# Patient Record
Sex: Female | Born: 1950 | Race: Black or African American | Hispanic: No | Marital: Married | Smoking: Former smoker
Health system: Southern US, Community
[De-identification: ages and names within clinical notes are randomized; demographics above are authoritative.]

## PROBLEM LIST (undated history)

## (undated) DIAGNOSIS — G4730 Sleep apnea, unspecified: Secondary | ICD-10-CM

## (undated) DIAGNOSIS — J449 Chronic obstructive pulmonary disease, unspecified: Secondary | ICD-10-CM

## (undated) DIAGNOSIS — K449 Diaphragmatic hernia without obstruction or gangrene: Secondary | ICD-10-CM

## (undated) DIAGNOSIS — Z8719 Personal history of other diseases of the digestive system: Secondary | ICD-10-CM

## (undated) DIAGNOSIS — H539 Unspecified visual disturbance: Secondary | ICD-10-CM

## (undated) DIAGNOSIS — J309 Allergic rhinitis, unspecified: Secondary | ICD-10-CM

## (undated) DIAGNOSIS — F32A Depression, unspecified: Secondary | ICD-10-CM

## (undated) DIAGNOSIS — K5790 Diverticulosis of intestine, part unspecified, without perforation or abscess without bleeding: Secondary | ICD-10-CM

## (undated) DIAGNOSIS — D126 Benign neoplasm of colon, unspecified: Secondary | ICD-10-CM

## (undated) DIAGNOSIS — F419 Anxiety disorder, unspecified: Secondary | ICD-10-CM

## (undated) DIAGNOSIS — J330 Polyp of nasal cavity: Secondary | ICD-10-CM

## (undated) DIAGNOSIS — M1990 Unspecified osteoarthritis, unspecified site: Secondary | ICD-10-CM

## (undated) DIAGNOSIS — F329 Major depressive disorder, single episode, unspecified: Secondary | ICD-10-CM

## (undated) DIAGNOSIS — K219 Gastro-esophageal reflux disease without esophagitis: Secondary | ICD-10-CM

## (undated) DIAGNOSIS — Z9889 Other specified postprocedural states: Secondary | ICD-10-CM

## (undated) DIAGNOSIS — J45909 Unspecified asthma, uncomplicated: Secondary | ICD-10-CM

## (undated) DIAGNOSIS — J329 Chronic sinusitis, unspecified: Secondary | ICD-10-CM

## (undated) DIAGNOSIS — R112 Nausea with vomiting, unspecified: Secondary | ICD-10-CM

## (undated) DIAGNOSIS — T7840XA Allergy, unspecified, initial encounter: Secondary | ICD-10-CM

## (undated) HISTORY — PX: COLONOSCOPY: SHX174

## (undated) HISTORY — DX: Allergy, unspecified, initial encounter: T78.40XA

## (undated) HISTORY — DX: Depression, unspecified: F32.A

## (undated) HISTORY — PX: NASAL POLYP SURGERY: SHX186

## (undated) HISTORY — PX: POLYPECTOMY: SHX149

## (undated) HISTORY — DX: Diaphragmatic hernia without obstruction or gangrene: K44.9

## (undated) HISTORY — DX: Other specified postprocedural states: Z98.890

## (undated) HISTORY — DX: Major depressive disorder, single episode, unspecified: F32.9

## (undated) HISTORY — DX: Diverticulosis of intestine, part unspecified, without perforation or abscess without bleeding: K57.90

## (undated) HISTORY — DX: Unspecified asthma, uncomplicated: J45.909

## (undated) HISTORY — DX: Allergic rhinitis, unspecified: J30.9

## (undated) HISTORY — DX: Personal history of other diseases of the digestive system: Z87.19

## (undated) HISTORY — DX: Chronic obstructive pulmonary disease, unspecified: J44.9

## (undated) HISTORY — DX: Unspecified visual disturbance: H53.9

## (undated) HISTORY — DX: Benign neoplasm of colon, unspecified: D12.6

## (undated) HISTORY — PX: TONSILLECTOMY: SUR1361

## (undated) HISTORY — PX: BUNIONECTOMY: SHX129

## (undated) HISTORY — DX: Polyp of nasal cavity: J33.0

## (undated) HISTORY — DX: Chronic sinusitis, unspecified: J32.9

## (undated) HISTORY — PX: COLONOSCOPY W/ BIOPSIES AND POLYPECTOMY: SHX1376

## (undated) HISTORY — DX: Sleep apnea, unspecified: G47.30

## (undated) SURGERY — Surgical Case
Anesthesia: *Unknown

---

## 1996-06-18 ENCOUNTER — Encounter: Payer: Self-pay | Admitting: Gastroenterology

## 1996-08-07 ENCOUNTER — Encounter: Payer: Self-pay | Admitting: Gastroenterology

## 1997-12-15 ENCOUNTER — Emergency Department (HOSPITAL_COMMUNITY): Admission: EM | Admit: 1997-12-15 | Discharge: 1997-12-15 | Payer: Self-pay | Admitting: Emergency Medicine

## 1997-12-16 ENCOUNTER — Emergency Department (HOSPITAL_COMMUNITY): Admission: EM | Admit: 1997-12-16 | Discharge: 1997-12-16 | Payer: Self-pay | Admitting: Internal Medicine

## 1998-07-25 ENCOUNTER — Emergency Department (HOSPITAL_COMMUNITY): Admission: EM | Admit: 1998-07-25 | Discharge: 1998-07-25 | Payer: Self-pay | Admitting: Emergency Medicine

## 1998-07-25 ENCOUNTER — Encounter: Payer: Self-pay | Admitting: Emergency Medicine

## 1999-10-20 ENCOUNTER — Other Ambulatory Visit: Admission: RE | Admit: 1999-10-20 | Discharge: 1999-10-20 | Payer: Self-pay | Admitting: Obstetrics and Gynecology

## 2000-10-25 ENCOUNTER — Other Ambulatory Visit: Admission: RE | Admit: 2000-10-25 | Discharge: 2000-10-25 | Payer: Self-pay | Admitting: Obstetrics and Gynecology

## 2003-11-03 ENCOUNTER — Emergency Department (HOSPITAL_COMMUNITY): Admission: EM | Admit: 2003-11-03 | Discharge: 2003-11-03 | Payer: Self-pay | Admitting: Emergency Medicine

## 2004-08-03 ENCOUNTER — Ambulatory Visit: Payer: Self-pay | Admitting: Pulmonary Disease

## 2004-08-24 ENCOUNTER — Ambulatory Visit: Payer: Self-pay | Admitting: Pulmonary Disease

## 2004-09-24 ENCOUNTER — Ambulatory Visit: Payer: Self-pay | Admitting: Pulmonary Disease

## 2004-11-02 ENCOUNTER — Ambulatory Visit: Payer: Self-pay | Admitting: Pulmonary Disease

## 2005-01-06 ENCOUNTER — Ambulatory Visit: Payer: Self-pay | Admitting: Pulmonary Disease

## 2005-02-03 ENCOUNTER — Ambulatory Visit: Payer: Self-pay | Admitting: Internal Medicine

## 2005-03-05 ENCOUNTER — Ambulatory Visit: Payer: Self-pay | Admitting: Internal Medicine

## 2005-03-05 ENCOUNTER — Ambulatory Visit: Payer: Self-pay | Admitting: Pulmonary Disease

## 2005-04-02 ENCOUNTER — Ambulatory Visit: Payer: Self-pay | Admitting: Pulmonary Disease

## 2005-05-03 ENCOUNTER — Ambulatory Visit: Payer: Self-pay | Admitting: Pulmonary Disease

## 2005-06-03 ENCOUNTER — Ambulatory Visit: Payer: Self-pay | Admitting: Pulmonary Disease

## 2005-07-05 ENCOUNTER — Ambulatory Visit: Payer: Self-pay | Admitting: Emergency Medicine

## 2005-07-20 ENCOUNTER — Ambulatory Visit: Payer: Self-pay | Admitting: Pulmonary Disease

## 2005-08-11 ENCOUNTER — Ambulatory Visit: Payer: Self-pay | Admitting: Emergency Medicine

## 2005-09-08 ENCOUNTER — Ambulatory Visit: Payer: Self-pay | Admitting: Pulmonary Disease

## 2005-09-30 ENCOUNTER — Ambulatory Visit: Payer: Self-pay | Admitting: Pulmonary Disease

## 2005-10-08 ENCOUNTER — Ambulatory Visit: Payer: Self-pay | Admitting: Internal Medicine

## 2005-11-05 ENCOUNTER — Ambulatory Visit: Payer: Self-pay | Admitting: Critical Care Medicine

## 2005-12-06 ENCOUNTER — Ambulatory Visit: Payer: Self-pay | Admitting: Emergency Medicine

## 2006-01-05 ENCOUNTER — Ambulatory Visit: Payer: Self-pay | Admitting: Pulmonary Disease

## 2006-02-07 ENCOUNTER — Ambulatory Visit: Payer: Self-pay | Admitting: Internal Medicine

## 2006-03-09 ENCOUNTER — Ambulatory Visit: Payer: Self-pay | Admitting: Pulmonary Disease

## 2006-04-08 ENCOUNTER — Ambulatory Visit: Payer: Self-pay | Admitting: Pulmonary Disease

## 2006-05-11 ENCOUNTER — Ambulatory Visit: Payer: Self-pay | Admitting: Emergency Medicine

## 2006-06-13 ENCOUNTER — Ambulatory Visit: Payer: Self-pay | Admitting: Pulmonary Disease

## 2006-07-15 ENCOUNTER — Ambulatory Visit: Payer: Self-pay | Admitting: Pulmonary Disease

## 2006-08-08 ENCOUNTER — Ambulatory Visit: Payer: Self-pay | Admitting: Pulmonary Disease

## 2006-08-23 ENCOUNTER — Ambulatory Visit: Payer: Self-pay | Admitting: Pulmonary Disease

## 2006-09-07 ENCOUNTER — Ambulatory Visit: Payer: Self-pay | Admitting: Pulmonary Disease

## 2006-09-16 ENCOUNTER — Ambulatory Visit: Payer: Self-pay | Admitting: Pulmonary Disease

## 2006-10-10 ENCOUNTER — Ambulatory Visit: Payer: Self-pay | Admitting: Internal Medicine

## 2006-11-10 ENCOUNTER — Ambulatory Visit: Payer: Self-pay | Admitting: Internal Medicine

## 2007-01-09 ENCOUNTER — Ambulatory Visit: Payer: Self-pay | Admitting: Internal Medicine

## 2007-04-10 DIAGNOSIS — J301 Allergic rhinitis due to pollen: Secondary | ICD-10-CM | POA: Insufficient documentation

## 2007-04-10 DIAGNOSIS — J45909 Unspecified asthma, uncomplicated: Secondary | ICD-10-CM | POA: Insufficient documentation

## 2007-04-10 DIAGNOSIS — Z886 Allergy status to analgesic agent status: Secondary | ICD-10-CM

## 2007-04-10 DIAGNOSIS — J339 Nasal polyp, unspecified: Secondary | ICD-10-CM | POA: Insufficient documentation

## 2007-04-10 DIAGNOSIS — J329 Chronic sinusitis, unspecified: Secondary | ICD-10-CM | POA: Insufficient documentation

## 2007-04-19 ENCOUNTER — Ambulatory Visit: Payer: Self-pay | Admitting: Internal Medicine

## 2007-05-15 ENCOUNTER — Telehealth: Payer: Self-pay | Admitting: Internal Medicine

## 2007-05-15 ENCOUNTER — Ambulatory Visit: Payer: Self-pay | Admitting: Internal Medicine

## 2007-05-20 ENCOUNTER — Emergency Department (HOSPITAL_COMMUNITY): Admission: EM | Admit: 2007-05-20 | Discharge: 2007-05-21 | Payer: Self-pay | Admitting: Emergency Medicine

## 2007-06-13 ENCOUNTER — Ambulatory Visit: Payer: Self-pay | Admitting: Internal Medicine

## 2007-07-17 ENCOUNTER — Ambulatory Visit: Payer: Self-pay | Admitting: Internal Medicine

## 2007-08-17 ENCOUNTER — Ambulatory Visit: Payer: Self-pay | Admitting: Internal Medicine

## 2007-09-18 ENCOUNTER — Ambulatory Visit: Payer: Self-pay | Admitting: Critical Care Medicine

## 2007-09-19 DIAGNOSIS — J449 Chronic obstructive pulmonary disease, unspecified: Secondary | ICD-10-CM | POA: Insufficient documentation

## 2007-10-11 ENCOUNTER — Ambulatory Visit: Payer: Self-pay | Admitting: Internal Medicine

## 2007-11-02 ENCOUNTER — Telehealth: Payer: Self-pay | Admitting: Internal Medicine

## 2007-11-09 ENCOUNTER — Ambulatory Visit: Payer: Self-pay | Admitting: Internal Medicine

## 2007-12-07 ENCOUNTER — Ambulatory Visit: Payer: Self-pay | Admitting: Internal Medicine

## 2007-12-18 ENCOUNTER — Ambulatory Visit: Payer: Self-pay | Admitting: Internal Medicine

## 2007-12-28 ENCOUNTER — Telehealth (INDEPENDENT_AMBULATORY_CARE_PROVIDER_SITE_OTHER): Payer: Self-pay | Admitting: *Deleted

## 2007-12-29 ENCOUNTER — Ambulatory Visit: Payer: Self-pay | Admitting: Internal Medicine

## 2008-01-02 ENCOUNTER — Telehealth (INDEPENDENT_AMBULATORY_CARE_PROVIDER_SITE_OTHER): Payer: Self-pay | Admitting: *Deleted

## 2008-01-08 ENCOUNTER — Telehealth: Payer: Self-pay | Admitting: Internal Medicine

## 2008-02-01 ENCOUNTER — Ambulatory Visit: Payer: Self-pay | Admitting: Internal Medicine

## 2008-05-14 ENCOUNTER — Ambulatory Visit: Payer: Self-pay | Admitting: Internal Medicine

## 2008-05-14 DIAGNOSIS — K219 Gastro-esophageal reflux disease without esophagitis: Secondary | ICD-10-CM | POA: Insufficient documentation

## 2008-06-05 ENCOUNTER — Telehealth (INDEPENDENT_AMBULATORY_CARE_PROVIDER_SITE_OTHER): Payer: Self-pay | Admitting: *Deleted

## 2008-07-29 ENCOUNTER — Telehealth (INDEPENDENT_AMBULATORY_CARE_PROVIDER_SITE_OTHER): Payer: Self-pay | Admitting: *Deleted

## 2008-07-31 ENCOUNTER — Telehealth: Payer: Self-pay | Admitting: Gastroenterology

## 2008-07-31 ENCOUNTER — Ambulatory Visit: Payer: Self-pay | Admitting: Gastroenterology

## 2008-07-31 DIAGNOSIS — K5900 Constipation, unspecified: Secondary | ICD-10-CM | POA: Insufficient documentation

## 2008-08-02 ENCOUNTER — Ambulatory Visit: Payer: Self-pay | Admitting: Gastroenterology

## 2008-08-12 DIAGNOSIS — D126 Benign neoplasm of colon, unspecified: Secondary | ICD-10-CM

## 2008-08-12 DIAGNOSIS — Z9889 Other specified postprocedural states: Secondary | ICD-10-CM

## 2008-08-12 DIAGNOSIS — Z8719 Personal history of other diseases of the digestive system: Secondary | ICD-10-CM

## 2008-08-12 HISTORY — DX: Other specified postprocedural states: Z98.890

## 2008-08-12 HISTORY — DX: Benign neoplasm of colon, unspecified: D12.6

## 2008-08-12 HISTORY — DX: Personal history of other diseases of the digestive system: Z87.19

## 2008-08-14 ENCOUNTER — Encounter (INDEPENDENT_AMBULATORY_CARE_PROVIDER_SITE_OTHER): Payer: Self-pay | Admitting: *Deleted

## 2008-08-14 ENCOUNTER — Encounter: Payer: Self-pay | Admitting: Gastroenterology

## 2008-08-14 ENCOUNTER — Ambulatory Visit: Payer: Self-pay | Admitting: Gastroenterology

## 2008-08-18 ENCOUNTER — Encounter: Payer: Self-pay | Admitting: Gastroenterology

## 2008-09-11 ENCOUNTER — Ambulatory Visit: Payer: Self-pay | Admitting: Internal Medicine

## 2008-09-30 ENCOUNTER — Telehealth: Payer: Self-pay | Admitting: Gastroenterology

## 2008-10-21 ENCOUNTER — Ambulatory Visit: Payer: Self-pay | Admitting: Gastroenterology

## 2008-10-21 DIAGNOSIS — Z860100 Personal history of colon polyps, unspecified: Secondary | ICD-10-CM | POA: Insufficient documentation

## 2008-10-21 DIAGNOSIS — R1013 Epigastric pain: Secondary | ICD-10-CM | POA: Insufficient documentation

## 2008-11-12 ENCOUNTER — Telehealth: Payer: Self-pay | Admitting: Gastroenterology

## 2009-03-11 ENCOUNTER — Ambulatory Visit: Payer: Self-pay | Admitting: Internal Medicine

## 2009-03-26 LAB — HM DEXA SCAN: HM Dexa Scan: -2.3

## 2009-03-31 ENCOUNTER — Encounter: Admission: RE | Admit: 2009-03-31 | Discharge: 2009-03-31 | Payer: Self-pay | Admitting: Obstetrics & Gynecology

## 2009-04-30 ENCOUNTER — Ambulatory Visit: Payer: Self-pay | Admitting: Internal Medicine

## 2009-05-02 ENCOUNTER — Telehealth (INDEPENDENT_AMBULATORY_CARE_PROVIDER_SITE_OTHER): Payer: Self-pay | Admitting: *Deleted

## 2009-06-02 ENCOUNTER — Telehealth (INDEPENDENT_AMBULATORY_CARE_PROVIDER_SITE_OTHER): Payer: Self-pay | Admitting: *Deleted

## 2009-06-02 ENCOUNTER — Ambulatory Visit: Payer: Self-pay | Admitting: Internal Medicine

## 2009-09-17 ENCOUNTER — Ambulatory Visit: Payer: Self-pay | Admitting: Internal Medicine

## 2009-09-17 DIAGNOSIS — J209 Acute bronchitis, unspecified: Secondary | ICD-10-CM | POA: Insufficient documentation

## 2009-09-18 ENCOUNTER — Telehealth: Payer: Self-pay | Admitting: Internal Medicine

## 2009-09-19 ENCOUNTER — Telehealth (INDEPENDENT_AMBULATORY_CARE_PROVIDER_SITE_OTHER): Payer: Self-pay | Admitting: *Deleted

## 2009-09-19 ENCOUNTER — Ambulatory Visit: Payer: Self-pay | Admitting: Internal Medicine

## 2009-09-22 ENCOUNTER — Telehealth: Payer: Self-pay | Admitting: Internal Medicine

## 2009-10-23 ENCOUNTER — Telehealth: Payer: Self-pay | Admitting: Internal Medicine

## 2009-10-30 ENCOUNTER — Ambulatory Visit: Payer: Self-pay | Admitting: Internal Medicine

## 2009-11-17 ENCOUNTER — Telehealth (INDEPENDENT_AMBULATORY_CARE_PROVIDER_SITE_OTHER): Payer: Self-pay | Admitting: *Deleted

## 2010-03-02 ENCOUNTER — Telehealth: Payer: Self-pay | Admitting: Internal Medicine

## 2010-03-03 ENCOUNTER — Ambulatory Visit: Payer: Self-pay | Admitting: Internal Medicine

## 2010-03-13 ENCOUNTER — Telehealth: Payer: Self-pay | Admitting: Gastroenterology

## 2010-04-09 ENCOUNTER — Telehealth: Payer: Self-pay | Admitting: Gastroenterology

## 2010-04-13 ENCOUNTER — Telehealth: Payer: Self-pay | Admitting: Gastroenterology

## 2010-04-17 ENCOUNTER — Telehealth: Payer: Self-pay | Admitting: Gastroenterology

## 2010-04-17 ENCOUNTER — Ambulatory Visit: Payer: Self-pay | Admitting: Gastroenterology

## 2010-04-17 DIAGNOSIS — K222 Esophageal obstruction: Secondary | ICD-10-CM | POA: Insufficient documentation

## 2010-04-17 DIAGNOSIS — R079 Chest pain, unspecified: Secondary | ICD-10-CM | POA: Insufficient documentation

## 2010-04-17 DIAGNOSIS — J45909 Unspecified asthma, uncomplicated: Secondary | ICD-10-CM | POA: Insufficient documentation

## 2010-04-24 ENCOUNTER — Telehealth: Payer: Self-pay | Admitting: Physician Assistant

## 2010-05-04 ENCOUNTER — Telehealth: Payer: Self-pay | Admitting: Physician Assistant

## 2010-05-11 ENCOUNTER — Ambulatory Visit: Payer: Self-pay | Admitting: Gastroenterology

## 2010-05-25 ENCOUNTER — Telehealth (INDEPENDENT_AMBULATORY_CARE_PROVIDER_SITE_OTHER): Payer: Self-pay | Admitting: *Deleted

## 2010-05-25 ENCOUNTER — Telehealth: Payer: Self-pay | Admitting: Internal Medicine

## 2010-06-01 ENCOUNTER — Telehealth: Payer: Self-pay | Admitting: Gastroenterology

## 2010-06-03 ENCOUNTER — Telehealth: Payer: Self-pay | Admitting: Internal Medicine

## 2010-07-04 ENCOUNTER — Encounter: Payer: Self-pay | Admitting: Gastroenterology

## 2010-07-06 ENCOUNTER — Encounter: Payer: Self-pay | Admitting: Gastroenterology

## 2010-07-14 ENCOUNTER — Telehealth (INDEPENDENT_AMBULATORY_CARE_PROVIDER_SITE_OTHER): Payer: Self-pay | Admitting: *Deleted

## 2010-07-14 NOTE — Progress Notes (Signed)
Summary: EKG report?   Phone Note Call from Patient Call back at Work Phone [phone]   Caller: Patient Call For: Dr. Russella Dar Reason for Call: Talk to Nurse Summary of Call: pt has had an EKG and wants to know if Dr. Russella Dar would like a copy of the report Initial call taken by: Vallarie Mare,  April 13, 2010 10:21 AM  Follow-up for Phone Call        patient will brink the EKG report to the appointment with Dr Russella Dar on 05/11/10.  She did cancel her appointment that was scheduled for today.  She made some dietary changes and feels much better.   Follow-up by: Darcey Nora RN, CGRN,  April 13, 2010 10:39 AM

## 2010-07-14 NOTE — Progress Notes (Signed)
Summary: authorization  Phone Note Call from Patient Call back at [phone] ex [phone]   Caller: Patient Call For: Kol Consuegra Summary of Call: calling about authorization for prednisone Initial call taken by: Rickard Patience,  Oct 23, 2009 10:34 AM  Follow-up for Phone Call        Pt states she needs a refill on prednisone 5mg  tablets she is to be on this daily according to last OV note.Refill sent. pt ahs appt on 10-30-09. [facility] on [address]    Prescriptions: PREDNISONE 5 MG TABS (PREDNISONE) 1 tablet by mouth once daily  #100 Tablet x 1   Entered by:   [name] CMA   Authorized by:   Waymon Budge MD   Signed by:   [name] CMA on 10/23/2009   Method used:   [facility]  [address] [id number]       [address]       [place], [place]  [address]       Ph: [phone] or [phone]       Fax: [phone]   RxID:   [id number]

## 2010-07-14 NOTE — Progress Notes (Signed)
Summary: [place] appt.   Phone Note Call from Patient Call back at Work Phone [phone] Call back at [phone], [phone]   Caller: Patient Call For: Dr. Russella Dar Reason for Call: Talk to Nurse Details for Reason: Ultrasound of Gallbladder Summary of Call: Pt. said she had an ultrasound of her gallbladder scheduled for today but cannot find any paperwork about it.  Can you please call her and let her know all the particulars?  Thank you. Initial call taken by: Schuyler Amor,  May 04, 2010 9:46 AM  Follow-up for Phone Call        Had a conversation with the pt and she has had a good weekend without any pain.  She thinks she wants  to cancel the [place].  She has an appt with Dr. Russella Dar on 05-11-10 which she decided to keep.  Amy does want her to follow up with her primary GI MD.  Additional Follow-up for Phone Call Additional follow up Details #1::        OK, JUST BE SURE SHE FOLLOWS UP WITH DR. Russella Dar ON THE 28TH Additional Follow-up by: Sammuel Cooper PA-c,  May 05, 2010 10:48 AM

## 2010-07-14 NOTE — Progress Notes (Signed)
Summary: breathing treatment   Phone Note Call from Patient Call back at [phone] ex [phone]   Caller: Patient Call For: young Summary of Call: pt would like to come in today to get another breathing treatment Initial call taken by: Rickard Patience,  September 19, 2009 8:15 AM  Follow-up for Phone Call        called above number - [facility] Crystal Yetta Barre RN  September 19, 2009 8:50 AM  Pt was seen 2 days ago and given neb, depo, and tussionex.  Spoke with pt.  Pt states she is no better-still having chest congestion, prod cough with "deep green" mucus tightness in chest and "raw throat."  Pt denies SOB and wheezing.  States she took 1 dose of tussionex yesterday but states she did not take it again because she didn't see where it helped.  Still taking pred and Keflex and restarted Mucinex yesterday.  Advised pt that it can take a few days to see an improvement.  Pt requesting to come in today for another breathing treatment.  Will forward to CY for his recs - would you like pt to come in today for another breathing tx?   Follow-up by: Gweneth Dimitri RN,  September 19, 2009 9:00 AM  Additional Follow-up for Phone Call Additional follow up Details #1::        Please use 330 slot for pt to come back in.Reynaldo Minium CMA  September 19, 2009 9:14 AM     Additional Follow-up for Phone Call Additional follow up Details #2::    Pt aware to come in today at 330 to see CY - she verbalized understanding. Follow-up by: Gweneth Dimitri RN,  September 19, 2009 9:20 AM

## 2010-07-14 NOTE — Progress Notes (Signed)
Summary: Medication  Medications Added RANITIDINE HCL 300 MG TABS (RANITIDINE HCL) one tablet by mouth two times a day       Phone Note Call from Patient Call back at Home Phone [phone]   Caller: Patient Call For: Dr. Russella Dar Reason for Call: Refill Medication Summary of Call: Needs a refill on her Zantac.[facility] [place]  [phone] Initial call taken by: Karna Christmas,  March 13, 2010 8:32 AM  Follow-up for Phone Call        Left a message and told pt to schedule her yearly follow-up before any further refills can be called in to her pharmacy. Told her when she schedules her REV she can get one refill until her appt but she has not been seen since 10/2008. Follow-up by: Christie Nottingham CMA Duncan Dull),  March 13, 2010 8:42 AM  Additional Follow-up for Phone Call Additional follow up Details #1::        Told pt that we will send this prescription to her pharmacy but we will call her in a few weeks to schedule her for a appt b/c our schedule is full until the end of November. Additional Follow-up by: Christie Nottingham CMA Duncan Dull),  March 13, 2010 4:06 PM    New/Updated Medications: RANITIDINE HCL 300 MG TABS (RANITIDINE HCL) one tablet by mouth two times a day Prescriptions: RANITIDINE HCL 300 MG TABS (RANITIDINE HCL) one tablet by mouth two times a day  #60 x 1   Entered by:   Christie Nottingham CMA (AAMA)   Authorized by:   Meryl Dare MD [facility]   Signed by:   Christie Nottingham CMA (AAMA) on 03/13/2010   Method used:   [facility]        [facility]  [address] [id number]       [address]       [place], [place]  [address]       Ph: [phone] or [phone]       Fax: [phone]   RxID:   [id number]

## 2010-07-14 NOTE — Progress Notes (Signed)
Summary: Triage   Phone Note Call from Patient Call back at [phone] x [phone]   Caller: Patient Call For: Dr. Russella Dar Reason for Call: Talk to Nurse Summary of Call: Has appt. 05-11-10 having tight chest discomfort. "feels like I can't breath"  Initial call taken by: Karna Christmas,  April 09, 2010 9:02 AM  Follow-up for Phone Call        Patient c/o chest pain "tightness" that takes her breath.  She discribes the pain as a squeeze.  It is not relieved by anything but time.  She has been taking hyoscyamine and this alos does not relieve her pain.  She says when the pain starts it can last for hours.  She says it is worse when she lies down at night.  I have asked her to see her primary or Urgent care today.  She is given an appointment for Monday  04/13/10 2:30 with Dr Russella Dar .  Patient  states she had cardiac causes of her chest pain ruled out 2 years ago and this feeling is very simular to that time.  I have reviewed again with her an antireflux diet and urged her to get checked by her primary care or urgent care today.  The above is reviewed with Dr Russella Dar and he agrees. Follow-up by: Darcey Nora RN, CGRN,  April 09, 2010 9:57 AM

## 2010-07-14 NOTE — Progress Notes (Signed)
Summary: returned call-katie- pt called again-rx request  Phone Note Call from Patient   Caller: Patient Call For: young Summary of Call: pt returned call from [place]. call [phone] x [phone] Initial call taken by: Tivis Ringer, CNA,  September 22, 2009 3:58 PM  Follow-up for Phone Call        PT REQUESTS A RX FOR YEAST INFECTION (DUE TO THE ABX SHE HAS TAKEN. [facility] [address] PT # [id number] [phone] [id number]. Tivis Ringer, CNA  September 25, 2009 9:15 AM  Additional Follow-up for Phone Call Additional follow up Details #1::        Done Additional Follow-up by: Waymon Budge MD,  September 25, 2009 2:47 PM    New/Updated Medications: FLUCONAZOLE 150 MG TABS (FLUCONAZOLE) 1 daily Prescriptions: FLUCONAZOLE 150 MG TABS (FLUCONAZOLE) 1 daily  #7 x 0   Entered and Authorized by:   Waymon Budge MD   Signed by:   Waymon Budge MD on 09/25/2009   Method used:   [facility]        [facility]  [address] [id number]       [address]       [place], [place]  [address]       Ph: [phone] or [phone]       Fax: [phone]   RxID:   [id number]

## 2010-07-14 NOTE — Assessment & Plan Note (Signed)
Summary: CONGESTION/ MBW   Primary Provider/Referring Provider:  Billee Cashing, MD  CC:  Accute Visit-congestion in chest and scratchy throat on Sundy; started taking Mucinex D, Increased Prednisone, and and abx..  History of Present Illness: April 30, 2009-- c/o increased wheezing x 2 weeks.  Started as nasal congestion - saw ENT.  Was given cephalexin for ? sinusitis - completed 10 day coarse 11/14. Also increased prednisone to 20mg  dialy (from 5mg  daily maintenance). Was down to 10mg  daily but had increased wheezing and SOB as she tapered prednisone. Also c/o increased indegestion.    June 02, 2009- Rhinitis (Dr [place] Callas), rhinosinusitis, nasal polyps, asthma/ failed Xolair Had GOT without problems Dec 7 for bunion surgery. Did well after seeing NP here in November. She took up to 40 mg prednisone at that visit, and got down to 5 mg daily until 2 days ago, when she again had to burst ot 40 x 2 days and 30 mg today. Denies obvious infection- blames cold air. She took Xolair for 3 years with no benefit and no prednisone reduction. Dr Lazarus Salines has her using saline lavage daily and status of sinuses seems related to hefr asthma. Bone density said to be stable, so her Gyn took her off Prempro.  Inhaled cortisones never held her.  September 17, 2009-Rhinitis (Dr [place] Callas), rhinosinusitis, nasal polyps, asthma/ failed xolair 4 days acute illness began with scratchy throat. Now throat hurts to cough, rattling cough. denies fever, glands, dysphagia, head congestion or sneeze. No sick exposure. She increased prednisone from 5mg  daily maint to 30 mg,2days ago, 40 mg yesterday. Added Mucinex D and keflex/ day 3   Current Medications (verified): 1)  Ventolin Hfa 108 (90 Base) Mcg/act  Aers (Albuterol Sulfate) .... 2 Puffs Every 4hr As Needed Wheeizng 2)  Prednisone 5 Mg Tabs (Prednisone) .Marland Kitchen.. 1 Tablet By Mouth Once Daily 3)  Ranitidine Hcl 300 Mg Tabs (Ranitidine Hcl) .... One Tablet By Mouth Two  Times A Day 4)  Prednisone 10 Mg Tabs (Prednisone) .... 4 Tabs By Mouth Daily For 2 Days Then 3 Daily For 2 Days Then 2 Tabs Daily For 2 Days Then 1 Tab Daily For 2 Days Then 5mg  By Mouth Daily  Allergies (verified): 1)  Aspirin  Past History:  Past Medical History: Last updated: 10/21/2008 NASAL POLYP (ICD-471.0) EXTRINSIC ASTHMA, UNSPECIFIED (ICD-493.00) Stopped Xolair, steroid dependent ALLERGIC RHINITIS (ICD-477.9)  Failed allergy vaccine.  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 Pt is active Alcohol Use - no  Risk Factors: Exercise: no (07/31/2008)  Risk Factors: Smoking Status: quit (07/31/2008)  Review of Systems      See HPI       The patient complains of prolonged cough.  The patient denies anorexia, fever, weight loss, weight gain, vision loss, decreased hearing, hoarseness, chest pain, syncope, dyspnea on exertion, peripheral edema, hemoptysis, abdominal pain, and severe indigestion/heartburn.    Vital Signs:  Patient profile:   60 year old female Height:      69 inches Weight:      185.50 pounds BMI:     27.49 O2 Sat:      98 % on  Room air Pulse rate:   83 / minute BP sitting:   138 / 94  (left arm) Cuff size:   regular  Vitals Entered By: Reynaldo Minium CMA (September 17, 2009 9:03 AM)  O2 Flow:  Room air  Physical Exam  Additional Exam:  General: A/Ox3; pleasant and cooperative, NAD, SKIN: no rash, lesions HEENT: [place]/AT, EOM- WNL, Conjuctivae- clear, PERRLA, TM-WNL,, Mallampati II,   minor redness NECK: Supple w/ fair ROM, JVD- none, normal carotid impulses w/o bruits  CHEST:diminished bases, deep rattling cough upper chest. ABDOMEN: soft, nt, nd , +bs ZOX:WRUE, nl pulses, no edema  NEURO: Grossly intact to observation      Impression & Recommendations:  Problem # 1:  TRACHEOBRONCHITIS, ACUTE (ICD-466.0)  Acute tracheobronchitis, probably viral, in this prednisone dependent woman. So far wheezing is not significant. She is on reasonable meds. Cough hurts. She says neb treatment last time gave her a panic attack, but she likes depo. The following medications were removed from the medication list:    Symbicort 160-4.5 Mcg/act Aero (Budesonide-formoterol fumarate) .Marland Kitchen... 2 puffs and rinse, twice daily Her updated medication list for this problem includes:    Ventolin Hfa 108 (90 Base) Mcg/act Aers (Albuterol sulfate) .Marland Kitchen... 2 puffs every 4hr as needed wheeizng    Mucinex D 60-600 Mg Xr12h-tab (Pseudoephedrine-guaifenesin) .Marland Kitchen... 1 by mouth two times a day    Clarithromycin 500 Mg Tabs (Clarithromycin) .Marland Kitchen... 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 1 teaspoon two times a day as needed cough  Other Orders: Est. Patient Level III (45409) Admin of Therapeutic Inj  intramuscular or subcutaneous (81191) Nebulizer Tx (47829)  Patient Instructions: 1)  Keep appointmnet For late April 2)  neb xop 0.63 3)  depo 80 4)  script for tussionex printed 5)  Finish a week of Keflex. 6)  Taper prednisone back down as you are able til return to 5 mg daily Prescriptions: TUSSIONEX PENNKINETIC ER 8-10 MG/5ML LQCR (CHLORPHENIRAMINE-HYDROCODONE) 1 teaspoon two times a day as needed cough  #200 ml x 0   Entered and Authorized by:   Waymon Budge MD   Signed by:   Waymon Budge MD on  09/17/2009   Method used:   Print then Give to Patient   RxID:   [id number]      Medication Administration  Injection # 1:    Medication: Depo- Medrol 80mg     Diagnosis: TRACHEOBRONCHITIS, ACUTE (ICD-466.0)    Route: SQ    Site: RUOQ gluteus    Exp Date: 04/2012    Lot #: [id number]    Mfr: Pharmacia    Patient tolerated injection without complications    Given by: Reynaldo Minium CMA (September 17, 2009 9:35 AM)  Medication # 1:    Medication: Xopenex 0.63mg     Diagnosis: TRACHEOBRONCHITIS, ACUTE (ICD-466.0)    Dose: 1 vial    Route: inhaled    Exp Date: 09/2009    Lot #: [id number]    Mfr: Sepracor    Patient tolerated medication without complications    Given by: Reynaldo Minium CMA (September 17, 2009 9:36 AM)  Orders Added: 1)  Est. Patient Level III [24401] 2)  Admin of Therapeutic Inj  intramuscular or subcutaneous [96372] 3)  Nebulizer Tx [02725]

## 2010-07-14 NOTE — Progress Notes (Signed)
Summary: chest tightness   Phone Note Call from Patient Call back at Work Phone [phone]   Caller: Patient Call For: Dr. Russella Dar Reason for Call: Talk to Nurse Summary of Call: after taking Gas-X last night, pt experienced tightness in chest and still this morning Initial call taken by: Vallarie Mare,  April 17, 2010 8:24 AM  Follow-up for Phone Call        Patient  c/o chest pain.  See previous phone notes.  Patient  will come in this am and see Mike Gip PA at 10:30 Follow-up by: Darcey Nora RN, CGRN,  April 17, 2010 8:59 AM

## 2010-07-14 NOTE — Assessment & Plan Note (Signed)
Summary: rov/jd   Primary Provider/Referring Provider:  Billee Cashing, MD  CC:  4 mth rov - c/o sinus drainage and ha's for 4 days - received a "shot" on 02-26-10 for ha's at urgent care - started taking Cephalexin 500mg  qid on 02-26-10.  History of Present Illness:  September 17, 2009-Rhinitis (Dr [place] Callas), rhinosinusitis, nasal polyps, asthma/ failed xolair 4 days acute illness began with scratchy throat. Now throat hurts to cough, rattling cough. denies fever, glands, dysphagia, head congestion or sneeze. No sick exposure. She increased prednisone from 5mg  daily maint to 30 mg,2days ago, 40 mg yesterday. Added Mucinex D and keflex/ day 3  September 19, 2009 - Rhinitis (Dr [place] Callas), rhinosinusitis, nasal polyps, asthma/ failed xolair She returns not feeling better after treatment last visit. Still wheeze and morning chest congestion. Cough yellow green, wheeze. No fever or blood, chest pain, palpitation or ankle edema. She has extended her course of Keflex.Still on prednisone 40 mg daily.  Oct 30, 2009-  Rhinitis(Dr [place] Callas), rhinosinusitis, nasal polyps, asthma/ failed Xolair Asks what to do for occasional scratchy throat with nothing else.- Suggested conservative care. Still taking prednisone 5 mg daily maintenance. We talked again today about steroid side effects and goals.. Denies cough, wheeze or sneeze, chest pain or palpitation, bloody or purulent discharge, nodes, fever or swelling feet.  March 03, 2010- Rhiniits (Dr  Callas), rhinosinusitis, nasal polyps, asthma. failed Xolair 4 days of increased rhinorhea, yellow, bitemporal headache. Denies wheeze, cough, fever,  sore throat or itching. Continues daily prednisone, 1/2 x 10 mg tab daily. This has definitely reduced exacerbations.  Asthma History    Initial Asthma Severity Rating:    Age range: 12+ years    Symptoms: 0-2 days/week    Nighttime Awakenings: 0-2/month    Interferes w/ normal activity: no limitations    SABA use (not for  EIB): 0-2 days/week    Asthma Severity Assessment: Intermittent   Preventive Screening-Counseling & Management  Alcohol-Tobacco     Smoking Status: quit > 6 months     Year Quit: 1990  Current Medications (verified): 1)  Ventolin Hfa 108 (90 Base) Mcg/act  Aers (Albuterol Sulfate) .... 2 Puffs Every 4hr As Needed Wheeizng 2)  Prednisone 5 Mg Tabs (Prednisone) .Marland Kitchen.. 1 Tablet By Mouth Once Daily 3)  Cephalexin 500 Mg Caps (Cephalexin) .... Take One By Mouth 4 Times A Day Until Gone  Allergies (verified): 1)  Aspirin  Comments:  Nurse/Medical Assistant: The patient's medications and allergies were reviewed with the patient and were updated in the Medication and Allergy Lists.  Past History:  Past Medical History: Last updated: 10/21/2008 NASAL POLYP (ICD-471.0) EXTRINSIC ASTHMA, UNSPECIFIED (ICD-493.00) Stopped Xolair, steroid dependent ALLERGIC RHINITIS (ICD-477.9)  Failed allergy vaccine.  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 Pt is active Alcohol Use - no  Risk Factors: Exercise: no (07/31/2008)  Risk Factors: Smoking Status: quit > 6 months (03/03/2010)  Social History: Smoking Status:  quit > 6 months  Review of Systems      See HPI       The patient complains of headaches, nasal congestion/difficulty breathing through nose, sneezing, and change in  color of mucus.  The patient denies shortness of breath with activity, shortness of breath at rest, productive cough, non-productive cough,  coughing up blood, chest pain, irregular heartbeats, acid heartburn, indigestion, loss of appetite, weight change, abdominal pain, difficulty swallowing, sore throat, tooth/dental problems, itching, ear ache, hand/feet swelling, joint stiffness or pain, and fever.    Vital Signs:  Patient profile:   60 year old female Height:      69 inches Weight:      174.13 pounds O2 Sat:      96 % on Room air Pulse rate:   99 / minute BP sitting:   120 / 84  (left arm) Cuff size:   regular  Vitals Entered By: Abigail Miyamoto RN (March 03, 2010 3:15 PM)  O2 Flow:  Room air  Physical Exam  Additional Exam:  General: A/Ox3; pleasant and cooperative, NAD, SKIN: no rash, lesions HEENT: [place]/AT, EOM- WNL, Conjuctivae- clear, PERRLA, TM-WNL, no JVD,. Mallampati  II, red at tonsillar crypts NECK: Supple w/ fair ROM, JVD- none, normal carotid impulses w/o bruits , no stridor CHEST:Clear, no cough or wheeze, cough. Not labored, no dullness.. 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 I favor this being an infection, though distinction from allergy is difficult. Suggesting nasal saline and she prefers to finish cephalexin, 500 mg four times a day begun 4 days ago. They also gave ? demerol shot for headache. We will give neb and depo. Continues daily sinus rinse with a water pic. Failed to relieve headache with ES Tylenol and Sudafed. .  Problem # 2:  ALLERGIC RHINITIS (ICD-477.9) Considerations as above. Orders: Est. Patient Level III (84696)  Problem # 3:  EXTRINSIC ASTHMA, UNSPECIFIED (ICD-493.00) She had failed Xolair. Small dose maintenance prednisone has significantly reduced exacerbations by her judgement. We have again reviewed steroid side effects and discussed avoidance of triggers. We will continue this strategy for  now.  Medications Added to Medication List This Visit: 1)  Prednisone 10 Mg Tabs (Prednisone) .... 1/2  tab daily 2)  Cephalexin 500 Mg Caps (Cephalexin) .... Take one by mouth 4 times a day until gone  Other Orders: Nebulizer Tx (29528) Depo- Medrol 80mg  (J1040) Admin of Therapeutic Inj  intramuscular or subcutaneous (41324)  Patient Instructions: 1)  Please schedule a follow-up appointment in 6 months. 2)  Neb neo nasal 3)  Depo 80 4)  finish Cephalexin, continue nasal rinsing 5)  Consider mucinex to thin secretions so they clear out of your sinuses easier. Prescriptions: PREDNISONE 10 MG TABS (PREDNISONE) 1/2  tab daily  #100 x 2   Entered and Authorized by:   Waymon Budge MD   Signed by:   Waymon Budge MD on 03/03/2010   Method used:   Print then Give to Patient   RxID:   [id number] PREDNISONE 10 MG TABS (PREDNISONE) 1/2  tab daily  #15 x prn   Entered and Authorized by:   Waymon Budge MD   Signed by:   Waymon Budge MD on 03/03/2010   Method used:   Print then Give to Patient   RxID:   [id number] PREDNISONE 5 MG TABS (PREDNISONE) 1 tablet by mouth once daily  #100 Tablet x 1   Entered and Authorized by:   Waymon Budge MD   Signed  by:   Waymon Budge MD on 03/03/2010   Method used:   Print then Give to Patient   RxID:   [id number]      Medication Administration  Injection # 1:    Medication: Depo- Medrol 80mg     Diagnosis: RHINOSINUSITIS, 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 Patient Level III [72536] 2)  Nebulizer Tx [64403] 3)  Depo- Medrol 80mg  [J1040] 4)  Admin of Therapeutic Inj  intramuscular or subcutaneous [47425]

## 2010-07-14 NOTE — Assessment & Plan Note (Signed)
Summary: GERD f/u, med refills/all   History of Present Illness Visit Type: Follow-up Visit Primary GI MD: Elie Goody MD [facility] Primary Provider: Billee Cashing, MD Chief Complaint: Pt feels like someone is sitting on her chest after eating and while laying down, pt denies hearburn History of Present Illness:   This is a return office visit for chest pain that she describes as a feeling that someone is sitting on her chest. The symptoms are occasionally related to certain foods, such as fried chicken. She notes an improvement in her symptoms since following antireflux measures and beginning Zegerid. She is not sure that Carafate has helped. She does note occasional relief with hyoscyamine.   GI Review of Systems      Denies abdominal pain, acid reflux, belching, bloating, chest pain, dysphagia with liquids, dysphagia with solids, heartburn, loss of appetite, nausea, vomiting, vomiting blood, weight loss, and  weight gain.        Denies anal fissure, black tarry stools, change in bowel habit, constipation, diarrhea, diverticulosis, fecal incontinence, heme positive stool, hemorrhoids, irritable bowel syndrome, jaundice, light color stool, liver problems, rectal bleeding, and  rectal pain.   Current Medications (verified): 1)  Ventolin Hfa 108 (90 Base) Mcg/act  Aers (Albuterol Sulfate) .... 2 Puffs Every 4hr As Needed Wheeizng 2)  Prednisone 10 Mg Tabs (Prednisone) .... 1/2  Tab Daily 3)  Zegerid Otc 20-1100 Mg Caps (Omeprazole-Sodium Bicarbonate) .... Take 1 Capsule By Mouth Once A Day 4)  Hyoscyamine Sulfate 0.125 Mg Subl (Hyoscyamine Sulfate) .Marland Kitchen.. 1 Sublingually Every 4 Hours As Needed Chest Pain 5)  Carafate 1 Gm Tabs (Sucralfate) .... Take 1 Tablet By Mouth Four Times A Day Before Meals and At Bedtime  Allergies (verified): 1)  Aspirin  Past History:  Past Medical History: Reviewed history from 04/17/2010 and no changes required. NASAL POLYP (ICD-471.0) EXTRINSIC ASTHMA,  UNSPECIFIED (ICD-493.00) Stopped Xolair, steroid dependent ALLERGIC RHINITIS (ICD-477.9)  Failed allergy vaccine.  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 Nasal polypectomy x 3 Tonsillectomy left foot bunion surgery  Family History: Family History of Colon Cancer:Maternal grandfather, unsure age of onset  Social History: Reviewed history from 10/21/2008 and no changes required. 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 Pt is active Alcohol Use - no  Review of Systems       The patient complains of shortness of breath.         The pertinent positives and negatives are noted as above and in the HPI. All other ROS were reviewed and were negative.   Vital Signs:  Patient profile:   60 year old female Height:      69 inches Weight:      165 pounds BMI:     24.45 Pulse rate:   72 / minute Pulse rhythm:   regular BP sitting:   120 / 72  (left arm) Cuff size:   regular  Vitals Entered By: Francee Piccolo CMA Duncan Dull) (May 11, 2010 11:18 AM)  Physical Exam  General:  Well developed, well nourished, no acute distress. Head:  Normocephalic and atraumatic. Eyes:  PERRLA, no icterus. Mouth:  No deformity or lesions, dentition normal. Lungs:  Clear throughout to auscultation. Heart:  Regular rate and rhythm; no murmurs, rubs,  or bruits. Abdomen:  Soft, nontender and nondistended. No  masses, hepatosplenomegaly or hernias noted. Normal bowel sounds. Psych:  Alert and cooperative. Normal mood and affect.  Impression & Recommendations:  Problem # 1:  CHEST PAIN UNSPECIFIED (ICD-786.50) Chest pain. Several features are consistent with reflux symptoms, however the symptoms of heaviness and  shortness of breath are likely related to asthma or another cause. One component of her chest symptoms has improved with treatment for reflux. Increase Zegerid to 20 mg before breakfast and before dinner. Intensify all standard antireflux measures. Discontinue Carafate since it has not been effective.  Problem # 2:  GERD (ICD-530.81) As in problem #1.  Patient Instructions: 1)  Pick up your prescription from your pharmacy.  2)  Please try to discontinue Carafate.  3)  Avoid foods high in acid content ( tomatoes, citrus juices, spicy foods) . Avoid eating within 3 to 4 hours of lying down or before exercising. Do not over eat; try smaller more frequent meals. Elevate head of bed four inches when sleeping.  4)  Copy sent to : Billee Cashing, MD 5)                          Fannie Knee, MD 6)  The medication list was reviewed and reconciled.  All changed / newly prescribed medications were explained.  A complete medication list was provided to the patient / caregiver.  Prescriptions: ZEGERID OTC 20-1100 MG CAPS (OMEPRAZOLE-SODIUM BICARBONATE) one capsule by mouth two times a day before meals  #60 x 11   Entered by:   Christie Nottingham CMA (AAMA)   Authorized by:   Meryl Dare MD [facility]   Signed by:   Christie Nottingham CMA (AAMA) on 05/11/2010   Method used:   [id number]       [address]       [place], [place]  [address]       Ph: [phone]       Fax: [phone]   RxID:   [id number]

## 2010-07-14 NOTE — Progress Notes (Signed)
Summary: still sick  Phone Note Call from Patient Call back at Work Phone [phone] Call back at [phone]   Caller: Patient Call For: Shanea Karney Reason for Call: Talk to Nurse Summary of Call: hoarse and really sore throat, lots of fluid she can't get up.  Got shot yesterday, didn't seem as strong. [facility] Initial call taken by: Eugene Gavia,  September 18, 2009 9:52 AM  Follow-up for Phone Call        PT woke up with bad sorethroat and tightness is throat.  Unable to cough up congestion in throat and chest.  Pt taking Tussionex, Pred 40mg  today and Keflex 4 tabs a day.  Pt stopped Mucinex when started Tussionex.  Pt wants something to help bring up congeston.  Please advise. Abigail Miyamoto RN  September 18, 2009 9:59 AM   Additional Follow-up for Phone Call Additional follow up Details #1::        Ok to restart Mucinex per CDY.Marland KitchenReynaldo Minium CMA  September 18, 2009 11:25 AM   pt advised. [name] CMA  September 18, 2009 11:32 AM

## 2010-07-14 NOTE — Assessment & Plan Note (Signed)
Summary: chest pain/sheri    History of Present Illness Visit Type: Follow-up Visit Primary GI MD: Elie Goody MD [facility] Primary Provider: Billee Cashing, MD Chief Complaint: chest pain non-cardiac History of Present Illness:   60 YO FEMALE KNOWN TO DR. Russella Dar. SHE HAS HX OF GERD,ESOPHAQGEAL STRICTURE, AND COLON POLYPS. SHE COMES IN TODAY WITH C/O CHEST PAINS WHICH  HAVE BEEN PRESENT OVER THE PAST MONTH . SHE HAD BEEN ON RANITIDINE two times a day LONG TERM AND WAS DOING WELL FOR MANY MONTHS SO SHE STOPPED THE RANITIDINE.  SHE STARTED HAVING RECURRENT CHEST PAIN AND WENT BACK ON MED ABOUT A MONTH AGO-THUS FAR WITHOUT ALOT OF IMPROVEMENT. SHE C/O PAIN MOST NOTICEABLE IN THE EARLY MORNING WHEN SHE FIRST GETS UP AND AT NIGHT AFTER LYING DOWN. SHE GETS A TIGHT FEELING LIKE SHE CAN'T GET A GOOD BREATH. THE PAIN WILL BE CONSTANT,BUT VARYING IN INTENSITY. PAIN IS NOT EXERTIONAL OR NECCESSARILY AFFECTED BY EATING. NO C/O DYSPHAGIA OR ODYNOPHAGIA. NO N/V. LEVSIN SL SEEMS TO HELP. SHWE DOES HAVE ASTHMA BUT SAYS THIS IS DIFFERENT., AND SIMILAR TO HER ESOPHAGEAL PROBLEMS PREVIOUSLY.   GI Review of Systems    Reports chest pain.      Denies abdominal pain, acid reflux, belching, bloating, dysphagia with liquids, dysphagia with solids, heartburn, loss of appetite, nausea, vomiting, vomiting blood, weight loss, and  weight gain.        Denies anal fissure, black tarry stools, change in bowel habit, constipation, diarrhea, diverticulosis, fecal incontinence, heme positive stool, hemorrhoids, irritable bowel syndrome, jaundice, light color stool, liver problems, rectal bleeding, and  rectal pain.    Current Medications (verified): 1)  Ventolin Hfa 108 (90 Base) Mcg/act  Aers (Albuterol Sulfate) .... 2 Puffs Every 4hr As Needed Wheeizng 2)  Prednisone 10 Mg Tabs (Prednisone) .... 1/2  Tab Daily 3)  Ranitidine Hcl 300 Mg Tabs (Ranitidine Hcl) .... One Tablet By Mouth Two Times A Day 4)  Hyoscyamine  Sulfate 0.125 Mg Subl (Hyoscyamine Sulfate) .Marland Kitchen.. 1 Sublingually Every 4 Hours As Needed Chest Pain  Allergies (verified): 1)  Aspirin  Past History:  Past Medical History: NASAL POLYP (ICD-471.0) EXTRINSIC ASTHMA, UNSPECIFIED (ICD-493.00) Stopped Xolair, steroid dependent ALLERGIC RHINITIS (ICD-477.9)  Failed allergy vaccine.  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 Nasal polypectomy x 3 Tonsillectomy left foot bunion surgery  Family History: Reviewed history from 07/31/2008 and no changes required. Family History of Colon Cancer:Maternal grandfather  Social History: Reviewed history from 10/21/2008 and no changes required. 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 Pt is active Alcohol Use - no  Review of Systems       The patient complains of shortness of breath.  The patient denies allergy/sinus, anemia, anxiety-new, arthritis/joint pain, back pain, blood in urine, breast changes/lumps, change in vision, confusion, cough, coughing up blood, depression-new, fainting, fatigue, fever, headaches-new, hearing problems, heart murmur, heart rhythm changes, itching, menstrual pain, muscle pains/cramps, night sweats, nosebleeds, pregnancy symptoms, skin rash, sleeping problems, sore throat, swelling of feet/legs, swollen lymph glands, thirst - excessive , urination - excessive , urination changes/pain, urine leakage, vision changes, and voice change.         SEE HPI  Vital Signs:  Patient profile:   60 year old female Height:      69 inches  Weight:      165.6 pounds BMI:     24.54 Pulse rate:   68 / minute Pulse rhythm:   regular BP sitting:   118 / 80  (left arm)  Vitals Entered By: Milford Cage  NCMA (April 17, 2010 10:43 AM)  Physical Exam  General:  Well developed, well nourished, no acute distress. Head:  Normocephalic and atraumatic. Eyes:  PERRLA, no icterus. Lungs:  Clear throughout to auscultation. Heart:  Regular rate and rhythm; no murmurs, rubs,  or bruits. Abdomen:  SOFT, NONTENDER, NO MASS OR HSM,BS+ Rectal:  NOT DONE Extremities:  No clubbing, cyanosis, edema or deformities noted. Neurologic:  Alert and  oriented x4;  grossly normal neurologically. Psych:  Alert and cooperative. Normal mood and affect.   Impression & Recommendations:  Problem # 1:  CHEST PAIN UNSPECIFIED (ICD-786.50) Assessment Deteriorated 59 YO FEMALE WITH ONE MONTH HX OF FAIRLY CONSTANT CHEST PAIN, NONEXERTIONAL . SUSPECT THIS IS SECONDARY TO POORLY CONTROLLED GERD,DISATL ESOPHAGITIS/  ? SPASM  CONTINUE RANITIDINE 300 MG TWICE DAILY-PT SAYS TRIALS OF  PPI'S HAVE BEEN INEFFECTIVE FOR HER IN THE PAST CONTINUE ?REFILL LEVSIN SL FOR as needed USE  ADD CARAFATE SLURRY I GM 4 TIMES DAILY ONE HOUR AFTER MEAKS AND AT BEDTIME X 2 WEEKS SHCEDULE UPPER ABDOMINAL US  TO R/O GB DISEASE. Orders: Ultrasound Abdomen (UAS)  Problem # 2:  ASTHMA UNSPECIFIED (ICD-493.90) Assessment: Comment Only  Problem # 3:  PERSONAL HX COLONIC POLYPS (ICD-V12.72) Assessment: Comment Only UP TO DATE- LAST COLON 3/10  Problem # 4:  ESOPHAGEAL STRICTURE (ICD-530.3) Assessment: Comment Only S/P DILATION 3/10-ASYMPTOMATIC  NOW  Patient Instructions: 1)  We [facility] the [facility] the [facility] to [facility] [place]. 2)  Also sent refill on the Levsin.  3)  We scheduled the Ultrasound at [facility] for Tues 04-21-10. 4)  Directions provided. 5)  GI Reflux brochure given.  6)  Anti Reflux measures information given. 7)  Continue the Ranitidine 300 Mg twice daily. 8)  Copy sent to : Billee Cashing, [place] 9)  The medication list was reviewed and reconciled.  All changed / newly  prescribed medications were explained.  A complete medication list was provided to the patient / caregiver. Prescriptions: CARAFATE 1 GM/10ML SUSP (SUCRALFATE) Carafate Slurry- Take 1 gram an hour after meals and at bedtime  #120 x 0   Entered by:   Lowry Ram NCMA   Authorized by:   Sammuel Cooper PA-c   Signed by:   Lowry Ram NCMA on 04/17/2010   Method used:   [facility] to        [facility] [phone] * (retail)       [address]       [place], [place]  [address]       Ph: [phone] or [phone]       Fax: [phone]   RxID:   [id number] HYOSCYAMINE SULFATE 0.125 MG SUBL (HYOSCYAMINE SULFATE) 1 sublingually every 4 hours as needed chest pain  #60 x 2   Entered by:   Lowry Ram NCMA   Authorized by:   Sammuel Cooper PA-c   Signed by:   Lowry Ram NCMA on 04/17/2010   Method used:   [facility] to        [facility]  [phone] * (retail)       [address]       [place], [place]  [address]  Ph: [phone] or [phone]       Fax: [phone]   RxID:   [id number] CARAFATE 1 GM/10ML SUSP (SUCRALFATE) Carafate Slurry- Take 1 gram an hour after meals and at bedtime  #120 grams x 0   Entered by:   Lowry Ram NCMA   Authorized by:   Sammuel Cooper PA-c   Signed by:   Lowry Ram NCMA on 04/17/2010   Method used:   [facility]        [facility]  [address] [id number]       [address]       [place], [place]  [address]       Ph: [phone] or [phone]       Fax: [phone]   RxID:   [id number]  Carafate prescription in twice. Patient changed her mind on where she wanted [place] to send the medication.

## 2010-07-14 NOTE — Assessment & Plan Note (Signed)
Summary: 4 months/apc   Primary Provider/Referring Provider:  Billee Cashing, MD  CC:  Follow up visit-no complaints; denies SOB and wheezing.Marland Kitchen  History of Present Illness:  September 17, 2009-Rhinitis (Dr [place] Callas), rhinosinusitis, nasal polyps, asthma/ failed xolair 4 days acute illness began with scratchy throat. Now throat hurts to cough, rattling cough. denies fever, glands, dysphagia, head congestion or sneeze. No sick exposure. She increased prednisone from 5mg  daily maint to 30 mg,2days ago, 40 mg yesterday. Added Mucinex D and keflex/ day 3  September 19, 2009 - Rhinitis (Dr [place] Callas), rhinosinusitis, nasal polyps, asthma/ failed xolair She returns not feeling better after treatment last visit. Still wheeze and morning chest congestion. Cough yellow green, wheeze. No fever or blood, chest pain, palpitation or ankle edema. She has extended her course of Keflex.Still on prednisone 40 mg daily.  Oct 30, 2009-  Rhinitis(Dr [place] Callas), rhinosinusitis, nasal polyps, asthma/ failed Xolair Asks what to do for occasional scratchy throat with nothing else.- Suggested conservative care. Still taking prednisone 5 mg daily maintenance. We talked again today about steroid side effects and goals.. Denies cough, wheeze or sneeze, chest pain or palpitation, bloody or purulent discharge, nodes, fever or swelling feet.   Current Medications (verified): 1)  Ventolin Hfa 108 (90 Base) Mcg/act  Aers (Albuterol Sulfate) .... 2 Puffs Every 4hr As Needed Wheeizng 2)  Prednisone 5 Mg Tabs (Prednisone) .Marland Kitchen.. 1 Tablet By Mouth Once Daily 3)  Ranitidine Hcl 300 Mg Tabs (Ranitidine Hcl) .... One Tablet By Mouth Two Times A Day 4)  Mucinex D 60-600 Mg Xr12h-Tab (Pseudoephedrine-Guaifenesin) .Marland Kitchen.. 1 By Mouth Two Times A Day  Allergies (verified): 1)  Aspirin  Past History:  Past Medical History: Last updated: 10/21/2008 NASAL POLYP (ICD-471.0) EXTRINSIC ASTHMA, UNSPECIFIED (ICD-493.00) Stopped Xolair, steroid  dependent ALLERGIC RHINITIS (ICD-477.9)  Failed allergy vaccine.  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 Pt is active Alcohol Use - no  Risk Factors: Exercise: no (07/31/2008)  Risk Factors: Smoking Status: quit (07/31/2008)  Review of Systems      See HPI  The patient denies shortness of breath with activity, shortness of breath at rest, productive cough, non-productive cough, coughing up blood, chest pain, irregular heartbeats, acid heartburn, indigestion, loss of appetite, weight change, abdominal pain, difficulty swallowing, sore throat, tooth/dental problems, headaches, nasal congestion/difficulty breathing through nose, and sneezing.    Vital Signs:  Patient profile:   60 year old female Height:      69 inches Weight:      187 pounds BMI:     27.71 O2 Sat:      98 % on Room air Pulse rate:   101 / minute BP sitting:   110 / 60  (left arm) Cuff size:   regular  Vitals Entered By: Reynaldo Minium CMA (Oct 30, 2009 3:12 PM)  O2 Flow:  Room air  Physical Exam  Additional Exam:  General: A/Ox3; pleasant and cooperative, NAD, SKIN: no rash, lesions HEENT: /AT, EOM- WNL, Conjuctivae- clear, PERRLA, TM-WNL, no JVD,. Mallampati  II, thrush/ coated tongue NECK: Supple w/ fair ROM, JVD- none, normal carotid impulses w/o bruits , no stridor CHEST:Rough,  wheezey cough. Not labored, no dullness.. ABDOMEN: soft, nt, nd , +bs ZOX:WRUE, nl pulses, no edema  NEURO: Grossly intact to  observation      Impression & Recommendations:  Problem # 1:  EXTRINSIC ASTHMA, UNSPECIFIED (ICD-493.00) Good control on maintnenance prednisone. Will try alternate day therapy. Thrush- mycelex troches  Problem # 2:  ALLERGIC RHINITIS (ICD-477.9)  Good control  Medications Added to Medication List This Visit: 1)  Clotrimazole 10 Mg Troc (Clotrimazole) .... Melt in mouth three times a day as needed thrush  Other Orders: Est. Patient Level II (45409) Prescription Created Electronically [phone])  Patient Instructions: 1)  Please schedule a follow-up appointment in 4 months. 2)  Script sent for mycelex troches for thrush 3)  Try skipping an occasional day to reduce your prednisone dose. Prescriptions: CLOTRIMAZOLE 10 MG TROC (CLOTRIMAZOLE) melt in mouth three times a day as needed thrush  #30 x prn   Entered and Authorized by:   Waymon Budge MD   Signed by:   Waymon Budge MD on 10/30/2009   Method used:   [facility]        [facility]  [address] [id number]       [address]       [place], [place]  [address]       Ph: [phone] or [phone]       Fax: [phone]   RxID:   [id number]

## 2010-07-14 NOTE — Progress Notes (Signed)
Summary: nos appt  Phone Note Call from Patient   Caller: [email or web address]  Call For: young Summary of Call: Rsc nos from 9/16 to 9/20 @ 2:45p. Initial call taken by: Darletta Moll,  March 02, 2010 11:09 AM

## 2010-07-14 NOTE — Miscellaneous (Signed)
Summary: Injection Record / [facility]    Injection Record / [facility]    Imported By: Lennie Odor 11/03/2009 15:59:26  _____________________________________________________________________  External Attachment:    Type:   Image     Comment:   External Document

## 2010-07-14 NOTE — Progress Notes (Signed)
Summary: rx req-LMTCB x 1  Phone Note Call from Patient   Caller: Patient Call For: young Summary of Call: pt requests to speak to nurse re: a med that another dr had prescribed for her previously. pt says that the rx ran out- the dr is no longer in practice. pt # [id number] x [phone]- [facility] [address]  Initial call taken by: Tivis Ringer, CNA,  November 17, 2009 1:00 PM  Follow-up for Phone Call        LMOVMTCB [name] [name]  November 17, 2009 2:25 PM  PT REACHED THE [facility] SHE WAS LOOKING FOR SO DOES NOT NEED [place] TO DO ANYTHING FURTHER  Follow-up by: Philipp Deputy CMA,  November 18, 2009 4:03 PM

## 2010-07-14 NOTE — Progress Notes (Signed)
Summary: Medication Refill   Phone Note Call from Patient Call back at Work Phone [phone]   Caller: Patient Call For: Dr. Russella Dar Reason for Call: Refill Medication Details for Reason: Medication Refill Summary of Call: Pt. is still having tightness in her chest. Was prescribed Carafate w/no refills when she came in last week. Can she get a refill, or is there something else she should do? Initial call taken by: Schuyler Amor,  April 24, 2010 9:14 AM  Follow-up for Phone Call        Called the pt on her work number. I had to leave a message for her that I will talk to [facility] PA about the Carafate and the tightness in her chest. Follow-up by: Joselyn Glassman,  April 27, 2010 9:05 AM    Prescriptions: CARAFATE 1 GM/10ML SUSP (SUCRALFATE) Carafate Slurry- Take 1 gram an hour after meals and at bedtime  #120 x 2   Entered by:   Lowry Ram NCMA   Authorized by:   Sammuel Cooper PA-c   Signed by:   Lowry Ram NCMA on 04/27/2010   Method used:   [facility]        [facility]  [address] [id number]       [address]       [place], [place]  [address]       Ph: [phone] or [phone]       Fax: [phone]   RxID:   [id number]   Appended Document: Medication Refill Called pt and did get to speak to her this time. I advised her that Amy did approve me to send in 2 refills on the Carafate slurry.  She said that she is also using the Hyoscyamine tablets.  She isn't seeing much impovement.  She talked to the MD on call  this weekend they called in some Hyoscyamine tablets. I did urger her to keep her appt with Dr. Russella Dar scheduled for 05-11-10 at 11:15 AM.  She asked what she should do if she has taken the Hyscyamine Carafate and the chest tightness comes back before it is time for the next dose?   Appended Document: Medication Refill Continue that regimen and keep follow up with dr. Russella Dar  Appended Document: Medication  Refill Spoke to pt and the oncall MD told her to take OTC Zegerid  Continue the Carafate and the Hyoscyamine.  Keep appt with Dr Russella Dar and call Elita Quick on Thurs 04-30-10.

## 2010-07-14 NOTE — Assessment & Plan Note (Signed)
Summary: chest congestion, cough no better / cj   Primary Provider/Referring Provider:  Billee Cashing, MD  CC:  chest congestion cough dry.  History of Present Illness: June 02, 2009- Rhinitis (Dr Spencer Callas), rhinosinusitis, nasal polyps, asthma/ failed Xolair Had GOT without problems Dec 7 for bunion surgery. Did well after seeing NP here in November. She took up to 40 mg prednisone at that visit, and got down to 5 mg daily until 2 days ago, when she again had to burst ot 40 x 2 days and 30 mg today. Denies obvious infection- blames cold air. She took Xolair for 3 years with no benefit and no prednisone reduction. Dr Lazarus Salines has her using saline lavage daily and status of sinuses seems related to hefr asthma. Bone density said to be stable, so her Gyn took her off Prempro.  Inhaled cortisones never held her.  September 17, 2009-Rhinitis (Dr Luverne Callas), rhinosinusitis, nasal polyps, asthma/ failed xolair 4 days acute illness began with scratchy throat. Now throat hurts to cough, rattling cough. denies fever, glands, dysphagia, head congestion or sneeze. No sick exposure. She increased prednisone from 5mg  daily maint to 30 mg,2days ago, 40 mg yesterday. Added Mucinex D and keflex/ day 3  September 19, 2009 - Rhinitis (Dr Altheimer Callas), rhinosinusitis, nasal polyps, asthma/ failed xolair She returns not feeling better after treatment last visit. Still wheeze and morning chest congestion. Cough yellow green, wheeze. No fever or blood, chest pain, palpitation or ankle edema. She has extended her course of Keflex.Still on prednisone 40 mg daily.   Allergies: 1)  Aspirin  Comments:  Nurse/Medical Assistant: The patient's medications and allergies were reviewed with the patient and were updated in the Medication and Allergy Lists.  Past History:  Past Medical History: Last updated: 10/21/2008 NASAL POLYP (ICD-471.0) EXTRINSIC ASTHMA, UNSPECIFIED (ICD-493.00) Stopped Xolair, steroid  dependent ALLERGIC RHINITIS (ICD-477.9)  Failed allergy vaccine.  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 Pt is active Alcohol Use - no  Risk Factors: Exercise: no (07/31/2008)  Risk Factors: Smoking Status: quit (07/31/2008)  Review of Systems      See HPI       The patient complains of dyspnea on exertion and prolonged cough.  The patient denies anorexia, fever, weight loss, weight gain, vision loss, decreased hearing, hoarseness, chest pain, syncope, peripheral edema, headaches, hemoptysis, abdominal pain, and severe indigestion/heartburn.    Vital Signs:  Patient profile:   60 year old female Height:      69 inches Weight:      190 pounds O2 Sat:      96 % on Room air Pulse rate:   91 / minute BP sitting:   130 / 80  (left arm) Cuff size:   regular  Vitals Entered By: Randell Loop CMA (September 19, 2009 3:46 PM)  O2 Sat at Rest %:  96 O2 Flow:  Room air CC: chest congestion cough dry Is Patient Diabetic? No Pain Assessment Patient in pain? no      Comments meds updated today   Physical Exam  Additional Exam:  General: A/Ox3; pleasant and cooperative, NAD, SKIN: no rash, lesions HEENT:  [place]/AT, EOM- WNL, Conjuctivae- clear, PERRLA, TM-WNL, no JVD,. Mallampati  II NECK: Supple w/ fair ROM, JVD- none, normal carotid impulses w/o bruits , no stridor CHEST:Rough, wheezey cough. Not labored, no dullness.. ABDOMEN: soft, nt, nd , +bs EAV:WUJW, nl pulses, no edema  NEURO: Grossly intact to  observation      Impression & Recommendations:  Problem # 1:  EXTRINSIC ASTHMA, UNSPECIFIED (ICD-493.00) Sustained exacerbation of asthmatic bronchitis. We will repeat neb and depo, change antibiotic to biaxin, get CXR, continue prednisone at 40 mg daily at least throught the weekend.  Medications Added to Medication List This Visit: 1)  Mucinex D 60-600 Mg Xr12h-tab (Pseudoephedrine-guaifenesin) .Marland Kitchen.. 1 by mouth two times a day 2)  Clarithromycin 500 Mg Tabs (Clarithromycin) .Marland Kitchen.. 1 two times a day after meals 3)  Prednisone 10 Mg Tabs (Prednisone) .... 4 daily x 3-4 days, then taper as a ble to 1/2 tab daily  Other Orders: Est. Patient Level II (11914) T-2 View CXR (71020TC) Nebulizer Tx (78295) Admin of Therapeutic Inj  intramuscular or subcutaneous (62130) Depo- Medrol 40mg  (J1030) Depo- Medrol 80mg  (J1040)  Patient Instructions: 1)  Keep scheduled appointment 2)  depo 120 mg 3)  neb xop 1.25/ ipratropium 0.5mg  4)  Scripts for prednisone 10 mg to taper from 40 mg daily as able, and for antibiotic sent to your drug store. Prescriptions: PREDNISONE 10 MG TABS (PREDNISONE) 4 daily x 3-4 days, then taper as a ble to 1/2 tab daily  #100 x 0   Entered and Authorized by:   Waymon Budge MD   Signed by:   Waymon Budge MD on 09/19/2009   Method used:   [facility] [id number]       [address]       [place], [place]  [address]       Ph: [phone] or [phone]       Fax: [phone]   RxID:   [id number] CLARITHROMYCIN 500 MG TABS (CLARITHROMYCIN) 1 two times a day after meals  #14 x 0   Entered and Authorized by:   Waymon Budge MD   Signed by:   Waymon Budge MD on 09/19/2009   Method used:   [facility] [id number]       [address]       [place], [place]  [address]       Ph: [phone] or [phone]       Fax: [phone]   RxID:    [id number]    Medication Administration  Injection # 1:    Medication: Depo- Medrol 80mg     Diagnosis: EXTRINSIC ASTHMA, UNSPECIFIED (ICD-493.00)    Route: SQ    Site: LUOQ gluteus    Exp Date: 04/2012    Lot #: [id number]    Mfr: Pharmacia    Patient tolerated injection without complications    Given by: Reynaldo Minium CMA (September 19, 2009 4:33 PM)  Injection # 2:    Medication: Depo- Medrol 40mg     Diagnosis: EXTRINSIC ASTHMA, UNSPECIFIED (ICD-493.00)    Route: SQ    Site: LUOQ gluteus    Exp Date: 04/2012    Lot #: [id number]    Mfr: Pharmacia    Patient tolerated injection without complications    Given by:  Reynaldo Minium CMA (September 19, 2009 4:33 PM)  Medication # 1:    Medication: Xopenex 1.25mg     Diagnosis: EXTRINSIC ASTHMA, UNSPECIFIED (ICD-493.00)    Dose: 1 vial    Route: inhaled    Exp Date: 02/2010    Lot #: [id number]    Mfr: Sepracor    Patient tolerated medication without complications    Given by: Reynaldo Minium CMA (September 19, 2009 4:31 PM)  Medication # 2:    Medication: Ipratropium inhalation sol. unit dose    Diagnosis: EXTRINSIC ASTHMA, UNSPECIFIED (ICD-493.00)    Dose: 1 vial    Route: inhaled    Exp Date: 09/2010    Lot #: [id number]    Mfr: Nephron  Orders Added: 1)  Est. Patient Level II [78469] 2)  T-2 View CXR [71020TC] 3)  Nebulizer Tx [94640] 4)  Admin of Therapeutic Inj  intramuscular or subcutaneous [96372] 5)  Depo- Medrol 40mg  [J1030] 6)  Depo- Medrol 80mg  [J1040]

## 2010-07-16 NOTE — Progress Notes (Signed)
Summary: predisone-  Phone Note Call from Patient Call back at Work Phone [phone] Call back at ext [phone]   Caller: Patient Call For: young Summary of Call: calling back on her predisone has issues with is Initial call taken by: Lacinda Axon,  May 25, 2010 3:07 PM  Follow-up for Phone Call        Pt states insurance will not fill rx sent in for Prednisone due to refill too soon. Pt states she needs a new sig and quantity # in order for insurance to cover same. (will cover for acute dose). Please advise. Thanks. Zackery Barefoot CMA  May 25, 2010 4:16 PM   Allergies: ASA  Additional Follow-up for Phone Call Additional follow up Details #1::        Per CDY okay to give patient Prednisone 10mg  #20 take 4 x 2 days, 3 x 2 days, 2 x 2 days, 1 x 2 days. no refills.Reynaldo Minium CMA  May 25, 2010 5:02 PM     Additional Follow-up for Phone Call Additional follow up Details #2::    LMOMTCB Vernie Murders  May 25, 2010 5:08 PM  Spoke tto pt and she is aware [facility] [place]. [name] CMA  May 26, 2010 10:19 AM   New/Updated Medications: PREDNISONE 10 MG TABS (PREDNISONE) take 4 x 2 days, 3 x 2 days, 2 x 2 days, 1 x 2 days. no refills Prescriptions: PREDNISONE 10 MG TABS (PREDNISONE) take 4 x 2 days, 3 x 2 days, 2 x 2 days, 1 x 2 days. no refills  #20 x 0   Entered by:   [name] CMA   Authorized by:   Waymon Budge MD   Signed by:   [name] CMA on 05/26/2010   Method used:   [phone]       [address]       [place], [place]  [address]       Ph: [phone]       Fax: [phone]   RxID:   [id number]

## 2010-07-16 NOTE — Progress Notes (Signed)
Summary: Irritated esophagus  Medications Added ZEGERID 40-1100 MG CAPS (OMEPRAZOLE-SODIUM BICARBONATE) 1 by mouth two times a day       Phone Note Call from Patient Call back at Work Phone [phone] Call back at [phone]   Call For: Dr Russella Dar Reason for Call: Talk to Nurse Summary of Call: Zegrid is not really working. Wants to change it. What is the treatment for an irritated esophagus? Initial call taken by: Leanor Kail [facility],  June 01, 2010 10:30 AM  Follow-up for Phone Call        Left message for patient to call back Darcey Nora RN, [facility]  June 01, 2010 3:01 PM  Patient  is still having chest pressure and occasional SOB.  She does not feel that this is related to asthma.  Her pulmonologist has increased her prednisone and is telling her her symptoms are not consistent with asthma.  Can we sample her different PPI's and see what helps? or Increase her zegerid to 40 two times a day.   Follow-up by: Darcey Nora RN, CGRN,  June 01, 2010 4:46 PM  Additional Follow-up for Phone Call Additional follow up Details #1::        Try Zegerid 40mg  by mouth two times a day, Nexium 40mg  by mouth two times a day or Aciphex 20mg  po bid. I do not think all her symptoms are GERD related but we should maximize reflux therapy. Additional Follow-up by: Meryl Dare MD FACG,  June 01, 2010 6:49 PM    Additional Follow-up for Phone Call Additional follow up Details #2::    Patient  aware of Dr Stark'srecommendations.  She wants to increase zegerid she will call back if this doesn't help and we will sample outAciphex.  She states she has tried nexium in the past with no results. Follow-up by: Darcey Nora RN, CGRN,  June 02, 2010 8:44 AM  New/Updated Medications: ZEGERID 40-1100 MG CAPS (OMEPRAZOLE-SODIUM BICARBONATE) 1 by mouth two times a day Prescriptions: ZEGERID 40-1100 MG CAPS (OMEPRAZOLE-SODIUM BICARBONATE) 1 by mouth two times a day  #60 x 6   Entered by:   Darcey Nora  RN, CGRN   Authorized by:   Meryl Dare MD [facility]   Signed by:   Darcey Nora RN, CGRN on 06/02/2010   Method used:   [phone]       [address]       [place], [place]  [address]       Ph: [phone]       Fax: [phone]   RxID:   [id number]

## 2010-07-16 NOTE — Progress Notes (Signed)
Summary: wheezing - LMTCB x2  Phone Note Call from Patient   Caller: Patient Call For: [name] [name] Summary of Call: pt states that she is wheezing again even though the prednisone dosage had been increased. pt # [id number] x [phone]. ([facility] [address] [facility] [place]) Initial call taken by: Tivis Ringer, CNA,  June 03, 2010 1:20 PM  Follow-up for Phone Call        called spoke with patient who was given a pred taper on 12.12.11 for wheezing.  she resumed her normal dosing of 5mg  today and c/o wheezing and tightness in chest onset today.  wondering if abx is necessary.  denies cough, SOB, sinus symptoms.  CDY not in office this afternoon.  will forward to doc of the day. Boone Master CNA/MA  June 03, 2010 3:42 PM   Additional Follow-up for Phone Call Additional follow up Details #1::        let pt know that if someone has persistent wheezing even after finishing a prednisone pulse, it usually indicates that it is not from asthma.  Reflux is a good possibility.  I am ok to increase prednisone to 10mg  a day, but needs ov if she is not improving.  please send a copy of this to dr. Maple Hudson as well for his review in am. Additional Follow-up by: Barbaraann Share MD,  June 03, 2010 5:17 PM    Additional Follow-up for Phone Call Additional follow up Details #2::    ATC pt at work number provided in [place].  was told office hours are 8a to 8pm, but told me to call back during regular business hours.  tried this twice.  called pt at home #, LM w/ FM TCB tomorrow morning when the office opens. Boone Master CNA/MA  June 03, 2010 5:25 PM   [facility] on both home and work number Vernie Murders  June 04, 2010 3:42 PM   Additional Follow-up for Phone Call Additional follow up Details #3:: Details for Additional Follow-up Action Taken: Spoke with pt and notified of the above recs per [facility].  She verbalized understanding and states will call for ov by next wk if not improving.  Will forward to CDY  so that he is aware. Additional Follow-up by: Vernie Murders,  June 04, 2010 3:51 PM

## 2010-07-16 NOTE — Progress Notes (Signed)
Summary: wheezing - LMCTB   Phone Note Call from Patient   Caller: Patient Call For: young Summary of Call: pt wheezing over the weekend. she has increased usage of her prednisone and used an inhaler this morning. pls advise[facility] [place] [id number]. pt # [id number] x [phone] Initial call taken by: Tivis Ringer, CNA,  May 25, 2010 8:38 AM  Follow-up for Phone Call        Called, spoke with pt.  States she started wheezing on Friday.  Increased pred to 20 mg on Friday and Saturday and 10mg  on "Sunday and today.  Still having wheezing this am -- had to use ventolin this am d/t wheezing.  Requesting CY's recs.    Allergies (verified): ASPIRIN [facility] [place]  Dr. Young, pls advise.  Thanks! Follow-up by: Crystal Jones RN,  May 25, 2010 9:51 AM  Additional Follow-up for Phone Call Additional follow up Details #1::        Per CDY-okay to tell  patient to take  Prednisone he currently has as follows(as a burst) take 4 x 2 days, 3 x 2 days, 2 x 2 days, then 1 daily.Katie Welchel CMA  May 25, 2010 10:49 AM   Called, spoke with pt.  She was informed of above per CY.  She verbalized understanding of instructions on how to take pred and is requesting rx.  States she has only 1 rx left and its only for 15 tablets and only has about 4 tablets now.    Additional Follow-up by: Crystal Jones RN,  May 25, 2010 11:03 AM    Additional Follow-up for Phone Call Additional follow up Details #2::    Ok to give patient refill as requested. Prednisone 10mg take 1/2 by mouth once daily #100 no refills.Katie Welchel CMA  May 25, 2010 12:00 PM   LMOMTCB to inform pt prednsione rx sent to [facility] [place]. Crystal Jones RN  May 25, 2010 12:11 PM  per chantel pt aware rx sent to pharmacy  Follow-up by: [name] Davis CMA,  May 25, 2010 12:41 PM  Prescriptions: PREDNISONE 10 MG TABS (PREDNISONE) 1/2  tab daily  #100 x 0   Entered by:   Crystal Jones RN  Authorized by:   Clinton D Young MD   Signed by:   Crystal Jones RN on 05/25/2010   Method used:   [id number]       [address]       [place], [place]  [address]       Ph: [phone]       Fax: [phone]   RxID:   [id number]

## 2010-07-22 NOTE — Progress Notes (Signed)
Summary: requesting rx  Phone Note Call from Patient Call back at Work Phone [phone] Call back at ext [phone]   Caller: Patient Call For: young Reason for Call: Talk to Nurse Summary of Call: Patient calling saying the last couple of nights she has had a stopped up nose.  Now is she starting to wheeze.  Requesting rx for this.  [facility] [place] Initial call taken by: Lehman Prom,  July 14, 2010 3:29 PM  Follow-up for Phone Call        Spoke with pt and she staets that x 2 weeks she has been having chest congestion, NO cough, nasal congestion, sneezing and wheezing as well. Pt states the nasal congestion is worse at night. Pt states last night she took 10mg  of predisone and she ahs been using a humidifier as well. She states the wheezing has gotten worse as the day has gone on today. Please advise.[name] CMA  July 14, 2010 3:57 PM allergies: asa   Additional Follow-up for Phone Call Additional follow up Details #1::        Per CDY-Afrin or Sudafed for nose and Prednisone 10mg  #20 take 4 x 2days, 3 x 2 days,2 x 2 days, 1 x 2 days no refills if pt needs.Reynaldo Minium CMA  July 14, 2010 4:39 PM     Additional Follow-up for Phone Call Additional follow up Details #2::    Spoke with pt and advised of the above recs per CDY.  Pt verbalized understanding and rx was sent to pharm. Follow-up by: Vernie Murders,  July 14, 2010 5:01 PM  New/Updated Medications: PREDNISONE 10 MG TABS (PREDNISONE) 4 x 2 days, 3 x 2 days, 2 x 2 days, 1 x 2 days, then stop Prescriptions: PREDNISONE 10 MG TABS (PREDNISONE) 4 x 2 days, 3 x 2 days, 2 x 2 days, 1 x 2 days, then stop  #20 x 0   Entered by:   Vernie Murders   Authorized by:   Waymon Budge MD   Signed by:   Vernie Murders on 07/14/2010   Method used:   [phone]       [address]       [place], [place]  [address]       Ph: [phone]       Fax: [phone]   RxID:    [id number]

## 2010-07-28 ENCOUNTER — Telehealth (INDEPENDENT_AMBULATORY_CARE_PROVIDER_SITE_OTHER): Payer: Self-pay | Admitting: *Deleted

## 2010-08-05 NOTE — Progress Notes (Signed)
Summary: sore throat > augmentin rx  Phone Note Call from Patient   Caller: Patient Call For: Young Summary of Call: patient phoned she woke up this morning about 2:30 with a sore throat that just wont go away. she stated that as she tapered off of the prednisone this started to happen. Patient can be reached [phone] ext [phone] Initial call taken by: Vedia Coffer,  July 28, 2010 8:40 AM  Follow-up for Phone Call        Pt c/o sorethroat on right side.  Sinus drainage (yellow).  Denies diff swallowing or FCS.  Pt is down to Pred 10mg  1/2 tab once daily .  Please advise.  Abigail Miyamoto RN  July 28, 2010 9:41 AM   Per CDY, augmentin 875mg  #14 1 two times a day no refills.   Follow-up by: Gweneth Dimitri RN,  July 28, 2010 11:18 AM  Additional Follow-up for Phone Call Additional follow up Details #1::        Called, spoke with pt.  She is aware of above recs per CDY.  Aware [facility].  Pt verbalized understanding.  Additional Follow-up by: Gweneth Dimitri RN,  July 28, 2010 11:21 AM    New/Updated Medications: AUGMENTIN 875-125 MG TABS (AMOXICILLIN-POT CLAVULANATE) Take 1 tablet by mouth two times a day Prescriptions: AUGMENTIN 875-125 MG TABS (AMOXICILLIN-POT CLAVULANATE) Take 1 tablet by mouth two times a day  #14 x 0   Entered by:   Gweneth Dimitri RN   Authorized by:   Waymon Budge MD   Signed by:   Gweneth Dimitri RN on 07/28/2010   Method used:   [phone]       [address]       [place], [place]  [address]       Ph: [phone]       Fax: [phone]   RxID:   [id number]

## 2010-08-12 ENCOUNTER — Other Ambulatory Visit: Payer: Self-pay | Admitting: Obstetrics and Gynecology

## 2010-08-13 ENCOUNTER — Telehealth (INDEPENDENT_AMBULATORY_CARE_PROVIDER_SITE_OTHER): Payer: Self-pay | Admitting: *Deleted

## 2010-08-20 NOTE — Progress Notes (Signed)
Summary: prednisone-tapering off  Phone Note Call from Patient   Caller: Patient Call For: young Summary of Call: pt is on prednisone and wants to get off it. [phone] Initial call taken by: Tivis Ringer, CNA,  August 13, 2010 9:46 AM  Follow-up for Phone Call        lmomtcb x 1. Zackery Barefoot CMA  August 13, 2010 10:44 AM    Patient calling back.  Return call at same number.  Lehman Prom  August 13, 2010 11:00 AM   Additional Follow-up for Phone Call Additional follow up Details #1::        Spoke with pt.  She states that prednisone is causing her to have acid reflux and recurring vaginal infections.  She states that her OBGYN inforrmed her that these symptoms could be coming from prednisone.  She would like to discuss coming off of med. Pls advise thanks Additional Follow-up by: Vernie Murders,  August 13, 2010 11:07 AM    Additional Follow-up for Phone Call Additional follow up Details #2::    Try going to 1/2 x 10 mg tab, every other day, for a week, then stop if stable.  Follow-up by: Waymon Budge MD,  August 13, 2010 1:24 PM  Additional Follow-up for Phone Call Additional follow up Details #3:: Details for Additional Follow-up Action Taken: Spoke with pt and offered the above recs.  She verbalized understanding, but asks, "is there any other taper program".  She states has been taking prednisone for 30 yrs and was thinking that she would need to taper off a little bit slower than the above.  Pls advise thanks Vernie Murders  August 13, 2010 1:43 PM  Per CDY need for info regarding how she has been taking med.  I called and spoke with pt.  She states that she has been taking 10 mg tablets, 1/2 daily.  Will forward to CDY for further recs. Pls advise thanks Vernie Murders  August 14, 2010 9:39 AM  Try changing to 1/2 tab, = 5 mg, every other day for 2 weeks, then try stopping.   Spoke with pt and notified of the above recs per CDY. Pt verbalized understanding.    Additional Follow-up by: Vernie Murders,  August 14, 2010 1:36 PM

## 2010-08-31 ENCOUNTER — Encounter: Payer: Self-pay | Admitting: Internal Medicine

## 2010-08-31 ENCOUNTER — Ambulatory Visit (INDEPENDENT_AMBULATORY_CARE_PROVIDER_SITE_OTHER): Payer: Managed Care, Other (non HMO) | Admitting: Internal Medicine

## 2010-08-31 DIAGNOSIS — J45909 Unspecified asthma, uncomplicated: Secondary | ICD-10-CM

## 2010-08-31 DIAGNOSIS — K219 Gastro-esophageal reflux disease without esophagitis: Secondary | ICD-10-CM

## 2010-08-31 DIAGNOSIS — J309 Allergic rhinitis, unspecified: Secondary | ICD-10-CM

## 2010-09-02 ENCOUNTER — Telehealth: Payer: Self-pay | Admitting: Internal Medicine

## 2010-09-02 NOTE — Telephone Encounter (Signed)
I would be surprised if this small change makes that much difference. I would like her to stay with this dose as directed for now and see if the problem eases.  She can treat cramps for a day or so with heating pad, tylenol, etc. If she is still having trouble Friday, please let us know.

## 2010-09-02 NOTE — Telephone Encounter (Signed)
Called pt's work # - [employer]

## 2010-09-02 NOTE — Telephone Encounter (Signed)
Called and spoke with pt and she states she came in on Monday and was started on a prednisone taper 1 mg and she just started it yesterday. Pt states last night when she went to bed she was having bad feet cramps and would go up into her leg. Pt states this happened 4 times last night. Pt also states she never had to use her albuterol inhaler and last night she had to use it for the 1st time and this morning as well. Pt would like Dr. Roxy Cedar recommendations on what to do. Please advise Thanks! Allergies  Allergen Reactions  . Aspirin    [place], [place]

## 2010-09-02 NOTE — Telephone Encounter (Signed)
ATC pt at the number provided and msg states to call back during regular business hours.  WCB tomorrow.

## 2010-09-03 ENCOUNTER — Telehealth: Payer: Self-pay | Admitting: Internal Medicine

## 2010-09-03 NOTE — Telephone Encounter (Signed)
Called and informed pt of cdy recs from previous phone note taken on 09/02/10. Pt states she is now having some wheezing and wants to change her pred dose packaging. Dr. Maple Hudson had an opening at 2:00 on 3/23 and pt is scheduled to come in and see him and discuss this with him. [place], [place]

## 2010-09-03 NOTE — Telephone Encounter (Signed)
Pt called back and adivised her of cdy recs. Pt states she wants to changer her pred dose taper. Pt is scheduled to come in and see Dr. Maple Hudson tomorrow at 2:00.  [place], [place]

## 2010-09-04 ENCOUNTER — Encounter: Payer: Self-pay | Admitting: Internal Medicine

## 2010-09-04 ENCOUNTER — Ambulatory Visit (INDEPENDENT_AMBULATORY_CARE_PROVIDER_SITE_OTHER): Payer: Managed Care, Other (non HMO) | Admitting: Internal Medicine

## 2010-09-04 VITALS — BP 106/60 | HR 84 | Ht 70.0 in | Wt 158.4 lb

## 2010-09-04 DIAGNOSIS — J309 Allergic rhinitis, unspecified: Secondary | ICD-10-CM

## 2010-09-04 DIAGNOSIS — J330 Polyp of nasal cavity: Secondary | ICD-10-CM

## 2010-09-04 DIAGNOSIS — J45909 Unspecified asthma, uncomplicated: Secondary | ICD-10-CM

## 2010-09-04 NOTE — Patient Instructions (Signed)
Stay at 5 mg prednisone daily till you feel stable. The very slowly try alternating every other day with 5 mg and 4 mg to see if that will hold you without too much muscle weakness and cramp.   Sample Patanase nasal antihistamine spray   2 sprays each nostril once daily as needed.

## 2010-09-04 NOTE — Progress Notes (Signed)
  Subjective:    Patient ID: [name] [name], female    DOB: 1950-10-16, 60 y.o.   MRN: [id number]  HPI 60 yo former smoker followed here for Allergic rhinitis, asthma, recurrent rhinosinusitis w/ hx GERD and nasal polyps. Failed Xolair. . DR [place] Callas IS HER ALLERGIST. Recently seen for acute rhinitis exacerbation on 08/31/10. Has been on low dose prednisone since the early 1970's for asthma originally treated by Dr Corinda Gubler. At last visit we hoped to let her taper down or off the prednisone. 1 week ago, she tried changing prednisone to 5 mg every other day. Then with some wheeze and myalgias, we changed to 1 mg tabs and had her try 4 mg daily. She took 20 mg 2 days ago after waking tight. Continues at 20 mg daily. Today breathing is ok/ baseline. Original concern pushing desire to lower prednisone now was that prednisone may increase her GERD.    Review of Systems Constitutional:   No weight loss, night sweats,  Fevers, chills, fatigue, lassitude. HEENT:   No headaches,  Difficulty swallowing,  Tooth/dental problems,  Sore throat,                No sneezing, itching, ear ache, nasal congestion, post nasal drip,   CV:  No chest pain,  Orthopnea, PND, swelling in lower extremities, anasarca, dizziness, palpitations  GI  No heartburn, indigestion, abdominal pain, nausea, vomiting, diarrhea, change in bowel habits, loss of appetite  Resp: No shortness of breath with exertion or at rest.  No excess mucus, no productive cough,  No non-productive cough,  No coughing up of blood.  No change in color of mucus.  No wheezing. ......................today is much better than 2 days ago.  Skin: no rash or lesions.  GU: no dysuria, change in color of urine, no urgency or frequency.  No flank pain.  MS:  No joint pain or swelling.  No decreased range of motion.  No back pain.  Psych:  No change in mood or affect. No depression or anxiety.  No memory loss.     Objective:   Physical Exam General- Alert,  Oriented, Affect-appropriate, Distress- none acute  Skin- rash-none, lesions- none, excoriation- none  Lymphadenopathy- none  Head- atraumatic  Eyes- Gross vision intact, PERRLA, conjunctivae clear,   Ears- clear, hearing normal  Nose- Clear, No- Septal dev, mucus, polyps, erosion, perforation   Throat- Mallampati II , mucosa clear , drainage- none, tonsils- atrophic  Neck- flexible , trachea midline, no stridor , thyroid nl, carotid no bruit  Chest - symmetrical excursion , unlabored     Heart/CV- RRR , no murmur , no gallop  , no rub, nl s1 s2                     - JVD- none , edema- none, stasis changes- none, varices- none     Lung- clear to P&A, wheeze- none, cough- none , dullness-none, rub- none. She is unlabored now.      Chest wall- atraumatic, no scar  Abd- tender-no, distended-no, bowel sounds-present, HSM- no  Br/ Gen/ Rectal- Not done, not indicated  Extrem- cyanosis- none, clubbing, none, atrophy- none, strength- nl  Neuro- grossly intact to observation        Assessment & Plan:

## 2010-09-04 NOTE — Assessment & Plan Note (Signed)
Rhintis should respond to the prednisone. We will watch that and add where needed. Conselling about meds. Done.

## 2010-09-04 NOTE — Assessment & Plan Note (Signed)
We reviewed issues of adrenal insufficiency vs control of underlying airway disease. We we let her find her way between 5 mg and 4 mg every other day for awhile.

## 2010-09-05 ENCOUNTER — Encounter: Payer: Self-pay | Admitting: Internal Medicine

## 2010-09-05 NOTE — Assessment & Plan Note (Signed)
No visible polyps on recent exams, We will continue observing for recurrence.

## 2010-09-10 NOTE — Assessment & Plan Note (Signed)
Summary: rov 6 months/kp   Primary Provider/Referring Provider:  Billee Cashing, MD  CC:  6 month follow up visit-asthma and allergies; wanting to come off of Prednisone but wtih hayfever time-concerns. .  History of Present Illness: Oct 30, 2009-  Rhinitis(Dr [place] Callas), rhinosinusitis, nasal polyps, asthma/ failed Xolair Asks what to do for occasional scratchy throat with nothing else.- Suggested conservative care. Still taking prednisone 5 mg daily maintenance. We talked again today about steroid side effects and goals.. Denies cough, wheeze or sneeze, chest pain or palpitation, bloody or purulent discharge, nodes, fever or swelling feet.  March 03, 2010- Rhiniits (Dr [place] Callas), rhinosinusitis, nasal polyps, asthma. failed Xolair 4 days of increased rhinorhea, yellow, bitemporal headache. Denies wheeze, cough, fever,  sore throat or itching. Continues daily prednisone, 1/2 x 10 mg tab daily. This has definitely reduced exacerbations.  August 31, 2010-  Rhiniits (Dr [place] Callas), rhinosinusitis, nasal polyps, asthma. failed Xolair Nurse-CC: 6 month follow up visit-asthma and allergies; wanting to come off of Prednisone but wtih hayfever time-concerns.  Wakes with nasal congestion. Says was told not to take decongestants because they overdry sinuses, but was unclear about AH vs decongestant. She has been on prednisone for a long time- originally for asthma. we have tried to stop at least twice before, but she would come back complianig of chest discomfort.  Gyn pointed out that prednisone may aggravate her GERD and vaginal yeast.  Chest still gets tight but not wheeze. She thinks it is related to heartburn. Zegerid trial hasn't helped. Not using her rescue inhaler much.      Asthma History    Asthma Control Assessment:    Age range: 12+ years    Symptoms: 0-2 days/week    Nighttime Awakenings: 0-2/month    Interferes w/ normal activity: no limitations    SABA use (not for EIB): 0-2  days/week    Asthma Control Assessment: Well Controlled   Preventive Screening-Counseling & Management  Alcohol-Tobacco     Smoking Status: quit > 6 months     Year Quit: 1990  Current Medications (verified): 1)  Ventolin Hfa 108 (90 Base) Mcg/act  Aers (Albuterol Sulfate) .... 2 Puffs Every 4hr As Needed Wheeizng 2)  Prednisone 10 Mg Tabs (Prednisone) .... 1/2  Tab Daily 3)  Zegerid 40-1100 Mg Caps (Omeprazole-Sodium Bicarbonate) .Marland Kitchen.. 1 By Mouth Two Times A Day 4)  Hyoscyamine Sulfate 0.125 Mg Subl (Hyoscyamine Sulfate) .Marland Kitchen.. 1 Sublingually Every 4 Hours As Needed Chest Pain 5)  Calcium 500 Mg Tabs (Calcium) .... Take 1000mg  Daily  Allergies (verified): 1)  Aspirin  Past History:  Past Medical History: Last updated: 04/17/2010 NASAL POLYP (ICD-471.0) EXTRINSIC ASTHMA, UNSPECIFIED (ICD-493.00) Stopped Xolair, steroid dependent ALLERGIC RHINITIS (ICD-477.9)  Failed allergy vaccine.  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 Pt is active Alcohol Use - no  Risk Factors: Exercise: no (07/31/2008)  Risk Factors: Smoking Status: quit > 6 months (08/31/2010)  Review of Systems      See HPI       The patient complains of acid heartburn, nasal congestion/difficulty breathing through nose, and sneezing.  The patient denies  shortness of breath with activity, shortness of breath at rest, productive cough, non-productive cough, coughing up blood, chest pain, irregular  heartbeats, indigestion, loss of appetite, weight change, abdominal pain, difficulty swallowing, sore throat, headaches, rash, change in color of mucus, and fever.    Vital Signs:  Patient profile:   60 year old female Height:      69 inches Weight:      162 pounds BMI:     24.01 O2 Sat:      97 % on Room air Pulse rate:   76 / minute BP sitting:   106 / 58  (left arm) Cuff size:   regular  Vitals Entered By: Reynaldo Minium CMA (August 31, 2010 11:11 AM)  O2 Flow:  Room air CC: 6 month follow up visit-asthma and allergies; wanting to come off of Prednisone but wtih hayfever time-concerns.    Physical Exam  Additional Exam:  General: A/Ox3; pleasant and cooperative, NAD, SKIN: no rash, lesions HEENT: [place]/AT, EOM- WNL, Conjuctivae- clear, PERRLA, TM-WNL, NOSE- clear, with no visible polyps.. Mallampati  II, red at tonsillar crypts NECK: Supple w/ fair ROM, JVD- none, normal carotid impulses w/o bruits , no stridor CHEST:Clear, no cough or wheeze, cough. Not labored, no dullness.. ABDOMEN: soft, nt, nd , +bs ZOX:WRUE, nl pulses, no edema  NEURO: Grossly intact to observation      Impression & Recommendations:  Problem # 1:  ASTHMA UNSPECIFIED (ICD-493.90) Mild intermittent asthma. I will try again to get her off the prednisone. We discussed stereoids, adtrenal insufficiency and side effects. We will put her on a very slow taper with 1 mg tabs.   Problem # 2:  ALLERGIC RHINITIS (ICD-477.9)  We will let her try phenylephrine after discussing side effects and comparing antihistamines with decongestants.   Medications Added to Medication List This Visit: 1)  Prednisone 1 Mg Tabs (Prednisone) .... 4 x 7days 3 x 7 days 2 x7 days 1 daily x 7 days , then stop. 2)  Calcium 500 Mg Tabs (Calcium) .... Take 1000mg  daily  Other Orders: Est. Patient Level III (45409)  Patient Instructions: 1)  Please schedule a follow-up appointment in 6 weeks. 2)  Try otc decongestant phenylephrine (  Sudafed PE and others) 3)   decongestants are for stuffiness 4)   antihistamines are for drip, sneeze, itch 5)  script for 1 mg prednisone tabs.  6)   4 daily x 7 days 7)   3 daily x 7 days 8)   2 daily x 7 days 9)   1 daily x 7 days- then stop. Prescriptions: PREDNISONE 1 MG TABS (PREDNISONE) 4 x 7days 3 x 7 days 2 x7 days 1 daily x 7 days , then stop.  #70 x 0   Entered and Authorized by:   Waymon Budge MD   Signed by:   Waymon Budge MD on 08/31/2010   Method used:   Print then Give to Patient   RxID:   [id number]

## 2010-10-12 ENCOUNTER — Encounter: Payer: Self-pay | Admitting: Internal Medicine

## 2010-10-14 ENCOUNTER — Ambulatory Visit (INDEPENDENT_AMBULATORY_CARE_PROVIDER_SITE_OTHER): Payer: Managed Care, Other (non HMO) | Admitting: Internal Medicine

## 2010-10-14 ENCOUNTER — Encounter: Payer: Self-pay | Admitting: Internal Medicine

## 2010-10-14 VITALS — BP 112/66 | HR 79 | Ht 69.0 in | Wt 161.6 lb

## 2010-10-14 DIAGNOSIS — J45909 Unspecified asthma, uncomplicated: Secondary | ICD-10-CM

## 2010-10-14 DIAGNOSIS — E2740 Unspecified adrenocortical insufficiency: Secondary | ICD-10-CM

## 2010-10-14 DIAGNOSIS — E2749 Other adrenocortical insufficiency: Secondary | ICD-10-CM

## 2010-10-14 NOTE — Patient Instructions (Signed)
Try alternating prednisone 5 mg with 4 mg every other day for the next month. Then 4 mg daily x 1 month.  Order- Referral to Dr Everardo All for endocrinology evaluation of adrenal insufficiency suspected from your long history of prednisone use.

## 2010-10-14 NOTE — Assessment & Plan Note (Signed)
Because her breathing changes from day to day, it is harder to know what is related to changing steroid doses. We discussed and will refer her to Dr Everardo All for endocrinology assessment of her adrenal function as part of our effort to get off systemic steroids.

## 2010-10-14 NOTE — Progress Notes (Signed)
  Subjective:    Patient ID: [name] [name], female    DOB: 1951-02-06, 60 y.o.   MRN: [id number]  HPI 60 yoF with asthma, allergic rhinitis.  Last here August 31, 2010- Because of problems with GERD and yeast we were hoping to reduce prednisone which she has been on for much of the time since the 1970's. She has maintained mostly on 5 mg daily. We gave 1 mg tabs, but she didn't feel that 4 mg held her.  Heart burn is better and congestion a bit worse. Pollen season usually gets her some congested. She had been on Xolair x 3 years, then wheezed worse and it was stopped.  Today she feels ok, but changes day-to-day. Main trigger for her she thinks is temperature change/ weather. [place] trip - wheezed in [employer].  Review of Systems     Objective:   Physical Exam General- Alert, Oriented, Affect-appropriate, Distress- none acute   Very clear and normal on exam today  Skin- rash-none, lesions- none, excoriation- none  Lymphadenopathy- none  Head- atraumatic  Eyes- Gross vision intact, PERRLA, conjunctivae clear, secretions  Ears- Normal-Hearing, canals, Tm   Nose- Clear, No-Septal dev, mucus, polyps, erosion, perforation   Throat- Mallampati II , mucosa clear , drainage- none, tonsils- atrophic  Neck- flexible , trachea midline, no stridor , thyroid nl, carotid no bruit  Chest - symmetrical excursion , unlabored     Heart/CV- RRR , no murmur , no gallop  , no rub, nl s1 s2                     - JVD- none , edema- none, stasis changes- none, varices- none     Lung- clear to P&A, wheeze- none, cough- none , dullness-none, rub- none     Chest wall-  Abd- tender-no, distended-no, bowel sounds-present, HSM- no  Br/ Gen/ Rectal- Not done, not indicated  Extrem- cyanosis- none, clubbing, none, atrophy- none, strength- nl  Neuro- grossly intact to observation         Assessment & Plan:

## 2010-10-18 ENCOUNTER — Encounter: Payer: Self-pay | Admitting: Internal Medicine

## 2010-10-23 ENCOUNTER — Ambulatory Visit: Payer: Managed Care, Other (non HMO) | Admitting: Endocrinology

## 2010-10-27 NOTE — Assessment & Plan Note (Signed)
[name]                             PULMONARY OFFICE NOTE   NAME:[name] [name] [name]                       MRN:          [id number]  DATE:01/09/2007                            DOB:          April 05, 1951    PROBLEM:  1. Asthma/aspirin/nasal polyps triad.  2. Rhinosinusitis.   HISTORY:  Xolair seems to help her chest, but is not helping nasal  congestion with anosmia.  She has a history of multiple surgeries with  Dr. Zola Button T. Wolicki.  She had failed vaccine with Dr. Percival Spanish.  Doing daily sinus lavage, including a little baby shampoo and lavage  mix, and says it is always productive.  She saw no benefit from Astelin,  steroids and Nasalcrom.  She continues prednisone 5 mg daily.  The 5 mg  every other day was not enough and she began to rapidly worsen on that  before going back up on her dose.   MEDICATIONS:  1. Prednisone 5 mg daily.  2. Prempro.  3. Xolair injection 150 mg every four weeks.  4. Boniva 150 mg per month.  5. Albuterol rescue inhaler rarely used.  6. She does have a Twinjet injector for epinephrine.   ALLERGIES:  She is ASPIRIN intolerant as noted.   OBJECTIVE:  VITAL SIGNS:  Weight 175 pounds, blood pressure 112/60,  pulse 92, room air saturation 96%.  HEENT:  Her nose does not seem remarkable on examination today.  There  is no postnasal drainage.  No periorbital edema.  CHEST:  Clear.  LYMPHS:  I find no adenopathy.  HEART:  Sounds are normal.   IMPRESSION:  Rhinitis and probable recurrent rhinosinusitis with a  history of aspirin/polyps/asthma triad, as outlined.   PLAN:  1. Sample of Veramyst for trial, simply because she had not tried that      brand.  We had a frank talk about options.  2. She will continue Xolair, since that has helped her at least      modestly.  3. Scheduled to return in four months, or earlier p.r.n.     Clinton D. Maple Hudson, MD, Tonny Bollman, FACP  Electronically Signed    CDY/MedQ  DD:  01/09/2007  DT: 01/10/2007  Job #: [id number]   cc:   Lorelle Formosa, M.D.

## 2010-10-30 NOTE — Assessment & Plan Note (Signed)
 [facility]                             PULMONARY OFFICE NOTE   NAME:[name] [name] [name]                       MRN:          [id number]  DATE:10/10/2006                            DOB:          [birth year]    PROBLEMS:  1. Asthma/aspirin/nasal polyps triad.  2. Rhinosinusitis.   HISTORY:  This is a 60 year old former smoker previously followed here  by [name] [name] who will be leaving. She is being established with me.  She is no Xolair injections 150 mg per month and feels it helps. She  says that she has never accepted flu vaccine. She had previously failed  several trials of allergy vaccine, although from that we know that she  has been skin test positive. Previously she just had allergic rhinitis  symptoms, but she began to develop wheezing and was diagnosed with  asthma while on vaccine some years ago. She does admit she was not  prednisone dependent while on allergy vaccine, but she is now. There is  no history of pneumonia or heart disease. She has had multiple episodes  of sinusitis and has had nasal polypectomy. She now feels well except  for some mucinous nasal discharge. She quit Advair. Nasal steroid did  not help. 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 She follows with [name] [name] for allergy  management.   MEDICATIONS:  1. Zoloft 50 mg.  2. Prempro.  3. Xolair 150 mg per month.  4. Rescue albuterol inhaler used very occasionally.  5. She has a Twinjet auto injector.  Drug intolerant of ASPIRIN.   OBJECTIVE:  Weight 175 pounds, blood pressure 106/70, pulse 80, room air  saturation 97%. Conjunctivae are clear. Lung fields are clear. I can see  nasal polyps superiorly bilaterally. Heart sounds are normal.   IMPRESSION:  1. Steroid dependent rhinitis and asthma.  2. History of triad, allergic rhinitis, nasal polyps, and asthma with      aspirin sensitivity.   PLAN:  1. We are going to  restart Advair at 100/50 with discussion done.  2. Continue Xolair.  3. We reviewed her medications, environmental precautions, and      expectations of Xolair      therapy.  4. Schedule return 3 months, earlier p.r.n.     [name] D. Maple Hudson, MD, [name] [name], FACP  Electronically Signed    CDY/MedQ  DD: 10/13/2006  DT: 10/14/2006  Job #: [id number]   cc:   [name] [name], M.D. [facility]  [name] [name], M.D.  [name] [name], M.D.

## 2010-10-30 NOTE — Assessment & Plan Note (Signed)
[facility]                             PULMONARY OFFICE NOTE   NAME:[name] [name] LOOMAN                       MRN:          [id number]  DATE:08/23/2006                            DOB:          1950/10/25    This is a 60 year old African-American female whom I actually have not  seen since April 2007.  She does have issues with erratic followup.  The  patient has a history of mild persistent asthma.  The majority of her  symptoms, however, are related to nasal polyposis and chronic sinusitis  for which she is under the care of [name] [name].  The patient is on  chronic steroid therapy because of her sinus issues and not because of  her asthma.  She has, however, gotten her refills of prednisone in this  office for many years.  She had been placed on Xolair therapy which  started in August 2006.  The patient since then has noticed she has been  able to maintain on 5 mg of prednisone daily.  We do need to try to get  her off this medication if at all possible.  The patient appears to have  lost a significant amount of weight since her last visit in April 2007.  At that time she was weighing 181 pounds; currently she is weighing 170  pounds.  I did ask her if she has been seeing her primary care  physician.  When asked who her primary care physician is she states that  it is [name] [name]; however, she has not seen him in well over a  year.  The patient has not had recent thorough physical examination.   Current medications are as noted on the intake sheet.  These have been  reviewed and are accurate.   PHYSICAL EXAMINATION:  VITAL SIGNS:  Are as noted.  Oxygen saturation is  95% on room air.  GENERAL:  She is a somewhat pale-appearing African-American female.  She  appears chronically ill.  She is in no acute distress.  HEENT:  Remarkable for nasal turbinate edema.  NECK:  Supple.  No adenopathy noted, no JVD.  LUNGS:  Clear to auscultation  bilaterally.  CARDIAC:  Regular rate and rhythm.  No rubs, murmurs or gallops heard.  EXTREMITIES:  No cyanosis, clubbing or edema noted.   IMPRESSION:  1. Mild to moderate persistent asthma, well controlled on Xolair.  The      patient is also maintained on albuterol p.r.n.  She has      discontinued the use of Advair.  2. 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  3. Apparent weight loss.   PLAN:  1. I have obtained [place] health panel to include comprehensive      metabolic panel, CBC and TSH to evaluate the patient's weight loss.      I will forward this to her primary care physician.  I have      encouraged her to see her primary care physician for a full  evaluation.  2. She is to continue Xolair for now.  3. The patient will continue on prednisone 5 mg currently as it is.      However, I have instructed her that my hope is to get her      completely off this medication.  Clearly, she is not using this for      her asthma but for her nasal symptoms and I would request that if      this is the case she needs to get this medication refilled from Dr.      Lazarus [name].  4. I refill the patient to [name] [name] to evaluate any potential      unresolved allergic component to her symptoms.  5. Followup will be in 4-6 weeks' time.  She is to contact us prior to      that time should any new problems arise.     [name] Shelter, MD  Electronically Signed    CLG/MedQ  DD: 08/31/2006  DT: 08/31/2006  Job #: [id number]

## 2010-11-02 ENCOUNTER — Telehealth: Payer: Self-pay | Admitting: Internal Medicine

## 2010-11-02 MED ORDER — ALBUTEROL SULFATE HFA 108 (90 BASE) MCG/ACT IN AERS: 2.0000 | INHALATION_SPRAY | Freq: Four times a day (QID) | RESPIRATORY_TRACT | Status: DC

## 2010-11-02 NOTE — Telephone Encounter (Signed)
Per CY-try Prednisone 10 mg daily x 7 days then reduce to 5 mg daily(Pt has prednisone at home already per CY).

## 2010-11-02 NOTE — Telephone Encounter (Signed)
Pt aware of recs. Jennifer Castillo, CMA  

## 2010-11-02 NOTE — Telephone Encounter (Signed)
[id number] 

## 2010-11-02 NOTE — Telephone Encounter (Signed)
Called and spoke with pt.  She states had asthma flare 2 days ago- "felt rattling in lung" and had chest tightness and need to clear throat a lot. She states that she increased her prednisone form 5 to 10 mg yesterday and this seemed to help some and she was able to produce thick, yellow sputum.  Wants to know if she she up the pred for a few more days or if needs abx. Pls advise thanks! Allergies  Allergen Reactions  . Aspirin

## 2010-11-02 NOTE — Telephone Encounter (Signed)
Pt returned call from triage. Call her back at same # as before. Tivis Ringer

## 2010-11-11 ENCOUNTER — Telehealth: Payer: Self-pay | Admitting: Internal Medicine

## 2010-11-11 NOTE — Telephone Encounter (Signed)
Scheduled pt to see dr young on Friday at 2pm   Philipp Deputy, LPN

## 2010-11-11 NOTE — Telephone Encounter (Signed)
[email or web address]

## 2010-11-13 ENCOUNTER — Ambulatory Visit: Payer: Managed Care, Other (non HMO) | Admitting: Internal Medicine

## 2010-11-13 ENCOUNTER — Ambulatory Visit (INDEPENDENT_AMBULATORY_CARE_PROVIDER_SITE_OTHER): Payer: Managed Care, Other (non HMO) | Admitting: Internal Medicine

## 2010-11-13 ENCOUNTER — Encounter: Payer: Self-pay | Admitting: Internal Medicine

## 2010-11-13 DIAGNOSIS — J45909 Unspecified asthma, uncomplicated: Secondary | ICD-10-CM

## 2010-11-13 DIAGNOSIS — J329 Chronic sinusitis, unspecified: Secondary | ICD-10-CM

## 2010-11-13 MED ORDER — CEFDINIR 300 MG PO CAPS: 300.0000 mg | ORAL_CAPSULE | Freq: Two times a day (BID) | ORAL | Status: DC

## 2010-11-13 NOTE — Assessment & Plan Note (Addendum)
Mild now, but she describes needing to use her rescue inhaler. She has been on prednisone a lot over the years. We have again discussed steroid side effects and symptoms of withdrawal and adrenal insufficiency.

## 2010-11-13 NOTE — Patient Instructions (Signed)
Script for cefdinir antibiotic sent to your drug store   Sample Qnasl nose spray-  2 puffs each nostril, once daily at bedtime.   Try to hold prednisone at 5 mg daily if possible.

## 2010-11-13 NOTE — Progress Notes (Signed)
Subjective:    Patient ID: [name] [name], female    DOB: 12/29/1950, 60 y.o.   MRN: [id number]  HPI    Review of Systems     Objective:   Physical Exam        Assessment & Plan:   Subjective:    Patient ID: [name] [name], female    DOB: 10-26-1950, 60 y.o.   MRN: [id number]  HPI 10/14/10- 41 yoF with asthma, allergic rhinitis.  Last here August 31, 2010- Because of problems with GERD and yeast we were hoping to reduce prednisone which she has been on for much of the time since the 1970's. She has maintained mostly on 5 mg daily. We gave 1 mg tabs, but she didn't feel that 4 mg held her.  Heart burn is better and congestion a bit worse. Pollen season usually gets her some congested. She had been on Xolair x 3 years, then wheezed worse and it was stopped.  Today she feels ok, but changes day-to-day. Main trigger for her she thinks is temperature change/ weather. [place] trip - wheezed in [place].   11/13/10- Asthma, allergic rhinitis/ recurrent sinusitis Called last week and we had her take prednisone 10 mg daily x 7 days then go back to 5 mg daily. She says 5 mg isn't enough. Post nasal drip is getting in her lungs and requiring use of rescue inhaler several times daily. Denies dyspnea or headache. Occasional throat clearing of yellow mucus. Little sustained wheeze, just feels tight.  Review of Systems Per HPI Constitutional:   No weight loss, night sweats,  Fevers, chills, fatigue, lassitude. HEENT:   No headaches,  Difficulty swallowing,  Tooth/dental problems,  Sore throat,                No sneezing, itching, ear ache,   CV:  No chest pain,  Orthopnea, PND, swelling in lower extremities, anasarca, dizziness, palpitations  GI  No heartburn, indigestion, abdominal pain, nausea, vomiting, diarrhea, change in bowel habits, loss of appetite  Resp: No shortness of breath with exertion or at rest.  No excess mucus, No coughing up of blood.  No change in color of mucus.   No wheezing.    Skin: no rash or lesions.  GU: no dysuria, change in color of urine, no urgency or frequency.  No flank pain.  MS:  No joint pain or swelling.  No decreased range of motion.  No back pain.  Psych:  No change in mood or affect. No depression or anxiety.  No memory loss.      Objective:   Physical Exam General- Alert, Oriented, Affect-appropriate, Distress- none acute   Very clear and normal on exam today  Skin- rash-none, lesions- none, excoriation- none  Lymphadenopathy- none  Head- atraumatic  Eyes- Gross vision intact, PERRLA, conjunctivae clear secretions  Ears- Normal-Hearing, canals, Tm   Nose- Clear, No-Septal dev, mucus, polyps, erosion, perforation   Throat- Mallampati II ,  , drainage- none, tonsils- atrophic   Tongue is lightly coated.   Neck- flexible , trachea midline, no stridor , thyroid nl, carotid no bruit  Chest - symmetrical excursion , unlabored     Heart/CV- RRR , no murmur , no gallop  , no rub, nl s1 s2                     - JVD- none , edema- none, stasis changes- none, varices- none     Lung- clear to  P&A, wheeze- none, cough- none , dullness-none, rub- none     Chest wall-  Abd- tender-no, distended-no, bowel sounds-present, HSM- no  Br/ Gen/ Rectal- Not done, not indicated  Extrem- cyanosis- none, clubbing, none, atrophy- none, strength- nl  Neuro- grossly intact to observation         Assessment & Plan:

## 2010-11-13 NOTE — Assessment & Plan Note (Signed)
Clarithromycin from her ENT didn't clear her drainage 3 weeks ago. She never found benefit from nasal steroids, but willing to try Qnasl We will try cefdinir

## 2010-11-23 ENCOUNTER — Telehealth: Payer: Self-pay | Admitting: Internal Medicine

## 2010-11-23 NOTE — Telephone Encounter (Signed)
Called pt at her work and that office is closed, called home number and LMTCB

## 2010-11-23 NOTE — Telephone Encounter (Signed)
Per CY-if she can/would like sputum culture and smear; also give Levaquin 500 mg #10 1 daily no refills.

## 2010-11-23 NOTE — Telephone Encounter (Signed)
Called and spoke with pt.  Pt saw CY on 6/1 and rx was sent to pharmacy for [facility] to take bid x 10 days.  Pt states she finished abx and has had no change in symptoms.  States she is still blowing out yellow nasal drainage. Also has wheezing, tightness in chest, and did have a cough on Sat and Sun with some chest congestion.  Denies f/c/s.  Pt is currently on 5mg   Of Prednisone.  Pt states she is also taking the Qnasl sample with no change in symptoms.  Requesting Cy's recs.  Please advise.  Thanks.   Allergies  Allergen Reactions  . Aspirin

## 2010-11-24 ENCOUNTER — Telehealth: Payer: Self-pay | Admitting: Internal Medicine

## 2010-11-24 MED ORDER — LEVOFLOXACIN 500 MG PO TABS: 500.0000 mg | ORAL_TABLET | Freq: Every day | ORAL | Status: AC

## 2010-11-24 NOTE — Telephone Encounter (Signed)
Called and spoke with pt. Pt states she is having increased shortness of breath and tightness in her chest.  Offered pt an appt for tomorrow.  Pt declined stating she can't wait until tomorrow. Therefore recommended pt go to her nearest urgent car or ER.  Pt verbalized understanding and stated she was going to go to the ER now.

## 2010-11-24 NOTE — Telephone Encounter (Signed)
Addended by: Christen Butter on: 11/24/2010 02:41 PM   Modules accepted: Orders

## 2010-11-24 NOTE — Telephone Encounter (Signed)
Patient phoned stated that she was returning a call to triage she can be reached at [phone] ext [phone].[place] Nickel

## 2010-11-24 NOTE — Telephone Encounter (Signed)
Per Corky Downs- this has been handled. All she needed was refill on allergy serum and this has been done.

## 2010-11-24 NOTE — Telephone Encounter (Signed)
Last documentation was done in error- Spoke with pt and notified of recs per CDY. She states that her cough is non prod at all so can not leave a sputum sample at this time. Rx was sent to pharmacy. I advised pt to call if not improving. Pt verbalized understanding.

## 2010-12-14 ENCOUNTER — Ambulatory Visit: Payer: Managed Care, Other (non HMO) | Admitting: Internal Medicine

## 2010-12-29 ENCOUNTER — Other Ambulatory Visit: Payer: Self-pay | Admitting: Gastroenterology

## 2010-12-29 MED ORDER — HYOSCYAMINE SULFATE 0.125 MG SL SUBL: 0.1250 mg | SUBLINGUAL_TABLET | SUBLINGUAL | Status: DC | PRN

## 2010-12-29 NOTE — Telephone Encounter (Signed)
Prescription refill sent to the pharmacy 

## 2011-01-04 ENCOUNTER — Telehealth: Payer: Self-pay | Admitting: Gastroenterology

## 2011-01-04 NOTE — Telephone Encounter (Signed)
Patient states that the pharmacy gave her the tablets and not the SL version of her medicine. She wants to know if she can take this tablet instead. I told her they should work the same as long as the medication bottle states 0.125mg . Pt states it does and will call the pharmacy with any other issues with this medication.

## 2011-01-07 ENCOUNTER — Other Ambulatory Visit: Payer: Self-pay | Admitting: Gastroenterology

## 2011-01-07 ENCOUNTER — Telehealth: Payer: Self-pay | Admitting: Internal Medicine

## 2011-01-07 NOTE — Telephone Encounter (Signed)
Ok to d/c Qnasl and to stay on prednisone 40 mg x 2 days, 30 mg daily, then see me.

## 2011-01-07 NOTE — Telephone Encounter (Signed)
Spoke with pt's spouse and notified of these recs per her request. He verbalized understanding and will relay msg to pt.

## 2011-01-07 NOTE — Telephone Encounter (Signed)
Called and spoke with pt.  Pt is scheduled to see Cy on Monday 7/30 but wanted to give Cy an update.  PT states yesterday she noticed " a tickle in throat" and having to clear throat often.  Also states she has thick yellow nasal drainage when doing sinus rinses, PND, non productive cough, wheezing, tightness in chest, and sob.  Therefore pt states she increased her prednisone yesterday to 40mg  and states she is feeling a little better.  Pt states she also took 2 Levsin tabs thinking that these symptoms were coming from her acid reflux.  Also, pt states she is now out of Qnasl samples but states she didn't see a difference in her symptoms while on the Qnasl.  Pt is requesting CY's recs.  Please advise. Thanks. Allergies  Allergen Reactions  . Aspirin

## 2011-01-08 ENCOUNTER — Other Ambulatory Visit: Payer: Self-pay | Admitting: Internal Medicine

## 2011-01-08 MED ORDER — BECLOMETHASONE DIPROPIONATE 80 MCG/ACT NA AERS: 1.0000 | INHALATION_SPRAY | Freq: Every day | NASAL | Status: DC

## 2011-01-08 NOTE — Telephone Encounter (Signed)
Per CY-okay to give Qnasal #1 1-2 sprays in each nostril once daily with prn refills.

## 2011-01-08 NOTE — Telephone Encounter (Signed)
Pt was given sample of Qnasl at 11/13/2010 OV and is now requesting rx be sent to her pharmacy. Pls advise if pt should continue on this medication and give directions for use. Allergies  Allergen Reactions  . Aspirin

## 2011-01-08 NOTE — Telephone Encounter (Signed)
RX sent to pharmancy - LMOMTCB to inform pt of this.

## 2011-01-11 ENCOUNTER — Ambulatory Visit (INDEPENDENT_AMBULATORY_CARE_PROVIDER_SITE_OTHER): Payer: Managed Care, Other (non HMO) | Admitting: Internal Medicine

## 2011-01-11 ENCOUNTER — Encounter: Payer: Self-pay | Admitting: Internal Medicine

## 2011-01-11 VITALS — BP 112/78 | HR 87 | Ht 69.0 in | Wt 161.2 lb

## 2011-01-11 DIAGNOSIS — K219 Gastro-esophageal reflux disease without esophagitis: Secondary | ICD-10-CM

## 2011-01-11 DIAGNOSIS — J45909 Unspecified asthma, uncomplicated: Secondary | ICD-10-CM

## 2011-01-11 DIAGNOSIS — J330 Polyp of nasal cavity: Secondary | ICD-10-CM

## 2011-01-11 MED ORDER — PREDNISONE 5 MG PO TABS: ORAL_TABLET | ORAL | Status: DC

## 2011-01-11 NOTE — Progress Notes (Signed)
Subjective:    Patient ID: [name] [name], female    DOB: [birth year], 60 y.o.   MRN: [id number]  HPI    Review of Systems     Objective:   Physical Exam        Assessment & Plan:   No problem-specific assessment & plan notes found for this encounter.  Subjective:    Patient ID: [name] [name], female    DOB: 1950/09/15, 60 y.o.   MRN: [id number]  HPI    Review of Systems     Objective:   Physical Exam        Assessment & Plan:   Subjective:    Patient ID: [name] [name], female    DOB: 1950/08/12, 60 y.o.   MRN: [id number]  HPI 10/14/10- 60 yoF with asthma, allergic rhinitis.  Last here August 31, 2010- Because of problems with GERD and yeast we were hoping to reduce prednisone which she has been on for much of the time since the 1970's. She has maintained mostly on 5 mg daily. We gave 1 mg tabs, but she didn't feel that 4 mg held her.  Heart burn is better and congestion a bit worse. Pollen season usually gets her some congested. She had been on Xolair x 3 years, then wheezed worse and it was stopped.  Today she feels ok, but changes day-to-day. Main trigger for her she thinks is temperature change/ weather. Beach trip - wheezed in [place].   11/13/10- Asthma, allergic rhinitis/ recurrent sinusitis Called last week and we had her take prednisone 10 mg daily x 7 days then go back to 5 mg daily. She says 5 mg isn't enough. Post nasal drip is getting in her lungs and requiring use of rescue inhaler several times daily. Denies dyspnea or headache. Occasional throat clearing of yellow mucus. Little sustained wheeze, just feels tight.  01/11/11- 60 yoF former smoker, followed for asthma, allergic rhinitis/ hx nasal polyps/ aspirin allergy, recurrent sinusitis complicated by GERD Maintenance prednisone was on 5 mg daily, then went up to 40 and now back to 20/day.  Feels fine now, but last week throat tickle progressed into wheeze and she called [place]. Moved  [place], [place] home, [place], basement, carpet. Says there was mold , but she can't smell so she can't tell. Carpet was replaced and visible leak fixed. Also aware of reflux- diet controlled and continues Zegerid.    Review of Systems Per HPI Constitutional:   No weight loss, night sweats,  Fevers, chills, fatigue, lassitude. HEENT:   No headaches,  Difficulty swallowing,  Tooth/dental problems,  Sore throat,                No sneezing, itching, ear ache,   CV:  No chest pain,  Orthopnea, PND, swelling in lower extremities, anasarca, dizziness, palpitations  GI  No-, abdominal pain, nausea, vomiting, diarrhea, change in bowel habits, loss of appetite  Resp: No shortness of breath with exertion or at rest.  No excess mucus, No coughing up of blood.  No change in color of mucus.  No wheezing.    Skin: no rash or lesions.  GU: no dysuria, change in color of urine, no urgency or frequency.  No flank pain.  MS:  No joint pain or swelling.  No decreased range of motion.  No back pain.  Psych:  No change in mood or affect. No depression or anxiety.  No memory loss.      Objective:  Physical Exam General- Alert, Oriented, Affect-appropriate, Distress- none acute     Skin- rash-none, lesions- none, excoriation- none  Pink cheeks Lymphadenopathy- none  Head- atraumatic  Eyes- Gross vision intact, PERRLA, conjunctivae clear secretions  Ears- Normal-Hearing, canals, Tm   Nose- Clear, No-Septal dev, mucus, polyps, erosion, perforation  Minimally nasal speech  Throat- Mallampati II ,  , drainage- none, tonsils- atrophic     Neck- flexible , trachea midline, no stridor , thyroid nl, carotid no bruit  Chest - symmetrical excursion , unlabored     Heart/CV- RRR , no murmur , no gallop  , no rub, nl s1 s2                     - JVD- none , edema- none, stasis changes- none, varices- none     Lung- clear to P&A, wheeze- none, cough- none , dullness-none, rub- none          no wheeze or cough  at all today     Chest wall-  Abd- tender-no, distended-no, bowel sounds-present, HSM- no  Br/ Gen/ Rectal- Not done, not indicated  Extrem- cyanosis- none, clubbing, none, atrophy- none, strength- nl  Neuro- grossly intact to observation         Assessment & Plan:

## 2011-01-11 NOTE — Assessment & Plan Note (Addendum)
Good control but on steroids. She will finsih taper back to 5 mg daily. Unfortunatly failed xolair, or thought she did.

## 2011-01-11 NOTE — Patient Instructions (Signed)
Finish tapering prednisone back down to 5 mg daily  Script prednisone   Look hard at the mold/ mildew/dust exposure in your home. Print information given.

## 2011-01-11 NOTE — Assessment & Plan Note (Signed)
No recurrence seen despite somewhat nasal quality of speech

## 2011-01-11 NOTE — Assessment & Plan Note (Signed)
Cautioned to continue reflux precautions,

## 2011-01-14 NOTE — Telephone Encounter (Signed)
Pt aware.

## 2011-01-29 ENCOUNTER — Other Ambulatory Visit: Payer: Self-pay | Admitting: Internal Medicine

## 2011-01-29 NOTE — Telephone Encounter (Signed)
Please advise if okay to refill abx requested.

## 2011-02-01 NOTE — Telephone Encounter (Signed)
Ok to refill Omnicef.

## 2011-03-15 ENCOUNTER — Telehealth: Payer: Self-pay | Admitting: Internal Medicine

## 2011-03-15 MED ORDER — PREDNISONE 10 MG PO TABS: 10.0000 mg | ORAL_TABLET | Freq: Every day | ORAL | Status: AC

## 2011-03-15 NOTE — Telephone Encounter (Signed)
LMOMTCB x 1 

## 2011-03-15 NOTE — Telephone Encounter (Signed)
ATC pt cell # and since she has magic jack and it states she can't receive my call. I called the house # listed and her husband states that was an incorrect #. The # is [phone]. I had to [facility] at cell #.

## 2011-03-15 NOTE — Telephone Encounter (Signed)
The pt c/o recurring thick yellow nasal drainage and wheezing and PND. She had recent abx course and Prednisone taper and her sinuses seemed to clear up for a very short time. However, since back down on 5 mg of Prednisone daily and off abx for a week or so her symptoms have returned. She denies any f/c/s, no dyspnea or headaches. Pls advise. Allergies  Allergen Reactions  . Aspirin

## 2011-03-15 NOTE — Telephone Encounter (Signed)
Pt returning call & can be reached at [phone].  [name] [name]

## 2011-03-15 NOTE — Telephone Encounter (Signed)
Pt is aware of Prednisone rx and had [place] call in the new rx to [facility] in [place].

## 2011-03-15 NOTE — Telephone Encounter (Signed)
Per CDY, lets try 10mg  prednisone daily. See what that does.  Does she have enough.  If she needs rx - prednisone 10mg  # 30 1 daily refill x 3

## 2011-03-15 NOTE — Telephone Encounter (Signed)
lmomtcb  

## 2011-03-15 NOTE — Telephone Encounter (Signed)
Pt returned call & can be reached at (cell) [phone].  Antionette Fairy

## 2011-03-15 NOTE — Telephone Encounter (Signed)
Pt returned call. Call [phone]. [name] [name]

## 2011-03-22 LAB — CBC
HCT: 36
Hemoglobin: 12
MCHC: 33.2
RDW: 12.9

## 2011-03-22 LAB — BASIC METABOLIC PANEL
CO2: 25
Glucose, Bld: 117 — ABNORMAL HIGH
Potassium: 3.6
Sodium: 144

## 2011-03-22 LAB — POCT CARDIAC MARKERS: Troponin i, poc: <0.05

## 2011-03-22 LAB — D-DIMER, QUANTITATIVE: D-Dimer, Quant: 1.47 — ABNORMAL HIGH

## 2011-03-22 LAB — DIFFERENTIAL
Basophils Absolute: 0.1
Basophils Relative: 1
Eosinophils Relative: 2
Lymphocytes Relative: 36
Monocytes Absolute: 0.5
Monocytes Relative: 7

## 2011-04-01 LAB — HM PAP SMEAR: HM Pap smear: NORMAL

## 2011-04-13 ENCOUNTER — Encounter: Payer: Self-pay | Admitting: Internal Medicine

## 2011-04-13 ENCOUNTER — Ambulatory Visit (INDEPENDENT_AMBULATORY_CARE_PROVIDER_SITE_OTHER): Payer: Managed Care, Other (non HMO) | Admitting: Internal Medicine

## 2011-04-13 VITALS — BP 108/72 | HR 87 | Ht 69.0 in | Wt 172.8 lb

## 2011-04-13 DIAGNOSIS — J301 Allergic rhinitis due to pollen: Secondary | ICD-10-CM

## 2011-04-13 DIAGNOSIS — J329 Chronic sinusitis, unspecified: Secondary | ICD-10-CM

## 2011-04-13 NOTE — Progress Notes (Signed)
Subjective:    Patient ID: [name] [name], female    DOB: 04/27/1951, 60 y.o.   MRN: [id number]  HPI    Review of Systems     Objective:   Physical Exam        Assessment & Plan:   No problem-specific assessment & plan notes found for this encounter.  Subjective:    Patient ID: [name] [name], female    DOB: [birth year], 60 y.o.   MRN: [id number]  HPI    Review of Systems     Objective:   Physical Exam        Assessment & Plan:   Subjective:    Patient ID: [name] [name], female    DOB: May 09, 1951, 60 y.o.   MRN: [id number]  HPI 10/14/10- 60 yoF with asthma, allergic rhinitis.  Last here August 31, 2010- Because of problems with GERD and yeast we were hoping to reduce prednisone which she has been on for much of the time since the 1970's. She has maintained mostly on 5 mg daily. We gave 1 mg tabs, but she didn't feel that 4 mg held her.  Heart burn is better and congestion a bit worse. Pollen season usually gets her some congested. She had been on Xolair x 3 years, then wheezed worse and it was stopped.  Today she feels ok, but changes day-to-day. Main trigger for her she thinks is temperature change/ weather. [place] trip - wheezed in [place].   11/13/10- Asthma, allergic rhinitis/ recurrent sinusitis Called last week and we had her take prednisone 10 mg daily x 7 days then go back to 5 mg daily. She says 5 mg isn't enough. Post nasal drip is getting in her lungs and requiring use of rescue inhaler several times daily. Denies dyspnea or headache. Occasional throat clearing of yellow mucus. Little sustained wheeze, just feels tight.  01/11/11- 60 yoF former smoker, followed for asthma, allergic rhinitis/ hx nasal polyps/ aspirin allergy, recurrent sinusitis complicated by GERD Maintenance prednisone was on 5 mg daily, then went up to 40 and now back to 20/day.  Feels fine now, but last week throat tickle progressed into wheeze and she called [place]. Moved  [place], [place] home, [place], basement, carpet. Says there was mold , but she can't smell so she can't tell. Carpet was replaced and visible leak fixed. Also aware of reflux- diet controlled and continues Zegerid.   04/13/11- 60 yoF former smoker, followed for asthma, allergic rhinitis/ hx nasal polyps/ aspirin allergy, recurrent sinusitis complicated by GERD And she declines flu vaccine. She tried Xolair injections for 3 years with no benefit. At last visit we tried tapering prednisone to maintenance 5 mg daily. She developed increasing nasal congestion and postnasal drainage so he went back up to 10 mg of prednisone daily. With that she feels better but she says that is not enough prednisone to restore her sense of smell. Using rescue inhaler about one time daily for tickle and chest tightness. Daily Water Pik for her nose returns some yellow material. She does not feel that she has a sinus infection. She has not had recent followup with [name] ENT and was to see him again as needed. He had told her her nasal problems were allergy  .+nasal congestion, post nasal drip,  CV:  No-   chest pain, orthopnea, PND, swelling in lower extremities, anasarca, dizziness, palpitations Resp: No-   shortness of breath with exertion or at rest.  No-   productive cough,  No non-productive cough,  No- coughing up of blood.              No-   change in color of mucus.  No- wheezing.   Skin: No-   rash or lesions. GI:  No-   heartburn, indigestion, abdominal pain, nausea, vomiting, diarrhea,                 change in bowel habits, loss of appetite GU: No-   dysuria, change in color of urine, no urgency or frequency.  No- flank pain. MS:  No-   joint pain or swelling.  No- decreased range of motion.  No- back pain. Neuro-     nothing unusual Psych:  No- change in mood or affect. No depression or anxiety.  No memory loss.      Objective:   Physical Exam General- Alert, Oriented, Affect-appropriate,  Distress- none acute Skin- rash-none, lesions- none, excoriation- none Lymphadenopathy- none Head- atraumatic            Eyes- Gross vision intact, PERRLA, conjunctivae clear secretions            Ears- Hearing, canals-normal            Nose- Clear, but question polyps far back in her nose, no-Septal dev, mucus,erosion, perforation             Throat- Mallampati II , mucosa clear , drainage- none, tonsils- atrophic Neck- flexible , trachea midline, no stridor , thyroid nl, carotid no bruit Chest - symmetrical excursion , unlabored           Heart/CV- RRR , no murmur , no gallop  , no rub, nl s1 s2                           - JVD- none , edema- none, stasis changes- none, varices- none           Lung- clear to P&A, wheeze- none, cough- none , dullness-none, rub- none           Chest wall-  Abd- tender-no, distended-no, bowel sounds-present, HSM- no Br/ Gen/ Rectal- Not done, not indicated Extrem- cyanosis- none, clubbing, none, atrophy- none, strength- nl Neuro- grossly intact to observation

## 2011-04-13 NOTE — Patient Instructions (Addendum)
Sample Zetonna nasal spray-  1 or 2 puffs each nostril once daily  If you don't get better, then suggest you see Dr Lazarus Salines again.   We could try again with allergy shots if needed

## 2011-04-14 NOTE — Assessment & Plan Note (Signed)
We're going to try a new nasal steroid but I have suggested if it is not helpful, but she go back to [name].

## 2011-04-14 NOTE — Assessment & Plan Note (Signed)
We discussed available management including nasal steroids, saline lavage, returned to allergy vaccine. If this were a significant IgE problem I would hope for more response from Xolair.

## 2011-05-17 ENCOUNTER — Ambulatory Visit: Payer: Managed Care, Other (non HMO) | Admitting: Internal Medicine

## 2011-07-28 ENCOUNTER — Other Ambulatory Visit: Payer: Self-pay | Admitting: Gastroenterology

## 2011-08-10 ENCOUNTER — Encounter: Payer: Self-pay | Admitting: Gastroenterology

## 2011-08-16 ENCOUNTER — Other Ambulatory Visit: Payer: Self-pay | Admitting: Gastroenterology

## 2011-08-16 ENCOUNTER — Other Ambulatory Visit: Payer: Self-pay | Admitting: Internal Medicine

## 2011-08-16 ENCOUNTER — Telehealth: Payer: Self-pay | Admitting: Gastroenterology

## 2011-08-16 ENCOUNTER — Telehealth: Payer: Self-pay | Admitting: Internal Medicine

## 2011-08-16 NOTE — Telephone Encounter (Signed)
Spoke with man of house-understands to let patient know that we have taken care of request.

## 2011-08-17 MED ORDER — OMEPRAZOLE-SODIUM BICARBONATE 40-1100 MG PO CAPS: ORAL_CAPSULE | ORAL | Status: DC

## 2011-08-17 NOTE — Telephone Encounter (Signed)
Sent one refill to patient's pharmacy until scheduled appt. 

## 2011-08-18 ENCOUNTER — Other Ambulatory Visit: Payer: Self-pay | Admitting: Gastroenterology

## 2011-08-19 ENCOUNTER — Other Ambulatory Visit: Payer: Self-pay | Admitting: Gastroenterology

## 2011-08-19 MED ORDER — HYOSCYAMINE SULFATE 0.125 MG SL SUBL: 0.1250 mg | SUBLINGUAL_TABLET | SUBLINGUAL | Status: DC | PRN

## 2011-08-19 NOTE — Telephone Encounter (Signed)
Rx sent 

## 2011-08-23 ENCOUNTER — Telehealth: Payer: Self-pay | Admitting: Internal Medicine

## 2011-08-23 MED ORDER — AZITHROMYCIN 250 MG PO TABS: ORAL_TABLET | ORAL | Status: AC

## 2011-08-23 MED ORDER — PREDNISONE 10 MG PO TABS: ORAL_TABLET | ORAL | Status: DC

## 2011-08-23 NOTE — Telephone Encounter (Signed)
Pt states she started feeling bad on Friday and using Mucinex-feels little better; cough-non productive, nasal drainage-yellow in color, slight chest tightness, sounds of wheezing while on phone with patient.    Allergies  Allergen Reactions  . Aspirin    Please advise.

## 2011-08-23 NOTE — Telephone Encounter (Signed)
Per CDY, send Zpak and Prednisone 10mg  #20, 4 x 2d, 3 x 2d, 2 x 2d, 1 x 2d then stop. LMOM TCB for pt so she may be notified. Prescriptions sent to the pharmacy, [facility] [place].

## 2011-08-23 NOTE — Telephone Encounter (Signed)
Pt notified of prescriptions and will call if she is not improving.

## 2011-09-01 ENCOUNTER — Ambulatory Visit: Payer: Managed Care, Other (non HMO) | Admitting: Gastroenterology

## 2011-09-07 ENCOUNTER — Ambulatory Visit (INDEPENDENT_AMBULATORY_CARE_PROVIDER_SITE_OTHER): Payer: Managed Care, Other (non HMO) | Admitting: Gastroenterology

## 2011-09-07 ENCOUNTER — Encounter: Payer: Self-pay | Admitting: Gastroenterology

## 2011-09-07 VITALS — BP 110/70 | HR 100 | Ht 70.0 in | Wt 178.4 lb

## 2011-09-07 DIAGNOSIS — Z860100 Personal history of colon polyps, unspecified: Secondary | ICD-10-CM

## 2011-09-07 DIAGNOSIS — K219 Gastro-esophageal reflux disease without esophagitis: Secondary | ICD-10-CM

## 2011-09-07 MED ORDER — MOVIPREP 100 G PO SOLR: 1.0000 | Freq: Once | ORAL | Status: DC

## 2011-09-07 MED ORDER — ESOMEPRAZOLE MAGNESIUM 40 MG PO CPDR: 40.0000 mg | DELAYED_RELEASE_CAPSULE | Freq: Two times a day (BID) | ORAL | Status: DC

## 2011-09-07 NOTE — Patient Instructions (Addendum)
You have been scheduled for a colonoscopy with propofol. Please follow written instructions given to you at your visit today.  Please pick up your prep kit at the pharmacy within the next 1-3 days. We have sent the following medications to your pharmacy for you to pick up at your convenience:Nexium one tablet by mouth twice daily.  cc: Billee Cashing, MD

## 2011-09-07 NOTE — Progress Notes (Signed)
History of Present Illness: This is a 61 year old female with chronic GERD and a history of a peptic stricture. She has frequent breakthrough reflux symptoms that are almost always related to dietary stressors. She feels that Zegerid is not as effective as she would like it to be. She has occasional episodes of crampy epigastric pain and cramping lower substernal pain that are promptly relieved with either milk or hyoscyamine. Denies weight loss, abdominal pain, constipation, diarrhea, change in stool caliber, melena, hematochezia, nausea, vomiting, dysphagia.  Current Medications, Allergies, Past Medical History, Past Surgical History, Family History and Social History were reviewed in [employer] record.  Physical Exam: General: Well developed , well nourished, no acute distress Head: Normocephalic and atraumatic Eyes:  sclerae anicteric, EOMI Ears: Normal auditory acuity Mouth: No deformity or lesions Lungs: Clear throughout to auscultation Heart: Regular rate and rhythm; no murmurs, rubs or bruits Abdomen: Soft, non tender and non distended. No masses, hepatosplenomegaly or hernias noted. Normal Bowel sounds Musculoskeletal: Symmetrical with no gross deformities  Pulses:  Normal pulses noted Extremities: No clubbing, cyanosis, edema or deformities noted Neurological: Alert oriented x 4, grossly nonfocal Psychological:  Alert and cooperative. Normal mood and affect  Assessment and Recommendations:  1. GERD with a history of a peptic stricture. Symptoms not adequately controlled. Change to Nexium 40 mg twice a day. Intensify all antireflux measures. She will need to avoid or at least minimize dietary stressors.  2. Personal history of  tubulovillous adenomatous colon polyps. She is due for her 3-year surveillance colonoscopy. Schedule colonoscopy with propofol. The risks, benefits, and alternatives to colonoscopy with possible biopsy and possible polypectomy were  discussed with the patient and they consent to proceed.

## 2011-09-08 ENCOUNTER — Encounter: Payer: Self-pay | Admitting: Gastroenterology

## 2011-09-20 ENCOUNTER — Telehealth: Payer: Self-pay | Admitting: Gastroenterology

## 2011-09-20 MED ORDER — OMEPRAZOLE 40 MG PO CPDR: 40.0000 mg | DELAYED_RELEASE_CAPSULE | Freq: Two times a day (BID) | ORAL | Status: DC

## 2011-09-20 NOTE — Telephone Encounter (Signed)
Informed patient that I sent a alternative medication to her pharmacy but if this is too expensive as well then call me back. Pt agreed and verbalized understanding.

## 2011-10-14 ENCOUNTER — Ambulatory Visit: Payer: Managed Care, Other (non HMO) | Admitting: Internal Medicine

## 2011-10-14 ENCOUNTER — Other Ambulatory Visit: Payer: Self-pay | Admitting: Obstetrics and Gynecology

## 2011-10-19 ENCOUNTER — Encounter: Payer: Self-pay | Admitting: Gastroenterology

## 2011-10-19 ENCOUNTER — Ambulatory Visit (AMBULATORY_SURGERY_CENTER): Payer: Managed Care, Other (non HMO) | Admitting: Gastroenterology

## 2011-10-19 VITALS — BP 132/90 | HR 82 | Temp 98.0°F | Resp 15 | Ht 70.0 in | Wt 178.0 lb

## 2011-10-19 DIAGNOSIS — Z1211 Encounter for screening for malignant neoplasm of colon: Secondary | ICD-10-CM

## 2011-10-19 DIAGNOSIS — Z860100 Personal history of colon polyps, unspecified: Secondary | ICD-10-CM

## 2011-10-19 DIAGNOSIS — D126 Benign neoplasm of colon, unspecified: Secondary | ICD-10-CM

## 2011-10-19 MED ORDER — SODIUM CHLORIDE 0.9 % IV SOLN: 500.0000 mL | INTRAVENOUS | Status: DC

## 2011-10-19 NOTE — Patient Instructions (Signed)
YOU HAD AN ENDOSCOPIC PROCEDURE TODAY AT THE [facility]: Refer to the procedure report that was given to you for any specific questions about what was found during the examination.  If the procedure report does not answer your questions, please call your gastroenterologist to clarify.  If you requested that your care partner not be given the details of your procedure findings, then the procedure report has been included in a sealed envelope for you to review at your convenience later.  YOU SHOULD EXPECT: Some feelings of bloating in the abdomen. Passage of more gas than usual.  Walking can help get rid of the air that was put into your GI tract during the procedure and reduce the bloating. If you had a lower endoscopy (such as a colonoscopy or flexible sigmoidoscopy) you may notice spotting of blood in your stool or on the toilet paper. If you underwent a bowel prep for your procedure, then you may not have a normal bowel movement for a few days.  DIET: Your first meal following the procedure should be a light meal and then it is ok to progress to your normal diet.  A half-sandwich or bowl of soup is an example of a good first meal.  Heavy or fried foods are harder to digest and may make you feel nauseous or bloated.  Likewise meals heavy in dairy and vegetables can cause extra gas to form and this can also increase the bloating.  Drink plenty of fluids but you should avoid alcoholic beverages for 24 hours.  ACTIVITY: Your care partner should take you home directly after the procedure.  You should plan to take it easy, moving slowly for the rest of the day.  You can resume normal activity the day after the procedure however you should NOT DRIVE or use heavy machinery for 24 hours (because of the sedation medicines used during the test).    SYMPTOMS TO REPORT IMMEDIATELY: A gastroenterologist can be reached at any hour.  During normal business hours, 8:30 AM to 5:00 PM Monday through Friday,  call [phone].  After hours and on weekends, please call the GI answering service at [phone] who will take a message and have the physician on call contact you.   Following lower endoscopy (colonoscopy or flexible sigmoidoscopy):  Excessive amounts of blood in the stool  Significant tenderness or worsening of abdominal pains  Swelling of the abdomen that is new, acute  Fever of 100F or higher  Following upper endoscopy (EGD)  Vomiting of blood or coffee ground material  New chest pain or pain under the shoulder blades  Painful or persistently difficult swallowing  New shortness of breath  Fever of 100F or higher  Black, tarry-looking stools  FOLLOW UP: If any biopsies were taken you will be contacted by phone or by letter within the next 1-3 weeks.  Call your gastroenterologist if you have not heard about the biopsies in 3 weeks.  Our staff will call the home number listed on your records the next business day following your procedure to check on you and address any questions or concerns that you may have at that time regarding the information given to you following your procedure. This is a courtesy call and so if there is no answer at the home number and we have not heard from you through the emergency physician on call, we will assume that you have returned to your regular daily activities without incident.  SIGNATURES/CONFIDENTIALITY: You and/or your care   partner have signed paperwork which will be entered into your electronic medical record.  These signatures attest to the fact that that the information above on your After Visit Summary has been reviewed and is understood.  Full responsibility of the confidentiality of this discharge information lies with you and/or your care-partner.  

## 2011-10-19 NOTE — Progress Notes (Signed)
Patient did not experience any of the following events: a burn prior to discharge; a fall within the facility; wrong site/side/patient/procedure/implant event; or a hospital transfer or hospital admission upon discharge from the facility. (G8907) Patient did not have preoperative order for IV antibiotic SSI prophylaxis. (G8918)  

## 2011-10-19 NOTE — Op Note (Signed)
[facility] [address] [place], [place]  [address]  COLONOSCOPY PROCEDURE REPORT  PATIENT:  Fatou, Dunnigan  MR#:  #[id number] BIRTHDATE:  18-Oct-1950, 60 yrs. old  GENDER:  female ENDOSCOPIST:  Judie Petit T. Russella Dar, MD, [facility]  PROCEDURE DATE:  10/19/2011 PROCEDURE:  Colonoscopy with biopsy and snare polypectomy ASA CLASS:  Class II INDICATIONS:  1) surveillance and high-risk screening  2) history of pre-cancerous (adenomatous) colon polyps: TVA 08/2008 MEDICATIONS:   MAC sedation, administered by CRNA, propofol (Diprivan) 350 mg IV DESCRIPTION OF PROCEDURE:   After the risks benefits and alternatives of the procedure were thoroughly explained, informed consent was obtained.  Digital rectal exam was performed and revealed no abnormalities.   The LB CF-[id number] endoscope was introduced through the anus and advanced to the cecum, which was identified by both the appendix and ileocecal valve, without limitations.  The quality of the prep was adequate, using MoviPrep.  The instrument was then slowly withdrawn as the colon was fully examined. <<PROCEDUREIMAGES>> FINDINGS:  A sessile polyp was found in the mid transverse colon. It was 4 mm in size. The polyp was removed using cold biopsy forceps.  A sessile polyp was found in the descending colon. It was 5 mm in size. Polyp was snared without cautery. Retrieval was successful. Mild diverticulosis was found in the sigmoid colon. Otherwise normal colonoscopy without other polyps, masses, vascular ectasias, or inflammatory changes. Retroflexed views in the rectum revealed no abnormalities.  The time to cecum =  4.33 minutes. The scope was then withdrawn (time =  9  min) from the patient and the procedure completed.  COMPLICATIONS:  None  ENDOSCOPIC IMPRESSION: 1) 4 mm sessile polyp in the mid transverse colon 2) 5 mm sessile polyp in the descending colon 3) Mild diverticulosis in the sigmoid colon  RECOMMENDATIONS: 1)  High fiber diet with liberal fluid intake. 2) Await pathology results 3) Repeat Colonoscopy in 3 years.  Venita Lick. Russella Dar, MD, [facility]  CC:  Billee Cashing MD  n. Rosalie DoctorVenita Lick. Matasha Smigelski at 10/19/2011 12:02 PM  Rennis Petty, #[phone]

## 2011-10-20 ENCOUNTER — Telehealth: Payer: Self-pay | Admitting: *Deleted

## 2011-10-20 NOTE — Telephone Encounter (Signed)
Left message

## 2011-10-25 ENCOUNTER — Encounter: Payer: Self-pay | Admitting: Gastroenterology

## 2011-11-04 ENCOUNTER — Other Ambulatory Visit: Payer: Self-pay | Admitting: Gastroenterology

## 2011-11-04 DIAGNOSIS — J45909 Unspecified asthma, uncomplicated: Secondary | ICD-10-CM | POA: Insufficient documentation

## 2011-11-04 DIAGNOSIS — J4540 Moderate persistent asthma, uncomplicated: Secondary | ICD-10-CM | POA: Insufficient documentation

## 2011-11-11 ENCOUNTER — Ambulatory Visit: Payer: Managed Care, Other (non HMO) | Admitting: Internal Medicine

## 2011-11-16 ENCOUNTER — Encounter: Payer: Self-pay | Admitting: Internal Medicine

## 2011-11-16 ENCOUNTER — Ambulatory Visit (INDEPENDENT_AMBULATORY_CARE_PROVIDER_SITE_OTHER): Payer: Managed Care, Other (non HMO) | Admitting: Internal Medicine

## 2011-11-16 VITALS — BP 116/70 | HR 97 | Ht 69.0 in | Wt 178.0 lb

## 2011-11-16 DIAGNOSIS — J45909 Unspecified asthma, uncomplicated: Secondary | ICD-10-CM

## 2011-11-16 DIAGNOSIS — J330 Polyp of nasal cavity: Secondary | ICD-10-CM

## 2011-11-16 DIAGNOSIS — J301 Allergic rhinitis due to pollen: Secondary | ICD-10-CM

## 2011-11-16 MED ORDER — PREDNISONE 10 MG PO TABS: 10.0000 mg | ORAL_TABLET | Freq: Every day | ORAL | Status: DC

## 2011-11-16 NOTE — Progress Notes (Signed)
Subjective:    Patient ID: [name] [name], female    DOB: [birth year], 61 y.o.   MRN: [id number]  HPI 10/14/10- 66 yoF with asthma, allergic rhinitis.  Last here August 31, 2010- Because of problems with GERD and yeast we were hoping to reduce prednisone which she has been on for much of the time since the 1970's. She has maintained mostly on 5 mg daily. We gave 1 mg tabs, but she didn't feel that 4 mg held her.  Heart burn is better and congestion a bit worse. Pollen season usually gets her some congested. She had been on Xolair x 3 years, then wheezed worse and it was stopped.  Today she feels ok, but changes day-to-day. Main trigger for her she thinks is temperature change/ weather. [place] trip - wheezed in [place] [employer].   11/13/10- Asthma, allergic rhinitis/ recurrent sinusitis Called last week and we had her take prednisone 10 mg daily x 7 days then go back to 5 mg daily. She says 5 mg isn't enough. Post nasal drip is getting in her lungs and requiring use of rescue inhaler several times daily. Denies dyspnea or headache. Occasional throat clearing of yellow mucus. Little sustained wheeze, just feels tight.  01/11/11- 59 yoF former smoker, followed for asthma, allergic rhinitis/ hx nasal polyps/ aspirin allergy, recurrent sinusitis complicated by GERD Maintenance prednisone was on 5 mg daily, then went up to 40 and now back to 20/day.  Feels fine now, but last week throat tickle progressed into wheeze and she called [place]. [place] home[place], basement, carpet. Says there was mold , but she can't smell so she can't tell. Carpet was replaced and visible leak fixed. Also aware of reflux- diet controlled and continues Zegerid.   04/13/11- 59 yoF former smoker, followed for asthma, allergic rhinitis/ hx nasal polyps/ aspirin allergy, recurrent sinusitis complicated by GERD And she declines flu vaccine. She tried Xolair injections for 3 years with no benefit. At last visit we tried tapering  prednisone to maintenance 5 mg daily. She developed increasing nasal congestion and postnasal drainage so she went back up to 10 mg of prednisone daily. With that she feels better but she says that is not enough prednisone to restore her sense of smell. Using rescue inhaler about one time daily for tickle and chest tightness. Daily Water Pik for her nose returns some yellow material. She does not feel that she has a sinus infection. She has not had recent followup with [name] ENT and was to see him again as needed. He had told her her nasal problems were allergy  .+nasal congestion, post nasal drip,  11/16/11-  60 yoF former smoker, followed for asthma, allergic rhinitis/ hx nasal polyps/ aspirin allergy, recurrent sinusitis complicated by GERD She is past the spring seasonal rhinitis time for her. Had a skin rash diagnosed as "allergic" at an urgent care they increase prednisone and gave cortisone cream but she is now back to her maintenance prednisone 10 mg daily. The rash is better and she feels fairly stable. Denies wheeze or cough. Still no sense of smell even on a prednisone 60 mg taper.  ROS-see HPI Constitutional:   No-   weight loss, night sweats, fevers, chills, fatigue, lassitude. HEENT:   No-  headaches, difficulty swallowing, tooth/dental problems, sore throat,       No-  sneezing, itching, ear ache, nasal congestion, post nasal drip, +anosmia CV:  No-   chest pain, orthopnea, PND, swelling in  lower extremities, anasarca, dizziness, palpitations Resp: No-   shortness of breath with exertion or at rest.              No-   productive cough,  No non-productive cough,  No- coughing up of blood.              No-   change in color of mucus.  No- wheezing.   Skin: No-   rash or lesions. GI:  No-   heartburn, indigestion, abdominal pain, nausea, vomiting,  GU: . MS:  No-   joint pain or swelling.. Neuro-     nothing unusual Psych:  No- change in mood or affect. No depression or anxiety.   No memory loss.  Objective:   Physical Exam General- Alert, Oriented, Affect-appropriate, Distress- none acute Skin- rash-none, lesions- none, excoriation- none Lymphadenopathy- none Head- atraumatic            Eyes- Gross vision intact, PERRLA, conjunctivae clear secretions            Ears- Hearing, canals-normal            Nose- Clear, but question polyps far back in her nose, no-Septal dev, mucus,erosion, perforation             Throat- Mallampati II , mucosa clear , drainage- none, tonsils- atrophic Neck- flexible , trachea midline, no stridor , thyroid nl, carotid no bruit Chest - symmetrical excursion , unlabored           Heart/CV- RRR , no murmur , no gallop  , no rub, nl s1 s2                           - JVD- none , edema- none, stasis changes- none, varices- none           Lung- clear to P&A, wheeze- none, cough- none , dullness-none, rub- none           Chest wall-  Abd-  Br/ Gen/ Rectal- Not done, not indicated Extrem- cyanosis- none, clubbing, none, atrophy- none, strength- nl Neuro- grossly intact to observation

## 2011-11-16 NOTE — Patient Instructions (Signed)
Script sent for prednisone 10 mg to continue one daily  Sample Dymista nasal spray   1-2 puffs into each nostril once daily at bedtime

## 2011-11-20 NOTE — Assessment & Plan Note (Signed)
Plan-discussed steroid side effects and adrenal insufficiency. Continue maintenance prednisone 10 mg daily.

## 2011-11-20 NOTE — Assessment & Plan Note (Signed)
Controlled. Plan-try sample Dymista

## 2011-11-20 NOTE — Assessment & Plan Note (Signed)
No visible recurrence at this time.

## 2011-11-23 ENCOUNTER — Other Ambulatory Visit: Payer: Self-pay | Admitting: Gastroenterology

## 2011-12-02 ENCOUNTER — Telehealth: Payer: Self-pay | Admitting: Internal Medicine

## 2011-12-02 NOTE — Telephone Encounter (Signed)
Per CY-have patient blow nose in saran wrap and submit for culture and smear; patient states she is out of town tomorrow and will come back in on Tuesday-will come by the office on Wednesday to leave sample with me.

## 2011-12-02 NOTE — Telephone Encounter (Signed)
I spoke with pt and she is wanting to know how long should it take for the dymista to help. She took this x 1 week then stopped and still could not tell a difference. Also when she sprayed 2 sprays in her nose it caused it to burn but one spray did not. She is still doing the saline rinse and has yellow mucus come out when she does. Please advise CDY thanks

## 2011-12-07 ENCOUNTER — Other Ambulatory Visit: Payer: Self-pay | Admitting: Internal Medicine

## 2011-12-07 ENCOUNTER — Other Ambulatory Visit: Payer: Managed Care, Other (non HMO)

## 2011-12-07 DIAGNOSIS — J329 Chronic sinusitis, unspecified: Secondary | ICD-10-CM

## 2011-12-09 ENCOUNTER — Telehealth: Payer: Self-pay | Admitting: Internal Medicine

## 2011-12-09 NOTE — Telephone Encounter (Signed)
Pt returned my call-wanted to know how long she would need to wait for culture results. I informed patient that it could take 4-6 weeks to get results and will call with results as soon as possible. Pt understands.

## 2011-12-09 NOTE — Telephone Encounter (Signed)
LMTCB

## 2011-12-10 ENCOUNTER — Other Ambulatory Visit: Payer: Self-pay | Admitting: Gastroenterology

## 2011-12-10 LAB — RESPIRATORY CULTURE OR RESPIRATORY AND SPUTUM CULTURE

## 2011-12-10 MED ORDER — OMEPRAZOLE 40 MG PO CPDR: 40.0000 mg | DELAYED_RELEASE_CAPSULE | Freq: Two times a day (BID) | ORAL | Status: DC

## 2011-12-10 NOTE — Telephone Encounter (Signed)
Prescription sent to patient's pharmacy.

## 2011-12-13 NOTE — Progress Notes (Signed)
Quick Note:  LMTCB ______ 

## 2011-12-14 ENCOUNTER — Telehealth: Payer: Self-pay | Admitting: Internal Medicine

## 2011-12-14 NOTE — Telephone Encounter (Signed)
Pt returned [place] call about culture results. Pt advised of results as follows: Notes Recorded by Waymon Budge, MD on 12/09/2011 at 5:23 PM Nasal mucus culture is just growing normal airway organisms, not infection. I don't recommend antibiotics for this. Would use saline rinse, like Neti pot, and antihistamine as needed to dry it up.      Pt states that she was told by ENT to not take antihistamine because it was making her nose worse. She states she will try salnie rinses but she also wants to try another nasal spray. She was given dymista at last OV but states this is not working for her and it also burns when she used it. Please advise. [name], CMA Allergies  Allergen Reactions  . Aspirin

## 2011-12-14 NOTE — Telephone Encounter (Signed)
Per CY-okay to let patient come by and get sample of Zetonna 1 puff each nostril daily; please keep [place] posted how she is doing with sample; if she would like RX we can send for her after sample is gone.   Sample with instructions left at front for pick up    LMTCB-need to review this message with patient.

## 2011-12-15 NOTE — Progress Notes (Signed)
Quick Note:  Pt is aware of results-please see phone note 12-14-11. ______

## 2011-12-15 NOTE — Telephone Encounter (Signed)
LMTCBx2. Jennifer Castillo, CMA  

## 2011-12-17 NOTE — Telephone Encounter (Signed)
Pt returned call.  Advised pt that sample of Zetona (1 puff each nostril daily) is waiting up front for her & to please call if pt would like an Rx.  Pt verbalized understanding & stated she will pick the sample up on 12/20/11.  Pt stated nothing further needed at this time.  Antionette Fairy

## 2012-01-14 ENCOUNTER — Telehealth: Payer: Self-pay | Admitting: Internal Medicine

## 2012-01-14 NOTE — Telephone Encounter (Signed)
LMTCBx1.Gurkaran Rahm, CMA  

## 2012-01-17 MED ORDER — AMOXICILLIN-POT CLAVULANATE 875-125 MG PO TABS: 1.0000 | ORAL_TABLET | Freq: Two times a day (BID) | ORAL | Status: AC

## 2012-01-17 NOTE — Telephone Encounter (Signed)
Pt aware rx sent. Pt advised no samples of dymista is available at this time, but I will place note in Katie"s box to remind her to gt a sample when we receive some. Pt aware we will call her once we get sample.[name], CMA

## 2012-01-17 NOTE — Telephone Encounter (Signed)
Spoke with the pt and she states she was given a sample of nasal spray at last OV and this was burning her nose, so she called back and asked for a different medication. She states the sample she was given at that time was the exact same medication that she was given at last OV. OV instruction states the pt was given dymista, but pt states she was given zetona at OV and then again when she called back. Pt is requesting a sample of a different nasal spray besides zetona. Please advise.   Also the pt states she feel slike she is getting a sinus infection. She is c/o sinus congestion, pressure and facial pain. Also c/o nasal congestion with thick yellow mucus. Pt is requesting an antibiotic. Please advise. [name], CMA Allergies  Allergen Reactions  . Aspirin

## 2012-01-17 NOTE — Telephone Encounter (Signed)
Per CY-okay to give sample of Dymista and Augmentin 875mg  #14 take 1 po bid no refills.

## 2012-01-18 ENCOUNTER — Other Ambulatory Visit: Payer: Self-pay | Admitting: Internal Medicine

## 2012-01-18 ENCOUNTER — Telehealth: Payer: Self-pay | Admitting: Internal Medicine

## 2012-01-18 MED ORDER — CEFDINIR 300 MG PO CAPS: 300.0000 mg | ORAL_CAPSULE | Freq: Two times a day (BID) | ORAL | Status: AC

## 2012-01-18 NOTE — Telephone Encounter (Signed)
Per CDY, okay to call in Cefdinir(Omnicef) 300mg  BID #14  x 0-RF sent to [facility]. Left detailed msg on pts machine [phone]) that rx was sent to pharm and to call us if there are any questions.

## 2012-01-18 NOTE — Telephone Encounter (Signed)
I spoke with pt and she states she does not want to take the Augmentin. She stated she has taking this before and thinks it gave her a yeast infection and stated "it caused something else to happen as well" but couldn't remember what. She is requesting cefdinir. Please advise Dr. Maple Hudson thanks  Allergies  Allergen Reactions  . Aspirin

## 2012-01-20 NOTE — Telephone Encounter (Signed)
1 sample of dymista left up front for pt.  Left detailed message on named voice mail informing pt of this.  Will sign off.

## 2012-02-07 ENCOUNTER — Telehealth: Payer: Self-pay | Admitting: Gastroenterology

## 2012-02-07 NOTE — Telephone Encounter (Signed)
Patient is having intermittent abdominal pain and wants to know what the cause could be.  She is advised that she will need an office visit for evaluation.  She is advised to continue levsin for pain until her office visit on 9/16.  She is offered an earlier appt, but not able to come due to work and being out of town.

## 2012-03-01 ENCOUNTER — Telehealth: Payer: Self-pay | Admitting: Critical Care Medicine

## 2012-03-01 ENCOUNTER — Ambulatory Visit: Payer: Managed Care, Other (non HMO) | Admitting: Gastroenterology

## 2012-03-01 ENCOUNTER — Telehealth: Payer: Self-pay | Admitting: Gastroenterology

## 2012-03-01 NOTE — Telephone Encounter (Signed)
Call from patient this AM stating that she woke up with wheezing.  Used her ventiolin inhalder times one with no relief.  Due to her concern related to panic issues when she gets into respiratory she contacted the on call MD.  She states she is unsure as to whether the wheezing is related to her pulmonary or her GI problems as wheezing has been associated with both.  She did take a 10 mg prednisone this AM.  The patient can talk in complete sentences.  She does not sound short of breath nor can I hear wheezing over the phone.  The patient states she has soft wheezes.  She denies F/C/N/V.  She does report some mild SOB and dry cough.  She has yellow nasal discharge.    Plan: I have assured the patient that she does not sound critically ill.  I have recommended she use her inhaler one more time.  If she continues to have symptoms she is to contact the pulmonary office this AM as soon as they open.

## 2012-03-01 NOTE — Telephone Encounter (Signed)
She needs an office evaluation by her PCP or pulmonologist She can see an extender in our office after PCP or pulmonologist appt since I am not in the office again until the week of 9/30. At my last office visit I recommended that she take a Nexium 40 mg po bid and she should do this or another PPI bid if Nexium is not covered.

## 2012-03-01 NOTE — Telephone Encounter (Signed)
Patient advised She will come see Mike Gip PA on 03/03/12 10:00

## 2012-03-01 NOTE — Telephone Encounter (Signed)
Patient called to report that she has "tightness in her chest".  She has a history of asthma and her inhalers are not helping her symptoms.  She reports that her tightness is coming from her indigestion.  She had an appt for this am that she cancelled, she wants to know what else she can do for her chest tightness.  She reports that she has tried hyoscyamine and it is also not helping.  I have advised her she needs to speak with her pulmonologist as Reflux does not make you SOB. Patient instructed to maintain an anti-reflux diet. Advised to avoid caffeine, mint, citrus foods/juices, tomatoes,  chocolate, NSAIDS/ASA products.  Instructed not to eat within 2 hours of exercise or bed, multiple small meals are better than 3 large meals.  Need to take PPI 30 minutes prior to 1st meal of the day.   Dr. Russella Dar, she is insistent that her symptoms are not from asthma but are from reflux.  Please advise

## 2012-03-01 NOTE — Telephone Encounter (Signed)
Katie: FYI

## 2012-03-03 ENCOUNTER — Encounter: Payer: Self-pay | Admitting: Physician Assistant

## 2012-03-03 ENCOUNTER — Ambulatory Visit (INDEPENDENT_AMBULATORY_CARE_PROVIDER_SITE_OTHER): Payer: Managed Care, Other (non HMO) | Admitting: Physician Assistant

## 2012-03-03 ENCOUNTER — Other Ambulatory Visit (INDEPENDENT_AMBULATORY_CARE_PROVIDER_SITE_OTHER): Payer: Managed Care, Other (non HMO)

## 2012-03-03 VITALS — BP 112/70 | HR 73 | Ht 67.0 in | Wt 176.0 lb

## 2012-03-03 DIAGNOSIS — K219 Gastro-esophageal reflux disease without esophagitis: Secondary | ICD-10-CM

## 2012-03-03 DIAGNOSIS — R1013 Epigastric pain: Secondary | ICD-10-CM

## 2012-03-03 LAB — CBC WITH DIFFERENTIAL/PLATELET
Basophils Absolute: 0 10*3/uL (ref 0.0–0.1)
HCT: 39.3 % (ref 36.0–46.0)
Lymphocytes Relative: 38.6 % (ref 12.0–46.0)
Lymphs Abs: 2.5 10*3/uL (ref 0.7–4.0)
Monocytes Relative: 6.3 % (ref 3.0–12.0)
Platelets: 276 10*3/uL (ref 150.0–400.0)
RDW: 14.2 % (ref 11.5–14.6)

## 2012-03-03 MED ORDER — SUCRALFATE 1 GM/10ML PO SUSP: ORAL | Status: DC

## 2012-03-03 NOTE — Patient Instructions (Addendum)
We scheduled the Ultrasound for Monday 9-23 at 8 Am.  Arrive at 7:45 AM.  Have nothing to eat or drink past midnight. Please go to the basement level to have your labs drawn.  We have given brochures on refulx.  Continue the Prilosec twice daily. We did a prescription for Carafate to [facility], [address]

## 2012-03-06 ENCOUNTER — Encounter: Payer: Self-pay | Admitting: Physician Assistant

## 2012-03-06 ENCOUNTER — Other Ambulatory Visit (HOSPITAL_COMMUNITY): Payer: Managed Care, Other (non HMO)

## 2012-03-06 NOTE — Progress Notes (Signed)
Subjective:    Patient ID: [name] [name], female    DOB: 06-18-1950, 61 y.o.   MRN: [id number]  HPI [name] [name] is a 61 year old female known to [name] [name] with history of colon polyps and GERD. She last had colonoscopy in May of 2013 and at that time had 2 small polyps removed, both were tubular adenomas and also noted to have mild diverticulosis. Last EGD was done in March of 2010 she had a distal esophageal stricture noted which was not dilated as she was asymptomatic and a 4 cm sliding hiatal hernia. She comes in today with complaints of chest tightness and is concerned this is coming from acid reflux. She is maintained on omeprazole 40 mg by mouth twice daily on a chronic basis. She also takes low-dose prednisone for asthma she says recently she has had some "attacks" of chest tightness and epigastric discomfort that have been more frequent and more intense than when she's never had before. She said primarily this is been over the past 3 weeks. She has been using the hyoscyamine but is not sure this is helping. She says a few weeks ago she woke up with chest tightness and discomfort and says that her symptoms lasted for 3-4 hours she did not have any associated nausea or vomiting. She then had another episode this past weekend and says that she was uncomfortable all day. Is not having any radiation of pain to her back and primarily feels the discomfort in her subxiphoid and epigastric area some radiation up into her chest. She has not had any associated nausea or vomiting and does not feel that she's having asthma symptoms with this. She says occasionally she gets a little bit of wheezing but again does not feel that her asthma is flaring. She says she generally feels worse after eating has not had any diarrhea. She's not been on any new medications or any aspirin or NSAIDs    Review of Systems  Constitutional: Negative.   HENT: Negative.   Eyes: Negative.   Respiratory: Positive for wheezing.     Cardiovascular: Positive for chest pain.  Gastrointestinal: Positive for abdominal pain.  Genitourinary: Negative.   Musculoskeletal: Negative.   Neurological: Negative.   Hematological: Negative.   Psychiatric/Behavioral: Negative.    Outpatient Encounter Prescriptions as of 03/03/2012  Medication Sig Dispense Refill  . hyoscyamine (LEVSIN SL) 0.125 MG SL tablet Place 0.125 mg under the tongue every 4 (four) hours as needed. Take 2 tablets under the tongue every four hours as needed      . omeprazole (PRILOSEC) 40 MG capsule Take 1 capsule (40 mg total) by mouth 2 (two) times daily.  60 capsule  11  . predniSONE (DELTASONE) 10 MG tablet Take 1 tablet (10 mg total) by mouth daily.  30 tablet  5  . PREMARIN vaginal cream Once weekly      . VENTOLIN HFA 108 (90 BASE) MCG/ACT inhaler INHALE 2 PUFFS EVERY 4 HOURS AS NEEDED FOR WHEEZING  1 Inhaler  5  . sucralfate (CARAFATE) 1 GM/10ML suspension Take 1 gram between meals and at bedtime.  420 mL  1  . DISCONTD: omeprazole-sodium bicarbonate (ZEGERID) 40-1100 MG per capsule TAKE ONE CAPSULE BY MOUTH TWICE A DAY  60 capsule  0       Allergies  Allergen Reactions  . Aspirin    Patient Active Problem List  Diagnosis  . TRACHEOBRONCHITIS, ACUTE  . NASAL POLYP  . RHINOSINUSITIS, RECURRENT  . Allergic rhinitis due to pollen  .  Extrinsic asthma, unspecified  . ESOPHAGEAL STRICTURE  . GERD  . CONSTIPATION  . CHEST PAIN UNSPECIFIED  . ABDOMINAL PAIN, EPIGASTRIC  . PERSONAL HX COLONIC POLYPS   History   Social History  . Marital Status: Married    Spouse Name: N/A    Number of Children: 2  . Years of Education: N/A   Occupational History  . Art gallery manager   .     Social History Main Topics  . Smoking status: Former Smoker    Quit date: 06/14/1988  . Smokeless tobacco: Never Used  . Alcohol Use: No  . Drug Use: No  . Sexually Active: Not on file   Other Topics Concern  . Not on file   Social History Narrative  . No  narrative on file    Objective:   Physical Exam well-developed female in no acute distress, pleasant blood pressure 112/70 pulse 73 height 5 foot 7 weight 176. HEENT; nontraumatic normocephalic EOMI PERRLA sclera anicteric, Supple no JVD, Cardiovascular; regular rate and rhythm with S1-S2 no murmur or gallop, Pulmonary; clear bilaterally either no rhonchi or wheezes audible, Abdomen ;soft she has mild subxiphoid tenderness no guarding no rebound no palpable mass or hepatosplenomegaly bowel sounds are active, Rectal ;exam not done, Extremities; no clubbing cyanosis or edema skin warm and dry, Psych; mood and affect normal and appropriate.        Assessment & Plan:  #47 60 year old female with known chronic GERD on 3 times a day PPI therapy with complaints of subxiphoid and chest discomfort and what episodic and not associated with nausea vomiting or shortness of breath. Is not clear whether her symptoms are coming from acid reflux or perhaps gallbladder disease.  Plan; continue Prilosec 40 mg by mouth twice daily Patient has Carafate at home and she will start this 1 g 4 times daily between meals and at bedtime Have reviewed antireflux regimen which she has not been following including elevation of the head of the bed and n.p.o. for at least 2 hours before bedtime Schedule for upper abdominal ultrasound CBC with differential and CMET  today Followup with [name] [name] in 2-3 weeks

## 2012-03-06 NOTE — Progress Notes (Signed)
Agree with Ms. [name] [name]'s assessment and plan. Carl E. Gessner, MD, FACG   

## 2012-03-07 ENCOUNTER — Telehealth: Payer: Self-pay | Admitting: Gastroenterology

## 2012-03-07 NOTE — Telephone Encounter (Signed)
Patient cancelled her appt for the abdominal US because she had an accident and fell on her hand and twisted her ankle.  She does not want to reschedule the [place] until she can drive.  She is advised that she will need to reschedule the [place] to complete her workup. She reports that her symptoms haven't improved.    She will call me back when she is ready to reschedule.

## 2012-03-09 ENCOUNTER — Telehealth: Payer: Self-pay | Admitting: Gastroenterology

## 2012-03-09 ENCOUNTER — Telehealth: Payer: Self-pay | Admitting: Internal Medicine

## 2012-03-09 NOTE — Telephone Encounter (Signed)
Patient spoke with Dr. Rhea Belton this am. He did not feel that her symptoms are GI related and needs to have her asthma eva She replayed to him that she has tightness in her chest and her inhalers are not helping any longer.  It was recommended to the patient that she keep the [place] ordered for tomorrow and she needs to be evaluated by her primary care or her pulmonologist today for continued SOB not responding to her inhalers.

## 2012-03-09 NOTE — Telephone Encounter (Signed)
Spoke with pt. She states that for the past several days has been wheezing and having increased SOB. I advised will send msg to [facility] but she she states that she is already going to [facility] with her spouse and is on her way there now. I advised to call us back if needed.

## 2012-03-10 ENCOUNTER — Other Ambulatory Visit: Payer: Self-pay

## 2012-03-10 ENCOUNTER — Telehealth: Payer: Self-pay | Admitting: Gastroenterology

## 2012-03-10 ENCOUNTER — Ambulatory Visit (HOSPITAL_COMMUNITY)
Admission: RE | Admit: 2012-03-10 | Discharge: 2012-03-10 | Disposition: A | Discharge status: Home / Self Care | Payer: Managed Care, Other (non HMO) | Source: Ambulatory Visit | Attending: Physician Assistant | Admitting: Physician Assistant

## 2012-03-10 DIAGNOSIS — K8020 Calculus of gallbladder without cholecystitis without obstruction: Secondary | ICD-10-CM | POA: Insufficient documentation

## 2012-03-10 DIAGNOSIS — K219 Gastro-esophageal reflux disease without esophagitis: Secondary | ICD-10-CM

## 2012-03-10 DIAGNOSIS — R1013 Epigastric pain: Secondary | ICD-10-CM

## 2012-03-10 DIAGNOSIS — Q619 Cystic kidney disease, unspecified: Secondary | ICD-10-CM | POA: Insufficient documentation

## 2012-03-13 NOTE — Telephone Encounter (Signed)
See results on [place] for additional notes

## 2012-03-14 ENCOUNTER — Telehealth: Payer: Self-pay | Admitting: Critical Care Medicine

## 2012-03-14 HISTORY — PX: THUMB FUSION: SUR636

## 2012-03-14 MED ORDER — PREDNISONE 10 MG PO TABS: ORAL_TABLET | ORAL | Status: DC

## 2012-03-14 NOTE — Telephone Encounter (Signed)
PT called with asthma flare. Now lives in [place]   I called in prednisone 10mg  Take 4 for two days three for two days two for two days one for two days  She needs OV this week.

## 2012-03-15 ENCOUNTER — Encounter (INDEPENDENT_AMBULATORY_CARE_PROVIDER_SITE_OTHER): Payer: Self-pay | Admitting: General Surgery

## 2012-03-15 ENCOUNTER — Ambulatory Visit (INDEPENDENT_AMBULATORY_CARE_PROVIDER_SITE_OTHER): Payer: Managed Care, Other (non HMO) | Admitting: General Surgery

## 2012-03-15 VITALS — BP 108/72 | HR 76 | Temp 97.0°F | Resp 16 | Ht 68.0 in | Wt 172.4 lb

## 2012-03-15 DIAGNOSIS — K8020 Calculus of gallbladder without cholecystitis without obstruction: Secondary | ICD-10-CM

## 2012-03-15 NOTE — Telephone Encounter (Signed)
Friday 03-17-12 at 11:15am with CY.

## 2012-03-15 NOTE — Telephone Encounter (Signed)
[email or web address] on pt's home #

## 2012-03-15 NOTE — Progress Notes (Signed)
Patient ID: [name], female   DOB: 1951-05-24, 61 y.o.   MRN: [id number]  Chief Complaint  Patient presents with  . Pre-op Exam    eval GB    HPI [name] [name] is a 61 y.o. female.   HPI 61 yo AAF referred by Dr Russella Dar for evaluation of abdominal pain. The patient has a long-standing history of gastroesophageal reflux disease. She was also found to have a small esophageal stricture on upper endoscopy in 2010. She is on twice a day PPI therapy. She complains of a change in her chest and abdominal symptoms. She states recently she started having crampy epigastric discomfort that would last for about 4-5 hours. It was not associated with nausea or vomiting. The first episode she had a few weeks ago did not radiate. She states that she had another episode last night and it radiated to her right side. She states that normally it is difficult to tell the difference between her asthma and her reflux symptoms. She states that she will generally have chest tightness and wheezing. When she has had a real bad heartburn in the past she has drank milk which has ameliorated the discomfort. However on the past 2 occasions when she has drank milk it did not get any relief. She also tried taking her Levsin tablet and did not get any relief. She denies any diarrhea, constipation, melena, hematochezia, acholic stools, or NSAID use. She denies any fever or chills. She denies any emesis. She denies any regurgitation. She was recently told to increase her prednisone a few days ago because of concerns of a asthma flare. She has not scheduled an appointment to see her pulmonologist yet. She had a recent fall and broke her right thumb and is scheduled to undergo thumb surgery tomorrow.  Past Medical History  Diagnosis Date  . Polyp of nasal cavity   . Extrinsic asthma, unspecified   . Allergic rhinitis, cause unspecified     failed allergy vaccine  . Unspecified sinusitis (chronic)   . 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 S/P dilatation of esophageal stricture 08-2008  . Hiatal hernia   . Diverticulosis     Past Surgical History  Procedure Date  . Nasal polyp surgery     x 3   . Tonsillectomy   . Bunionectomy     left foot  . Colonoscopy w/ biopsies and polypectomy     Family History  Problem Relation Age of Onset  . Colon cancer Maternal Grandfather     unsure age of onset   . Cancer Maternal Grandfather     colon  . Stroke Maternal Aunt     Social History History  Substance Use Topics  . Smoking status: Former Smoker    Quit date: 06/14/1988  . Smokeless tobacco: Never Used  . Alcohol Use: No    Allergies  Allergen Reactions  . Aspirin     Current Outpatient Prescriptions  Medication Sig Dispense Refill  . azithromycin (ZITHROMAX) 250 MG tablet       . hyoscyamine (LEVSIN SL) 0.125 MG SL tablet Place 0.125 mg under the tongue every 4 (four) hours as needed. Take 2 tablets under the tongue every four hours as needed      . omeprazole (PRILOSEC) 40 MG capsule Take 1 capsule (40 mg total) by mouth 2 (two) times daily.  60 capsule  11  . predniSONE (DELTASONE) 10 MG tablet Take 4 for two days three for two days  two for two days one for two days  20 tablet  0  . PREMARIN vaginal cream Once weekly      . VENTOLIN HFA 108 (90 BASE) MCG/ACT inhaler INHALE 2 PUFFS EVERY 4 HOURS AS NEEDED FOR WHEEZING  1 Inhaler  5    Review of Systems Review of Systems  Constitutional: Negative for fever, activity change, appetite change and unexpected weight change.  HENT: Negative for hearing loss, nosebleeds, congestion and neck pain.   Eyes: Negative for photophobia and visual disturbance.  Respiratory: Positive for chest tightness and wheezing. Negative for cough, choking, shortness of breath and stridor.        Has "chest tightness and wheezing"; takes prednisone and inhaler  Cardiovascular: Negative for chest pain, palpitations and leg swelling.       Denies SOB,  orthopnea, PND, DOE;   Gastrointestinal:       See hpi  Genitourinary: Negative for dysuria, hematuria and difficulty urinating.  Musculoskeletal: Negative for joint swelling.       Recent fall with fractured Rt thumb - having thumb surgery tomorrow  Neurological: Negative for tremors, seizures, syncope, facial asymmetry and headaches.       Denies TIA, amaurosis fugax  Hematological: Negative for adenopathy. Does not bruise/bleed easily.  Psychiatric/Behavioral: Negative for confusion, disturbed wake/sleep cycle and self-injury.    Blood pressure 108/72, pulse 76, temperature 97 F (36.1 C), temperature source Temporal, resp. rate 16, height 5\' 8"  (1.727 m), weight 172 lb 6.4 oz (78.2 kg).  Physical Exam Physical Exam  Vitals reviewed. Constitutional: She is oriented to person, place, and time. She appears well-developed and well-nourished.       overweight  HENT:  Head: Normocephalic and atraumatic.  Right Ear: External ear normal.  Left Ear: External ear normal.  Eyes: Conjunctivae normal are normal. No scleral icterus.  Neck: Neck supple. No tracheal deviation present. No thyromegaly present.  Cardiovascular: Normal rate, regular rhythm and normal heart sounds.   Pulmonary/Chest: Effort normal. No respiratory distress. She has wheezes (very mild b/l exp wheeze). She has no rales. She exhibits no tenderness.  Abdominal: Soft. Bowel sounds are normal. She exhibits no distension. There is no tenderness. There is no rebound and no guarding.    Musculoskeletal: Normal range of motion. She exhibits no edema and no tenderness.       RUE in sling and cast  Lymphadenopathy:    She has no cervical adenopathy.  Neurological: She is alert and oriented to person, place, and time. She exhibits normal muscle tone.  Skin: Skin is warm and dry. No rash noted. No erythema.  Psychiatric: She has a normal mood and affect. Her behavior is normal. Judgment and thought content normal.    Data  Reviewed Amy Esterwood's note 03/03/12 Telephone encounter notes from the past 2 weeks Dr Roxy Cedar note from 11/2011 EGD report from 2010  COMPLETE ABDOMINAL ULTRASOUND 02/2012 Comparison: None.  Findings:  Gallbladder: 9 mm stone lodged in the region of the gallbladder  neck. No wall thickening. Negative sonographic Murphy's.  Common bile duct: Normal caliber, 3 mm.  Liver: 7 mm cyst within the right lobe appears simple. No other  focal abnormality. No biliary ductal dilatation.  IVC: Appears normal.  Pancreas: No focal abnormality seen.  Spleen: Within normal limits in size and echotexture.  Right Kidney: Normal in size and parenchymal echogenicity. No  evidence of mass or hydronephrosis.  Left Kidney: 1.4 cm cyst in the mid pole. No hydronephrosis.  Normal echotexture.  Abdominal aorta: No aneurysm identified.   IMPRESSION:  Cholelithiasis. 9 mm stone appears lodged in the gallbladder neck.  No current sonographic changes of acute cholecystitis.   Assessment    Symptomatic cholelithiasis GERD Asthma    Plan    I believe some of the patient's symptoms are consistent with gallbladder disease.  We discussed gallbladder disease. The patient was given Agricultural engineer. We discussed non-operative and operative management. We discussed the signs & symptoms of acute cholecystitis  I discussed laparoscopic cholecystectomy with IOC in detail.  The patient was given educational material as well as diagrams detailing the procedure.  We discussed the risks and benefits of a laparoscopic cholecystectomy including, but not limited to bleeding, infection, injury to surrounding structures such as the intestine or liver, bile leak, retained gallstones, need to convert to an open procedure, prolonged diarrhea, blood clots such as  DVT, common bile duct injury, anesthesia risks, and possible need for additional procedures.  We discussed the typical post-operative recovery course. I explained  that the likelihood of improvement of their symptoms is fair- good.  She has elected to proceed with surgery; however, I recommended that she go thru with her thumb surgery first and recover for a few days before scheduling her gallbladder surgery. She will call the office next week to schedule surgery in the next month or so. In the interim, I highly encouraged the patient to make an appointment with her pulmonologist to follow-up on her asthma  Mary Sella. Andrey Campanile, MD, FACS General, Bariatric, & Minimally Invasive Surgery [facility], [place]        [facility] M 03/15/2012, 12:32 PM

## 2012-03-15 NOTE — Telephone Encounter (Signed)
Called, spoke with pt.  She cannot come in on Friday as she will be having hand surgery on this day. Ok per [place] for pt to see TP.  Pt scheduled to see TP tomorrow, OCt 3 at 11:45 am -- pt aware.

## 2012-03-15 NOTE — Telephone Encounter (Signed)
Dr. Maple Hudson pt -- last seen 11/16/11 by CDY.    Katie/Dr. Maple Hudson, pls advise if pt can be worked in this week or if ok for her to see another provider.  Thank you.

## 2012-03-15 NOTE — Patient Instructions (Signed)
Call our office to schedule surgery once you have determined your follow-up for your thumb. [phone] Call and make an appointment with your lung doctor

## 2012-03-15 NOTE — Telephone Encounter (Signed)
Returning call to the nurse please call [phone] ext [phone]

## 2012-03-16 ENCOUNTER — Ambulatory Visit (INDEPENDENT_AMBULATORY_CARE_PROVIDER_SITE_OTHER): Payer: Managed Care, Other (non HMO) | Admitting: Adult Health

## 2012-03-16 ENCOUNTER — Encounter: Payer: Self-pay | Admitting: Adult Health

## 2012-03-16 VITALS — BP 122/88 | HR 76 | Temp 96.9°F | Ht 69.0 in | Wt 172.8 lb

## 2012-03-16 DIAGNOSIS — J45909 Unspecified asthma, uncomplicated: Secondary | ICD-10-CM

## 2012-03-16 NOTE — Patient Instructions (Addendum)
Finish Prednisone taper.  Saline nasal rinses As needed    Zyrtec 10mg  At bedtime  X 5 days  Please contact office for sooner follow up if symptoms do not improve or worsen or seek emergency care  follow up Dr. Maple Hudson  As planned and As needed

## 2012-03-16 NOTE — Telephone Encounter (Signed)
Received error msg from [facility] stating Prednisone Taper did not go through.  I called [facility], spoke with Trevor Mace -- was advised Pred taper was received on 10/1 and filled by pt.  Nothing further needed.

## 2012-03-16 NOTE — Progress Notes (Signed)
Subjective:    Patient ID: [name] [name], female    DOB: 01/15/1951, 61 y.o.   MRN: [id number]  HPI 10/14/10- 12 yoF with asthma, allergic rhinitis.  Last here August 31, 2010- Because of problems with GERD and yeast we were hoping to reduce prednisone which she has been on for much of the time since the 1970's. She has maintained mostly on 5 mg daily. We gave 1 mg tabs, but she didn't feel that 4 mg held her.  Heart burn is better and congestion a bit worse. Pollen season usually gets her some congested. She had been on Xolair x 3 years, then wheezed worse and it was stopped.  Today she feels ok, but changes day-to-day. Main trigger for her she thinks is temperature change/ weather. [place] trip - wheezed in [place].   11/13/10- Asthma, allergic rhinitis/ recurrent sinusitis Called last week and we had her take prednisone 10 mg daily x 7 days then go back to 5 mg daily. She says 5 mg isn't enough. Post nasal drip is getting in her lungs and requiring use of rescue inhaler several times daily. Denies dyspnea or headache. Occasional throat clearing of yellow mucus. Little sustained wheeze, just feels tight.  01/11/11- 59 yoF former smoker, followed for asthma, allergic rhinitis/ hx nasal polyps/ aspirin allergy, recurrent sinusitis complicated by GERD Maintenance prednisone was on 5 mg daily, then went up to 40 and now back to 20/day.  Feels fine now, but last week throat tickle progressed into wheeze and she called [place]. [place] home, [place], basement, carpet. Says there was mold , but she can't smell so she can't tell. Carpet was replaced and visible leak fixed. Also aware of reflux- diet controlled and continues Zegerid.   04/13/11- 59 yoF former smoker, followed for asthma, allergic rhinitis/ hx nasal polyps/ aspirin allergy, recurrent sinusitis complicated by GERD And she declines flu vaccine. She tried Xolair injections for 3 years with no benefit. At last visit we tried tapering  prednisone to maintenance 5 mg daily. She developed increasing nasal congestion and postnasal drainage so she went back up to 10 mg of prednisone daily. With that she feels better but she says that is not enough prednisone to restore her sense of smell. Using rescue inhaler about one time daily for tickle and chest tightness. Daily Water Pik for her nose returns some yellow material. She does not feel that she has a sinus infection. She has not had recent followup with Dr. Erik Obey ENT and was to see him again as needed. He had told her her nasal problems were allergy  .+nasal congestion, post nasal drip,  11/16/11-  36 yoF former smoker, followed for asthma, allergic rhinitis/ hx nasal polyps/ aspirin allergy, recurrent sinusitis complicated by GERD She is past the spring seasonal rhinitis time for her. Had a skin rash diagnosed as "allergic" at an urgent care they increase prednisone and gave cortisone cream but she is now back to her maintenance prednisone 10 mg daily. The rash is better and she feels fairly stable. Denies wheeze or cough. Still no sense of smell even on a prednisone 60 mg taper.  03/16/2012 Acute OV  Presents for an acute office visit.  Called in 10/1 with 3 weeks of cough /wheezing, prednisone taper sent  She returns today feeling some better but still has some drainage w/ tickle in throat and throat clearing often.  Went to UC initially  w/ depo x2, pred pak and zpak.  Denies cough, SOB, tightness in chest. Or edema.  Currently healing from Rigth thumb fx in cast  Nasal sprays not helping.    ROS-see HPI Constitutional:   No  weight loss, night sweats,  Fevers, chills, fatigue, or  lassitude.  HEENT:   No headaches,  Difficulty swallowing,  Tooth/dental problems, or  Sore throat,                No sneezing, itching, ear ache,  +nasal congestion, post nasal drip,   CV:  No chest pain,  Orthopnea, PND, swelling in lower extremities, anasarca, dizziness, palpitations, syncope.     GI  No heartburn, indigestion, abdominal pain, nausea, vomiting, diarrhea, change in bowel habits, loss of appetite, bloody stools.   Resp:    No coughing up of blood.  No change in color of mucus.   No chest wall deformity  Skin: no rash or lesions.  GU: no dysuria, change in color of urine, no urgency or frequency.  No flank pain, no hematuria   MS:    No back pain.  Psych:  No change in mood or affect. No depression or anxiety.  No memory loss.       Objective:   Physical Exam GEN: A/Ox3; pleasant , NAD  HEENT:  [place]/AT,  EACs-clear, TMs-wnl, NOSE-clear drainage , THROAT-clear, no lesions, no postnasal drip or exudate noted.   NECK:  Supple w/ fair ROM; no JVD; normal carotid impulses w/o bruits; no thyromegaly or nodules palpated; no lymphadenopathy.  RESP  Coarse BS  w/o, wheezes/ rales/ or rhonchi.no accessory muscle use, no dullness to percussion  CARD:  RRR, no m/r/g  , no peripheral edema, pulses intact, no cyanosis or clubbing.  GI:   Soft & nt; nml bowel sounds; no organomegaly or masses detected.  Musco: Warm bil, no deformities or joint swelling noted.   Neuro: alert, no focal deficits noted.    Skin: Warm, no lesions or rashes

## 2012-03-17 ENCOUNTER — Ambulatory Visit (INDEPENDENT_AMBULATORY_CARE_PROVIDER_SITE_OTHER): Payer: Self-pay | Admitting: General Surgery

## 2012-03-17 NOTE — Assessment & Plan Note (Signed)
Mild flare with rhinitis   Plan finish Prednisone taper.  Saline nasal rinses As needed    Zyrtec 10mg  At bedtime  X 5 days  Please contact office for sooner follow up if symptoms do not improve or worsen or seek emergency care  follow up Dr. Maple Hudson  As planned and As needed

## 2012-03-27 ENCOUNTER — Ambulatory Visit: Payer: Managed Care, Other (non HMO) | Admitting: Gastroenterology

## 2012-04-11 ENCOUNTER — Telehealth (INDEPENDENT_AMBULATORY_CARE_PROVIDER_SITE_OTHER): Payer: Self-pay | Admitting: General Surgery

## 2012-04-11 NOTE — Telephone Encounter (Signed)
Message copied by Liliana Cline on Tue Apr 11, 2012 12:33 PM ------      Message from: Erin Sons      Created: Tue Apr 11, 2012 11:47 AM      Regarding: Dr Lanier Prude: [phone]       Ext [phone].Pt need to talk to you concerning surgery.

## 2012-04-11 NOTE — Telephone Encounter (Signed)
Left message on machine for patient to call back and ask for me. Also made her aware if this is about setting up surgery to ask for surgery scheduling when she calls back.

## 2012-04-12 NOTE — Telephone Encounter (Signed)
Patient wanted to know if okay to schedule once she is done with her thumb surgery even if she still has a cast. I told her that should be okay. Patient transferred to scheduling.

## 2012-04-13 ENCOUNTER — Other Ambulatory Visit (INDEPENDENT_AMBULATORY_CARE_PROVIDER_SITE_OTHER): Payer: Self-pay | Admitting: General Surgery

## 2012-04-13 ENCOUNTER — Telehealth (INDEPENDENT_AMBULATORY_CARE_PROVIDER_SITE_OTHER): Payer: Self-pay | Admitting: General Surgery

## 2012-04-13 NOTE — Telephone Encounter (Signed)
Message copied by Liliana Cline on Thu Apr 13, 2012 12:16 PM ------      Message from: Zacarias Pontes      Created: Thu Apr 13, 2012 11:54 AM       Pt has some questions for you ,can you please call her back after 1pm today at [phone] ext [phone]

## 2012-04-13 NOTE — Telephone Encounter (Signed)
Spoke with patient. Wanted to clarify out of work time. I advised 1-2 weeks depending on how she feels. She also wanted to know about staying in the hospital overnight. I made her aware as long as any pain or nausea is controlled by oral medications she should be able to go home the day of surgery. She will call with any other questions.

## 2012-04-25 ENCOUNTER — Encounter (HOSPITAL_COMMUNITY): Payer: Self-pay | Admitting: Pharmacy Technician

## 2012-04-27 ENCOUNTER — Other Ambulatory Visit (HOSPITAL_COMMUNITY): Payer: Managed Care, Other (non HMO)

## 2012-04-28 NOTE — Pre-Procedure Instructions (Addendum)
20 JEANET TOAL  04/28/2012   Your procedure is scheduled on:  Friday, November 22nd  Report to [facility] at 1030 AM.  Call this number if you have problems the morning of surgery: [phone]   Remember:   Do not eat food or drink:After Midnight.   Take these medicines the morning of surgery with A SIP OF WATER: albuterol if needed, prilosec,prednisone   Do not wear jewelry, make-up or nail polish.  Do not wear lotions, powders, or perfumes.   Do not shave 48 hours prior to surgery.   Do not bring valuables to the hospital.  Contacts, dentures or bridgework may not be worn into surgery.  Leave suitcase in the car. After surgery it may be brought to your room.  For patients admitted to the hospital, checkout time is 11:00 AM the day of discharge.              Spouse Chrissie Noa [phone]  Patients discharged the day of surgery will not be allowed to drive home.    Special Instructions: Shower using CHG 2 nights before surgery and the night before surgery.  If you shower the day of surgery use CHG.  Use special wash - you have one bottle of CHG for all showers.  You should use approximately 1/3 of the bottle for each shower.   Please read over the following fact sheets that you were given: Pain Booklet, Coughing and Deep Breathing, MRSA Information and Surgical Site Infection Prevention

## 2012-05-01 ENCOUNTER — Encounter (HOSPITAL_COMMUNITY): Payer: Self-pay

## 2012-05-01 ENCOUNTER — Encounter (HOSPITAL_COMMUNITY)
Admission: RE | Admit: 2012-05-01 | Discharge: 2012-05-01 | Disposition: A | Discharge status: Home / Self Care | Payer: Managed Care, Other (non HMO) | Source: Ambulatory Visit | Attending: General Surgery | Admitting: General Surgery

## 2012-05-01 HISTORY — DX: Gastro-esophageal reflux disease without esophagitis: K21.9

## 2012-05-01 HISTORY — DX: Anxiety disorder, unspecified: F41.9

## 2012-05-01 HISTORY — DX: Nausea with vomiting, unspecified: Z98.890

## 2012-05-01 HISTORY — DX: Nausea with vomiting, unspecified: R11.2

## 2012-05-01 HISTORY — DX: Unspecified osteoarthritis, unspecified site: M19.90

## 2012-05-01 LAB — COMPREHENSIVE METABOLIC PANEL
AST: 17 U/L (ref 0–37)
BUN: 16 mg/dL (ref 6–23)
CO2: 29 mEq/L (ref 19–32)
Calcium: 9.2 mg/dL (ref 8.4–10.5)
Chloride: 104 mEq/L (ref 96–112)
Creatinine, Ser: 0.92 mg/dL (ref 0.50–1.10)
GFR calc Af Amer: 76 mL/min — ABNORMAL LOW (ref >90)
GFR calc non Af Amer: 66 mL/min — ABNORMAL LOW (ref >90)
Total Bilirubin: 0.2 mg/dL — ABNORMAL LOW (ref 0.3–1.2)

## 2012-05-01 LAB — CBC WITH DIFFERENTIAL/PLATELET
Basophils Absolute: 0 10*3/uL (ref 0.0–0.1)
Basophils Relative: 1 % (ref 0–1)
Eosinophils Relative: 3 % (ref 0–5)
HCT: 36.5 % (ref 36.0–46.0)
Hemoglobin: 12.1 g/dL (ref 12.0–15.0)
Lymphocytes Relative: 29 % (ref 12–46)
MCHC: 33.2 g/dL (ref 30.0–36.0)
MCV: 83.5 fL (ref 78.0–100.0)
Monocytes Absolute: 0.4 10*3/uL (ref 0.1–1.0)
Monocytes Relative: 5 % (ref 3–12)
Neutro Abs: 5 10*3/uL (ref 1.7–7.7)
RDW: 13.9 % (ref 11.5–15.5)

## 2012-05-04 MED ORDER — DEXTROSE 5 % IV SOLN
2.0000 g | INTRAVENOUS | Status: AC
  Administered 2012-05-05: 2 g via INTRAVENOUS
  Filled 2012-05-04: qty 2

## 2012-05-05 ENCOUNTER — Ambulatory Visit (HOSPITAL_COMMUNITY): Payer: Managed Care, Other (non HMO)

## 2012-05-05 ENCOUNTER — Encounter (HOSPITAL_COMMUNITY): Payer: Self-pay | Admitting: Anesthesiology

## 2012-05-05 ENCOUNTER — Ambulatory Visit (HOSPITAL_COMMUNITY)
Admission: RE | Admit: 2012-05-05 | Discharge: 2012-05-05 | Disposition: A | Discharge status: Home / Self Care | Payer: Managed Care, Other (non HMO) | Source: Ambulatory Visit | Attending: General Surgery | Admitting: General Surgery

## 2012-05-05 ENCOUNTER — Ambulatory Visit (HOSPITAL_COMMUNITY): Payer: Managed Care, Other (non HMO) | Admitting: Anesthesiology

## 2012-05-05 ENCOUNTER — Encounter (HOSPITAL_COMMUNITY)
Admission: RE | Disposition: A | Discharge status: Home / Self Care | Payer: Self-pay | Source: Ambulatory Visit | Attending: General Surgery

## 2012-05-05 DIAGNOSIS — M129 Arthropathy, unspecified: Secondary | ICD-10-CM | POA: Insufficient documentation

## 2012-05-05 DIAGNOSIS — K219 Gastro-esophageal reflux disease without esophagitis: Secondary | ICD-10-CM | POA: Insufficient documentation

## 2012-05-05 DIAGNOSIS — Z79899 Other long term (current) drug therapy: Secondary | ICD-10-CM | POA: Insufficient documentation

## 2012-05-05 DIAGNOSIS — K8010 Calculus of gallbladder with chronic cholecystitis without obstruction: Secondary | ICD-10-CM

## 2012-05-05 DIAGNOSIS — Z01810 Encounter for preprocedural cardiovascular examination: Secondary | ICD-10-CM | POA: Insufficient documentation

## 2012-05-05 DIAGNOSIS — Z01818 Encounter for other preprocedural examination: Secondary | ICD-10-CM | POA: Insufficient documentation

## 2012-05-05 DIAGNOSIS — J45909 Unspecified asthma, uncomplicated: Secondary | ICD-10-CM | POA: Insufficient documentation

## 2012-05-05 DIAGNOSIS — Z01812 Encounter for preprocedural laboratory examination: Secondary | ICD-10-CM | POA: Insufficient documentation

## 2012-05-05 DIAGNOSIS — IMO0002 Reserved for concepts with insufficient information to code with codable children: Secondary | ICD-10-CM | POA: Insufficient documentation

## 2012-05-05 DIAGNOSIS — F410 Panic disorder [episodic paroxysmal anxiety] without agoraphobia: Secondary | ICD-10-CM | POA: Insufficient documentation

## 2012-05-05 DIAGNOSIS — K449 Diaphragmatic hernia without obstruction or gangrene: Secondary | ICD-10-CM | POA: Insufficient documentation

## 2012-05-05 DIAGNOSIS — K5730 Diverticulosis of large intestine without perforation or abscess without bleeding: Secondary | ICD-10-CM | POA: Insufficient documentation

## 2012-05-05 HISTORY — PX: CHOLECYSTECTOMY: SHX55

## 2012-05-05 SURGERY — LAPAROSCOPIC CHOLECYSTECTOMY WITH INTRAOPERATIVE CHOLANGIOGRAM
Anesthesia: General | Site: Abdomen | Wound class: Contaminated

## 2012-05-05 MED ORDER — LIDOCAINE HCL (CARDIAC) 20 MG/ML IV SOLN
INTRAVENOUS | Status: DC | PRN
  Administered 2012-05-05: 90 mg via INTRAVENOUS

## 2012-05-05 MED ORDER — MIDAZOLAM HCL 5 MG/5ML IJ SOLN
INTRAMUSCULAR | Status: DC | PRN
  Administered 2012-05-05 (×2): 1 mg via INTRAVENOUS

## 2012-05-05 MED ORDER — ONDANSETRON HCL 4 MG/2ML IJ SOLN
INTRAMUSCULAR | Status: AC
  Filled 2012-05-05: qty 2

## 2012-05-05 MED ORDER — HYDROMORPHONE HCL PF 1 MG/ML IJ SOLN
0.2500 mg | INTRAMUSCULAR | Status: DC | PRN
  Administered 2012-05-05 (×2): 0.5 mg via INTRAVENOUS

## 2012-05-05 MED ORDER — DEXAMETHASONE SODIUM PHOSPHATE 4 MG/ML IJ SOLN
INTRAMUSCULAR | Status: DC | PRN
  Administered 2012-05-05: 4 mg via INTRAVENOUS

## 2012-05-05 MED ORDER — FENTANYL CITRATE 0.05 MG/ML IJ SOLN
INTRAMUSCULAR | Status: DC | PRN
  Administered 2012-05-05 (×2): 50 ug via INTRAVENOUS
  Administered 2012-05-05: 100 ug via INTRAVENOUS
  Administered 2012-05-05: 50 ug via INTRAVENOUS

## 2012-05-05 MED ORDER — NEOSTIGMINE METHYLSULFATE 1 MG/ML IJ SOLN
INTRAMUSCULAR | Status: DC | PRN
  Administered 2012-05-05: 4 mg via INTRAVENOUS

## 2012-05-05 MED ORDER — ARTIFICIAL TEARS OP OINT
TOPICAL_OINTMENT | OPHTHALMIC | Status: DC | PRN
  Administered 2012-05-05: 1 via OPHTHALMIC

## 2012-05-05 MED ORDER — ROCURONIUM BROMIDE 100 MG/10ML IV SOLN
INTRAVENOUS | Status: DC | PRN
  Administered 2012-05-05: 50 mg via INTRAVENOUS

## 2012-05-05 MED ORDER — BUPIVACAINE-EPINEPHRINE PF 0.25-1:200000 % IJ SOLN
INTRAMUSCULAR | Status: AC
  Filled 2012-05-05: qty 30

## 2012-05-05 MED ORDER — ONDANSETRON HCL 4 MG/2ML IJ SOLN: 4.0000 mg | Freq: Once | INTRAMUSCULAR | Status: DC | PRN

## 2012-05-05 MED ORDER — ONDANSETRON HCL 4 MG/2ML IJ SOLN
INTRAMUSCULAR | Status: DC | PRN
  Administered 2012-05-05: 4 mg via INTRAVENOUS

## 2012-05-05 MED ORDER — BUPIVACAINE-EPINEPHRINE 0.25% -1:200000 IJ SOLN
INTRAMUSCULAR | Status: DC | PRN
  Administered 2012-05-05: 30 mL

## 2012-05-05 MED ORDER — PROPOFOL 10 MG/ML IV BOLUS
INTRAVENOUS | Status: DC | PRN
  Administered 2012-05-05: 170 mg via INTRAVENOUS

## 2012-05-05 MED ORDER — LACTATED RINGERS IV SOLN
INTRAVENOUS | Status: DC
  Administered 2012-05-05: 12:00:00 via INTRAVENOUS

## 2012-05-05 MED ORDER — OXYCODONE-ACETAMINOPHEN 5-325 MG PO TABS: 1.0000 | ORAL_TABLET | ORAL | Status: DC | PRN

## 2012-05-05 MED ORDER — HYDROMORPHONE HCL PF 1 MG/ML IJ SOLN
INTRAMUSCULAR | Status: AC
  Administered 2012-05-05: 0.5 mg via INTRAVENOUS
  Filled 2012-05-05: qty 1

## 2012-05-05 MED ORDER — HYDROMORPHONE HCL PF 1 MG/ML IJ SOLN
INTRAMUSCULAR | Status: AC
  Filled 2012-05-05: qty 1

## 2012-05-05 MED ORDER — EPHEDRINE SULFATE 50 MG/ML IJ SOLN
INTRAMUSCULAR | Status: DC | PRN
  Administered 2012-05-05: 10 mg via INTRAVENOUS

## 2012-05-05 MED ORDER — SODIUM CHLORIDE 0.9 % IV SOLN
INTRAVENOUS | Status: DC | PRN
  Administered 2012-05-05: 13:00:00

## 2012-05-05 MED ORDER — GLYCOPYRROLATE 0.2 MG/ML IJ SOLN
INTRAMUSCULAR | Status: DC | PRN
  Administered 2012-05-05: 0.6 mg via INTRAVENOUS

## 2012-05-05 MED ORDER — HEMOSTATIC AGENTS (NO CHARGE) OPTIME
TOPICAL | Status: DC | PRN
  Administered 2012-05-05: 1

## 2012-05-05 MED ORDER — PHENYLEPHRINE HCL 10 MG/ML IJ SOLN
INTRAMUSCULAR | Status: DC | PRN
  Administered 2012-05-05 (×2): 80 ug via INTRAVENOUS

## 2012-05-05 MED ORDER — SODIUM CHLORIDE 0.9 % IR SOLN
Status: DC | PRN
  Administered 2012-05-05: 1000 mL

## 2012-05-05 MED ORDER — LACTATED RINGERS IV SOLN
INTRAVENOUS | Status: DC | PRN
  Administered 2012-05-05: 12:00:00 via INTRAVENOUS

## 2012-05-05 SURGICAL SUPPLY — 45 items
APPLIER CLIP 5 13 M/L LIGAMAX5 (MISCELLANEOUS) ×2
BANDAGE ADHESIVE 1X3 (GAUZE/BANDAGES/DRESSINGS) ×6 IMPLANT
BENZOIN TINCTURE PRP APPL 2/3 (GAUZE/BANDAGES/DRESSINGS) ×2 IMPLANT
BLADE SURG ROTATE 9660 (MISCELLANEOUS) IMPLANT
CANISTER SUCTION 2500CC (MISCELLANEOUS) ×2 IMPLANT
CHLORAPREP W/TINT 26ML (MISCELLANEOUS) ×2 IMPLANT
CLIP APPLIE 5 13 M/L LIGAMAX5 (MISCELLANEOUS) ×1 IMPLANT
CLOTH BEACON ORANGE TIMEOUT ST (SAFETY) ×2 IMPLANT
COVER MAYO STAND STRL (DRAPES) ×2 IMPLANT
COVER SURGICAL LIGHT HANDLE (MISCELLANEOUS) ×2 IMPLANT
DECANTER SPIKE VIAL GLASS SM (MISCELLANEOUS) ×2 IMPLANT
DRAPE C-ARM 42X72 X-RAY (DRAPES) ×2 IMPLANT
DRAPE UTILITY 15X26 W/TAPE STR (DRAPE) ×4 IMPLANT
DRSG TEGADERM 4X4.75 (GAUZE/BANDAGES/DRESSINGS) ×2 IMPLANT
ELECT REM PT RETURN 9FT ADLT (ELECTROSURGICAL) ×2
ELECTRODE REM PT RTRN 9FT ADLT (ELECTROSURGICAL) ×1 IMPLANT
GAUZE SPONGE 2X2 8PLY STRL LF (GAUZE/BANDAGES/DRESSINGS) ×1 IMPLANT
GLOVE BIO SURGEON STRL SZ7 (GLOVE) ×2 IMPLANT
GLOVE BIOGEL M STRL SZ7.5 (GLOVE) ×2 IMPLANT
GLOVE BIOGEL PI IND STRL 7.0 (GLOVE) ×3 IMPLANT
GLOVE BIOGEL PI IND STRL 8 (GLOVE) ×1 IMPLANT
GLOVE BIOGEL PI INDICATOR 7.0 (GLOVE) ×3
GLOVE BIOGEL PI INDICATOR 8 (GLOVE) ×1
GLOVE SURG SS PI 6.5 STRL IVOR (GLOVE) ×2 IMPLANT
GOWN PREVENTION PLUS XXLARGE (GOWN DISPOSABLE) ×2 IMPLANT
GOWN STRL NON-REIN LRG LVL3 (GOWN DISPOSABLE) ×4 IMPLANT
HEMOSTAT SNOW SURGICEL 2X4 (HEMOSTASIS) ×2 IMPLANT
KIT BASIN OR (CUSTOM PROCEDURE TRAY) ×2 IMPLANT
KIT ROOM TURNOVER OR (KITS) ×2 IMPLANT
NS IRRIG 1000ML POUR BTL (IV SOLUTION) ×2 IMPLANT
PAD ARMBOARD 7.5X6 YLW CONV (MISCELLANEOUS) ×2 IMPLANT
POUCH SPECIMEN RETRIEVAL 10MM (ENDOMECHANICALS) ×2 IMPLANT
SCISSORS LAP 5X35 DISP (ENDOMECHANICALS) ×2 IMPLANT
SET CHOLANGIOGRAPH 5 50 .035 (SET/KITS/TRAYS/PACK) ×2 IMPLANT
SET IRRIG TUBING LAPAROSCOPIC (IRRIGATION / IRRIGATOR) ×2 IMPLANT
SLEEVE ENDOPATH XCEL 5M (ENDOMECHANICALS) ×4 IMPLANT
SPECIMEN JAR SMALL (MISCELLANEOUS) ×2 IMPLANT
SPONGE GAUZE 2X2 STER 10/PKG (GAUZE/BANDAGES/DRESSINGS) ×1
STRIP CLOSURE SKIN 1/2X4 (GAUZE/BANDAGES/DRESSINGS) ×2 IMPLANT
SUT MNCRL AB 4-0 PS2 18 (SUTURE) ×2 IMPLANT
TOWEL OR 17X24 6PK STRL BLUE (TOWEL DISPOSABLE) ×2 IMPLANT
TOWEL OR 17X26 10 PK STRL BLUE (TOWEL DISPOSABLE) ×2 IMPLANT
TRAY LAPAROSCOPIC (CUSTOM PROCEDURE TRAY) ×2 IMPLANT
TROCAR XCEL BLUNT TIP 100MML (ENDOMECHANICALS) ×2 IMPLANT
TROCAR XCEL NON-BLD 5MMX100MML (ENDOMECHANICALS) ×2 IMPLANT

## 2012-05-05 NOTE — Anesthesia Preprocedure Evaluation (Addendum)
Anesthesia Evaluation  Patient identified by MRN, date of birth, ID band Patient awake    History of Anesthesia Complications (+) PONV  Airway Mallampati: II  Neck ROM: Full    Dental  (+) Teeth Intact   Pulmonary asthma ,  breath sounds clear to auscultation        Cardiovascular negative cardio ROS  Rhythm:Regular Rate:Normal     Neuro/Psych    GI/Hepatic Neg liver ROS, hiatal hernia, GERD-  ,  Endo/Other  negative endocrine ROS  Renal/GU negative Renal ROS     Musculoskeletal negative musculoskeletal ROS (+)   Abdominal   Peds  Hematology negative hematology ROS (+)   Anesthesia Other Findings   Reproductive/Obstetrics                           Anesthesia Physical Anesthesia Plan  ASA: II  Anesthesia Plan: General   Post-op Pain Management:    Induction: Intravenous  Airway Management Planned: Oral ETT  Additional Equipment:   Intra-op Plan:   Post-operative Plan: Extubation in OR  Informed Consent:   Dental advisory given  Plan Discussed with: CRNA and Surgeon  Anesthesia Plan Comments:         Anesthesia Quick Evaluation

## 2012-05-05 NOTE — Op Note (Signed)
Laparoscopic Cholecystectomy with IOC Procedure Note  Indications: This patient presents with symptomatic gallbladder disease and will undergo laparoscopic cholecystectomy.  Pre-operative Diagnosis: Calculus of gallbladder with other cholecystitis, without mention of obstruction  Post-operative Diagnosis: Same  Surgeon: Atilano Ina   Assistants: none  Anesthesia: General endotracheal anesthesia  ASA Class: 2  Procedure Details  The patient was seen again in the Holding Room. The risks, benefits, complications, treatment options, and expected outcomes were discussed with the patient. The possibilities of reaction to medication, pulmonary aspiration, perforation of viscus, bleeding, recurrent infection, finding a normal gallbladder, the need for additional procedures, failure to diagnose a condition, the possible need to convert to an open procedure, and creating a complication requiring transfusion or operation were discussed with the patient. The likelihood of improving the patient's symptoms with return to their baseline status is good.  The patient and/or family concurred with the proposed plan, giving informed consent. The site of surgery properly noted. The patient was taken to Operating Room, identified as [name] [name] and the procedure verified as Laparoscopic Cholecystectomy with Intraoperative Cholangiogram. A Time Out was held and the above information confirmed.  Prior to the induction of general anesthesia, antibiotic prophylaxis was administered. General endotracheal anesthesia was then administered and tolerated well. After the induction, the abdomen was prepped with Chloraprep and draped in the sterile fashion. The patient was positioned in the supine position.  Local anesthetic agent was injected into the skin near the umbilicus and an incision made. We dissected down to the abdominal fascia with blunt dissection.  The fascia was incised vertically and we entered the  peritoneal cavity bluntly.  A pursestring suture of 0-Vicryl was placed around the fascial opening.  The patient's fascia was attenuated.  The Hasson cannula was inserted and secured with the stay suture.  Pneumoperitoneum was then created with CO2 and tolerated well without any adverse changes in the patient's vital signs. An 5-mm port was placed in the subxiphoid position.  Two 5-mm ports were placed in the right upper quadrant. All skin incisions were infiltrated with a local anesthetic agent before making the incision and placing the trocars.   We positioned the patient in reverse Trendelenburg, tilted slightly to the patient's left.  The gallbladder was identified, the fundus grasped and retracted cephalad. Adhesions were lysed bluntly and with the electrocautery where indicated, taking care not to injure any adjacent organs or viscus. The infundibulum was grasped and retracted laterally, exposing the peritoneum overlying the triangle of Calot. This was then divided and exposed in a blunt fashion. A critical view of the cystic duct and cystic artery was obtained.  The cystic duct was clearly identified and bluntly dissected circumferentially. The cystic duct was ligated with a clip distally.   An incision was made in the cystic duct and the [facility] cholangiogram catheter introduced. The catheter was secured using a clip. A cholangiogram was then obtained which showed good visualization of the distal and proximal biliary tree with no sign of filling defects or obstruction.  Contrast flowed easily into the duodenum. The catheter was then removed.   The cystic duct was then ligated with clips and divided. The cystic artery was identified, dissected free, ligated with clips and divided as well.   The gallbladder was dissected from the liver bed in retrograde fashion with the electrocautery with some spillage of bile from the gallbladder but no stones. The gallbladder was removed and placed in an Endocatch sac.   The gallbladder and Endocatch sac  were then removed through the umbilical port site. The liver bed was irrigated and inspected. Hemostasis was achieved with the electrocautery. Copious irrigation was utilized and was repeatedly aspirated until clear.  A piece of ethicon surgical SNoW was placed in the gallbladder fossa. The pursestring suture was used to close the umbilical fascia.    We again inspected the right upper quadrant for hemostasis.  The umbilical closure was inspected and there was no air leak and nothing trapped within the closure. Pneumoperitoneum was released as we removed the trocars.  4-0 Monocryl was used to close the skin.   Benzoin, steri-strips, and clean dressings were applied. The patient was then extubated and brought to the recovery room in stable condition. Instrument, sponge, and needle counts were correct at closure and at the conclusion of the case.   Findings: Chronic Cholecystitis with Cholelithiasis  Estimated Blood Loss: Minimal         Drains: none         Specimens: Gallbladder           Complications: None; patient tolerated the procedure well.         Disposition: PACU - hemodynamically stable.         Condition: stable  [name] [name]. [name] Campanile, MD, FACS General, Bariatric, & Minimally Invasive Surgery [facility], [place]

## 2012-05-05 NOTE — H&P (Signed)
[name] [name] is an 61 y.o. female.   Chief Complaint: here for surgery HPI: 61 yo AAF referred by Dr Russella Dar for evaluation of abdominal pain. The patient has a long-standing history of gastroesophageal reflux disease. She was also found to have a small esophageal stricture on upper endoscopy in 2010. She is on twice a day PPI therapy. She complains of a change in her chest and abdominal symptoms. She states recently she started having crampy epigastric discomfort that would last for about 4-5 hours. It was not associated with nausea or vomiting. The first episode she had a few weeks ago did not radiate. She states that she had another episode last night and it radiated to her right side. She states that normally it is difficult to tell the difference between her asthma and her reflux symptoms. She states that she will generally have chest tightness and wheezing. When she has had a real bad heartburn in the past she has drank milk which has ameliorated the discomfort. However on the past 2 occasions when she has drank milk it did not get any relief. She also tried taking her Levsin tablet and did not get any relief. She denies any diarrhea, constipation, melena, hematochezia, acholic stools, or NSAID use. She denies any fever or chills. She denies any emesis. She denies any regurgitation. She was recently told to increase her prednisone a few days ago because of concerns of a asthma flare. She has not scheduled an appointment to see her pulmonologist yet. She had a recent fall and broke her right thumb and is scheduled to undergo thumb surgery tomorrow.  Pt has had thumb surgery and is in PT for her thumb. Otherwise no major changes.  PMHx, PSHx, SOCHx, FAMHx, ALL reviewed   Past Medical History  Diagnosis Date  . Polyp of nasal cavity   . Extrinsic asthma, unspecified   . Allergic rhinitis, cause unspecified     failed allergy vaccine  . Unspecified sinusitis (chronic)   . 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 S/P dilatation of esophageal stricture 08-2008  . Hiatal hernia   . Diverticulosis   . PONV (postoperative nausea and vomiting)   . Anxiety     panic attacks  . GERD (gastroesophageal reflux disease)   . Arthritis     Past Surgical History  Procedure Date  . Nasal polyp surgery     x 3   . Tonsillectomy   . Bunionectomy     left foot  . Colonoscopy w/ biopsies and polypectomy   . Thumb fusion 10/13    rt hand  pinned but pin removed    Family History  Problem Relation Age of Onset  . Colon cancer Maternal Grandfather     unsure age of onset   . Cancer Maternal Grandfather     colon  . Stroke Maternal Aunt    Social History:  reports that she quit smoking about 23 years ago. She has never used smokeless tobacco. She reports that she does not drink alcohol or use illicit drugs.  Allergies:  Allergies  Allergen Reactions  . Aspirin Shortness Of Breath    Medications Prior to Admission  Medication Sig Dispense Refill  . albuterol (PROVENTIL HFA;VENTOLIN HFA) 108 (90 BASE) MCG/ACT inhaler Inhale 2 puffs into the lungs every 4 (four) hours as needed. For shortness of breath      . conjugated estrogens (PREMARIN) vaginal cream Place 0.5 g vaginally daily.       Marland Kitchen  hyoscyamine (LEVSIN SL) 0.125 MG SL tablet Place 0.25 mg under the tongue every 4 (four) hours as needed. For cramping      . omeprazole (PRILOSEC) 40 MG capsule Take 40 mg by mouth 2 (two) times daily.      . predniSONE (DELTASONE) 10 MG tablet Take 10 mg by mouth daily.        No results found for this or any previous visit (from the past 48 hour(s)). No results found.  Review of Systems  Constitutional: Negative for fever, chills and diaphoresis.  HENT: Negative for hearing loss and nosebleeds.   Eyes: Negative for blurred vision and double vision.  Respiratory: Negative for shortness of breath.   Cardiovascular: Negative for chest pain, palpitations, leg swelling and PND.    Genitourinary: Negative for dysuria.  Neurological: Negative for dizziness, speech change, seizures, loss of consciousness and weakness.  Psychiatric/Behavioral: Negative for suicidal ideas.    Blood pressure 141/85, pulse 88, temperature 98 F (36.7 C), temperature source Oral, resp. rate 18, SpO2 97.00%. Physical Exam  Vitals reviewed. Constitutional: She is oriented to person, place, and time. She appears well-developed and well-nourished. No distress.  HENT:  Head: Normocephalic and atraumatic.  Right Ear: External ear normal.  Left Ear: External ear normal.  Eyes: Conjunctivae normal are normal. No scleral icterus.  Neck: Neck supple.  Cardiovascular: Normal rate.   Respiratory: Effort normal. No stridor. No respiratory distress.  GI: Soft. She exhibits no distension. There is no tenderness.  Musculoskeletal: She exhibits no edema.  Neurological: She is alert and oriented to person, place, and time.  Skin: Skin is warm and dry. She is not diaphoretic.  Psychiatric: She has a normal mood and affect. Her behavior is normal. Judgment and thought content normal.     Assessment/Plan Symptomatic cholelithiasis  To OR for lap chole with ioc All questions asked and answered.   Mary Sella. Andrey Campanile, MD, FACS General, Bariatric, & Minimally Invasive Surgery [facility], [place]   [facility] M 05/05/2012, 11:46 AM

## 2012-05-05 NOTE — Anesthesia Procedure Notes (Signed)
Procedure Name: Intubation Date/Time: 05/05/2012 12:24 PM Performed by: Gayla Medicus Pre-anesthesia Checklist: Patient identified, Timeout performed, Emergency Drugs available, Suction available and Patient being monitored Patient Re-evaluated:Patient Re-evaluated prior to inductionOxygen Delivery Method: Circle system utilized Preoxygenation: Pre-oxygenation with 100% oxygen Intubation Type: IV induction Ventilation: Mask ventilation without difficulty Laryngoscope Size: Mac and 3 Grade View: Grade I Tube type: Oral Tube size: 7.5 mm Number of attempts: 1 Airway Equipment and Method: Stylet Placement Confirmation: ETT inserted through vocal cords under direct vision,  positive ETCO2 and breath sounds checked- equal and bilateral Secured at: 23 cm Tube secured with: Tape Dental Injury: Teeth and Oropharynx as per pre-operative assessment

## 2012-05-05 NOTE — Anesthesia Postprocedure Evaluation (Signed)
  Anesthesia Post-op Note  Patient: [name] [name]  Procedure(s) Performed: Procedure(s) (LRB) with comments: LAPAROSCOPIC CHOLECYSTECTOMY WITH INTRAOPERATIVE CHOLANGIOGRAM (N/A)  Patient Location: PACU  Anesthesia Type:General  Level of Consciousness: awake  Airway and Oxygen Therapy: Patient Spontanous Breathing  Post-op Pain: mild  Post-op Assessment: Post-op Vital signs reviewed  Post-op Vital Signs: stable  Complications: No apparent anesthesia complications

## 2012-05-05 NOTE — Preoperative (Signed)
Beta Blockers   Reason not to administer Beta Blockers: not prescribed 

## 2012-05-05 NOTE — Transfer of Care (Signed)
Immediate Anesthesia Transfer of Care Note  Patient: [name] [name]  Procedure(s) Performed: Procedure(s) (LRB) with comments: LAPAROSCOPIC CHOLECYSTECTOMY WITH INTRAOPERATIVE CHOLANGIOGRAM (N/A)  Patient Location: PACU  Anesthesia Type:General  Level of Consciousness: awake and alert   Airway & Oxygen Therapy: Patient Spontanous Breathing and Patient connected to nasal cannula oxygen  Post-op Assessment: Report given to PACU RN and Post -op Vital signs reviewed and stable  Post vital signs: Reviewed and stable  Complications: No apparent anesthesia complications

## 2012-05-08 ENCOUNTER — Encounter (HOSPITAL_COMMUNITY): Payer: Self-pay | Admitting: General Surgery

## 2012-05-09 ENCOUNTER — Telehealth (INDEPENDENT_AMBULATORY_CARE_PROVIDER_SITE_OTHER): Payer: Self-pay

## 2012-05-09 ENCOUNTER — Telehealth: Payer: Self-pay | Admitting: Gastroenterology

## 2012-05-09 NOTE — Telephone Encounter (Signed)
Patient advised.  She will call back for further questions or concerns

## 2012-05-09 NOTE — Telephone Encounter (Signed)
Patient calling into office complaining of epigastric pain.  Patient reports the discomfort seems to be after eating low fat foods.  Patient states she has not had a bowel movement and feel's constipated.  Patient advised to take Milk of Magnesia or Miralax, stool softener, drink plenty of fluids and try to increase her activity as tolerated.  Patient takes medication for GERD, advised patient to continue taking her medications as directed by her prescribing Physician.  Patient denies having any nausea, vomiting, chills or fever.  Patient advised to call our office if she has not had a bowel movement or any relief within 24 hours of starting Miralax or Milk of Magnesia. Patient state her husband will pick up Milk of Magnesia or Miralax this evening.  (s/p lap chole w/IOC 05/05/12)

## 2012-05-09 NOTE — Telephone Encounter (Signed)
Omeprazole 40 mg po bid long term Carafate 1 g tid as needed Mylanta or TUMS q2h as needed Top 2 were recommended at office visit with AE in 02/2012

## 2012-05-09 NOTE — Telephone Encounter (Signed)
Patient had a lap chole on Friday.  She reports that about 1 hour after a meal she has chest pain and tightness.  She is taking Prilosec BID as she was pre-op.  She is asked to stay on a bland diet and she needs to notify her surgeon of these symptoms.  Dr.  Russella Dar she is convinced that this is her reflux causing these symptoms.  Please advise any additional recommendations

## 2012-05-18 ENCOUNTER — Ambulatory Visit: Payer: Managed Care, Other (non HMO) | Admitting: Internal Medicine

## 2012-05-24 ENCOUNTER — Telehealth (INDEPENDENT_AMBULATORY_CARE_PROVIDER_SITE_OTHER): Payer: Self-pay | Admitting: General Surgery

## 2012-05-24 ENCOUNTER — Ambulatory Visit (INDEPENDENT_AMBULATORY_CARE_PROVIDER_SITE_OTHER): Payer: Managed Care, Other (non HMO) | Admitting: General Surgery

## 2012-05-24 ENCOUNTER — Encounter (INDEPENDENT_AMBULATORY_CARE_PROVIDER_SITE_OTHER): Payer: Self-pay | Admitting: General Surgery

## 2012-05-24 VITALS — BP 138/76 | HR 84 | Temp 97.2°F | Resp 16 | Ht 69.0 in | Wt 171.4 lb

## 2012-05-24 DIAGNOSIS — Z09 Encounter for follow-up examination after completed treatment for conditions other than malignant neoplasm: Secondary | ICD-10-CM

## 2012-05-24 NOTE — Patient Instructions (Signed)
Follow up with your GI doctor

## 2012-05-24 NOTE — Progress Notes (Signed)
Subjective:     Patient ID: [name] [name], female   DOB: [birth year], 61 y.o.   MRN: [id number]  HPI 61 year old female comes in for followup after going laparoscopic cholecystectomy with interoperative cholangiogram on November 22. She had some constipation after surgery but that resolved with milk of magnesia. She also had an episode about 4 days after surgery of epigastric pain that lasted for most of the day. She ended up calling  our office as well as her GI physicians office. She was otherwise asymptomatic. She states that nothing really helped but it eventually went away. She has had no additional episodes. She reports a good appetite. She denies any recent nausea or vomiting or fever or chills. She denies constipation.  Review of Systems     Objective:   Physical Exam BP 138/76  Pulse 84  Temp 97.2 F (36.2 C) (Temporal)  Resp 16  Ht 5\' 9"  (1.753 m)  Wt 171 lb 6 oz (77.735 kg)  BMI 25.31 kg/m2  Gen: alert, NAD, non-toxic appearing Pupils: equal, no scleral icterus Pulm: Lungs clear to auscultation, symmetric chest rise CV: regular rate and rhythm Abd: soft, nontender, nondistended. Well-healed trocar sites. No cellulitis. No incisional hernia Ext: no edema, no calf tenderness Skin: no rash, no jaundice     Assessment:     Status post laparoscopic cholecystectomy with Intra-Op cholangiogram    Plan:     We reviewed her pathology report showed chronic cholecystitis  And cholelithiasis. She was given a copy of her pathology report. The patient also has significant GERD. She appears to be doing well with respect to her gallbladder surgery. I have released her to full activities. I encouraged her to followup with her GI physician. Followup as needed  Mary Sella. Andrey Campanile, MD, FACS General, Bariatric, & Minimally Invasive Surgery [facility], [place]

## 2012-05-24 NOTE — Telephone Encounter (Signed)
Patient called in stating she had a post op appointment today with Dr. Andrey Campanile and wanted to know when she can resume work without restrictions. I pulled up the notes and advised the patient it stated she was cleared to resume full activities without restrictions.

## 2012-05-29 ENCOUNTER — Telehealth: Payer: Self-pay | Admitting: Gastroenterology

## 2012-05-29 NOTE — Telephone Encounter (Signed)
Not able to reach the patient at work, and her husband states she has not make it home yet.  She is unlikely to have her cell phone on and asked that I call her at work tomorrow

## 2012-05-30 NOTE — Telephone Encounter (Signed)
Patient reports that she is not able to eat the same foods that she was prior to having her gallbladder removed.  She is advised to follow a bland diet and the recommendations from the phone note from 05/09/12.  She will call back if this does not help for an office visit.  She has stopped the carafate and Maalox.

## 2012-06-01 ENCOUNTER — Encounter: Payer: Self-pay | Admitting: Internal Medicine

## 2012-06-01 ENCOUNTER — Ambulatory Visit (INDEPENDENT_AMBULATORY_CARE_PROVIDER_SITE_OTHER): Payer: Managed Care, Other (non HMO) | Admitting: Internal Medicine

## 2012-06-01 VITALS — BP 120/82 | HR 79 | Ht 69.0 in | Wt 172.4 lb

## 2012-06-01 DIAGNOSIS — J45909 Unspecified asthma, uncomplicated: Secondary | ICD-10-CM

## 2012-06-01 MED ORDER — ACLIDINIUM BROMIDE 400 MCG/ACT IN AEPB: 400.0000 ug | INHALATION_SPRAY | Freq: Two times a day (BID) | RESPIRATORY_TRACT | Status: DC

## 2012-06-01 NOTE — Progress Notes (Signed)
Subjective:    Patient ID: [name] [name], female    DOB: 01/15/1951, 61 y.o.   MRN: [id number]  HPI 10/14/10- 61 yoF with asthma, allergic rhinitis.  Last here August 31, 2010- Because of problems with GERD and yeast we were hoping to reduce prednisone which she has been on for much of the time since the 1970's. She has maintained mostly on 5 mg daily. We gave 1 mg tabs, but she didn't feel that 4 mg held her.  Heart burn is better and congestion a bit worse. Pollen season usually gets her some congested. She had been on Xolair x 3 years, then wheezed worse and it was stopped.  Today she feels ok, but changes day-to-day. Main trigger for her she thinks is temperature change/ weather. [place] trip - wheezed in [place].   11/13/10- Asthma, allergic rhinitis/ recurrent sinusitis Called last week and we had her take prednisone 10 mg daily x 7 days then go back to 5 mg daily. She says 5 mg isn't enough. Post nasal drip is getting in her lungs and requiring use of rescue inhaler several times daily. Denies dyspnea or headache. Occasional throat clearing of yellow mucus. Little sustained wheeze, just feels tight.  01/11/11- 61 yoF former smoker, followed for asthma, allergic rhinitis/ hx nasal polyps/ aspirin allergy, recurrent sinusitis complicated by GERD Maintenance prednisone was on 5 mg daily, then went up to 40 and now back to 20/day.  Feels fine now, but last week throat tickle progressed into wheeze and she called [place]. [place] home[place], basement, carpet. Says there was mold , but she can't smell so she can't tell. Carpet was replaced and visible leak fixed. Also aware of reflux- diet controlled and continues Zegerid.   04/13/11- 61 yoF former smoker, followed for asthma, allergic rhinitis/ hx nasal polyps/ aspirin allergy, recurrent sinusitis complicated by GERD And she declines flu vaccine. She tried Xolair injections for 3 years with no benefit. At last visit we tried tapering  prednisone to maintenance 5 mg daily. She developed increasing nasal congestion and postnasal drainage so she went back up to 10 mg of prednisone daily. With that she feels better but she says that is not enough prednisone to restore her sense of smell. Using rescue inhaler about one time daily for tickle and chest tightness. Daily Water Pik for her nose returns some yellow material. She does not feel that she has a sinus infection. She has not had recent followup with Dr. Erik Obey ENT and was to see him again as needed. He had told her her nasal problems were allergy  .+nasal congestion, post nasal drip,  11/16/11-  61 yoF former smoker, followed for asthma, allergic rhinitis/ hx nasal polyps/ aspirin allergy, recurrent sinusitis complicated by GERD She is past the spring seasonal rhinitis time for her. Had a skin rash diagnosed as "allergic" at an urgent care they increase prednisone and gave cortisone cream but she is now back to her maintenance prednisone 10 mg daily. The rash is better and she feels fairly stable. Denies wheeze or cough. Still no sense of smell even on a prednisone 60 mg taper.  03/16/2012 Acute OV  Presents for an acute office visit.  Called in 10/1 with 3 weeks of cough /wheezing, prednisone taper sent  She returns today feeling some better but still has some drainage w/ tickle in throat and throat clearing often.  Went to UC initially  w/ depo x2, pred pak and zpak.  Denies cough, SOB, tightness in chest. Or edema.  Currently healing from Rigth thumb fx in cast  Nasal sprays not helping.   06/01/12- 61 yoF former smoker, followed for asthma, allergic rhinitis/ hx nasal polyps/ aspirin allergy, recurrent sinusitis complicated by GERD folllows for : pt states still having gerd like sxs since having gallbladder surg 05-05-12.  states has been having some sob,wheezing,.throat clearing.chest tightness. denies any cough ,fever, When she returned to her GERD diet after gallbladder  surgery, she began experiencing tightness in throat and upper chest. This feeling comes and goes and has noticed also on waking. She is continued chronic maintenance prednisone 10 mg daily after repeatedly trying to reduce the dose and feeling that her breathing deteriorated. Rescue inhaler has little effect. Minor throat tickle. Denies coughing. She continues omeprazole and Carafate and does not feel heartburn. She does not distinguish well between reflux and asthma symptoms CXR 05/01/12-reviewed IMPRESSION:  No evidence for acute cardiopulmonary abnormality.  Original Report Authenticated By: Norva Pavlov, M.D.  Office Spirometry 06/01/12- moderate obstructive airways disease. FVC 3.15/85%, FEV1 1.87/65%, FEV1/FVC 0.59% FEF 25-75% 0.92/36% PFT 02/01/08- we compared her old PFT-mild obstructive airways disease with minimal response to bronchodilator. FVC 4.14/107%, FEV1 2.83/98%, FEV1/FVC 0.68, FEF 25-75% 1.72/56%. Scores have declined.  ROS-see HPI Constitutional:   No-   weight loss, night sweats, fevers, chills, fatigue, lassitude. HEENT:   No-  headaches, difficulty swallowing, tooth/dental problems, sore throat,       No-  sneezing, itching, ear ache, nasal congestion, post nasal drip,  CV:  No-   chest pain, orthopnea, PND, swelling in lower extremities, anasarca, dizziness, palpitations Resp: + shortness of breath with exertion or at rest.              No-   productive cough,  No non-productive cough,  No- coughing up of blood.              No-   change in color of mucus.  No- wheezing.   Skin: No-   rash or lesions. GI:  No-   heartburn, indigestion, abdominal pain, nausea, vomiting,  GU:  MS:  No-   joint pain or swelling.   Neuro-     nothing unusual Psych:  No- change in mood or affect. No depression or anxiety.  No memory loss.  Objective:  OBJ- Physical Exam General- Alert, Oriented, Affect-appropriate, Distress- none acute Skin- rash-none, lesions- none, excoriation-  none Lymphadenopathy- none Head- atraumatic            Eyes- Gross vision intact, PERRLA, conjunctivae and secretions clear            Ears- Hearing, canals-normal            Nose- Clear, no-Septal dev, mucus, polyps, erosion, perforation             Throat- Mallampati II , mucosa clear , drainage- none, tonsils- atrophic Neck- flexible , trachea midline, no stridor , thyroid nl, carotid no bruit Chest - symmetrical excursion , unlabored           Heart/CV- RRR , no murmur , no gallop  , no rub, nl s1 s2                           - JVD- none , edema- none, stasis changes- none, varices- none           Lung- clear to P&A, wheeze- none, cough- none , dullness-none,  rub- none           Chest wall-  Abd-  Br/ Gen/ Rectal- Not done, not indicated Extrem- cyanosis- none, clubbing, none, atrophy- none, strength- nl Neuro- grossly intact to observation

## 2012-06-01 NOTE — Patient Instructions (Addendum)
Office Spirometry - done  Sample Tudorza inhaler    1 puff, two times daily        Order- schedule PFT and 6 MWT on return visit

## 2012-06-02 ENCOUNTER — Telehealth: Payer: Self-pay | Admitting: Gastroenterology

## 2012-06-02 ENCOUNTER — Telehealth: Payer: Self-pay | Admitting: Critical Care Medicine

## 2012-06-02 ENCOUNTER — Ambulatory Visit (INDEPENDENT_AMBULATORY_CARE_PROVIDER_SITE_OTHER): Payer: Managed Care, Other (non HMO) | Admitting: Gastroenterology

## 2012-06-02 ENCOUNTER — Encounter: Payer: Self-pay | Admitting: Gastroenterology

## 2012-06-02 VITALS — BP 120/64 | HR 88 | Ht 69.0 in | Wt 169.0 lb

## 2012-06-02 DIAGNOSIS — R079 Chest pain, unspecified: Secondary | ICD-10-CM

## 2012-06-02 DIAGNOSIS — K219 Gastro-esophageal reflux disease without esophagitis: Secondary | ICD-10-CM

## 2012-06-02 MED ORDER — RANITIDINE HCL 150 MG PO TABS: 150.0000 mg | ORAL_TABLET | Freq: Every day | ORAL | Status: DC

## 2012-06-02 NOTE — Telephone Encounter (Signed)
Call from Ms. Amstutz stating that she is having continued c/o of throat tightness despite being seen in clinic yesterday and receiving samples of a new inhaler.  This has made it difficult for her to obtain adequate sleep.  This morning she woke up with similar symptoms of throat tightness.  She cannot discern whether this is asthma and/or GERD symptoms.  She has maintained a bland diet as recommended by GI and had restarted her carafate and mylanta per GI recommendations.  She remains on Prilosec and Levsin.    On the phone the patient is alert, oriented in NAD.  She speaks in complete sentences and has no wheezing.  Plan: I reassured her that although these symptoms are distressing there does not appear to be an emergency I have recommended she take an additional dose of Carafate this AM and dose her Prilosec early If these interventions do not work I have encouraged her to recontact Dr. Roxy Cedar office as this was the last encounter.  The decision to pursue further evaluation/treatment can be determined at that time.

## 2012-06-02 NOTE — Telephone Encounter (Signed)
Pt states that she has some irritation in her throat. Saw her allergist yesterday and per pt he told her to follow-up with GI. Pt thinks something may be irritating her throat and causing her to feel like she is wheezing. Pt scheduled to see Doug Sou PA today at 1:30pm. Pt aware of appt date and time.

## 2012-06-02 NOTE — Patient Instructions (Addendum)
It has been recommended to you by your physician that you have a(n) Upper Endoscopy completed. Per your request, we did not schedule the procedure(s) today. Please contact our office at [phone] should you decide to have the procedure completed.   We have sent the following medications to your pharmacy for you to pick up at your convenience Zantac, please take as directed. ___________________________________________________________________________________________________________  Diet for Gastroesophageal Reflux Disease, Adult Reflux (acid reflux) is when acid from your stomach flows up into the esophagus. When acid comes in contact with the esophagus, the acid causes irritation and soreness (inflammation) in the esophagus. When reflux happens often or so severely that it causes damage to the esophagus, it is called gastroesophageal reflux disease (GERD). Nutrition therapy can help ease the discomfort of GERD. FOODS OR DRINKS TO AVOID OR LIMIT  Smoking or chewing tobacco. Nicotine is one of the most potent stimulants to acid production in the gastrointestinal tract.  Caffeinated and decaffeinated coffee and black tea.  Regular or low-calorie carbonated beverages or energy drinks (caffeine-free carbonated beverages are allowed).   Strong spices, such as black pepper, white pepper, red pepper, cayenne, curry powder, and chili powder.  Peppermint or spearmint.  Chocolate.  High-fat foods, including meats and fried foods. Extra added fats including oils, butter, salad dressings, and nuts. Limit these to less than 8 tsp per day.  Fruits and vegetables if they are not tolerated, such as citrus fruits or tomatoes.  Alcohol.  Any food that seems to aggravate your condition. If you have questions regarding your diet, call your caregiver or a registered dietitian. OTHER THINGS THAT MAY HELP GERD INCLUDE:   Eating your meals slowly, in a relaxed setting.  Eating 5 to 6 small meals per day  instead of 3 large meals.  Eliminating food for a period of time if it causes distress.  Not lying down until 3 hours after eating a meal.  Keeping the head of your bed raised 6 to 9 inches (15 to 23 cm) by using a foam wedge or blocks under the legs of the bed. Lying flat may make symptoms worse.  Being physically active. Weight loss may be helpful in reducing reflux in overweight or obese adults.  Wear loose fitting clothing EXAMPLE MEAL PLAN This meal plan is approximately 2,000 calories based on https://www.bernard.org/ meal planning guidelines. Breakfast   cup cooked oatmeal.  1 cup strawberries.  1 cup low-fat milk.  1 oz almonds. Snack  1 cup cucumber slices.  6 oz yogurt (made from low-fat or fat-free milk). Lunch  2 slice whole-wheat bread.  2 oz sliced [place].  2 tsp mayonnaise.  1 cup blueberries.  1 cup snap peas. Snack  6 whole-wheat crackers.  1 oz string cheese. Dinner   cup brown rice.  1 cup mixed veggies.  1 tsp olive oil.  3 oz grilled fish. Document Released: 05/31/2005 Document Revised: 08/23/2011 Document Reviewed: 04/16/2011 [facility] Patient Information 2013 [place], [place].

## 2012-06-02 NOTE — Progress Notes (Addendum)
06/02/2012 [name] [name] [id number] 05-May-1951   History of Present Illness: Patient is a 61 year old female who presents to our office today with complaint of chest tightness and tightness in her throat. She does have asthma and she saw her pulmonologist yesterday, however he does not think this is secondary to her asthma. He recommended that she be reevaluated in our office for her reflux issues. She also has a history of allergies and nasal polyps, however her ENT physician, who was seen back in November, told her that everything looked well from that standpoint at that time. She states that this tightness occurs after eating as well as at bedtime and when she gets into a car. She is taking omeprazole 40 mg twice a day as well as Carafate, which is prescribed as 4 times a day, however she has only been taking twice a day. She has tried Maalox, Mylanta, and Gaviscon but they have not helped her symptoms. They are hindering her from sleeping. She states all of this has been occurring since her gallbladder surgery in November, but at her office visits dating back in September she had similar complaints then. She denies any burning or acid discomfort. There are no complaints of dysphagia or odynophasia. She has been on zegerid and Nexium in the past for her GI issues, but they were eventually discontinued, and she was placed back on omeprazole. 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 She does have a history of anxiety and panic disorders and has Valium at home.   Current Medications, Allergies, Past Medical History, Past Surgical History, Family History and Social History were reviewed in [employer] record.   Physical Exam: BP 120/64  Pulse 88  Ht 5\' 9"  (1.753 m)  Wt 169 lb (76.658 kg)  BMI 24.96 kg/m2 General: Well developed, female in no acute distress Head: Normocephalic and atraumatic Eyes:  sclerae  anicteric, conjunctiva pink  Ears: Normal auditory acuity Lungs: Clear throughout to auscultation Heart: Regular rate and rhythm. Abdomen: Soft, non tender and non distended. No masses, no hepatomegaly. Normal bowel sounds. Musculoskeletal: Symmetrical with no gross deformities  Neurological: Alert oriented x 4, grossly nonfocal Psychological:  Alert and cooperative. Normal mood and affect  Assessment and Recommendations: -Chest tightness and tightness in her throat: Her pulmonologist does not think it is secondary to her asthma and told her to be reevaluated from a GI standpoint with her reflux. I am not convinced that this is reflux related either, however we will evaluate with repeat EGD (may want to perform empiric dilation or esophageal biopsies to rule out eosinophilic esophagitis). She will continue acid suppression with omeprazole 40 mg twice a day. Will add Zantac 150 mg at bedtime. She should continue her Carafate as directed as well. I am wondering if some of this is related to anxiety. Her symptoms do occur after eating and at night time, but happen when she is riding in a car as well. She does have history of anxiety and panic attacks and has Valium at home. I have asked her to try taking that as prescribed at bedtime. Pending the results of her EGD she may need esophageal manometry, possible pH study,  or 24-hour impedance.  Addendum: Reviewed and agree with initial management. Beverley Fiedler, MD

## 2012-06-12 ENCOUNTER — Other Ambulatory Visit: Payer: Self-pay | Admitting: Internal Medicine

## 2012-06-12 MED ORDER — PREDNISONE 10 MG PO TABS: 10.0000 mg | ORAL_TABLET | Freq: Every day | ORAL | Status: DC

## 2012-06-12 NOTE — Telephone Encounter (Signed)
Ok to refill prednisone this time and once more

## 2012-06-12 NOTE — Telephone Encounter (Signed)
Pharmacy requesting  Prednisone 10mg  take 1 tablet by mouth daily  #30 Last fill 05-05-12 Allergies  Allergen Reactions  . Aspirin Shortness Of Breath   Dr Maple Hudson Please advise  Thank you

## 2012-06-12 NOTE — Assessment & Plan Note (Addendum)
We are not seeing a significant asthma pattern/response to bronchodilator now. I am surprised at how much her scores seem to have deteriorated since PFT of 2009, although the recording instruments were different. I question the source of her tight feeling in the upper chest. I do not think this is angina and maybe bronchospasm or esophageal. Plan-tudorza sample. Update formal PFT for true comparison with 2009.

## 2012-06-23 ENCOUNTER — Telehealth: Payer: Self-pay | Admitting: Internal Medicine

## 2012-06-23 MED ORDER — ACLIDINIUM BROMIDE 400 MCG/ACT IN AEPB: 400.0000 ug | INHALATION_SPRAY | Freq: Two times a day (BID) | RESPIRATORY_TRACT | Status: DC

## 2012-06-23 MED ORDER — CEFDINIR 300 MG PO CAPS: 300.0000 mg | ORAL_CAPSULE | Freq: Two times a day (BID) | ORAL | Status: DC

## 2012-06-23 NOTE — Telephone Encounter (Signed)
Called and spoke with pt and she is requesting a refill of the omnicef---she stated that she feels she is getting a sinus infection---pain and pressure in her face and head and worse when she bends over.  Pt also needing a refill of the tudorza and this has already been sent to the pharmacy.  CY please advise. Thanks  Last ov--06/01/2012 Next ov--07/20/2012   Allergies  Allergen Reactions  . Aspirin Shortness Of Breath

## 2012-06-23 NOTE — Telephone Encounter (Signed)
Per CY-okay to give Omnicef 300mg  #20 take 1 po bid no refills.

## 2012-06-23 NOTE — Telephone Encounter (Signed)
Spoke with pt and notified rxs were sent to pharm She verbalized understanding and states nothing further needed

## 2012-06-23 NOTE — Telephone Encounter (Signed)
lmomtcb  

## 2012-06-23 NOTE — Telephone Encounter (Signed)
LMTCB

## 2012-06-23 NOTE — Telephone Encounter (Signed)
Pt returned call. [name] [name] °

## 2012-06-26 ENCOUNTER — Telehealth: Payer: Self-pay | Admitting: Internal Medicine

## 2012-06-26 MED ORDER — TIOTROPIUM BROMIDE MONOHYDRATE 18 MCG IN CAPS: 18.0000 ug | ORAL_CAPSULE | Freq: Every day | RESPIRATORY_TRACT | Status: DC

## 2012-06-26 NOTE — Telephone Encounter (Signed)
pts insurance doesn't cover the [place] .they will cover spiriva.  Allergies  Allergen Reactions  . Aspirin Shortness Of Breath   Dr Maple Hudson Please advise Thank young

## 2012-06-26 NOTE — Telephone Encounter (Signed)
Called [facility], spoke with [place].  Informed ok to change Tudorza to Spiriva # 30 1 qd.  She verbalized understanding and voiced no further questions or concerns at this time.  lmomtcb on pt's home and work #s to inform her of change.

## 2012-06-26 NOTE — Telephone Encounter (Signed)
Per CY-okay to try again instead of Tudorza give Rx for Spirivia #30 1 qd with prn refills.

## 2012-06-27 NOTE — Telephone Encounter (Signed)
LMTCB x 1 

## 2012-06-28 NOTE — Telephone Encounter (Signed)
I spoke with the [name] [name] and she states that she went to pharmacy to get Spiriva and it will be $75. I advised the [name] [name] that the best thing to do is to get a copy of her drug formulary and call us back or make an appt to discuss what is the best option that is covered on her list. [name], CMA

## 2012-06-28 NOTE — Telephone Encounter (Signed)
LMTCBx2. Akeelah Seppala, CMA  

## 2012-07-17 ENCOUNTER — Telehealth: Payer: Self-pay | Admitting: Internal Medicine

## 2012-07-17 ENCOUNTER — Telehealth: Payer: Self-pay | Admitting: Gastroenterology

## 2012-07-17 ENCOUNTER — Other Ambulatory Visit: Payer: Self-pay | Admitting: Gastroenterology

## 2012-07-17 MED ORDER — RANITIDINE HCL 150 MG PO TABS: 150.0000 mg | ORAL_TABLET | Freq: Every day | ORAL | Status: DC

## 2012-07-17 MED ORDER — ALBUTEROL SULFATE HFA 108 (90 BASE) MCG/ACT IN AERS: 2.0000 | INHALATION_SPRAY | RESPIRATORY_TRACT | Status: DC | PRN

## 2012-07-17 NOTE — Telephone Encounter (Signed)
Patient c/o abdominal pain that started after eating tuna salad and onion.  Patient asking what she can take for her abdominal cramping.  She is advised to try the zantac that was prescribed at the last office visit in 05/2012.  She never picked this up.  She also tried aloe vera and this has not helped.  She read on the internet that this helps with reflux and wanted to know if I knew anything about it.  Patient advised that not something that the practice recommends.  Patient advised to try hyoscyamine, bland diet, and add zantac as previously ordered.

## 2012-07-17 NOTE — Telephone Encounter (Signed)
Per CY-okay to change Ventolin HFA to ProAir HFA. Pt is aware and Rx has been sent to pharmacy on file(verified with patient). I have also changed this on patients medication list.

## 2012-07-20 ENCOUNTER — Ambulatory Visit (INDEPENDENT_AMBULATORY_CARE_PROVIDER_SITE_OTHER): Payer: Managed Care, Other (non HMO) | Admitting: Internal Medicine

## 2012-07-20 ENCOUNTER — Other Ambulatory Visit: Payer: Self-pay | Admitting: Internal Medicine

## 2012-07-20 ENCOUNTER — Encounter: Payer: Self-pay | Admitting: Internal Medicine

## 2012-07-20 VITALS — BP 122/78 | HR 101 | Ht 69.0 in | Wt 171.0 lb

## 2012-07-20 DIAGNOSIS — J449 Chronic obstructive pulmonary disease, unspecified: Secondary | ICD-10-CM

## 2012-07-20 DIAGNOSIS — R0600 Dyspnea, unspecified: Secondary | ICD-10-CM

## 2012-07-20 DIAGNOSIS — R0989 Other specified symptoms and signs involving the circulatory and respiratory systems: Secondary | ICD-10-CM

## 2012-07-20 DIAGNOSIS — R0609 Other forms of dyspnea: Secondary | ICD-10-CM

## 2012-07-20 DIAGNOSIS — J330 Polyp of nasal cavity: Secondary | ICD-10-CM

## 2012-07-20 DIAGNOSIS — J45909 Unspecified asthma, uncomplicated: Secondary | ICD-10-CM

## 2012-07-20 LAB — PULMONARY FUNCTION TEST

## 2012-07-20 MED ORDER — AEROCHAMBER MV MISC: Status: DC

## 2012-07-20 MED ORDER — MOMETASONE FURO-FORMOTEROL FUM 100-5 MCG/ACT IN AERO: 2.0000 | INHALATION_SPRAY | Freq: Two times a day (BID) | RESPIRATORY_TRACT | Status: DC

## 2012-07-20 NOTE — Progress Notes (Signed)
Subjective:    Patient ID: [name] [name], female    DOB: 01/15/1951, 62 y.o.   MRN: [id number]  HPI 10/14/10- 12 yoF with asthma, allergic rhinitis.  Last here August 31, 2010- Because of problems with GERD and yeast we were hoping to reduce prednisone which she has been on for much of the time since the 1970's. She has maintained mostly on 5 mg daily. We gave 1 mg tabs, but she didn't feel that 4 mg held her.  Heart burn is better and congestion a bit worse. Pollen season usually gets her some congested. She had been on Xolair x 3 years, then wheezed worse and it was stopped.  Today she feels ok, but changes day-to-day. Main trigger for her she thinks is temperature change/ weather. [place] trip - wheezed in [place].   11/13/10- Asthma, allergic rhinitis/ recurrent sinusitis Called last week and we had her take prednisone 10 mg daily x 7 days then go back to 5 mg daily. She says 5 mg isn't enough. Post nasal drip is getting in her lungs and requiring use of rescue inhaler several times daily. Denies dyspnea or headache. Occasional throat clearing of yellow mucus. Little sustained wheeze, just feels tight.  01/11/11- 62 yoF former smoker, followed for asthma, allergic rhinitis/ hx nasal polyps/ aspirin allergy, recurrent sinusitis complicated by GERD Maintenance prednisone was on 5 mg daily, then went up to 40 and now back to 20/day.  Feels fine now, but last week throat tickle progressed into wheeze and she called [place]. [place], basement, carpet. Says there was mold , but she can't smell so she can't tell. Carpet was replaced and visible leak fixed. Also aware of reflux- diet controlled and continues Zegerid.   04/13/11- 62 yoF former smoker, followed for asthma, allergic rhinitis/ hx nasal polyps/ aspirin allergy, recurrent sinusitis complicated by GERD And she declines flu vaccine. She tried Xolair injections for 3 years with no benefit. At last visit we tried tapering  prednisone to maintenance 5 mg daily. She developed increasing nasal congestion and postnasal drainage so she went back up to 10 mg of prednisone daily. With that she feels better but she says that is not enough prednisone to restore her sense of smell. Using rescue inhaler about one time daily for tickle and chest tightness. Daily Water Pik for her nose returns some yellow material. She does not feel that she has a sinus infection. She has not had recent followup with Dr. Erik Obey ENT and was to see him again as needed. He had told her her nasal problems were allergy  .+nasal congestion, post nasal drip,  11/16/11-  62 yoF former smoker, followed for asthma, allergic rhinitis/ hx nasal polyps/ aspirin allergy, recurrent sinusitis complicated by GERD She is past the spring seasonal rhinitis time for her. Had a skin rash diagnosed as "allergic" at an urgent care they increase prednisone and gave cortisone cream but she is now back to her maintenance prednisone 10 mg daily. The rash is better and she feels fairly stable. Denies wheeze or cough. Still no sense of smell even on a prednisone 60 mg taper.  03/16/2012 Acute OV  Presents for an acute office visit.  Called in 10/1 with 3 weeks of cough /wheezing, prednisone taper sent  She returns today feeling some better but still has some drainage w/ tickle in throat and throat clearing often.  Went to UC initially  w/ depo x2, pred pak and zpak.  Denies cough, SOB, tightness in chest. Or edema.  Currently healing from Rigth thumb fx in cast  Nasal sprays not helping.   06/01/12- 62 yoF former smoker, followed for asthma, allergic rhinitis/ hx nasal polyps/ aspirin allergy, recurrent sinusitis complicated by GERD folllows for : pt states still having gerd like sxs since having gallbladder surg 05-05-12.  states has been having some sob,wheezing,.throat clearing.chest tightness. denies any cough ,fever, When she returned to her GERD diet after gallbladder  surgery, she began experiencing tightness in throat and upper chest. This feeling comes and goes and has noticed also on waking. She is continued chronic maintenance prednisone 10 mg daily after repeatedly trying to reduce the dose and feeling that her breathing deteriorated. Rescue inhaler has little effect. Minor throat tickle. Denies coughing. She continues omeprazole and Carafate and does not feel heartburn. She does not distinguish well between reflux and asthma symptoms CXR 05/01/12-reviewed IMPRESSION:  No evidence for acute cardiopulmonary abnormality.  Original Report Authenticated By: Norva Pavlov, M.D.  Office Spirometry 06/01/12- moderate obstructive airways disease. FVC 3.15/85%, FEV1 1.87/65%, FEV1/FVC 0.59% FEF 25-75% 0.92/36% PFT 02/01/08- we compared her old PFT-mild obstructive airways disease with minimal response to bronchodilator. FVC 4.14/107%, FEV1 2.83/98%, FEV1/FVC 0.68, FEF 25-75% 1.72/56%. Scores have declined.  07/20/12- 62 yoF former smoker, followed for asthma, allergic rhinitis/ hx nasal polyps/ aspirin allergy, recurrent sinusitis complicated by GERD FOLLOWS FOR: review and PFT results with patient. She says she is "frustrated" by her shortness of breath, but not coughing. Still on chronic prednisone 10 mg daily. She continues to notice GERD. Albuterol helps chest tightness. She asks primary care referral. PFT 07/20/2012-moderate obstructive airways disease with response to bronchodilator, normal lung volumes, diffusion mildly reduced. FVC 3.87/107%, FEV1 2.37/88%, FEV1/FVC 0.61, FEF 25-75% 1.12/39%. TLC 104%, DLCO 71%. 07/20/2012-98%, 97%, 98%, 444 m. Noted hip pain. No oxygen limitation.  //a1AT next visit//  ROS-see HPI Constitutional:   No-   weight loss, night sweats, fevers, chills, fatigue, lassitude. HEENT:   No-  headaches, difficulty swallowing, tooth/dental problems, sore throat,       No-  sneezing, itching, ear ache, nasal congestion, post  nasal drip,  CV:  No-   chest pain, orthopnea, PND, swelling in lower extremities, anasarca, dizziness, palpitations Resp: + shortness of breath with exertion or at rest.              No-   productive cough,  No non-productive cough,  No- coughing up of blood.              No-   change in color of mucus.  No- wheezing.   Skin: No-   rash or lesions. GI:  + heartburn, indigestion, no-abdominal pain, nausea, vomiting,  GU:  MS:  No-   joint pain or swelling.   Neuro-     nothing unusual Psych:  No- change in mood or affect. No depression or anxiety.  No memory loss.  Objective:  OBJ- Physical Exam General- Alert, Oriented, Affect-appropriate, Distress- none acute Skin- rash-none, lesions- none, excoriation- none Lymphadenopathy- none Head- atraumatic            Eyes- Gross vision intact, PERRLA, conjunctivae and secretions clear            Ears- Hearing, canals-normal            Nose- Clear, no-Septal dev, mucus, polyps, erosion, perforation             Throat- Mallampati II , mucosa clear ,  drainage- none, tonsils- atrophic Neck- flexible , trachea midline, no stridor , thyroid nl, carotid no bruit Chest - symmetrical excursion , unlabored           Heart/CV- RRR , no murmur , no gallop  , no rub, nl s1 s2, P2 not increased.                           - JVD+2-3 cm R>L , edema- none, stasis changes- none, varices- none           Lung- clear to P&A, wheeze- none, cough- none , dullness-none, rub- none           Chest wall-  Abd-  Br/ Gen/ Rectal- Not done, not indicated Extrem- cyanosis- none, clubbing, none, atrophy- none, strength- nl Neuro- grossly intact to observation

## 2012-07-20 NOTE — Progress Notes (Signed)
PFT done today. 

## 2012-07-20 NOTE — Patient Instructions (Addendum)
Order- 2 D Echocardiogram     Dx   Dyspnea  Order aerochamber- script written  Sample Dulera 100   2 puffs then rinse mouth, twice daily every day- maintenance inhaler  You can still use your rescue albuterol inhaler as needed  Encourage regular aerobic/ cardio exercise for stamina- like walking or stationary bike  Order- referral to establish primary care - patient request

## 2012-07-22 ENCOUNTER — Telehealth: Payer: Self-pay | Admitting: Pulmonary Disease

## 2012-07-22 NOTE — Telephone Encounter (Signed)
62 y/o pt DrYoung> called w/ c/o chest discomfort; pt vague, poor historian; states just Dx w/ early emphysema- on Dulera, Ventolin, Pred10mg /d; using the Ventolin 4-5x daily; states she's SOB w/ activity & notes wheezing when she is sitting still; Chest discomfort has been present for some time but she can't say how long, if sharp or dull, apparently w/o radiation; she does not have a primary doctor or cardiologist- old EKG 11/13 done by [facility] when she had GB resected showed NSR, PACs, otherw wnl... Rec> rest, apply heating pad to chest wall, take Tylenol vs Advil for the discomfort and go to the ER for further eval if the CP persists & doesn't respond to Rx.Marland KitchenMarland Kitchen

## 2012-07-23 ENCOUNTER — Other Ambulatory Visit: Payer: Self-pay | Admitting: Internal Medicine

## 2012-07-24 ENCOUNTER — Telehealth: Payer: Self-pay | Admitting: Internal Medicine

## 2012-07-24 ENCOUNTER — Telehealth: Payer: Self-pay | Admitting: Gastroenterology

## 2012-07-24 NOTE — Telephone Encounter (Signed)
Spoke with pt She states went to ED over the wkend at [facility] with CP Had cxr and EKG which were normal She states wants to cancel her ECHO since she had EKG I advised that these tests are different and should keep appt for ECHO  She verbalized understanding She wants CDY to be aware that she is still waking up at night and having to use inhaler, has to use 4 puffs before gets any relief Please advise thanks! Last ov 07/20/12 Next ov not pending yet Allergies  Allergen Reactions  . Aspirin Shortness Of Breath

## 2012-07-24 NOTE — Telephone Encounter (Signed)
Per CY-o Echo is different test for different information; should keep appointment. Also, 4 puffs if necessary till we get test results.

## 2012-07-24 NOTE — Telephone Encounter (Signed)
I spoke with pt. Aware of CDY recs. Nothing further was needed

## 2012-07-24 NOTE — Telephone Encounter (Signed)
Patient reluctantly agreed to schedule EGD for 08/08/12.  She states she will call back and cancel if the ECHO shows an indication for chest pain.

## 2012-07-24 NOTE — Telephone Encounter (Signed)
EGD was recommended at 06/02/12 office visit.  Is this scheduled? Does she want to proceed?

## 2012-07-24 NOTE — Telephone Encounter (Signed)
She wanted Dr. Russella Dar to be aware she was seen at [facility] for chest pain.  Patient has changed to pantoprozole from Prilosec and advised her to use gavascon.    She has an ECHO pending per pulmonary office notes.  She will call back for any GI concerns.  I have updated her medication list with PPI changes

## 2012-07-25 ENCOUNTER — Telehealth: Payer: Self-pay | Admitting: Internal Medicine

## 2012-07-25 MED ORDER — ALBUTEROL SULFATE HFA 108 (90 BASE) MCG/ACT IN AERS: 2.0000 | INHALATION_SPRAY | Freq: Four times a day (QID) | RESPIRATORY_TRACT | Status: DC | PRN

## 2012-07-25 MED ORDER — MOMETASONE FURO-FORMOTEROL FUM 200-5 MCG/ACT IN AERO: 2.0000 | INHALATION_SPRAY | Freq: Two times a day (BID) | RESPIRATORY_TRACT | Status: DC

## 2012-07-25 NOTE — Telephone Encounter (Signed)
I spoke with pt. She states she is still have chest tx and wheezing after using the rescue inhaler. She stated it taking   1 1/2 after using the inhaler for the symptoms to go away and then she is fine for 30 minutes  And then the symptoms come back. She is using 4 puffs at a time. She is wanting to know what she can do int he 4 hour window before she can do another 4 puffs. Please advise Dr. Maple Hudson thanks Last OV 07/20/12 Allergies  Allergen Reactions  . Aspirin Shortness Of Breath

## 2012-07-25 NOTE — Telephone Encounter (Signed)
Spoke with pt Notified of recs per CDY She states unable to take neb txs b/c they cause her to have panic attacks She states [facility] and could not tolerate Are there any further recs?? Please advise, thanks! Allergies  Allergen Reactions  . Aspirin Shortness Of Breath

## 2012-07-25 NOTE — Telephone Encounter (Signed)
Let's order for her through DME:  For dx asthma with bronchitis Nebulizer machine Duoneb # 75 ampules, ref prn      1 4 times daily if needed

## 2012-07-25 NOTE — Telephone Encounter (Signed)
Pt is aware CY recommendations. I will place samples up front for pt to pick up.

## 2012-07-25 NOTE — Telephone Encounter (Signed)
Refill sent. Pt is aware. Jennifer Castillo, CMA  

## 2012-07-25 NOTE — Telephone Encounter (Signed)
Per CY-she is using 100/5 mg of Dulera; Have her try sample of Dulera 200/5 2 puffs BID and RINSE AFTER USE.

## 2012-07-26 ENCOUNTER — Other Ambulatory Visit (HOSPITAL_COMMUNITY): Payer: Managed Care, Other (non HMO)

## 2012-07-28 NOTE — Assessment & Plan Note (Signed)
Polyps have not been seen on recent exams and rhinitis has not been the dominant symptom on maintenance prednisone.

## 2012-07-28 NOTE — Progress Notes (Signed)
Documentation for 6 minute walk test 

## 2012-07-28 NOTE — Assessment & Plan Note (Addendum)
Former smoker. We may be seeing progressive evolution of her COPD to explain significant worsening of flow rates since 2009. She has insisted that maintenance prednisone as required to maintain function. By now she probably has adrenal insufficiency as we had discussed repeatedly. Plan-up for pulmonary hypertension with echo, sample Dulera 100 for maintenance, use AeroChamber

## 2012-07-29 ENCOUNTER — Other Ambulatory Visit: Payer: Self-pay

## 2012-08-01 ENCOUNTER — Ambulatory Visit (AMBULATORY_SURGERY_CENTER): Payer: Managed Care, Other (non HMO)

## 2012-08-01 VITALS — Ht 69.0 in | Wt 171.2 lb

## 2012-08-01 DIAGNOSIS — K219 Gastro-esophageal reflux disease without esophagitis: Secondary | ICD-10-CM

## 2012-08-01 DIAGNOSIS — R079 Chest pain, unspecified: Secondary | ICD-10-CM

## 2012-08-02 ENCOUNTER — Encounter: Payer: Self-pay | Admitting: Gastroenterology

## 2012-08-03 ENCOUNTER — Ambulatory Visit (HOSPITAL_COMMUNITY): Payer: Managed Care, Other (non HMO) | Attending: Cardiology

## 2012-08-03 DIAGNOSIS — Z87891 Personal history of nicotine dependence: Secondary | ICD-10-CM | POA: Insufficient documentation

## 2012-08-03 DIAGNOSIS — R0989 Other specified symptoms and signs involving the circulatory and respiratory systems: Secondary | ICD-10-CM | POA: Insufficient documentation

## 2012-08-03 DIAGNOSIS — J4489 Other specified chronic obstructive pulmonary disease: Secondary | ICD-10-CM | POA: Insufficient documentation

## 2012-08-03 DIAGNOSIS — J449 Chronic obstructive pulmonary disease, unspecified: Secondary | ICD-10-CM | POA: Insufficient documentation

## 2012-08-03 DIAGNOSIS — R0609 Other forms of dyspnea: Secondary | ICD-10-CM

## 2012-08-03 DIAGNOSIS — R0600 Dyspnea, unspecified: Secondary | ICD-10-CM

## 2012-08-03 NOTE — Progress Notes (Signed)
Echocardiogram performed.  

## 2012-08-08 ENCOUNTER — Ambulatory Visit (AMBULATORY_SURGERY_CENTER): Payer: Managed Care, Other (non HMO) | Admitting: Gastroenterology

## 2012-08-08 ENCOUNTER — Encounter: Payer: Self-pay | Admitting: Gastroenterology

## 2012-08-08 VITALS — BP 126/73 | HR 78 | Temp 97.9°F | Resp 19 | Ht 69.0 in | Wt 171.0 lb

## 2012-08-08 DIAGNOSIS — K219 Gastro-esophageal reflux disease without esophagitis: Secondary | ICD-10-CM

## 2012-08-08 DIAGNOSIS — R079 Chest pain, unspecified: Secondary | ICD-10-CM

## 2012-08-08 MED ORDER — SODIUM CHLORIDE 0.9 % IV SOLN: 500.0000 mL | INTRAVENOUS | Status: DC

## 2012-08-08 MED ORDER — PANTOPRAZOLE SODIUM 40 MG PO TBEC: 40.0000 mg | DELAYED_RELEASE_TABLET | Freq: Two times a day (BID) | ORAL | Status: DC

## 2012-08-08 NOTE — Op Note (Signed)
[facility] [address] [place], [address]   ENDOSCOPY PROCEDURE REPORT  PATIENT: [name], [name]  MR#: #[id number] BIRTHDATE: April 25, 1951 , 61  yrs. old GENDER: Female ENDOSCOPIST: Meryl Dare, MD, [facility] PROCEDURE DATE:  08/08/2012 PROCEDURE:  EGD, diagnostic ASA CLASS:     Class II INDICATIONS:  Chest pain.   History of esophageal reflux. MEDICATIONS: MAC sedation, administered by CRNA and propofol (Diprivan) 200mg  IV TOPICAL ANESTHETIC: Cetacaine Spray DESCRIPTION OF PROCEDURE: After the risks benefits and alternatives of the procedure were thoroughly explained, informed consent was obtained.  The LB GIF-H180 [id number] endoscope was introduced through the mouth and advanced to the second portion of the duodenum without limitations.  The instrument was slowly withdrawn as the mucosa was fully examined.  ESOPHAGUS: The mucosa of the esophagus appeared normal. STOMACH: The mucosa and folds of the stomach appeared normal. DUODENUM: The duodenal mucosa showed no abnormalities in the bulb and second portion of the duodenum.  Retroflexed views revealed a small hiatal hernia.   The scope was then withdrawn from the patient and the procedure completed.  COMPLICATIONS: There were no complications.  ENDOSCOPIC IMPRESSION: 1.   Small hiatal hernia 2.   The EGD otherwise appeared normal  RECOMMENDATIONS: 1.  Anti-reflux regimen 2.  PPI BID: oantoprazole 40 mg po bid, 1 year of refills 3.  She has GERD that may cause some chest pain, however there are likely other factors contributing to her chest pain    eSigned:  Meryl Dare, MD, [facility] 08/08/2012 2:36 PM

## 2012-08-08 NOTE — Patient Instructions (Addendum)

## 2012-08-08 NOTE — Progress Notes (Signed)
Patient did not experience any of the following events: a burn prior to discharge; a fall within the facility; wrong site/side/patient/procedure/implant event; or a hospital transfer or hospital admission upon discharge from the facility. (G8907) Patient did not have preoperative order for IV antibiotic SSI prophylaxis. (G8918)  

## 2012-08-09 ENCOUNTER — Telehealth: Payer: Self-pay | Admitting: Internal Medicine

## 2012-08-09 ENCOUNTER — Telehealth: Payer: Self-pay

## 2012-08-09 NOTE — Telephone Encounter (Signed)
Last OV 07/20/12. Pt states she was given Dulera sample at last visit and for the first 2 weeks it worked well but since then she has been having increased chest tightness ecspecially in the mornings. She denies any cough, wheezing, or SOB. She states she has been using her albuterol inhaler more as well. Please advise. [name], CMA Allergies  Allergen Reactions  . Aspirin Shortness Of Breath

## 2012-08-09 NOTE — Telephone Encounter (Signed)
I left a message for the pt to call if she had any questions or concern at [phone]. Maw

## 2012-08-09 NOTE — Telephone Encounter (Signed)
Suggest she try a sample of Dulera 200, 2 puffs and rinse well, twice daily.

## 2012-08-10 ENCOUNTER — Ambulatory Visit
Admission: RE | Admit: 2012-08-10 | Discharge: 2012-08-10 | Disposition: A | Discharge status: Home / Self Care | Payer: Managed Care, Other (non HMO) | Source: Ambulatory Visit | Attending: Internal Medicine | Admitting: Internal Medicine

## 2012-08-10 ENCOUNTER — Ambulatory Visit (INDEPENDENT_AMBULATORY_CARE_PROVIDER_SITE_OTHER): Payer: Managed Care, Other (non HMO) | Admitting: Internal Medicine

## 2012-08-10 ENCOUNTER — Other Ambulatory Visit: Payer: Managed Care, Other (non HMO)

## 2012-08-10 ENCOUNTER — Encounter: Payer: Self-pay | Admitting: Internal Medicine

## 2012-08-10 VITALS — BP 120/76 | HR 99 | Ht 69.0 in | Wt 171.8 lb

## 2012-08-10 DIAGNOSIS — R0600 Dyspnea, unspecified: Secondary | ICD-10-CM

## 2012-08-10 DIAGNOSIS — J449 Chronic obstructive pulmonary disease, unspecified: Secondary | ICD-10-CM

## 2012-08-10 DIAGNOSIS — R0609 Other forms of dyspnea: Secondary | ICD-10-CM

## 2012-08-10 MED ORDER — IOHEXOL 350 MG/ML SOLN
125.0000 mL | Freq: Once | INTRAVENOUS | Status: AC | PRN
  Administered 2012-08-10: 125 mL via INTRAVENOUS

## 2012-08-10 NOTE — Patient Instructions (Addendum)
Order- CT chest with contrast     Dyspnea, COPD, question PE  Order- lab  a1AT assay   Order- refer to Cardiology for risk stratification, hx elevated cholesterol, dyspnea with exertion

## 2012-08-10 NOTE — Telephone Encounter (Signed)
The pt is already taking the dulera 200 mg strength Please advise if any other recs thanks! Allergies  Allergen Reactions  . Aspirin Shortness Of Breath

## 2012-08-10 NOTE — Telephone Encounter (Signed)
She has moderate obstructive airways disease by PFT. This may cause her sense of chest tightness. Her endoscopy report indicated she might at times also notice discomfort from reflux. If she gets chest pain with exertion then we will arrange cardiology to see her. Since [facility] hasn't helped much, suggest we send her a nebulizer compressor with albuterol neb solution, # 50, for 1 neb 4 x daily as needed. Dx COPD

## 2012-08-10 NOTE — Progress Notes (Signed)
Subjective:    Patient ID: [name] [name], female    DOB: 01/15/1951, 62 y.o.   MRN: [id number]  HPI 10/14/10- 12 yoF with asthma, allergic rhinitis.  Last here August 31, 2010- Because of problems with GERD and yeast we were hoping to reduce prednisone which she has been on for much of the time since the 1970's. She has maintained mostly on 5 mg daily. We gave 1 mg tabs, but she didn't feel that 4 mg held her.  Heart burn is better and congestion a bit worse. Pollen season usually gets her some congested. She had been on Xolair x 3 years, then wheezed worse and it was stopped.  Today she feels ok, but changes day-to-day. Main trigger for her she thinks is temperature change/ weather. [place] trip - wheezed in [place].   11/13/10- Asthma, allergic rhinitis/ recurrent sinusitis Called last week and we had her take prednisone 10 mg daily x 7 days then go back to 5 mg daily. She says 5 mg isn't enough. Post nasal drip is getting in her lungs and requiring use of rescue inhaler several times daily. Denies dyspnea or headache. Occasional throat clearing of yellow mucus. Little sustained wheeze, just feels tight.  01/11/11- 59 yoF former smoker, followed for asthma, allergic rhinitis/ hx nasal polyps/ aspirin allergy, recurrent sinusitis complicated by GERD Maintenance prednisone was on 5 mg daily, then went up to 40 and now back to 20/day.  Feels fine now, but last week throat tickle progressed into wheeze and she called [place]. [place], [place], basement, carpet. Says there was mold , but she can't smell so she can't tell. Carpet was replaced and visible leak fixed. Also aware of reflux- diet controlled and continues Zegerid.   04/13/11- 59 yoF former smoker, followed for asthma, allergic rhinitis/ hx nasal polyps/ aspirin allergy, recurrent sinusitis complicated by GERD And she declines flu vaccine. She tried Xolair injections for 3 years with no benefit. At last visit we tried tapering  prednisone to maintenance 5 mg daily. She developed increasing nasal congestion and postnasal drainage so she went back up to 10 mg of prednisone daily. With that she feels better but she says that is not enough prednisone to restore her sense of smell. Using rescue inhaler about one time daily for tickle and chest tightness. Daily Water Pik for her nose returns some yellow material. She does not feel that she has a sinus infection. She has not had recent followup with Dr. Erik Obey ENT and was to see him again as needed. He had told her her nasal problems were allergy  .+nasal congestion, post nasal drip,  11/16/11-  36 yoF former smoker, followed for asthma, allergic rhinitis/ hx nasal polyps/ aspirin allergy, recurrent sinusitis complicated by GERD She is past the spring seasonal rhinitis time for her. Had a skin rash diagnosed as "allergic" at an urgent care they increase prednisone and gave cortisone cream but she is now back to her maintenance prednisone 10 mg daily. The rash is better and she feels fairly stable. Denies wheeze or cough. Still no sense of smell even on a prednisone 60 mg taper.  03/16/2012 Acute OV  Presents for an acute office visit.  Called in 10/1 with 3 weeks of cough /wheezing, prednisone taper sent  She returns today feeling some better but still has some drainage w/ tickle in throat and throat clearing often.  Went to UC initially  w/ depo x2, pred pak and zpak.  Denies cough, SOB, tightness in chest. Or edema.  Currently healing from Rigth thumb fx in cast  Nasal sprays not helping.   06/01/12- 60 yoF former smoker, followed for asthma, allergic rhinitis/ hx nasal polyps/ aspirin allergy, recurrent sinusitis complicated by GERD folllows for : pt states still having gerd like sxs since having gallbladder surg 05-05-12.  states has been having some sob,wheezing,.throat clearing.chest tightness. denies any cough ,fever, When she returned to her GERD diet after gallbladder  surgery, she began experiencing tightness in throat and upper chest. This feeling comes and goes and has noticed also on waking. She is continued chronic maintenance prednisone 10 mg daily after repeatedly trying to reduce the dose and feeling that her breathing deteriorated. Rescue inhaler has little effect. Minor throat tickle. Denies coughing. She continues omeprazole and Carafate and does not feel heartburn. She does not distinguish well between reflux and asthma symptoms CXR 05/01/12-reviewed IMPRESSION:  No evidence for acute cardiopulmonary abnormality.  Original Report Authenticated By: Norva Pavlov, M.D.  Office Spirometry 06/01/12- moderate obstructive airways disease. FVC 3.15/85%, FEV1 1.87/65%, FEV1/FVC 0.59% FEF 25-75% 0.92/36% PFT 02/01/08- we compared her old PFT-mild obstructive airways disease with minimal response to bronchodilator. FVC 4.14/107%, FEV1 2.83/98%, FEV1/FVC 0.68, FEF 25-75% 1.72/56%. Scores have declined.  07/20/12- 61 yoF former smoker, followed for asthma, allergic rhinitis/ hx nasal polyps/ aspirin allergy, recurrent sinusitis complicated by GERD FOLLOWS FOR: review and PFT results with patient. She says she is "frustrated" by her shortness of breath, but not coughing. Still on chronic prednisone 10 mg daily. She continues to notice GERD. Albuterol helps chest tightness. She asks primary care referral. PFT 07/20/2012-moderate obstructive airways disease with response to bronchodilator, normal lung volumes, diffusion mildly reduced. FVC 3.87/107%, FEV1 2.37/88%, FEV1/FVC 0.61, FEF 25-75% 1.12/39%. TLC 104%, DLCO 71%. 07/20/2012-98%, 97%, 98%, 444 m. Noted hip pain. No oxygen limitation.  08/10/12- 61 yoF former smoker, followed for asthma, allergic rhinitis/ hx nasal polyps/ aspirin allergy, recurrent sinusitis complicated by GERD FOLLOWS FOR: states Dulera is not working now; was not feeling as much chest tightness with use but now is having increased and  worsening tightness in chest; denies any cough or wheezing. She declined home nebulizer saying she had tried it before with no benefit. Echocardiogram 08/03/2012-EF 55-60%, gr 1 diastolic dysfunction. No pulmonary hypertension or wall motion abnormality.  ROS-see HPI Constitutional:   No-   weight loss, night sweats, fevers, chills, fatigue, lassitude. HEENT:   No-  headaches, difficulty swallowing, tooth/dental problems, sore throat,       No-  sneezing, itching, ear ache, nasal congestion, post nasal drip,  CV:  No-   chest pain, orthopnea, PND, swelling in lower extremities, anasarca, dizziness, palpitations Resp: + shortness of breath with exertion or at rest.              No-   productive cough,  No non-productive cough,  No- coughing up of blood.              No-   change in color of mucus.  No- wheezing.   Skin: No-   rash or lesions. GI:  + heartburn, indigestion, no-abdominal pain, nausea, vomiting,  GU:  MS:  No-   joint pain or swelling.   Neuro-     nothing unusual Psych:  No- change in mood or affect. No depression or anxiety.  No memory loss.  Objective:  OBJ- Physical Exam General- Alert, Oriented, Affect-appropriate, Distress- none acute Skin- rash-none, lesions- none, excoriation-  none Lymphadenopathy- none Head- atraumatic            Eyes- Gross vision intact, PERRLA, conjunctivae and secretions clear            Ears- Hearing, canals-normal            Nose- Clear, no-Septal dev, mucus, polyps, erosion, perforation             Throat- Mallampati II , mucosa clear , drainage- none, tonsils- atrophic Neck- flexible , trachea midline, no stridor , thyroid nl, carotid no bruit Chest - symmetrical excursion , unlabored           Heart/CV- RRR , no murmur , no gallop  , no rub, nl s1 s2, P2 not increased.                           - JVD+1 cm R>L , edema- none, stasis changes- none, varices- none           Lung- clear to P&A, wheeze- none, cough- none , dullness-none, rub-  none           Chest wall-  Abd-  Br/ Gen/ Rectal- Not done, not indicated Extrem- cyanosis- none, clubbing, none, atrophy- none, strength- nl Neuro- grossly intact to observation

## 2012-08-10 NOTE — Telephone Encounter (Signed)
Spoke with pt and notified of recs per CDY She verbalized understanding She states that she does not get CP with exertion She states that the albuterol causes panic attacks and does not want to use neb txs I added this to her allergy list and have scheduled her appt with CDY for eval since this is her 4th phone call regarding her symptoms since last ov 07/18/12

## 2012-08-11 ENCOUNTER — Telehealth: Payer: Self-pay | Admitting: Internal Medicine

## 2012-08-11 MED ORDER — IPRATROPIUM-ALBUTEROL 20-100 MCG/ACT IN AERS: 1.0000 | INHALATION_SPRAY | Freq: Two times a day (BID) | RESPIRATORY_TRACT | Status: DC

## 2012-08-11 NOTE — Telephone Encounter (Signed)
I spoke with pt and is aware of CDY recs. I have sent RX in. Nothing further was needed

## 2012-08-11 NOTE — Telephone Encounter (Signed)
Called and spoke with pt and she stated that her proair is not working as good as before for her.  Pt stated that her chest has been more tight today.  Wanting recs from CY.  Using the proair more often today due to the chest tightness.  Please advise. Thanks  Last ov 08/10/12 Next ov  09/12/2012   Allergies  Allergen Reactions  . Aspirin Shortness Of Breath  . Albuterol Palpitations

## 2012-08-11 NOTE — Telephone Encounter (Signed)
Per CY-suggest Rx for Combivent 1 puff BID-will need the pharmacy to instruct patient on how to use inhaler.

## 2012-08-12 NOTE — Assessment & Plan Note (Signed)
Her complaint of vague substernal tightness with some dyspnea seems disproportionate to objective findings. Plan-alpha-1 antitrypsin assay, CT scan of chest, cardiology referral for risk stratification. Consider referral to pulmonary rehabilitation, probably in [place] where she lives.

## 2012-08-14 ENCOUNTER — Telehealth: Payer: Self-pay | Admitting: Internal Medicine

## 2012-08-14 NOTE — Telephone Encounter (Signed)
Per CY: (1) saline gargle for throat - may have been pollen (2) don't use rescue inhaler in-between the combivent b/c it has albuterol in it.  combivent can be used 4x daily.  Thanks.

## 2012-08-14 NOTE — Telephone Encounter (Signed)
Pt stated that she started with the combivent and she did not know if she should still use the rescue inhaler in between her doses of the combivent. She also stated that she worked out in the yard Saturday and her throat started to get scratchy and has been that way since.  Wanting recs on this as well.  CY please advise. Thanks  Last ov--08/10/2012 Next ov--09/12/2012   Allergies  Allergen Reactions  . Aspirin Shortness Of Breath  . Albuterol Palpitations

## 2012-08-14 NOTE — Progress Notes (Signed)
Quick Note:  LMTCB ______ 

## 2012-08-14 NOTE — Progress Notes (Signed)
Quick Note:  LMTCB 08-14-12 ______

## 2012-08-15 NOTE — Telephone Encounter (Signed)
lmomtcb on pt's named VM 

## 2012-08-15 NOTE — Telephone Encounter (Signed)
Patient returning call.

## 2012-08-15 NOTE — Telephone Encounter (Signed)
Pt advised of recs.Jennifer Castillo, CMA  

## 2012-08-21 ENCOUNTER — Telehealth: Payer: Self-pay | Admitting: Internal Medicine

## 2012-08-21 MED ORDER — MOMETASONE FURO-FORMOTEROL FUM 200-5 MCG/ACT IN AERO: 2.0000 | INHALATION_SPRAY | Freq: Two times a day (BID) | RESPIRATORY_TRACT | Status: DC

## 2012-08-21 NOTE — Telephone Encounter (Signed)
Spoke with pt She states almost out of dulera, feels like this has helped some, so needs rx I have sent this to her pharm She then states needs something to try for "tickle in her throat", started 2 wks ago with frequent need to clear throat I rec that she try chlortrimeton at hs to help and she declined, stating that her ENT advised her not to take antihistamines I then rec that she sip water to help, ice chips or hard candy to help She does not want to try this either Please advise thanks! Allergies  Allergen Reactions  . Aspirin Shortness Of Breath  . Albuterol Palpitations   Last 08/10/12

## 2012-08-21 NOTE — Telephone Encounter (Signed)
I spoke with pt and is aware of CDY recs. She stated she has tried all this and stated she can't drink tea. Per pt she will contact her ENT for recs as well. Nothing further was needed

## 2012-08-21 NOTE — Telephone Encounter (Signed)
Per CY-have patient try diabetic/sugarless candies with Xylitol or sips of tea will work best.

## 2012-08-22 ENCOUNTER — Telehealth: Payer: Self-pay | Admitting: Internal Medicine

## 2012-08-22 NOTE — Telephone Encounter (Signed)
Spoke with patient, requesting patient asst papers for Dulera and Combivent respimat. Papers have been placed upfront, patient aware and nothing further needed at this time.

## 2012-08-24 ENCOUNTER — Ambulatory Visit: Payer: Managed Care, Other (non HMO) | Admitting: Internal Medicine

## 2012-08-31 ENCOUNTER — Telehealth: Payer: Self-pay | Admitting: Gastroenterology

## 2012-08-31 DIAGNOSIS — K219 Gastro-esophageal reflux disease without esophagitis: Secondary | ICD-10-CM

## 2012-08-31 DIAGNOSIS — R079 Chest pain, unspecified: Secondary | ICD-10-CM

## 2012-08-31 MED ORDER — PANTOPRAZOLE SODIUM 40 MG PO TBEC: 40.0000 mg | DELAYED_RELEASE_TABLET | Freq: Two times a day (BID) | ORAL | Status: DC

## 2012-08-31 NOTE — Telephone Encounter (Signed)
Rx was sent be [facility] staff to pharmacy for Protonix twice daily #31 per month in error. Corrected Rx for Protonix twice daily #60 with 10 refills has now been sent to the pharmacy. Left voicemail advising patient.

## 2012-09-01 ENCOUNTER — Ambulatory Visit: Payer: Managed Care, Other (non HMO)

## 2012-09-01 ENCOUNTER — Ambulatory Visit (INDEPENDENT_AMBULATORY_CARE_PROVIDER_SITE_OTHER): Payer: Managed Care, Other (non HMO) | Admitting: Internal Medicine

## 2012-09-01 ENCOUNTER — Encounter: Payer: Self-pay | Admitting: Internal Medicine

## 2012-09-01 VITALS — BP 120/84 | HR 92 | Temp 98.0°F | Resp 12 | Ht 69.0 in | Wt 170.0 lb

## 2012-09-01 DIAGNOSIS — M810 Age-related osteoporosis without current pathological fracture: Secondary | ICD-10-CM

## 2012-09-01 DIAGNOSIS — E785 Hyperlipidemia, unspecified: Secondary | ICD-10-CM | POA: Insufficient documentation

## 2012-09-01 DIAGNOSIS — Z23 Encounter for immunization: Secondary | ICD-10-CM

## 2012-09-01 DIAGNOSIS — F341 Dysthymic disorder: Secondary | ICD-10-CM

## 2012-09-01 DIAGNOSIS — K219 Gastro-esophageal reflux disease without esophagitis: Secondary | ICD-10-CM

## 2012-09-01 DIAGNOSIS — F418 Other specified anxiety disorders: Secondary | ICD-10-CM

## 2012-09-01 DIAGNOSIS — J449 Chronic obstructive pulmonary disease, unspecified: Secondary | ICD-10-CM

## 2012-09-01 LAB — CBC WITH DIFFERENTIAL/PLATELET
Eosinophils Relative: 3 % (ref 0–5)
HCT: 36.8 % (ref 36.0–46.0)
Hemoglobin: 12 g/dL (ref 12.0–15.0)
Lymphocytes Relative: 31 % (ref 12–46)
Lymphs Abs: 2.3 10*3/uL (ref 0.7–4.0)
MCV: 81.2 fL (ref 78.0–100.0)
Monocytes Absolute: 0.5 10*3/uL (ref 0.1–1.0)
RBC: 4.53 MIL/uL (ref 3.87–5.11)
WBC: 7.3 10*3/uL (ref 4.0–10.5)

## 2012-09-01 LAB — COMPREHENSIVE METABOLIC PANEL
Albumin: 3.8 g/dL (ref 3.5–5.2)
Alkaline Phosphatase: 76 U/L (ref 39–117)
BUN: 14 mg/dL (ref 6–23)
Calcium: 9.2 mg/dL (ref 8.4–10.5)
Glucose, Bld: 94 mg/dL (ref 70–99)
Potassium: 4 mEq/L (ref 3.5–5.1)

## 2012-09-01 LAB — LIPID PANEL
Cholesterol: 228 mg/dL — ABNORMAL HIGH (ref 0–200)
Total CHOL/HDL Ratio: 4
Triglycerides: 167 mg/dL — ABNORMAL HIGH (ref 0.0–149.0)

## 2012-09-01 MED ORDER — SERTRALINE HCL 50 MG PO TABS
50.0000 mg | ORAL_TABLET | Freq: Every day | ORAL | Status: DC
Start: 2012-09-01 — End: 2012-09-12

## 2012-09-01 NOTE — Assessment & Plan Note (Signed)
-   Start sertraline 

## 2012-09-01 NOTE — Assessment & Plan Note (Signed)
She is due for a repeat DEXA scan I will check her Vit D level today as well as renal function, Ca++

## 2012-09-01 NOTE — Progress Notes (Signed)
Subjective:    Patient ID: [name] [name], female    DOB: [birth year], 62 y.o.   MRN: [id number]  Anxiety Presents for initial visit. Onset was 1 to 6 months ago. The problem has been gradually worsening. Symptoms include depressed mood, excessive worry, irritability, nervous/anxious behavior and panic. Patient reports no chest pain, compulsions, confusion, decreased concentration, dizziness, dry mouth, feeling of choking, hyperventilation, impotence, insomnia, malaise, muscle tension, nausea, obsessions, palpitations, restlessness, shortness of breath or suicidal ideas. Symptoms occur occasionally. The severity of symptoms is mild. Nothing aggravates the symptoms.   Risk factors include recent illness and history of steroid use. Her past medical history is significant for depression. Past treatments include nothing.      Review of Systems  Constitutional: Positive for irritability. Negative for fever, chills, diaphoresis, activity change, appetite change, fatigue and unexpected weight change.  HENT: Negative.   Eyes: Negative.   Respiratory: Negative.  Negative for apnea, cough, choking, chest tightness, shortness of breath and wheezing.   Cardiovascular: Negative for chest pain, palpitations and leg swelling.  Gastrointestinal: Negative for nausea, vomiting, abdominal pain, diarrhea, constipation and blood in stool.  Endocrine: Negative.   Genitourinary: Negative.  Negative for impotence.  Musculoskeletal: Negative for myalgias, back pain, joint swelling, arthralgias and gait problem.  Skin: Negative for color change, pallor, rash and wound.  Allergic/Immunologic: Negative.   Neurological: Negative.  Negative for dizziness, tremors, weakness, light-headedness and numbness.  Hematological: Negative for adenopathy. Does not bruise/bleed easily.  Psychiatric/Behavioral: Negative for suicidal ideas, hallucinations, behavioral problems, confusion, sleep disturbance, self-injury, dysphoric  mood, decreased concentration and agitation. The patient is nervous/anxious. The patient does not have insomnia and is not hyperactive.        Objective:   Physical Exam  Vitals reviewed. Constitutional: She is oriented to person, place, and time. She appears well-developed and well-nourished. No distress.  HENT:  Head: Normocephalic and atraumatic.  Mouth/Throat: Oropharynx is clear and moist. No oropharyngeal exudate.  Eyes: Conjunctivae are normal. Right eye exhibits no discharge. Left eye exhibits no discharge. No scleral icterus.  Neck: Normal range of motion. Neck supple. No JVD present. No tracheal deviation present. No thyromegaly present.  Cardiovascular: Normal rate, regular rhythm, normal heart sounds and intact distal pulses.  Exam reveals no gallop and no friction rub.   No murmur heard. Pulmonary/Chest: Effort normal and breath sounds normal. No stridor. No respiratory distress. She has no wheezes. She has no rales. She exhibits no tenderness.  Abdominal: Soft. Bowel sounds are normal. She exhibits no distension and no mass. There is no tenderness. There is no rebound and no guarding.  Musculoskeletal: Normal range of motion. She exhibits no edema and no tenderness.  Lymphadenopathy:    She has no cervical adenopathy.  Neurological: She is oriented to person, place, and time.  Skin: Skin is warm and dry. No rash noted. She is not diaphoretic. No erythema. No pallor.  Psychiatric: Her behavior is normal. Judgment and thought content normal. Her mood appears anxious. Her affect is not angry, not blunt, not labile and not inappropriate. Her speech is not rapid and/or pressured, not delayed, not tangential and not slurred. Cognition and memory are normal. She does not exhibit a depressed mood. She is communicative.     Lab Results  Component Value Date   WBC 8.0 05/01/2012   HGB 12.1 05/01/2012   HCT 36.5 05/01/2012   PLT 247 05/01/2012   GLUCOSE 91 05/01/2012   ALT 12  05/01/2012   AST 17  05/01/2012   NA 140 05/01/2012   K 3.9 05/01/2012   CL 104 05/01/2012   CREATININE 0.92 05/01/2012   BUN 16 05/01/2012   CO2 29 05/01/2012       Assessment & Plan:

## 2012-09-01 NOTE — Assessment & Plan Note (Signed)
FLP THS CMP today

## 2012-09-01 NOTE — Patient Instructions (Signed)
Osteoporosis  Throughout your life, your body breaks down old bone and replaces it with new bone. As you get older, your body does not replace bone as quickly as it breaks it down. By the age of 30 years, most people begin to gradually lose bone because of the imbalance between bone loss and replacement. Some people lose more bone than others. Bone loss beyond a specified normal degree is considered osteoporosis.    Osteoporosis affects the strength and durability of your bones. The inside of the ends of your bones and your flat bones, like the bones of your pelvis, look like honeycomb, filled with tiny open spaces. As bone loss occurs, your bones become less dense. This means that the open spaces inside your bones become bigger and the walls between these spaces become thinner. This makes your bones weaker. Bones of a person with osteoporosis can become so weak that they can break (fracture) during minor accidents, such as a simple fall.  CAUSES    The following factors have been associated with the development of osteoporosis:   Smoking.   Drinking more than 2 alcoholic drinks several days per week.   Long-term use of certain medicines:   Corticosteroids.   Chemotherapy medicines.   Thyroid medicines.   Antiepileptic medicines.   Gonadal hormone suppression medicine.   Immunosuppression medicine.   Being underweight.   Lack of physical activity.   Lack of exposure to the sun. This can lead to vitamin D deficiency.   Certain medical conditions:   Certain inflammatory bowel diseases, such as Crohn's disease and ulcerative colitis.   Diabetes.   Hyperthyroidism.   Hyperparathyroidism.  RISK FACTORS  Anyone can develop osteoporosis. However, the following factors can increase your risk of developing osteoporosis:   Gender Women are at higher risk than men.   Age Being older than 50 years increases your risk.   Ethnicity White and Asian people have an increased risk.    Weight Being extremely underweight can increase your risk of osteoporosis.   Family history of osteoporosis Having a family member who has developed osteoporosis can increase your risk.  SYMPTOMS    Usually, people with osteoporosis have no symptoms.    DIAGNOSIS    Signs during a physical exam that may prompt your caregiver to suspect osteoporosis include:   Decreased height. This is usually caused by the compression of the bones that form your spine (vertebrae) because they have weakened and become fractured.   A curving or rounding of the upper back (kyphosis).  To confirm signs of osteoporosis, your caregiver may request a procedure that uses 2 low-dose X-ray beams with different levels of energy to measure your bone mineral density (dual-energy X-ray absorptiometry [DXA]). Also, your caregiver may check your level of vitamin D.  TREATMENT    The goal of osteoporosis treatment is to strengthen bones in order to decrease the risk of bone fractures. There are different types of medicines available to help achieve this goal. Some of these medicines work by slowing the processes of bone loss. Some medicines work by increasing bone density. Treatment also involves making sure that your levels of calcium and vitamin D are adequate.  PREVENTION    There are things you can do to help prevent osteoporosis. Adequate intake of calcium and vitamin D can help you achieve optimal bone mineral density. Regular exercise can also help, especially resistance and high-impact activities. If you smoke, quitting smoking is an important part of osteoporosis prevention.  MAKE 

## 2012-09-05 ENCOUNTER — Encounter: Payer: Self-pay | Admitting: Internal Medicine

## 2012-09-05 ENCOUNTER — Telehealth: Payer: Self-pay | Admitting: Internal Medicine

## 2012-09-05 ENCOUNTER — Institutional Professional Consult (permissible substitution): Payer: Managed Care, Other (non HMO) | Admitting: Cardiology

## 2012-09-05 NOTE — Telephone Encounter (Signed)
If she feels bad she should stop taking it

## 2012-09-05 NOTE — Telephone Encounter (Signed)
No. No alternatives for now, will see how she does

## 2012-09-05 NOTE — Telephone Encounter (Signed)
The patient would like a call back from [facility]

## 2012-09-05 NOTE — Telephone Encounter (Signed)
Patient Information:  Caller Name: Neisha  Phone: [phone]  Patient: [name] [name], [name] [name]  Gender: Female  DOB: 12/02/1950  Age: 62 Years  PCP: Oliver Barre (Adults only)  Office Follow Up:  Does the office need to follow up with this patient?: Yes  Instructions For The Office: please advsie - should pt contine med and monitor? or do any adjustments need to be made   Symptoms  Reason For Call & Symptoms: Pt was put on Zoloft last week. Pt was advised to call back with any side effects. Pt is calling to notify MD that her heart rate raced off and on yesterday. Onset in the am/finished by the end of her work day. Pt states that it lasted up to 20 min at a time. Other than that she has no idea the intervals in between. Pt did not measure it. She has had it happen a little today/not as much - lasting a min or two/not as fast. She still hasn't measured it. RN walked pt through checking her heart rate. Pt counted 64 regular beats per minute at this moment. There are no other sx. Office note sent to MD  Reviewed Health History In EMR: Yes  Reviewed Medications In EMR: Yes  Reviewed Allergies In EMR: Yes  Reviewed Surgeries / Procedures: Yes  Date of Onset of Symptoms: 09/04/2012  Guideline(s) Used:  Heart Rate and Heartbeat Questions  Disposition Per Guideline:   See Within 3 Days in Office  Reason For Disposition Reached:   Palpitations and no improvement after following Care Advice  Advice Given:  N/A  RN Overrode Recommendation:  Follow Up With Office Later  Pt started on new medication. Please advise if this is expected side effect or what pt needs to do

## 2012-09-05 NOTE — Telephone Encounter (Signed)
Please advise if any alternatives. Thanks

## 2012-09-06 ENCOUNTER — Encounter: Payer: Self-pay | Admitting: Internal Medicine

## 2012-09-06 NOTE — Telephone Encounter (Signed)
Patient notified per MD, she states that she did not take it yesterday and feels much better. Pt thinks that maybe medication was just too strong. She will give it awhile and call back next week or so to see what else she can take or if there is a lower dose available.

## 2012-09-07 ENCOUNTER — Telehealth: Payer: Self-pay | Admitting: Internal Medicine

## 2012-09-07 NOTE — Telephone Encounter (Signed)
I spoke with pt. She stated she scheduled her appt with cardiology and it is not until 10/18/12. Is this soon enough. If not then she needs [place] to call to get her sooner appt. Please advise Dr. Maple Hudson thanks

## 2012-09-07 NOTE — Telephone Encounter (Signed)
Per CY-that's soon enough unless there is a change.

## 2012-09-07 NOTE — Telephone Encounter (Signed)
lmtcb x1 for pt. 

## 2012-09-08 ENCOUNTER — Telehealth: Payer: Self-pay | Admitting: Internal Medicine

## 2012-09-08 NOTE — Telephone Encounter (Signed)
Per CY- Did she ever get a new PCP? Offer trial of librax 1 every 8 hours as needed to see if relaxing esophageal spasm will help #20 no refills.   I spoke with the pt ans she states her PCP is Dr. Sanda Linger. She also states that she has some of this medication and she will try it when she has the chest pain. Rx was not sent.  [name], CMA

## 2012-09-08 NOTE — Telephone Encounter (Signed)
I spoke with pt. She stated she woke up with a "pain in her chest". She stated last time she went to the ED and they found nothing. The pain is coming and going all morning. 2/10 on pain scale. She is using her inhalers. Denies any other symptoms. She is requesting further recs. Please advise Dr. Maple Hudson thanks Last OV 08/10/12 Pending 09/12/12 Allergies  Allergen Reactions  . Aspirin Shortness Of Breath  . Albuterol Palpitations

## 2012-09-08 NOTE — Telephone Encounter (Signed)
Pt advised. [name] [name], CMA  

## 2012-09-12 ENCOUNTER — Encounter: Payer: Self-pay | Admitting: Internal Medicine

## 2012-09-12 ENCOUNTER — Ambulatory Visit (INDEPENDENT_AMBULATORY_CARE_PROVIDER_SITE_OTHER): Payer: Managed Care, Other (non HMO) | Admitting: Internal Medicine

## 2012-09-12 VITALS — BP 124/72 | HR 92 | Ht 69.0 in | Wt 170.8 lb

## 2012-09-12 DIAGNOSIS — F418 Other specified anxiety disorders: Secondary | ICD-10-CM

## 2012-09-12 DIAGNOSIS — J449 Chronic obstructive pulmonary disease, unspecified: Secondary | ICD-10-CM

## 2012-09-12 DIAGNOSIS — F341 Dysthymic disorder: Secondary | ICD-10-CM

## 2012-09-12 MED ORDER — CILIDINIUM-CHLORDIAZEPOXIDE 2.5-5 MG PO CAPS: 1.0000 | ORAL_CAPSULE | Freq: Three times a day (TID) | ORAL | Status: DC | PRN

## 2012-09-12 NOTE — Patient Instructions (Addendum)
See if patient assistance available for Combivent  Ok to try off mucinex to see if you have less throat clearing without it  Script for librax to ease the tight feeling in your chest

## 2012-09-12 NOTE — Progress Notes (Signed)
Subjective:    Patient ID: [name] [name], female    DOB: 01/15/1951, 62 y.o.   MRN: [id number]  HPI 10/14/10- 12 yoF with asthma, allergic rhinitis.  Last here August 31, 2010- Because of problems with GERD and yeast we were hoping to reduce prednisone which she has been on for much of the time since the 1970's. She has maintained mostly on 5 mg daily. We gave 1 mg tabs, but she didn't feel that 4 mg held her.  Heart burn is better and congestion a bit worse. Pollen season usually gets her some congested. She had been on Xolair x 3 years, then wheezed worse and it was stopped.  Today she feels ok, but changes day-to-day. Main trigger for her she thinks is temperature change/ weather. [place] trip - wheezed in [place].   11/13/10- Asthma, allergic rhinitis/ recurrent sinusitis Called last week and we had her take prednisone 10 mg daily x 7 days then go back to 5 mg daily. She says 5 mg isn't enough. Post nasal drip is getting in her lungs and requiring use of rescue inhaler several times daily. Denies dyspnea or headache. Occasional throat clearing of yellow mucus. Little sustained wheeze, just feels tight.  01/11/11- 62 yoF former smoker, followed for asthma, allergic rhinitis/ hx nasal polyps/ aspirin allergy, recurrent sinusitis complicated by GERD Maintenance prednisone was on 5 mg daily, then went up to 40 and now back to 20/day.  Feels fine now, but last week throat tickle progressed into wheeze and she called [place]. [place] home[place], basement, carpet. Says there was mold , but she can't smell so she can't tell. Carpet was replaced and visible leak fixed. Also aware of reflux- diet controlled and continues Zegerid.   04/13/11- 62 yoF former smoker, followed for asthma, allergic rhinitis/ hx nasal polyps/ aspirin allergy, recurrent sinusitis complicated by GERD And she declines flu vaccine. She tried Xolair injections for 3 years with no benefit. At last visit we tried tapering  prednisone to maintenance 5 mg daily. She developed increasing nasal congestion and postnasal drainage so she went back up to 10 mg of prednisone daily. With that she feels better but she says that is not enough prednisone to restore her sense of smell. Using rescue inhaler about one time daily for tickle and chest tightness. Daily Water Pik for her nose returns some yellow material. She does not feel that she has a sinus infection. She has not had recent followup with Dr. Erik Obey ENT and was to see him again as needed. He had told her her nasal problems were allergy  .+nasal congestion, post nasal drip,  11/16/11-  62 yoF former smoker, followed for asthma, allergic rhinitis/ hx nasal polyps/ aspirin allergy, recurrent sinusitis complicated by GERD She is past the spring seasonal rhinitis time for her. Had a skin rash diagnosed as "allergic" at an urgent care they increase prednisone and gave cortisone cream but she is now back to her maintenance prednisone 10 mg daily. The rash is better and she feels fairly stable. Denies wheeze or cough. Still no sense of smell even on a prednisone 60 mg taper.  03/16/2012 Acute OV  Presents for an acute office visit.  Called in 10/1 with 3 weeks of cough /wheezing, prednisone taper sent  She returns today feeling some better but still has some drainage w/ tickle in throat and throat clearing often.  Went to UC initially  w/ depo x2, pred pak and zpak.  Denies cough, SOB, tightness in chest. Or edema.  Currently healing from Rigth thumb fx in cast  Nasal sprays not helping.   06/01/12- 62 yoF former smoker, followed for asthma, allergic rhinitis/ hx nasal polyps/ aspirin allergy, recurrent sinusitis complicated by GERD folllows for : pt states still having gerd like sxs since having gallbladder surg 05-05-12.  states has been having some sob,wheezing,.throat clearing.chest tightness. denies any cough ,fever, When she returned to her GERD diet after gallbladder  surgery, she began experiencing tightness in throat and upper chest. This feeling comes and goes and has noticed also on waking. She is continued chronic maintenance prednisone 10 mg daily after repeatedly trying to reduce the dose and feeling that her breathing deteriorated. Rescue inhaler has little effect. Minor throat tickle. Denies coughing. She continues omeprazole and Carafate and does not feel heartburn. She does not distinguish well between reflux and asthma symptoms CXR 05/01/12-reviewed IMPRESSION:  No evidence for acute cardiopulmonary abnormality.  Original Report Authenticated By: Norva Pavlov, M.D.  Office Spirometry 06/01/12- moderate obstructive airways disease. FVC 3.15/85%, FEV1 1.87/65%, FEV1/FVC 0.59% FEF 25-75% 0.92/36% PFT 02/01/08- we compared her old PFT-mild obstructive airways disease with minimal response to bronchodilator. FVC 4.14/107%, FEV1 2.83/98%, FEV1/FVC 0.68, FEF 25-75% 1.72/56%. Scores have declined.  07/20/12- 62 yoF former smoker, followed for asthma, allergic rhinitis/ hx nasal polyps/ aspirin allergy, recurrent sinusitis complicated by GERD FOLLOWS FOR: review and PFT results with patient. She says she is "frustrated" by her shortness of breath, but not coughing. Still on chronic prednisone 10 mg daily. She continues to notice GERD. Albuterol helps chest tightness. She asks primary care referral. PFT 07/20/2012-moderate obstructive airways disease with response to bronchodilator, normal lung volumes, diffusion mildly reduced. FVC 3.87/107%, FEV1 2.37/88%, FEV1/FVC 0.61, FEF 25-75% 1.12/39%. TLC 104%, DLCO 71%. 07/20/2012-98%, 97%, 98%, 444 m. Noted hip pain. No oxygen limitation.  08/10/12- 62 yoF former smoker, followed for asthma, allergic rhinitis/ hx nasal polyps/ aspirin allergy, recurrent sinusitis complicated by GERD FOLLOWS FOR: states Dulera is not working now; was not feeling as much chest tightness with use but now is having increased and  worsening tightness in chest; denies any cough or wheezing. She declined home nebulizer saying she had tried it before with no benefit. Echocardiogram 08/03/2012-EF 55-60%, gr 1 diastolic dysfunction. No pulmonary hypertension or wall motion abnormality.  09/12/12- 62 yoF former smoker, followed for asthma, allergic rhinitis/ hx nasal polyps/ aspirin allergy, recurrent sinusitis complicated by GERD FOLLOWS FOR: still having chest tightness. Denies any wheezing or cough. Has recently noticed slight tickly/clearing throat since being around sick co worker. Admits she has been worried about her respiratory prognosis since diagnosed with COPD. Which she was exposed to a friend with a cold, she began using an antibiotic she had a home and Mucinex. She still feels somewhat tight without pain. She failed Spiriva but continues Dulera and remains on prednisone 10 mg daily maintenance which we discussed again. GI give her Levsin for possible esophageal spasm with minimal benefit. Dr. Yetta Barre treated with Zoloft but it just made her nervous.  ROS-see HPI Constitutional:   No-   weight loss, night sweats, fevers, chills, fatigue, lassitude. HEENT:   No-  headaches, difficulty swallowing, tooth/dental problems, sore throat,       No-  sneezing, itching, ear ache, nasal congestion, post nasal drip,  CV:  No-   chest pain, orthopnea, PND, swelling in lower extremities, anasarca, dizziness, palpitations Resp: + shortness of breath with exertion or at rest.  No-   productive cough,  No non-productive cough,  No- coughing up of blood.              No-   change in color of mucus.  No- wheezing.   Skin: No-   rash or lesions. GI:  + heartburn, indigestion, no-abdominal pain, nausea, vomiting,  GU:  MS:  No-   joint pain or swelling.   Neuro-     nothing unusual Psych:  No- change in mood or affect. No depression, + anxiety.  No memory loss.  Objective:  OBJ- Physical Exam General- Alert, Oriented,  Affect-appropriate, Distress- none acute Skin- rash-none, lesions- none, excoriation- none Lymphadenopathy- none Head- atraumatic            Eyes- Gross vision intact, PERRLA, conjunctivae and secretions clear            Ears- Hearing, canals-normal            Nose- Clear, no-Septal dev, mucus, polyps, erosion, perforation             Throat- Mallampati II , mucosa clear , drainage- none, tonsils- atrophic Neck- flexible , trachea midline, no stridor , thyroid nl, carotid no bruit Chest - symmetrical excursion , unlabored           Heart/CV- RRR , no murmur , no gallop  , no rub, nl s1 s2, P2 not increased.                           - JVD+1 cm R>L , edema- none, stasis changes- none, varices- none           Lung- clear to P&A with good inspiratory effort, wheeze- none, cough- none , dullness-none, rub- none           Chest wall-  Abd-  Br/ Gen/ Rectal- Not done, not indicated  Extrem- cyanosis- none, clubbing, none, atrophy- none, strength- nl Neuro- grossly intact to observation

## 2012-09-18 NOTE — Assessment & Plan Note (Signed)
It looks as if several examiners suspect anxiety component.  Plan-try Librax

## 2012-09-18 NOTE — Assessment & Plan Note (Signed)
Patient assistance for Combivent if available. For the persistent sense of mid chest tightness, try Librax. Okay to stop Mucinex which is not clearly helpful.

## 2012-09-21 ENCOUNTER — Telehealth: Payer: Self-pay | Admitting: Internal Medicine

## 2012-09-21 ENCOUNTER — Ambulatory Visit: Payer: Managed Care, Other (non HMO) | Admitting: Internal Medicine

## 2012-09-21 MED ORDER — MOMETASONE FURO-FORMOTEROL FUM 200-5 MCG/ACT IN AERO: 2.0000 | INHALATION_SPRAY | Freq: Two times a day (BID) | RESPIRATORY_TRACT | Status: DC

## 2012-09-21 NOTE — Telephone Encounter (Signed)
.  spoke with pt needs written rx for dulera to send along with other paper work  to financial assistance  rx printed and placed on CY desk to sign . Please mail rx to pt .

## 2012-09-21 NOTE — Telephone Encounter (Signed)
Mailed to patient

## 2012-10-16 ENCOUNTER — Telehealth: Payer: Self-pay | Admitting: Internal Medicine

## 2012-10-16 MED ORDER — IPRATROPIUM-ALBUTEROL 20-100 MCG/ACT IN AERS: 1.0000 | INHALATION_SPRAY | Freq: Two times a day (BID) | RESPIRATORY_TRACT | Status: DC

## 2012-10-16 NOTE — Telephone Encounter (Signed)
Printed Rx for CY to sign. Pt is aware that once we get this signed, it will be placed in the mail for her.

## 2012-10-18 ENCOUNTER — Institutional Professional Consult (permissible substitution): Payer: Managed Care, Other (non HMO) | Admitting: Cardiology

## 2012-11-13 ENCOUNTER — Telehealth: Payer: Self-pay | Admitting: Internal Medicine

## 2012-11-13 ENCOUNTER — Ambulatory Visit (INDEPENDENT_AMBULATORY_CARE_PROVIDER_SITE_OTHER): Payer: Managed Care, Other (non HMO) | Admitting: Cardiology

## 2012-11-13 ENCOUNTER — Encounter: Payer: Self-pay | Admitting: Cardiology

## 2012-11-13 VITALS — BP 120/75 | HR 88 | Ht 69.0 in | Wt 172.8 lb

## 2012-11-13 DIAGNOSIS — R079 Chest pain, unspecified: Secondary | ICD-10-CM

## 2012-11-13 DIAGNOSIS — E785 Hyperlipidemia, unspecified: Secondary | ICD-10-CM

## 2012-11-13 MED ORDER — CILIDINIUM-CHLORDIAZEPOXIDE 2.5-5 MG PO CAPS: 1.0000 | ORAL_CAPSULE | Freq: Three times a day (TID) | ORAL | Status: DC | PRN

## 2012-11-13 NOTE — Progress Notes (Signed)
HPI The patient presents for evaluation of chest discomfort and fatigue. She was at [facility] ER by her description earlier this year. She had some chest discomfort but she doesn't report being admitted at bedtime. She did have a followup echocardiogram in February. I looked at this result. She had some mild LVH but no significant valvular abnormalities and she had a normal ejection fraction. She has not had any prior cardiac history. She does have a history of COPD. She's somewhat limited by this. She does get chest discomfort. However, she describes this as after eating certain foods and thinks it might be reflux causing her asthma. She describes it as a tightness spicy foods. She does not get this with activities. She does not describe jaw or arm discomfort. She doesn't describe associated symptoms such as nausea vomiting or diaphoresis. She was noted to have ectopy when she had physical exam in the past but she was not feeling as and has never had any palpitations, presyncope or syncope. She was referred for evaluation of chest discomfort. Of note her most strenuous activities walking routinely to her basement.  Allergies  Allergen Reactions  . Aspirin Shortness Of Breath  . Albuterol Palpitations    Current Outpatient Prescriptions  Medication Sig Dispense Refill  . albuterol (PROVENTIL HFA;VENTOLIN HFA) 108 (90 BASE) MCG/ACT inhaler Inhale 2 puffs into the lungs every 6 (six) hours as needed for wheezing.  1 Inhaler  6  . aluminum hydroxide-magnesium carbonate (GAVISCON) 95-358 MG/15ML SUSP Take by mouth 4 (four) times daily as needed.      . cefdinir (OMNICEF) 300 MG capsule Take 300 mg by mouth 2 (two) times daily.      . clidinium-chlordiazePOXIDE (LIBRAX) 2.5-5 MG per capsule Take 1 capsule by mouth 3 (three) times daily as needed.  90 capsule  3  . conjugated estrogens (PREMARIN) vaginal cream Place 0.5 g vaginally. As directed      . hyoscyamine (LEVSIN SL) 0.125 MG SL tablet  Place 0.25 mg under the tongue every 4 (four) hours as needed. For cramping      . Ipratropium-Albuterol (COMBIVENT RESPIMAT) 20-100 MCG/ACT AERS respimat Inhale 1 puff into the lungs 2 (two) times daily.  1 Inhaler  11  . mometasone-formoterol (DULERA) 200-5 MCG/ACT AERO Inhale 2 puffs into the lungs 2 (two) times daily.  1 Inhaler  12  . pantoprazole (PROTONIX) 40 MG tablet Take 1 tablet (40 mg total) by mouth 2 (two) times daily.  60 tablet  10  . predniSONE (DELTASONE) 10 MG tablet Take 1 tablet (10 mg total) by mouth daily.  30 tablet  0  . ranitidine (ZANTAC) 150 MG tablet Take 1 tablet (150 mg total) by mouth at bedtime.  30 tablet  2  . Spacer/Aero-Holding Chambers (AEROCHAMBER MV) inhaler Use as instructed  1 each  0   No current facility-administered medications for this visit.    Past Medical History  Diagnosis Date  . Polyp of nasal cavity   . Extrinsic asthma, unspecified   . Allergic rhinitis, cause unspecified     failed allergy vaccine  . Unspecified sinusitis (chronic)   . 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 S/P dilatation of esophageal stricture 08-2008  . Hiatal hernia   . Diverticulosis   . PONV (postoperative nausea and vomiting)   . Anxiety     panic attacks  . GERD (gastroesophageal reflux disease)   . Arthritis   . Depression     Past Surgical History  Procedure Laterality Date  . Nasal polyp surgery      x 3   . Tonsillectomy    . Bunionectomy      left foot  . Colonoscopy w/ biopsies and polypectomy    . Thumb fusion  10/13    rt hand  pinned but pin removed  . Cholecystectomy  05/05/2012    Procedure: LAPAROSCOPIC CHOLECYSTECTOMY WITH INTRAOPERATIVE CHOLANGIOGRAM;  Surgeon: Atilano Ina, MD,FACS;  Location: [facility] OR;  Service: General;  Laterality: N/A;  . Polypectomy    . Colonoscopy      Family History  Problem Relation Age of Onset  . Colon cancer Maternal Grandfather     unsure age of onset   . Cancer Maternal Grandfather      colon  . Stroke Maternal Aunt   . Heart disease Father   . Prostate cancer Brother     History   Social History  . Marital Status: Married    Spouse Name: N/A    Number of Children: 2  . Years of Education: N/A   Occupational History  . Art gallery manager   .     Social History Main Topics  . Smoking status: Former Smoker -- 0.50 packs/day for 0 years    Types: Cigarettes    Quit date: 06/14/1988  . Smokeless tobacco: Never Used  . Alcohol Use: No  . Drug Use: No  . Sexually Active: Yes    Birth Control/ Protection: Post-menopausal   Other Topics Concern  . Not on file   Social History Narrative  . No narrative on file    ROS:  As stated in the HPI and negative for all other systems.  PHYSICAL EXAM BP 120/75  Pulse 88  Ht 5\' 9"  (1.753 m)  Wt 172 lb 12.8 oz (78.382 kg)  BMI 25.51 kg/m2 GENERAL:  Well appearing HEENT:  Pupils equal round and reactive, fundi not visualized, oral mucosa unremarkable NECK:  No jugular venous distention, waveform within normal limits, carotid upstroke brisk and symmetric, no bruits, no thyromegaly LYMPHATICS:  No cervical, inguinal adenopathy LUNGS:  Clear to auscultation bilaterally BACK:  No CVA tenderness CHEST:  Unremarkable HEART:  PMI not displaced or sustained,S1 and S2 within normal limits, no S3, no S4, no clicks, no rubs, no murmurs ABD:  Flat, positive bowel sounds normal in frequency in pitch, no bruits, no rebound, no guarding, no midline pulsatile mass, no hepatomegaly, no splenomegaly EXT:  2 plus pulses throughout, no edema, no cyanosis no clubbing SKIN:  No rashes no nodules NEURO:  Cranial nerves II through XII grossly intact, motor grossly intact throughout PSYCH:  Cognitively intact, oriented to person place and time  EKG:  Sinus rhythm, rate 88, axis within normal limits, intervals within normal limits, no acute ST-T wave changes.   ASSESSMENT AND PLAN  CHEST PAIN:  This is atypical.  I will bring the  patient back for a POET (Plain Old Exercise Test). This will allow me to screen for obstructive coronary disease, risk stratify and very importantly provide a prescription for exercise.  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  However, she has no symptoms with this. No change in therapy is indicated.

## 2012-11-13 NOTE — Patient Instructions (Addendum)
The current medical regimen is effective;  continue present plan and medications.  Your physician has requested that you have an exercise tolerance test. For further information please visit [email or web address]. Please also follow instruction sheet, as given.   

## 2012-11-13 NOTE — Telephone Encounter (Signed)
I spoke with pt. She is requesting a refill on librax. Last refilled 09/12/12 #30 x 3 refills TID PRN Per pt she is having to take this TID everyday. She is requesting for RX to be called in for #90. Please advise if okay to do so Dr. Maple Hudson thanks Last OV 09/12/12 Pending 12/12/12

## 2012-11-13 NOTE — Telephone Encounter (Signed)
Per CY-okay to refill #90 as requested.

## 2012-11-13 NOTE — Telephone Encounter (Signed)
Spoke with patient, patient aware Rx will be sent in for Librax as requested to verified pharmacy. Nothing further needed at this time

## 2012-12-04 ENCOUNTER — Telehealth: Payer: Self-pay | Admitting: Internal Medicine

## 2012-12-04 ENCOUNTER — Institutional Professional Consult (permissible substitution): Payer: Managed Care, Other (non HMO) | Admitting: Cardiology

## 2012-12-04 NOTE — Telephone Encounter (Signed)
LMTCB

## 2012-12-05 ENCOUNTER — Encounter: Payer: Managed Care, Other (non HMO) | Admitting: Physician Assistant

## 2012-12-05 MED ORDER — FIRST-DUKES MOUTHWASH MT SUSP: OROMUCOSAL | Status: DC

## 2012-12-05 NOTE — Telephone Encounter (Signed)
Pt has noticed that her tongue looks and feels raw and burns when eating pepper.  Pt states that she rinses her mouth out with water after using Dulera.  Please advise if there is anything else she can do.

## 2012-12-05 NOTE — Telephone Encounter (Signed)
Spoke to pt. She is aware of CY recommendation. Rx has been sent in to [facility] per her request. Nothing further was needed.

## 2012-12-05 NOTE — Telephone Encounter (Signed)
Pt returned triage's call & can be reached at [phone].  [name] [name]

## 2012-12-05 NOTE — Telephone Encounter (Signed)
Pt was not given MMW .  Was only told to rinse mouth after every use.  I clarified this with pt.

## 2012-12-05 NOTE — Telephone Encounter (Signed)
Per CY---   Try dukes magic mouthwash  150 ml  1 tsp  Swish and swallow four times daily

## 2012-12-05 NOTE — Telephone Encounter (Signed)
Per CY---  Was she given MMW to rinse her mouth after each use of the inhaler?

## 2012-12-12 ENCOUNTER — Ambulatory Visit: Payer: Managed Care, Other (non HMO) | Admitting: Internal Medicine

## 2012-12-14 ENCOUNTER — Telehealth: Payer: Self-pay | Admitting: Internal Medicine

## 2012-12-14 MED ORDER — PREDNISONE 10 MG PO TABS: ORAL_TABLET | ORAL | Status: DC

## 2012-12-14 NOTE — Telephone Encounter (Signed)
Ok to give prednisone 10 mg, # 20, 4 X 2 DAYS, 3 X 2 DAYS, 2 X 2 DAYS, 1 X 2 DAYS

## 2012-12-14 NOTE — Telephone Encounter (Signed)
Called, spoke with pt.  Informed her of below per Dr. Maple Hudson.  She verbalized understanding, is aware [facility], and is to call back if symptoms do not improve or worsen.

## 2012-12-14 NOTE — Telephone Encounter (Signed)
Called, spoke with pt.   C/o runny nose and blowing yellow mucus from nose.  Had a little increased SOB last night.  Symptoms stated x 5 days ago.  No sinus pressure, pain, PND< cough, wheezing, or f/c/s.  Reports she had an abx on hand from [facility] in the past 0 cephalexin.  She started this x 3 days ago.  Not seeing much relief.  Reports she usually needs prednisone to go along with abx.  Dr .Maple Hudson, pls advise if ok to give pred.  Thank you.  Last OV with [facility]: 09/12/12  [facility] [address] [facility] [place]  Allergies verified with pt"  Allergies  Allergen Reactions  . Aspirin Shortness Of Breath

## 2012-12-21 ENCOUNTER — Encounter: Payer: Self-pay | Admitting: Internal Medicine

## 2012-12-21 ENCOUNTER — Ambulatory Visit (INDEPENDENT_AMBULATORY_CARE_PROVIDER_SITE_OTHER): Payer: Managed Care, Other (non HMO) | Admitting: Internal Medicine

## 2012-12-21 VITALS — BP 110/70 | HR 69 | Ht 69.0 in | Wt 175.0 lb

## 2012-12-21 DIAGNOSIS — K222 Esophageal obstruction: Secondary | ICD-10-CM

## 2012-12-21 DIAGNOSIS — J449 Chronic obstructive pulmonary disease, unspecified: Secondary | ICD-10-CM

## 2012-12-21 DIAGNOSIS — J441 Chronic obstructive pulmonary disease with (acute) exacerbation: Secondary | ICD-10-CM

## 2012-12-21 NOTE — Progress Notes (Signed)
Subjective:    Patient ID: [name] [name], female    DOB: 01/15/1951, 62 y.o.   MRN: [id number]  HPI 10/14/10- 12 yoF with asthma, allergic rhinitis.  Last here August 31, 2010- Because of problems with GERD and yeast we were hoping to reduce prednisone which she has been on for much of the time since the 1970's. She has maintained mostly on 5 mg daily. We gave 1 mg tabs, but she didn't feel that 4 mg held her.  Heart burn is better and congestion a bit worse. Pollen season usually gets her some congested. She had been on Xolair x 3 years, then wheezed worse and it was stopped.  Today she feels ok, but changes day-to-day. Main trigger for her she thinks is temperature change/ weather. [place] trip - wheezed in [place].   11/13/10- Asthma, allergic rhinitis/ recurrent sinusitis Called last week and we had her take prednisone 10 mg daily x 7 days then go back to 5 mg daily. She says 5 mg isn't enough. Post nasal drip is getting in her lungs and requiring use of rescue inhaler several times daily. Denies dyspnea or headache. Occasional throat clearing of yellow mucus. Little sustained wheeze, just feels tight.  01/11/11- 59 yoF former smoker, followed for asthma, allergic rhinitis/ hx nasal polyps/ aspirin allergy, recurrent sinusitis complicated by GERD Maintenance prednisone was on 5 mg daily, then went up to 40 and now back to 20/day.  Feels fine now, but last week throat tickle progressed into wheeze and she called [place]. [place], basement, carpet. Says there was mold , but she can't smell so she can't tell. Carpet was replaced and visible leak fixed. Also aware of reflux- diet controlled and continues Zegerid.   04/13/11- 59 yoF former smoker, followed for asthma, allergic rhinitis/ hx nasal polyps/ aspirin allergy, recurrent sinusitis complicated by GERD And she declines flu vaccine. She tried Xolair injections for 3 years with no benefit. At last visit we tried tapering  prednisone to maintenance 5 mg daily. She developed increasing nasal congestion and postnasal drainage so she went back up to 10 mg of prednisone daily. With that she feels better but she says that is not enough prednisone to restore her sense of smell. Using rescue inhaler about one time daily for tickle and chest tightness. Daily Water Pik for her nose returns some yellow material. She does not feel that she has a sinus infection. She has not had recent followup with Dr. Erik Obey ENT and was to see him again as needed. He had told her her nasal problems were allergy  .+nasal congestion, post nasal drip,  11/16/11-  36 yoF former smoker, followed for asthma, allergic rhinitis/ hx nasal polyps/ aspirin allergy, recurrent sinusitis complicated by GERD She is past the spring seasonal rhinitis time for her. Had a skin rash diagnosed as "allergic" at an urgent care they increase prednisone and gave cortisone cream but she is now back to her maintenance prednisone 10 mg daily. The rash is better and she feels fairly stable. Denies wheeze or cough. Still no sense of smell even on a prednisone 60 mg taper.  03/16/2012 Acute OV  Presents for an acute office visit.  Called in 10/1 with 3 weeks of cough /wheezing, prednisone taper sent  She returns today feeling some better but still has some drainage w/ tickle in throat and throat clearing often.  Went to UC initially  w/ depo x2, pred pak and zpak.  Denies cough, SOB, tightness in chest. Or edema.  Currently healing from Rigth thumb fx in cast  Nasal sprays not helping.   06/01/12- 60 yoF former smoker, followed for asthma, allergic rhinitis/ hx nasal polyps/ aspirin allergy, recurrent sinusitis complicated by GERD folllows for : pt states still having gerd like sxs since having gallbladder surg 05-05-12.  states has been having some sob,wheezing,.throat clearing.chest tightness. denies any cough ,fever, When she returned to her GERD diet after gallbladder  surgery, she began experiencing tightness in throat and upper chest. This feeling comes and goes and has noticed also on waking. She is continued chronic maintenance prednisone 10 mg daily after repeatedly trying to reduce the dose and feeling that her breathing deteriorated. Rescue inhaler has little effect. Minor throat tickle. Denies coughing. She continues omeprazole and Carafate and does not feel heartburn. She does not distinguish well between reflux and asthma symptoms CXR 05/01/12-reviewed IMPRESSION:  No evidence for acute cardiopulmonary abnormality.  Original Report Authenticated By: Norva Pavlov, M.D.  Office Spirometry 06/01/12- moderate obstructive airways disease. FVC 3.15/85%, FEV1 1.87/65%, FEV1/FVC 0.59% FEF 25-75% 0.92/36% PFT 02/01/08- we compared her old PFT-mild obstructive airways disease with minimal response to bronchodilator. FVC 4.14/107%, FEV1 2.83/98%, FEV1/FVC 0.68, FEF 25-75% 1.72/56%. Scores have declined.  07/20/12- 61 yoF former smoker, followed for asthma, allergic rhinitis/ hx nasal polyps/ aspirin allergy, recurrent sinusitis complicated by GERD FOLLOWS FOR: review and PFT results with patient. She says she is "frustrated" by her shortness of breath, but not coughing. Still on chronic prednisone 10 mg daily. She continues to notice GERD. Albuterol helps chest tightness. She asks primary care referral. PFT 07/20/2012-moderate obstructive airways disease with response to bronchodilator, normal lung volumes, diffusion mildly reduced. FVC 3.87/107%, FEV1 2.37/88%, FEV1/FVC 0.61, FEF 25-75% 1.12/39%. TLC 104%, DLCO 71%. 07/20/2012-98%, 97%, 98%, 444 m. Noted hip pain. No oxygen limitation.  08/10/12- 61 yoF former smoker, followed for asthma, allergic rhinitis/ hx nasal polyps/ aspirin allergy, recurrent sinusitis complicated by GERD FOLLOWS FOR: states Dulera is not working now; was not feeling as much chest tightness with use but now is having increased and  worsening tightness in chest; denies any cough or wheezing. She declined home nebulizer saying she had tried it before with no benefit. Echocardiogram 08/03/2012-EF 55-60%, gr 1 diastolic dysfunction. No pulmonary hypertension or wall motion abnormality.  09/12/12- 61 yoF former smoker, followed for asthma, allergic rhinitis/ hx nasal polyps/ aspirin allergy, recurrent sinusitis complicated by GERD FOLLOWS FOR: still having chest tightness. Denies any wheezing or cough. Has recently noticed slight tickly/clearing throat since being around sick co worker. Admits she has been worried about her respiratory prognosis since diagnosed with COPD. Which she was exposed to a friend with a cold, she began using an antibiotic she had a home and Mucinex. She still feels somewhat tight without pain. She failed Spiriva but continues Dulera and remains on prednisone 10 mg daily maintenance which we discussed again. GI give her Levsin for possible esophageal spasm with minimal benefit. Dr. Yetta Barre treated with Zoloft but it just made her nervous.  12/21/12-61 yoF former smoker, followed for asthma, allergic rhinitis/ hx nasal polyps/ aspirin allergy, recurrent sinusitis complicated by GERD FOLLOW ONG:EXBMW tightness better with Librax still on 3/day,denies sob,wheezing, and cough Continues prednisone 10 mg daily and has not felt she could wean off. We discussed steroid side effects again. Had bone density.  Denies shortness of breath, cough or wheeze.  ROS-see HPI Constitutional:   No-   weight loss,  night sweats, fevers, chills, fatigue, lassitude. HEENT:   No-  headaches, difficulty swallowing, tooth/dental problems, sore throat,       No-  sneezing, itching, ear ache, nasal congestion, post nasal drip,  CV:  No-   chest pain, orthopnea, PND, swelling in lower extremities, anasarca, dizziness, palpitations Resp: + shortness of breath with exertion or at rest.              No-   productive cough,  No non-productive  cough,  No- coughing up of blood.              No-   change in color of mucus.  No- wheezing.   Skin: No-   rash or lesions. GI:  + heartburn, indigestion, no-abdominal pain, nausea, vomiting,  GU:  MS:  No-   joint pain or swelling.   Neuro-     nothing unusual Psych:  No- change in mood or affect. No depression, + anxiety.  No memory loss.  Objective:  OBJ- Physical Exam General- Alert, Oriented, Affect-appropriate, Distress- none acute Skin- rash-none, lesions- none, excoriation- none Lymphadenopathy- none Head- atraumatic            Eyes- Gross vision intact, PERRLA, conjunctivae and secretions clear            Ears- Hearing, canals-normal            Nose- Clear, no-Septal dev, mucus, polyps, erosion, perforation             Throat- Mallampati II , mucosa clear , drainage- none, tonsils- atrophic Neck- flexible , trachea midline, no stridor , thyroid nl, carotid no bruit Chest - symmetrical excursion , unlabored           Heart/CV- RRR , no murmur , no gallop  , no rub, nl s1 s2, P2 not increased.                           - JVD+1 cm R>L , edema- none, stasis changes- none, varices- none           Lung- clear to P&A with good inspiratory effort, wheeze- none, cough- none , dullness-none, rub- none           Chest wall-  Abd-  Br/ Gen/ Rectal- Not done, not indicated  Extrem- cyanosis- none, clubbing, none, atrophy- none, strength- nl Neuro- grossly intact to observation

## 2012-12-21 NOTE — Patient Instructions (Addendum)
We can continue present meds  Order- [facility] enroll [facility]      Dx COPD    Gold II

## 2013-01-07 NOTE — Assessment & Plan Note (Signed)
She had questions about COPD which we reviewed. She will continue current medications. I encouraged a trial of pulmonary rehabilitation.

## 2013-01-07 NOTE — Assessment & Plan Note (Signed)
Librax relieves uncomfortable sense of substernal tightness, suggesting a combination of anxiety and/or esophageal symptoms.

## 2013-01-12 ENCOUNTER — Telehealth: Payer: Self-pay | Admitting: Internal Medicine

## 2013-01-12 DIAGNOSIS — J449 Chronic obstructive pulmonary disease, unspecified: Secondary | ICD-10-CM

## 2013-01-12 NOTE — Telephone Encounter (Signed)
LMTCB x1 for pt.  

## 2013-01-12 NOTE — Telephone Encounter (Signed)
I think the humid weather is bothering a lot of our patients Suggest we order incentive spirometer to use 3 times daily "to help her stretch her chest loose"

## 2013-01-12 NOTE — Telephone Encounter (Signed)
Called office # and was advised they were closed I called home # and spouse gave me her cell # I called cell # [phone] and had to [facility] x1

## 2013-01-12 NOTE — Telephone Encounter (Signed)
Last OV 12/21/12. Pt c/o increased chest tightness over the last 2 days. She denies any wheezing, cough, sob, or chest congestion, only has chest tightness. Pt states she is taking librax three times a day as well as the dulera and prednisone 10mg . Please advise. [name], CMA Allergies  Allergen Reactions  . Aspirin Shortness Of Breath

## 2013-01-12 NOTE — Telephone Encounter (Signed)
Returning call can be reached at [phone].[name] [name]

## 2013-01-13 ENCOUNTER — Telehealth: Payer: Self-pay | Admitting: Internal Medicine

## 2013-01-13 NOTE — Telephone Encounter (Signed)
Called with same complaint, says she's already trying deep breathing exercises. Advised to contact Dr Maple Hudson 8/4 or go to ER in meantime if condition worsens

## 2013-01-15 NOTE — Telephone Encounter (Signed)
[name] [name], CMA at 01/15/2013 10:04 AM   Status: Signed            I spoke with pt. She stated she has already tried the "deep breathing exercises". She stated she has been trying to stay active. She stated at times she still has the tightness for no reason and notices this when she sits. She was fine yesterday and today. We were not able to get in touch with pt on Friday after I realized this and hung up with pt.  Per phone note from 01/12/13:      [name] Budge, MD at 01/12/2013 4:57 PM       Status:  Signed                  I think the humid weather is bothering a lot of our patients  Suggest we order incentive spirometer to use 3 times daily "to help her stretch her chest loose"      LMTCB x1 to make her aware of this and we have the incentive spirometer in triage

## 2013-01-15 NOTE — Telephone Encounter (Signed)
Pt stated she is returning call from [place] and can be reached @ [phone] ext [phone]. [name] [name]

## 2013-01-15 NOTE — Telephone Encounter (Signed)
I spoke with pt. She stated she has already tried the "deep breathing exercises". She stated she has been trying to stay active. She stated at times she still has the tightness for no reason and notices this when she sits. She was fine yesterday and today. We were not able to get in touch with pt on Friday after I realized this and hung up with pt.  Per phone note from 01/12/13: Waymon Budge, MD at 01/12/2013 4:57 PM   Status: Signed            I think the humid weather is bothering a lot of our patients  Suggest we order incentive spirometer to use 3 times daily "to help her stretch her chest loose"    LMTCB x1 to make her aware of this and we have the incentive spirometer in triage

## 2013-01-15 NOTE — Telephone Encounter (Signed)
TFor mouth use otc Biotene mouth rinse twice daily

## 2013-01-15 NOTE — Telephone Encounter (Signed)
i have lmomtcb on pt's home, cell, and work #s

## 2013-01-15 NOTE — Telephone Encounter (Signed)
I spoke with pt. She will p/u the incentive spirometer. This has been left upfront Pt is also stated she finished the dukes MMW. Pt reports her mouth is still feeling irritated and she is rinsing after each use. She is requesting further recs Dr. Maple Hudson thanks  Allergies  Allergen Reactions  . Aspirin Shortness Of Breath

## 2013-01-15 NOTE — Telephone Encounter (Signed)
Pt's husband Chrissie Noa) is aware of CY recs.

## 2013-01-16 ENCOUNTER — Other Ambulatory Visit: Payer: Self-pay | Admitting: Gastroenterology

## 2013-02-11 ENCOUNTER — Other Ambulatory Visit: Payer: Self-pay | Admitting: Internal Medicine

## 2013-02-16 ENCOUNTER — Telehealth: Payer: Self-pay | Admitting: Internal Medicine

## 2013-02-16 MED ORDER — MOMETASONE FURO-FORMOTEROL FUM 200-5 MCG/ACT IN AERO: 2.0000 | INHALATION_SPRAY | Freq: Two times a day (BID) | RESPIRATORY_TRACT | Status: DC

## 2013-02-16 NOTE — Telephone Encounter (Signed)
As a test, try reducing the Dulera to 1 puff, then rinse, twice daily- through the Aerochamber inhaler

## 2013-02-16 NOTE — Telephone Encounter (Signed)
I spoke with pt. She is aware of recs. Nothing further needed

## 2013-02-16 NOTE — Telephone Encounter (Signed)
I spoke with pt. She is needing dulera sent to [facility]. I have done so Pt also reports the biotene mouth wash is not helping with her tongue problem. She reports when she eats something she still has the raw/burning sensation on her tongue. She is rinsing her mouth out after each use of her inhaler. She is requesting further recs. Please advise Dr. Maple Hudson thanks  Allergies  Allergen Reactions  . Aspirin Shortness Of Breath

## 2013-02-27 ENCOUNTER — Telehealth: Payer: Self-pay | Admitting: Internal Medicine

## 2013-02-27 NOTE — Telephone Encounter (Signed)
i called and spoke with pts husband and he stated that the pt is almost out of the dulera.  rx was sent in on 02/16/13 for the dulera to [facility].  i advised the pts husband that we will have to call the pharmacy and find out what is going on with the delivery.  [facility] to [facility] and they are now closed at 5:20.   We will try and call tomorrow.  He will pass this along to the pt.

## 2013-02-28 MED ORDER — MOMETASONE FURO-FORMOTEROL FUM 200-5 MCG/ACT IN AERO: 2.0000 | INHALATION_SPRAY | Freq: Two times a day (BID) | RESPIRATORY_TRACT | Status: DC

## 2013-02-28 NOTE — Telephone Encounter (Signed)
[facility] and was on hold x 10 mins and was not able to reach anyone. WCB

## 2013-02-28 NOTE — Telephone Encounter (Signed)
Spoke with [facility]-- Patient still has refills with them They will send out today, expect to arrive in 5-7 business days In the future, This company does not take eScripts Rxs  Spoke with patient and informed her of the above and that refills were to be shipped today Patient states she only has a few puffs left on inhaler I have left sample upfront for her pickup until her refills are shipped to her Patient verbalized understanding and nothing further needed at this time

## 2013-02-28 NOTE — Telephone Encounter (Signed)
Pt calling in ref to previous msg can be reached at [phone].[name] [name]

## 2013-03-10 ENCOUNTER — Other Ambulatory Visit: Payer: Self-pay | Admitting: Internal Medicine

## 2013-03-22 ENCOUNTER — Telehealth: Payer: Self-pay | Admitting: Internal Medicine

## 2013-03-22 DIAGNOSIS — R0789 Other chest pain: Secondary | ICD-10-CM

## 2013-03-22 MED ORDER — ACETAMINOPHEN-CODEINE #3 300-30 MG PO TABS: ORAL_TABLET | ORAL | Status: DC

## 2013-03-22 NOTE — Telephone Encounter (Signed)
Spoke with the pt and notified of recs per CDY She verbalized understanding Order for cxr placed Rx for tylenol 3 called in  Pt states no chest tightness/wheeze at this point but will call back if she needs neb

## 2013-03-22 NOTE — Telephone Encounter (Signed)
Pt c/o of right rib pain for 2 days.  Pt has h/o rib fracture due to osteoporosis.  Pt states thinks this is similar pain to past rib fx.  Having difficulty using Combivent and Dulera due to pain with inhalation.  Tylenol not helping.  Pt is also wearing a rib binder.  Please advise on what pt can take for pain and also advice on using inhalers until pain is better.

## 2013-03-22 NOTE — Telephone Encounter (Signed)
Does she need CXR- We can order with rib details for the side that hurts for dx chest wall pain  Offer Tylenol # III,   # 25 tabs-  1 or 2 every 6 hours if needed for pain. Heating pad may also help.   If she is getting too tight and can't use her inhalers, we can get her over her for a neb treatment.

## 2013-03-24 ENCOUNTER — Other Ambulatory Visit: Payer: Self-pay | Admitting: Internal Medicine

## 2013-03-24 NOTE — Telephone Encounter (Signed)
Librax reordered

## 2013-04-04 ENCOUNTER — Telehealth (HOSPITAL_COMMUNITY): Payer: Self-pay | Admitting: *Deleted

## 2013-04-04 NOTE — Telephone Encounter (Signed)
Telephone call to patient regarding Pulmonary Rehab.  She is declining due to class times and she is working full time.  Cannot attend. Cathie Olden RN

## 2013-04-19 ENCOUNTER — Other Ambulatory Visit: Payer: Self-pay

## 2013-04-19 ENCOUNTER — Telehealth: Payer: Self-pay | Admitting: Internal Medicine

## 2013-04-19 ENCOUNTER — Telehealth: Payer: Self-pay | Admitting: Gastroenterology

## 2013-04-19 NOTE — Telephone Encounter (Signed)
Pt advised. Jennifer Castillo, CMA  

## 2013-04-19 NOTE — Telephone Encounter (Signed)
I spoke with pt. She reports she was placed on Augmentin 2 days ago bc she was bite by a dog by [employer]. 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  For 2 days she has been feeling like her esophagus is tight since being on ABX. She wants to know if it is the ABX or the reflux. She has taken this ABX in the past and just had upset stomach. Pt has not been taking a probiotic or eating yogurt while on ABX. Please advise Dr. Maple Hudson thanks  Allergies  Allergen Reactions  . Aspirin Shortness Of Breath

## 2013-04-19 NOTE — Telephone Encounter (Signed)
Suggest she try taking the augmentin mid-way through meals, so she is swallowing more food after she takes it. Ok to take a probiotic like Comptroller, or Activia yogurt.

## 2013-04-19 NOTE — Telephone Encounter (Signed)
Patient says that she has tightness in her chest.  She recently has started drinking eggnog and she was started on antibiotics for a dog bite. Her pulmonologist has asked her to call us, he is telling her that her symptoms can also be from GERD.  She is advised that she could stop the egg nog and see if her symptoms improve although unlikely to cause symptoms.  She will call back for additional questions or concerns.  She is also reminded to continue protonix BID, and librax

## 2013-04-19 NOTE — Telephone Encounter (Signed)
Returning call can be reached at [phone].Raylene Everts

## 2013-04-19 NOTE — Telephone Encounter (Signed)
lmomtcb x1 for pt 

## 2013-04-19 NOTE — Telephone Encounter (Signed)
Left message for patient to call back  

## 2013-06-15 ENCOUNTER — Telehealth: Payer: Self-pay | Admitting: Internal Medicine

## 2013-06-15 MED ORDER — MOMETASONE FURO-FORMOTEROL FUM 200-5 MCG/ACT IN AERO: 2.0000 | INHALATION_SPRAY | Freq: Two times a day (BID) | RESPIRATORY_TRACT | Status: DC

## 2013-06-15 NOTE — Telephone Encounter (Signed)
Returning call.[name] [name] ° °

## 2013-06-15 NOTE — Telephone Encounter (Signed)
Pt states that she was signed up for Patient Assist for [facility]  Pt has yet to receive her 3 mo supply.  Looks as though Rx has ran out/expired. Will resend [facility] Rx  Rx printed to be signed by CDY.  needs to be faxed to [place] at [phone]

## 2013-06-15 NOTE — Telephone Encounter (Signed)
lmomtcb  

## 2013-06-19 MED ORDER — MOMETASONE FURO-FORMOTEROL FUM 200-5 MCG/ACT IN AERO: 2.0000 | INHALATION_SPRAY | Freq: Two times a day (BID) | RESPIRATORY_TRACT | Status: DC

## 2013-06-19 NOTE — Telephone Encounter (Signed)
This is the First time I have seen this message; if patient receives Pt assistance for this medication then she will need to call them about why she has not gotten her Rx; if they need new Rx then its okay to print and fax new Rx for her. Thanks.

## 2013-06-19 NOTE — Telephone Encounter (Signed)
I have faxed RX over on pt. Nothing further needed

## 2013-06-19 NOTE — Telephone Encounter (Signed)
Please advise [name] [name] if this has been taken care of? thanks

## 2013-06-21 ENCOUNTER — Telehealth: Payer: Self-pay | Admitting: Internal Medicine

## 2013-06-21 NOTE — Telephone Encounter (Signed)
I called and made pt aware we have faxed the dulera over to [facility] on 06/19/13 per requests from 06/15/13 phone note. She reports she does not know anything about [facility]. She thought she was getting pt assistance. I advised will have to check on this and call her back. I called [facility] at [phone]. Was told I needed to call [phone] to check on this medication for pt. Spoke with [employer] The dulera was mailed out to her yesterday. Pt is also due to reapply in March for her dulera.   Called pt LMTCB x1

## 2013-06-21 NOTE — Telephone Encounter (Signed)
Pt called back. She is aware. Nothing further needed

## 2013-06-28 ENCOUNTER — Ambulatory Visit: Payer: Managed Care, Other (non HMO) | Admitting: Internal Medicine

## 2013-07-04 ENCOUNTER — Telehealth: Payer: Self-pay | Admitting: Internal Medicine

## 2013-07-04 ENCOUNTER — Other Ambulatory Visit: Payer: Self-pay | Admitting: Internal Medicine

## 2013-07-04 NOTE — Telephone Encounter (Signed)
Called work # and is now closed.  Called home # lm w/ spouse and also he requests i call back and leave message on VM as well. I have done so.

## 2013-07-05 ENCOUNTER — Telehealth: Payer: Self-pay | Admitting: Internal Medicine

## 2013-07-05 MED ORDER — CILIDINIUM-CHLORDIAZEPOXIDE 2.5-5 MG PO CAPS: ORAL_CAPSULE | ORAL | Status: DC

## 2013-07-05 NOTE — Addendum Note (Signed)
Addended by: Clayborne Dana C on: 07/05/2013 05:30 PM   Modules accepted: Orders

## 2013-07-05 NOTE — Telephone Encounter (Signed)
Refill sent and pt advised.Quitman Bing, CMA

## 2013-07-05 NOTE — Telephone Encounter (Signed)
I called and spoke with Radhika. She reports received escript for pt librex. They only have authorized to fill this medication by Dr. Earnest Conroy. I advised he is one of our hospitalitis and Dr. Annamaria Boots has been filling this for pt. I gave her VO for this medication. Nothing further needed

## 2013-07-27 ENCOUNTER — Encounter: Payer: Self-pay | Admitting: Internal Medicine

## 2013-07-27 ENCOUNTER — Ambulatory Visit (INDEPENDENT_AMBULATORY_CARE_PROVIDER_SITE_OTHER): Payer: Managed Care, Other (non HMO) | Admitting: Internal Medicine

## 2013-07-27 VITALS — BP 130/88 | HR 86 | Ht 69.0 in | Wt 167.0 lb

## 2013-07-27 DIAGNOSIS — J449 Chronic obstructive pulmonary disease, unspecified: Secondary | ICD-10-CM

## 2013-07-27 MED ORDER — FLUCONAZOLE 150 MG PO TABS: 150.0000 mg | ORAL_TABLET | Freq: Every day | ORAL | Status: DC

## 2013-07-27 NOTE — Progress Notes (Signed)
Subjective:    Patient ID: [name] [name], female    DOB: 01/15/1951, 63 y.o.   MRN: [id number]  HPI 10/14/10- 12 yoF with asthma, allergic rhinitis.  Last here August 31, 2010- Because of problems with GERD and yeast we were hoping to reduce prednisone which she has been on for much of the time since the 1970's. She has maintained mostly on 5 mg daily. We gave 1 mg tabs, but she didn't feel that 4 mg held her.  Heart burn is better and congestion a bit worse. Pollen season usually gets her some congested. She had been on Xolair x 3 years, then wheezed worse and it was stopped.  Today she feels ok, but changes day-to-day. Main trigger for her she thinks is temperature change/ weather. [place] trip - wheezed in [place].   11/13/10- Asthma, allergic rhinitis/ recurrent sinusitis Called last week and we had her take prednisone 10 mg daily x 7 days then go back to 5 mg daily. She says 5 mg isn't enough. Post nasal drip is getting in her lungs and requiring use of rescue inhaler several times daily. Denies dyspnea or headache. Occasional throat clearing of yellow mucus. Little sustained wheeze, just feels tight.  01/11/11- 59 yoF former smoker, followed for asthma, allergic rhinitis/ hx nasal polyps/ aspirin allergy, recurrent sinusitis complicated by GERD Maintenance prednisone was on 5 mg daily, then went up to 40 and now back to 20/day.  Feels fine now, but last week throat tickle progressed into wheeze and she called [place]. [place] home[place], basement, carpet. Says there was mold , but she can't smell so she can't tell. Carpet was replaced and visible leak fixed. Also aware of reflux- diet controlled and continues Zegerid.   04/13/11- 59 yoF former smoker, followed for asthma, allergic rhinitis/ hx nasal polyps/ aspirin allergy, recurrent sinusitis complicated by GERD And she declines flu vaccine. She tried Xolair injections for 3 years with no benefit. At last visit we tried tapering  prednisone to maintenance 5 mg daily. She developed increasing nasal congestion and postnasal drainage so she went back up to 10 mg of prednisone daily. With that she feels better but she says that is not enough prednisone to restore her sense of smell. Using rescue inhaler about one time daily for tickle and chest tightness. Daily Water Pik for her nose returns some yellow material. She does not feel that she has a sinus infection. She has not had recent followup with Dr. Erik Obey ENT and was to see him again as needed. He had told her her nasal problems were allergy  .+nasal congestion, post nasal drip,  11/16/11-  36 yoF former smoker, followed for asthma, allergic rhinitis/ hx nasal polyps/ aspirin allergy, recurrent sinusitis complicated by GERD She is past the spring seasonal rhinitis time for her. Had a skin rash diagnosed as "allergic" at an urgent care they increase prednisone and gave cortisone cream but she is now back to her maintenance prednisone 10 mg daily. The rash is better and she feels fairly stable. Denies wheeze or cough. Still no sense of smell even on a prednisone 60 mg taper.  03/16/2012 Acute OV  Presents for an acute office visit.  Called in 10/1 with 3 weeks of cough /wheezing, prednisone taper sent  She returns today feeling some better but still has some drainage w/ tickle in throat and throat clearing often.  Went to UC initially  w/ depo x2, pred pak and zpak.  Denies cough, SOB, tightness in chest. Or edema.  Currently healing from Rigth thumb fx in cast  Nasal sprays not helping.   06/01/12- 63 yoF former smoker, followed for asthma, allergic rhinitis/ hx nasal polyps/ aspirin allergy, recurrent sinusitis complicated by GERD folllows for : pt states still having gerd like sxs since having gallbladder surg 05-05-12.  states has been having some sob,wheezing,.throat clearing.chest tightness. denies any cough ,fever, When she returned to her GERD diet after gallbladder  surgery, she began experiencing tightness in throat and upper chest. This feeling comes and goes and has noticed also on waking. She is continued chronic maintenance prednisone 10 mg daily after repeatedly trying to reduce the dose and feeling that her breathing deteriorated. Rescue inhaler has little effect. Minor throat tickle. Denies coughing. She continues omeprazole and Carafate and does not feel heartburn. She does not distinguish well between reflux and asthma symptoms CXR 05/01/12-reviewed IMPRESSION:  No evidence for acute cardiopulmonary abnormality.  Original Report Authenticated By: Nolon Nations, M.D.  Office Spirometry 06/01/12- moderate obstructive airways disease. FVC 3.15/85%, FEV1 1.87/65%, FEV1/FVC 0.59% FEF 25-75% 0.92/36% PFT 02/01/08- we compared her old PFT-mild obstructive airways disease with minimal response to bronchodilator. FVC 4.14/107%, FEV1 2.83/98%, FEV1/FVC 0.68, FEF 25-75% 1.72/56%. Scores have declined.  07/20/12- 61 yoF former smoker, followed for asthma, allergic rhinitis/ hx nasal polyps/ aspirin allergy, recurrent sinusitis complicated by GERD FOLLOWS FOR: review 6Mw and PFT results with patient. She says she is "frustrated" by her shortness of breath, but not coughing. Still on chronic prednisone 10 mg daily. She continues to notice GERD. Albuterol helps chest tightness. She asks primary care referral. PFT 07/20/2012-moderate obstructive airways disease with response to bronchodilator, normal lung volumes, diffusion mildly reduced. FVC 3.87/107%, FEV1 2.37/88%, FEV1/FVC 0.61, FEF 25-75% 1.12/39%. TLC 104%, DLCO 71%. 6MWT 07/20/2012-98%, 97%, 98%, 444 m. Noted hip pain. No oxygen limitation.  08/10/12- 61 yoF former smoker, followed for asthma, allergic rhinitis/ hx nasal polyps/ aspirin allergy, recurrent sinusitis complicated by GERD FOLLOWS FOR: states Dulera is not working now; was not feeling as much chest tightness with use but now is having increased and  worsening tightness in chest; denies any cough or wheezing. She declined home nebulizer saying she had tried it before with no benefit. Echocardiogram 08/03/2012-EF 32-20%, gr 1 diastolic dysfunction. No pulmonary hypertension or wall motion abnormality.  09/12/12- 61 yoF former smoker, followed for asthma, allergic rhinitis/ hx nasal polyps/ aspirin allergy, recurrent sinusitis complicated by GERD FOLLOWS FOR: still having chest tightness. Denies any wheezing or cough. Has recently noticed slight tickly/clearing throat since being around sick co worker. Admits she has been worried about her respiratory prognosis since diagnosed with COPD. Which she was exposed to a friend with a cold, she began using an antibiotic she had a home and Mucinex. She still feels somewhat tight without pain. She failed Spiriva but continues Dulera and remains on prednisone 10 mg daily maintenance which we discussed again. GI give her Levsin for possible esophageal spasm with minimal benefit. Dr. Ronnald Ramp treated with Zoloft but it just made her nervous.  12/21/12-61 yoF former smoker, followed for asthma, allergic rhinitis/ hx nasal polyps/ aspirin allergy, recurrent sinusitis complicated by GERD FOLLOW URK:[facility] tightness better with Librax still on 3/day,denies sob,wheezing, and cough Continues prednisone 10 mg daily and has not felt she could wean off. We discussed steroid side effects again. Had bone density.  Denies shortness of breath, cough or wheeze.  07/27/13- 61 yoF former smoker, followed for asthma, allergic rhinitis/  hx nasal polyps/ aspirin allergy, recurrent sinusitis complicated by GERD FOLLOWS FOR:  Breathing doing well -- Discuss exercising Feeling very much better with less cough and chest tightness. Uses Librax 3 times daily as needed for chest tightness, and occasional Levsin with Protonix twice daily.  ROS-see HPI Constitutional:   No-   weight loss, night sweats, fevers, chills, fatigue,  lassitude. HEENT:   No-  headaches, difficulty swallowing, tooth/dental problems, sore throat,       No-  sneezing, itching, ear ache, nasal congestion, post nasal drip,  CV:  No-   chest pain, orthopnea, PND, swelling in lower extremities, anasarca, dizziness, palpitations Resp: + shortness of breath with exertion or at rest.              No-   productive cough,  No non-productive cough,  No- coughing up of blood.              No-   change in color of mucus.  No- wheezing.   Skin: No-   rash or lesions. GI:  + heartburn, indigestion, no-abdominal pain, nausea, vomiting,  GU:  MS:  No-   joint pain or swelling.   Neuro-     nothing unusual Psych:  No- change in mood or affect. No depression, + anxiety.  No memory loss.  Objective:  OBJ- Physical Exam General- Alert, Oriented, Affect-appropriate, Distress- none acute Skin- rash-none, lesions- none, excoriation- none Lymphadenopathy- none Head- atraumatic            Eyes- Gross vision intact, PERRLA, conjunctivae and secretions clear            Ears- Hearing, canals-normal            Nose- Clear, no-Septal dev, mucus, polyps, erosion, perforation             Throat- Mallampati II , mucosa+thrush , drainage- none, tonsils- atrophic Neck- flexible , trachea midline, no stridor , thyroid nl, carotid no bruit Chest - symmetrical excursion , unlabored           Heart/CV- RRR , no murmur , no gallop  , no rub, nl s1 s2, P2 not increased.                           - JVD+1 cm R>L , edema- none, stasis changes- none, varices- none           Lung- clear to P&A with good inspiratory effort, wheeze- none, cough- none ,                     dullness-none, rub- none           Chest wall-  Abd-  Br/ Gen/ Rectal- Not done, not indicated  Extrem- cyanosis- none, clubbing, none, atrophy- none, strength- nl Neuro- grossly intact to observation

## 2013-07-27 NOTE — Patient Instructions (Signed)
Script for diflucan for thrush - sent  Reduce Dulera to 1 puff, then rinse mouth, twice daily  Reduce daily prednisone to 1/2 x 10 mg tab= 5 mg daily

## 2013-08-10 ENCOUNTER — Telehealth: Payer: Self-pay | Admitting: Internal Medicine

## 2013-08-10 NOTE — Telephone Encounter (Signed)
Spoke with the pt  She is c/o PND and frequent throat clearing  Unable to produce sputum  She states taking mucinex, and has starting biaxin 500 mg qid as of this am  She is also on 10 mg pred  Please advise thanks! Last ov 07/27/13 Next ov 11/26/13 Allergies  Allergen Reactions  . Aspirin Shortness Of Breath   Current Outpatient Prescriptions on File Prior to Visit  Medication Sig Dispense Refill  . acetaminophen-codeine (TYLENOL #3) 300-30 MG per tablet 1-2 tablets every 6 hrs as needed for pain  25 tablet  0  . clidinium-chlordiazePOXIDE (LIBRAX) 5-2.5 MG per capsule TAKE 1 CAPSULE BY MOUTH 3 (THREE) TIMES DAILY AS NEEDED.  90 capsule  3  . COMBIVENT RESPIMAT 20-100 MCG/ACT AERS respimat INHALE 1 PUFF INTO THE LUNGS 2 (TWO) TIMES DAILY.  4 g  3  . conjugated estrogens (PREMARIN) vaginal cream Place 0.5 g vaginally. As directed      . Diphenhyd-Hydrocort-Nystatin (FIRST-DUKES MOUTHWASH) SUSP Swish and swallow 1 teaspoon QID  150 mL  0  . fluconazole (DIFLUCAN) 150 MG tablet Take 1 tablet (150 mg total) by mouth daily.  4 tablet  0  . hyoscyamine (LEVSIN SL) 0.125 MG SL tablet PLACE 1 TABLET (0.125 MG TOTAL) UNDER THE TONGUE EVERY 4 (FOUR) HOURS AS NEEDED.  60 tablet  6  . Ipratropium-Albuterol (COMBIVENT RESPIMAT) 20-100 MCG/ACT AERS respimat Inhale 1 puff into the lungs 2 (two) times daily.  1 Inhaler  11  . mometasone-formoterol (DULERA) 200-5 MCG/ACT AERO Inhale 2 puffs into the lungs 2 (two) times daily.  3 Inhaler  3  . pantoprazole (PROTONIX) 40 MG tablet Take 1 tablet (40 mg total) by mouth 2 (two) times daily.  60 tablet  10  . predniSONE (DELTASONE) 10 MG tablet TAKE 1 TABLET (10 MG TOTAL) BY MOUTH DAILY.  30 tablet  5  . Spacer/Aero-Holding Chambers (AEROCHAMBER MV) inhaler Use as instructed  1 each  0   No current facility-administered medications on file prior to visit.

## 2013-08-10 NOTE — Telephone Encounter (Signed)
Returning a call

## 2013-08-10 NOTE — Telephone Encounter (Signed)
lmomtcb x1 

## 2013-08-10 NOTE — Telephone Encounter (Signed)
Suggest an otc antihistamine to dry this up- Claritn/ loratadine or Allegra/ fexofenadine

## 2013-08-10 NOTE — Telephone Encounter (Signed)
Pt aware of recs.  Nothing further needed. 

## 2013-08-21 NOTE — Assessment & Plan Note (Signed)
Better control Plan-reduce Dulera 200 to 1 puff twice daily          Reduce prednisone to one half of a 10 mg tablet daily

## 2013-08-23 ENCOUNTER — Other Ambulatory Visit: Payer: Self-pay | Admitting: Internal Medicine

## 2013-08-28 ENCOUNTER — Telehealth: Payer: Self-pay | Admitting: Internal Medicine

## 2013-08-28 NOTE — Telephone Encounter (Signed)
Called and spoke w/ pt. She reports pt was dx w/ a slight touch of PNA and bronchitis. She was told he is not contagious. She wanted to know if she needed to take any precautions. Advised her make sure she does good hand hygiene, sanitizing, etc. She will do so. Nothing further needed

## 2013-09-14 ENCOUNTER — Other Ambulatory Visit: Payer: Self-pay | Admitting: Gastroenterology

## 2013-09-17 NOTE — Telephone Encounter (Signed)
NEEDS OFFICE VISIT FOR ANY FURTHER REFILLS! 

## 2013-10-16 ENCOUNTER — Other Ambulatory Visit: Payer: Self-pay | Admitting: Gastroenterology

## 2013-10-16 ENCOUNTER — Telehealth: Payer: Self-pay | Admitting: Internal Medicine

## 2013-10-16 NOTE — Telephone Encounter (Signed)
Error pt calling wrong dr for this rx.[name] [name]

## 2013-10-17 ENCOUNTER — Telehealth: Payer: Self-pay | Admitting: Gastroenterology

## 2013-10-17 MED ORDER — PANTOPRAZOLE SODIUM 40 MG PO TBEC: DELAYED_RELEASE_TABLET | ORAL | Status: DC

## 2013-10-17 NOTE — Telephone Encounter (Signed)
Sent one refill to the pharmacy until scheduled office visit in June.

## 2013-11-06 ENCOUNTER — Telehealth: Payer: Self-pay | Admitting: Internal Medicine

## 2013-11-06 NOTE — Telephone Encounter (Signed)
Not sure what happened last night, but glad she is feeling better now.  Recommend stick with usual medicines and watch to see how she does.

## 2013-11-06 NOTE — Telephone Encounter (Signed)
Pt returning call.[name] [name] ° °

## 2013-11-06 NOTE — Telephone Encounter (Signed)
LMOM x 1 

## 2013-11-06 NOTE — Telephone Encounter (Signed)
Pt aware of recs per CDY. Pt aware to contact our office if she continues to have increased SOB and difficulty breathing.  Pt expressed understanding.  Nothing further needed.

## 2013-11-06 NOTE — Telephone Encounter (Signed)
Pt states that last night she was experiencing some increased SOB when laying down. Pt states that she felt her chest was a little tight. Pt reports using Combivent Inhaler  Pt reports taking an additional dose of her Dulera to help relieve these issues.  Pt states that she feels just slightly SOB today.  Pt wants to know if this is something that is related her COPD or if this could be an upset of her Reflux.  [facility] uses [facility]  Please advise Dr Annamaria Boots. Thanks.    Medication List       This list is accurate as of: 11/06/13 10:31 AM.  Always use your most recent med list.               acetaminophen-codeine 300-30 MG per tablet  Commonly known as:  TYLENOL #3  1-2 tablets every 6 hrs as needed for pain     AEROCHAMBER MV inhaler  Use as instructed     clidinium-chlordiazePOXIDE 5-2.5 MG per capsule  Commonly known as:  LIBRAX  TAKE 1 CAPSULE BY MOUTH 3 (THREE) TIMES DAILY AS NEEDED.     conjugated estrogens vaginal cream  Commonly known as:  PREMARIN  Place 0.5 g vaginally. As directed     FIRST-DUKES MOUTHWASH Susp  Swish and swallow 1 teaspoon QID     fluconazole 150 MG tablet  Commonly known as:  DIFLUCAN  Take 1 tablet (150 mg total) by mouth daily.     hyoscyamine 0.125 MG SL tablet  Commonly known as:  LEVSIN SL  PLACE 1 TABLET (0.125 MG TOTAL) UNDER THE TONGUE EVERY 4 (FOUR) HOURS AS NEEDED.     Ipratropium-Albuterol 20-100 MCG/ACT Aers respimat  Commonly known as:  COMBIVENT RESPIMAT  Inhale 1 puff into the lungs 2 (two) times daily.     COMBIVENT RESPIMAT 20-100 MCG/ACT Aers respimat  Generic drug:  Ipratropium-Albuterol  INHALE 1 PUFF INTO THE LUNGS 2 (TWO) TIMES DAILY.     mometasone-formoterol 200-5 MCG/ACT Aero  Commonly known as:  DULERA  Inhale 2 puffs into the lungs 2 (two) times daily.     pantoprazole 40 MG tablet  Commonly known as:  PROTONIX  TAKE 1 TABLET (40 MG TOTAL) BY MOUTH 2 (TWO) TIMES DAILY.     predniSONE 10 MG tablet   Commonly known as:  DELTASONE  TAKE 1 TABLET (10 MG TOTAL) BY MOUTH DAILY.

## 2013-11-07 ENCOUNTER — Other Ambulatory Visit: Payer: Self-pay | Admitting: Internal Medicine

## 2013-11-08 ENCOUNTER — Other Ambulatory Visit: Payer: Self-pay | Admitting: Internal Medicine

## 2013-11-08 NOTE — Telephone Encounter (Signed)
Pt calling to check on status of re-fills she is compelety out.[name] [name]

## 2013-11-09 ENCOUNTER — Telehealth: Payer: Self-pay | Admitting: Internal Medicine

## 2013-11-09 NOTE — Telephone Encounter (Signed)
MOM x 2

## 2013-11-09 NOTE — Telephone Encounter (Signed)
LMOM x 3 Left detailed message requesting patient to contact our office back and if we are unavailable to take phone call to leave a good contact # as we keep getting transferred straight to vmail

## 2013-11-09 NOTE — Telephone Encounter (Signed)
Pt returned call

## 2013-11-09 NOTE — Telephone Encounter (Signed)
Pt states that the Doc on call gave 7 day supply and was told to call here for refill for month supply. Pt is aware that I will need to get approval from CY on Monday as he is out of the office today. Pt needs asap on Monday.

## 2013-11-09 NOTE — Telephone Encounter (Signed)
LMTCB

## 2013-11-11 NOTE — Telephone Encounter (Signed)
Ok to refill usual prescription

## 2013-11-12 ENCOUNTER — Telehealth: Payer: Self-pay | Admitting: Gastroenterology

## 2013-11-12 ENCOUNTER — Encounter: Payer: Self-pay | Admitting: Gastroenterology

## 2013-11-12 ENCOUNTER — Ambulatory Visit (INDEPENDENT_AMBULATORY_CARE_PROVIDER_SITE_OTHER): Payer: Managed Care, Other (non HMO) | Admitting: Gastroenterology

## 2013-11-12 VITALS — BP 106/78 | HR 88 | Ht 69.0 in | Wt 168.0 lb

## 2013-11-12 DIAGNOSIS — Z86010 Personal history of colonic polyps: Secondary | ICD-10-CM

## 2013-11-12 DIAGNOSIS — K219 Gastro-esophageal reflux disease without esophagitis: Secondary | ICD-10-CM

## 2013-11-12 MED ORDER — CILIDINIUM-CHLORDIAZEPOXIDE 2.5-5 MG PO CAPS: ORAL_CAPSULE | ORAL | Status: DC

## 2013-11-12 MED ORDER — PANTOPRAZOLE SODIUM 40 MG PO TBEC: DELAYED_RELEASE_TABLET | ORAL | Status: DC

## 2013-11-12 NOTE — Progress Notes (Signed)
    History of Present Illness: This is a 63 year old female with chronic GERD. Her symptoms are well controlled on pantoprazole 40 mg twice daily and antireflux measures. She notes breakthrough symptoms only when she is not following the recommended dietary restrictions for GERD.  Current Medications, Allergies, Past Medical History, Past Surgical History, Family History and Social History were reviewed in [employer] record.  Physical Exam: General: Well developed , well nourished, no acute distress Head: Normocephalic and atraumatic Eyes:  sclerae anicteric, EOMI Ears: Normal auditory acuity Mouth: No deformity or lesions Lungs: Clear throughout to auscultation Heart: Regular rate and rhythm; no murmurs, rubs or bruits Abdomen: Soft, non tender and non distended. No masses, hepatosplenomegaly or hernias noted. Normal Bowel sounds Musculoskeletal: Symmetrical with no gross deformities  Pulses:  Normal pulses noted Extremities: No clubbing, cyanosis, edema or deformities noted Neurological: Alert oriented x 4, grossly nonfocal Psychological:  Alert and cooperative. Normal mood and affect  Assessment and Recommendations:  1. GERD. Continue standard antireflux measures and pantoprazole 40 mg twice daily.  2. Personal history of tubulovillous adenoma. Surveillance colonoscopy recommended May 2016.

## 2013-11-12 NOTE — Telephone Encounter (Signed)
Librax 5-2.5mg  [facility], [address]. Pt aware.

## 2013-11-12 NOTE — Telephone Encounter (Signed)
Patient notified to try OTC antacids when she has a flare.  See office visit from today for further details.  She reports that she has flares when she strays from her diet,

## 2013-11-12 NOTE — Patient Instructions (Signed)
We have sent the following medications to your pharmacy for you to pick up at your convenience: Protonix.  Thank you for choosing me and [facility].  Malcolm T. Stark, Jr., MD., FACG   

## 2013-11-23 ENCOUNTER — Telehealth: Payer: Self-pay | Admitting: Internal Medicine

## 2013-11-23 MED ORDER — MOMETASONE FURO-FORMOTEROL FUM 200-5 MCG/ACT IN AERO: 1.0000 | INHALATION_SPRAY | Freq: Two times a day (BID) | RESPIRATORY_TRACT | Status: DC

## 2013-11-23 NOTE — Telephone Encounter (Signed)
Called and spoke with pt and she stated that she needs the [facility]ill of the [facility].  This has been sent in and nothing further is needed.

## 2013-11-25 ENCOUNTER — Other Ambulatory Visit: Payer: Self-pay | Admitting: Internal Medicine

## 2013-11-26 ENCOUNTER — Ambulatory Visit (INDEPENDENT_AMBULATORY_CARE_PROVIDER_SITE_OTHER): Payer: Managed Care, Other (non HMO) | Admitting: Internal Medicine

## 2013-11-26 ENCOUNTER — Encounter: Payer: Self-pay | Admitting: Internal Medicine

## 2013-11-26 VITALS — BP 126/74 | HR 85 | Ht 69.0 in | Wt 172.0 lb

## 2013-11-26 DIAGNOSIS — J449 Chronic obstructive pulmonary disease, unspecified: Secondary | ICD-10-CM

## 2013-11-26 DIAGNOSIS — J301 Allergic rhinitis due to pollen: Secondary | ICD-10-CM

## 2013-11-26 DIAGNOSIS — B370 Candidal stomatitis: Secondary | ICD-10-CM

## 2013-11-26 MED ORDER — FLUCONAZOLE 150 MG PO TABS: 150.0000 mg | ORAL_TABLET | Freq: Every day | ORAL | Status: DC

## 2013-11-26 MED ORDER — CLARITHROMYCIN 500 MG PO TABS: ORAL_TABLET | ORAL | Status: DC

## 2013-11-26 MED ORDER — UMECLIDINIUM-VILANTEROL 62.5-25 MCG/INH IN AEPB: INHALATION_SPRAY | RESPIRATORY_TRACT | Status: DC

## 2013-11-26 NOTE — Progress Notes (Signed)
Subjective:    Patient ID: [name] [name], female    DOB: 01/15/1951, 63 y.o.   MRN: [id number]  HPI 10/14/10- 12 yoF with asthma, allergic rhinitis.  Last here August 31, 2010- Because of problems with GERD and yeast we were hoping to reduce prednisone which she has been on for much of the time since the 1970's. She has maintained mostly on 5 mg daily. We gave 1 mg tabs, but she didn't feel that 4 mg held her.  Heart burn is better and congestion a bit worse. Pollen season usually gets her some congested. She had been on Xolair x 3 years, then wheezed worse and it was stopped.  Today she feels ok, but changes day-to-day. Main trigger for her she thinks is temperature change/ weather. [place] trip - wheezed in [place].   11/13/10- Asthma, allergic rhinitis/ recurrent sinusitis Called last week and we had her take prednisone 10 mg daily x 7 days then go back to 5 mg daily. She says 5 mg isn't enough. Post nasal drip is getting in her lungs and requiring use of rescue inhaler several times daily. Denies dyspnea or headache. Occasional throat clearing of yellow mucus. Little sustained wheeze, just feels tight.  01/11/11- 63 yoF former smoker, followed for asthma, allergic rhinitis/ hx nasal polyps/ aspirin allergy, recurrent sinusitis complicated by GERD Maintenance prednisone was on 5 mg daily, then went up to 40 and now back to 20/day.  Feels fine now, but last week throat tickle progressed into wheeze and she called [place]. [place] home[place], basement, carpet. Says there was mold , but she can't smell so she can't tell. Carpet was replaced and visible leak fixed. Also aware of reflux- diet controlled and continues Zegerid.   04/13/11- 63 yoF former smoker, followed for asthma, allergic rhinitis/ hx nasal polyps/ aspirin allergy, recurrent sinusitis complicated by GERD And she declines flu vaccine. She tried Xolair injections for 3 years with no benefit. At last visit we tried tapering  prednisone to maintenance 5 mg daily. She developed increasing nasal congestion and postnasal drainage so she went back up to 10 mg of prednisone daily. With that she feels better but she says that is not enough prednisone to restore her sense of smell. Using rescue inhaler about one time daily for tickle and chest tightness. Daily Water Pik for her nose returns some yellow material. She does not feel that she has a sinus infection. She has not had recent followup with Dr. Erik Obey ENT and was to see him again as needed. He had told her her nasal problems were allergy  .+nasal congestion, post nasal drip,  11/16/11-  63 yoF former smoker, followed for asthma, allergic rhinitis/ hx nasal polyps/ aspirin allergy, recurrent sinusitis complicated by GERD She is past the spring seasonal rhinitis time for her. Had a skin rash diagnosed as "allergic" at an urgent care they increase prednisone and gave cortisone cream but she is now back to her maintenance prednisone 10 mg daily. The rash is better and she feels fairly stable. Denies wheeze or cough. Still no sense of smell even on a prednisone 60 mg taper.  03/16/2012 Acute OV  Presents for an acute office visit.  Called in 10/1 with 3 weeks of cough /wheezing, prednisone taper sent  She returns today feeling some better but still has some drainage w/ tickle in throat and throat clearing often.  Went to UC initially  w/ depo x2, pred pak and zpak.  Denies cough, SOB, tightness in chest. Or edema.  Currently healing from Rigth thumb fx in cast  Nasal sprays not helping.   06/01/12- 63 yoF former smoker, followed for asthma, allergic rhinitis/ hx nasal polyps/ aspirin allergy, recurrent sinusitis complicated by GERD folllows for : pt states still having gerd like sxs since having gallbladder surg 05-05-12.  states has been having some sob,wheezing,.throat clearing.chest tightness. denies any cough ,fever, When she returned to her GERD diet after gallbladder  surgery, she began experiencing tightness in throat and upper chest. This feeling comes and goes and has noticed also on waking. She is continued chronic maintenance prednisone 10 mg daily after repeatedly trying to reduce the dose and feeling that her breathing deteriorated. Rescue inhaler has little effect. Minor throat tickle. Denies coughing. She continues omeprazole and Carafate and does not feel heartburn. She does not distinguish well between reflux and asthma symptoms CXR 05/01/12-reviewed IMPRESSION:  No evidence for acute cardiopulmonary abnormality.  Original Report Authenticated By: Nolon Nations, M.D.  Office Spirometry 06/01/12- moderate obstructive airways disease. FVC 3.15/85%, FEV1 1.87/65%, FEV1/FVC 0.59% FEF 25-75% 0.92/36% PFT 02/01/08- we compared her old PFT-mild obstructive airways disease with minimal response to bronchodilator. FVC 4.14/107%, FEV1 2.83/98%, FEV1/FVC 0.68, FEF 25-75% 1.72/56%. Scores have declined.  07/20/12- 63 yoF former smoker, followed for asthma, allergic rhinitis/ hx nasal polyps/ aspirin allergy, recurrent sinusitis complicated by GERD FOLLOWS FOR: review 6Mw and PFT results with patient. She says she is "frustrated" by her shortness of breath, but not coughing. Still on chronic prednisone 10 mg daily. She continues to notice GERD. Albuterol helps chest tightness. She asks primary care referral. PFT 07/20/2012-moderate obstructive airways disease with response to bronchodilator, normal lung volumes, diffusion mildly reduced. FVC 3.87/107%, FEV1 2.37/88%, FEV1/FVC 0.61, FEF 25-75% 1.12/39%. TLC 104%, DLCO 71%. 6MWT 07/20/2012-98%, 97%, 98%, 444 m. Noted hip pain. No oxygen limitation.  08/10/12- 63 yoF former smoker, followed for asthma, allergic rhinitis/ hx nasal polyps/ aspirin allergy, recurrent sinusitis complicated by GERD FOLLOWS FOR: states Dulera is not working now; was not feeling as much chest tightness with use but now is having increased and  worsening tightness in chest; denies any cough or wheezing. She declined home nebulizer saying she had tried it before with no benefit. Echocardiogram 08/03/2012-EF 32-20%, gr 1 diastolic dysfunction. No pulmonary hypertension or wall motion abnormality.  09/12/12- 61 yoF former smoker, followed for asthma, allergic rhinitis/ hx nasal polyps/ aspirin allergy, recurrent sinusitis complicated by GERD FOLLOWS FOR: still having chest tightness. Denies any wheezing or cough. Has recently noticed slight tickly/clearing throat since being around sick co worker. Admits she has been worried about her respiratory prognosis since diagnosed with COPD. Which she was exposed to a friend with a cold, she began using an antibiotic she had a home and Mucinex. She still feels somewhat tight without pain. She failed Spiriva but continues Dulera and remains on prednisone 10 mg daily maintenance which we discussed again. GI give her Levsin for possible esophageal spasm with minimal benefit. Dr. Ronnald Ramp treated with Zoloft but it just made her nervous.  12/21/12-61 yoF former smoker, followed for asthma, allergic rhinitis/ hx nasal polyps/ aspirin allergy, recurrent sinusitis complicated by GERD FOLLOW URK:[facility] tightness better with Librax still on 3/day,denies sob,wheezing, and cough Continues prednisone 10 mg daily and has not felt she could wean off. We discussed steroid side effects again. Had bone density.  Denies shortness of breath, cough or wheeze.  07/27/13- 61 yoF former smoker, followed for asthma, allergic rhinitis/  hx nasal polyps/ aspirin allergy, recurrent sinusitis complicated by GERD FOLLOWS FOR:  Breathing doing well -- Discuss exercising Feeling very much better with less cough and chest tightness. Uses Librax 3 times daily as needed for chest tightness, and occasional Levsin with Protonix twice daily.  11/26/13- 62 yoF former smoker, followed for asthma/ COPD, allergic rhinitis/ hx nasal polyps/ aspirin  allergy, recurrent sinusitis complicated by GERD FOLLOWS FOR: green drainage from sinus' for about 1.5 months. Last night she felt tight in chest (states she had BBQ over the weekend and wonders if GERD flared up her COPD). No headache. Says she feels well now. Stays on prednisone 10 mg daily for lungs. Bone density was checked. Continues Dulera but persistent thrush.  ROS-see HPI Constitutional:   No-   weight loss, night sweats, fevers, chills, fatigue, lassitude. HEENT:   No-  headaches, difficulty swallowing, tooth/dental problems, sore throat,       No-  sneezing, itching, ear ache, +nasal congestion, post nasal drip,  CV:  No-   chest pain, orthopnea, PND, swelling in lower extremities, anasarca, dizziness, palpitations Resp: + shortness of breath with exertion or at rest.              No-   productive cough,  No non-productive cough,  No- coughing up of blood.              No-   change in color of mucus.  No- wheezing.   Skin: No-   rash or lesions. GI:  + heartburn, indigestion, no-abdominal pain, nausea, vomiting,  GU:  MS:  No-   joint pain or swelling.   Neuro-     nothing unusual Psych:  No- change in mood or affect. No depression, + anxiety.  No memory loss.  Objective:  OBJ- Physical Exam General- Alert, Oriented, Affect-appropriate, Distress- none acute Skin- rash-none, lesions- none, excoriation- none Lymphadenopathy- none Head- atraumatic            Eyes- Gross vision intact, PERRLA, conjunctivae and secretions clear            Ears- Hearing, canals-normal            Nose- Clear, no-Septal dev, mucus, polyps, erosion, perforation             Throat- Mallampati II , mucosa+thrush , drainage- none, tonsils- atrophic Neck- flexible , trachea midline, no stridor , thyroid nl, carotid no bruit Chest - symmetrical excursion , unlabored           Heart/CV- RRR , no murmur , no gallop  , no rub, nl s1 s2, P2 not increased.                           - JVD+1 cm R>L , edema-  none, stasis changes- none, varices- none           Lung- clear to P&A with good inspiratory effort, wheeze- none, cough- none ,                                         dullness-none, rub- none           Chest wall-  Abd-  Br/ Gen/ Rectal- Not done, not indicated  Extrem- cyanosis- none, clubbing, none, atrophy- none, strength- nl Neuro- grossly intact to observation

## 2013-11-26 NOTE — Patient Instructions (Addendum)
Script for biaxin antibiotic sent  Script sent for Diflucan for thrush  Sample and printed script to try Anoro inhaler   1 puff, once daily, instead of Dulera     See if this helps clear the thrush in your mouth

## 2013-11-28 ENCOUNTER — Telehealth: Payer: Self-pay | Admitting: Internal Medicine

## 2013-11-28 NOTE — Telephone Encounter (Signed)
Pt still wanted to try for PA. I have mailed paperwork to her. Nothing further needed

## 2013-11-28 NOTE — Telephone Encounter (Signed)
lmomtcb x1 for pt 

## 2013-11-28 NOTE — Telephone Encounter (Signed)
Pt returned call

## 2013-11-28 NOTE — Telephone Encounter (Signed)
lmomtcb x1 for pt We can give her [employer] application to apply.\if pt has insurance usually they are not approved for this

## 2013-11-28 NOTE — Telephone Encounter (Signed)
Pt returned call.  [name] [name] ° °

## 2013-12-03 ENCOUNTER — Telehealth: Payer: Self-pay | Admitting: Internal Medicine

## 2013-12-03 MED ORDER — FLUTICASONE FUROATE-VILANTEROL 100-25 MCG/INH IN AEPB: 1.0000 | INHALATION_SPRAY | Freq: Every day | RESPIRATORY_TRACT | Status: DC

## 2013-12-03 MED ORDER — AEROCHAMBER MV MISC: Status: DC

## 2013-12-03 NOTE — Telephone Encounter (Signed)
Pt is in lobby.  Pt picked up samples left for her, but would like to speak w/ a nurse regarding the dulera.  [name] [name]

## 2013-12-03 NOTE — Telephone Encounter (Signed)
Called pt home # Husband states pt is on her way up to our office now.  Pt is in lobby now to pick up Aerochamber  Nothing further needed.

## 2013-12-03 NOTE — Telephone Encounter (Signed)
Ok to go back to the [place] she had   Education administrator for use with [employer]

## 2013-12-03 NOTE — Telephone Encounter (Signed)
Offer to replace Anoro with Breo ellipta sample 1 puff then rinse, once daily

## 2013-12-03 NOTE — Telephone Encounter (Signed)
Pt aware of recs. Sample left for pick up. nothin further needed

## 2013-12-03 NOTE — Telephone Encounter (Signed)
Called and spoke with pt and she stated that she would like to just stay on the [place].  She stated that she thought that is why CY changed her from the dulera to anoro.  She stated that if she is still going to have to rinse her mouth out she would just rather go back to the dulera and have this [facility] for the [facility].  CY please advise. Thanks   Last ov--11/26/13 Next ov--no pending appts  Allergies  Allergen Reactions  . Aspirin Shortness Of Breath     Scheduled Meds: Continuous Infusions: PRN Meds:.

## 2013-12-03 NOTE — Telephone Encounter (Signed)
Per OV 6/15/1%; Sample and printed script to try Anoro inhaler   1 puff, once daily, instead of Dulera     See if this helps clear the thrush in your mouth --  Called spoke with pt. She reports the anoro makes her feel bad and noticed a lot of chest tx yesterday. Pt is requesting recs from Dr. Annamaria Boots. Please advise thanks  Allergies  Allergen Reactions  . Aspirin Shortness Of Breath

## 2013-12-04 ENCOUNTER — Telehealth: Payer: Self-pay | Admitting: Internal Medicine

## 2013-12-04 NOTE — Telephone Encounter (Signed)
Spoke with the pt  She is asking for pt assistance form for [facility]  I have left one up front for her to fill out  I advised once she is done return to [place] so we can fill out our part and fax along with rx for [facility]  She verbalized understanding and nothing further needed

## 2013-12-04 NOTE — Telephone Encounter (Signed)
LMTCB

## 2013-12-05 NOTE — Telephone Encounter (Signed)
Ok, will forward to [place] to complete and have CDY sign  Thanks!

## 2013-12-05 NOTE — Telephone Encounter (Signed)
Paperwork completed and given to Meghan to have CY sign and return to me.

## 2013-12-07 NOTE — Telephone Encounter (Signed)
Please advise if this has been done? Thanks

## 2013-12-07 NOTE — Telephone Encounter (Signed)
Left detailed msg informing the pt

## 2013-12-07 NOTE — Telephone Encounter (Signed)
CY has signed and Pt Assistance Forms mailed back to [employer] for [employer]. Copies made for our records as well.

## 2013-12-18 ENCOUNTER — Other Ambulatory Visit: Payer: Self-pay | Admitting: Obstetrics and Gynecology

## 2013-12-18 ENCOUNTER — Telehealth: Payer: Self-pay | Admitting: Internal Medicine

## 2013-12-18 NOTE — Telephone Encounter (Signed)
[email or web address]

## 2013-12-18 NOTE — Telephone Encounter (Signed)
Spoke with [employer]. Joellen [place] stated the copy of the pt assistance forms were in the scan folder and might have been sent to the scan center already. I checked the bin up front and could not locate the form.   Called the scan center on [address] [phone]) and spoke to [place], she informed me that the document is not there. She stated there are receiving another load of papers today at 3p and will call back then call back. Will forward to Katie to make aware and follow up on.

## 2013-12-18 NOTE — Telephone Encounter (Signed)
Pt came by the office with the form she was mailed by [employer]; this is the same form we filled out the first time and mailed to them. We have redone the form and patient is aware that I will make 2 copies of the form prior to sending to [employer](1 for her records and 1 for our records). Forms completed and signed by CY and mailed to [employer]. Nothing more needed at this time.

## 2013-12-18 NOTE — Telephone Encounter (Signed)
Called and spoke to pt. Pt stated that she received forms from [employer] that she needed to fill out and mail back. Called [employer] and was informed that they sent her a form regarding medicare that the pt had to answer and mail back along with the original assistance forms. The pt stated she only received one form and it was not the original assistance forms. [employer] informed me that they will be able to accept the forms since [name] [name] has a copy of the original forms, they stated to mail in the copy and the additional form in together. Informed pt that she will need to drop off the form that she needs to fill out and sign along with the self addressed envelope and will can send it in with the copy of the original pt assistance forms.   Will forward to Katie to make aware.

## 2013-12-18 NOTE — Telephone Encounter (Signed)
Returning a call [phone] ext [phone]

## 2013-12-19 LAB — CYTOLOGY - PAP

## 2013-12-21 ENCOUNTER — Telehealth: Payer: Self-pay | Admitting: Internal Medicine

## 2013-12-21 NOTE — Telephone Encounter (Signed)
Called and spoke with pt and she stated that the pt assistance sent her  Back the first form that she filled out.  Pt stated that this is the same form that she brought over on 7/7 and that [facility] sent in with the second form.  Pt is aware that there is nothing further that she will need to do since all forms were mailed back in for the pt.

## 2013-12-24 ENCOUNTER — Telehealth: Payer: Self-pay | Admitting: Gastroenterology

## 2013-12-24 MED ORDER — HYOSCYAMINE SULFATE 0.125 MG SL SUBL: SUBLINGUAL_TABLET | SUBLINGUAL | Status: DC

## 2013-12-24 NOTE — Telephone Encounter (Signed)
Patient states she has been taking the Levsin always 1-2 tablets by mouth every 4 hours as needed but she just looked at her prescription bottle and it states 1 tablet every 4 hours as needed. Told patient she can take 1-2 tablets if needed and I can send a new prescription that states 1-2 tablets. Pt states that would be great since most days she does need to take 1-2 tablets.

## 2014-01-27 DIAGNOSIS — B370 Candidal stomatitis: Secondary | ICD-10-CM | POA: Insufficient documentation

## 2014-01-27 NOTE — Assessment & Plan Note (Signed)
I don't see polyps today.

## 2014-01-27 NOTE — Assessment & Plan Note (Signed)
Plan- try changing from steroid inhaler Dulera, to Anoro. This will reduce steroid exposure in her mouth

## 2014-01-27 NOTE — Assessment & Plan Note (Signed)
She believes she had a reflux-induced acute bronchitis which was already cleared spontaneously. We emphasized reflux precautions. Plan-try Anoro inhaler instead of [employer]

## 2014-01-28 ENCOUNTER — Telehealth: Payer: Self-pay | Admitting: Internal Medicine

## 2014-01-28 MED ORDER — MOMETASONE FURO-FORMOTEROL FUM 100-5 MCG/ACT IN AERO: 2.0000 | INHALATION_SPRAY | Freq: Two times a day (BID) | RESPIRATORY_TRACT | Status: DC

## 2014-01-28 NOTE — Telephone Encounter (Signed)
She was tried on vilanterol inhalers (Anoro and Breo) but Teacher, adult education. Please remove those from active med list and replace with Dulera 100, 2 puffs through spacer (She has aerochamber), then rinse mouth, twice daily, # 1 and refill prn. Notify this pharmacy that this is what she should be doing now.

## 2014-01-28 NOTE — Telephone Encounter (Signed)
Called spoke with raketta. She reports pt get dulera patient assistance through them. They need clarification if she is too get dulera 153mcg or 200 mcg? Is this 2 puffs bid or puff BID? Pt has had both ways from both strengths through them Please advise Dr. Annamaria Boots thanks

## 2014-01-28 NOTE — Telephone Encounter (Signed)
Spoke with Raketta at [place] of Rx patient is supposed to be using-- Dulera 100 - 2 puff BID x PRN refills Other inhalers (Anoro/Breo) removed from list. Nothing further needed.

## 2014-02-06 ENCOUNTER — Other Ambulatory Visit: Payer: Self-pay | Admitting: Internal Medicine

## 2014-03-11 ENCOUNTER — Telehealth: Payer: Self-pay | Admitting: Internal Medicine

## 2014-03-11 NOTE — Telephone Encounter (Signed)
LMTCB

## 2014-03-12 NOTE — Telephone Encounter (Signed)
Pt returned call & will be available at the work # for another 40 mins.  Can be reached on cell after that at [phone].  [name] [name]

## 2014-03-12 NOTE — Telephone Encounter (Signed)
Per phone note from 01/28/2014 per CY:  She was tried on vilanterol inhalers (Anoro and Breo) but insurance won't cover.  Please remove those from active med list and replace with Dulera 100, 2 puffs through spacer (She has aerochamber), then rinse mouth, twice daily, # 1 and refill prn. Notify this pharmacy that this is what she should be doing now.   i called and lmomtcb for the pt to make her aware.

## 2014-03-12 NOTE — Telephone Encounter (Signed)
[email or web address]

## 2014-03-13 NOTE — Telephone Encounter (Signed)
Spoke with pt -  Pt reports she received Dulera 100 through Patient Assistance.  However, pt states she has been using Dulera 200.  Reports she has been given samples of Dulera 200, has picked up rx from pharm, and has also received Dulera 200 through Pt Assistance.  She would like to clarify with Dr. Annamaria Boots if she should be on the 100 or 200 and if it would be ok to take the [facility] 100 she has just received through Pt Assistance.  Pelase advise. Thank you.

## 2014-03-13 NOTE — Telephone Encounter (Signed)
Ok to use either one- same directions. If she can get the [facility] 100 through patient assistance, then use that.

## 2014-03-13 NOTE — Telephone Encounter (Signed)
Pt returned call [phone] ext [phone]

## 2014-03-13 NOTE — Telephone Encounter (Signed)
LMTCB

## 2014-03-13 NOTE — Telephone Encounter (Signed)
Spoke with the pt and notified of recs per CDY  She verbalized understanding   

## 2014-03-15 ENCOUNTER — Other Ambulatory Visit: Payer: Self-pay | Admitting: Internal Medicine

## 2014-03-19 ENCOUNTER — Telehealth: Payer: Self-pay | Admitting: Internal Medicine

## 2014-03-19 MED ORDER — CILIDINIUM-CHLORDIAZEPOXIDE 2.5-5 MG PO CAPS: ORAL_CAPSULE | ORAL | Status: DC

## 2014-03-19 NOTE — Telephone Encounter (Signed)
Called spoke with [facility] pharmacist Baker Pierini - requesting refill on pt's Librax Last refill was 6.5.15 #90 with 3 refills Verbal authorization given for another #90 with 2 refills >> last ov was 6.15.15 w/ CY and recs to follow up in 4 months.  No pending appts.

## 2014-03-23 IMAGING — CR DG CHEST 2V
2 series · 2 of 2 positions shown · non-contrast
Comparison: 09/19/2009

CLINICAL DATA: Asthma.  Cough.  Preop.

CHEST - 2 VIEW

[view not recorded (1 of 2)]
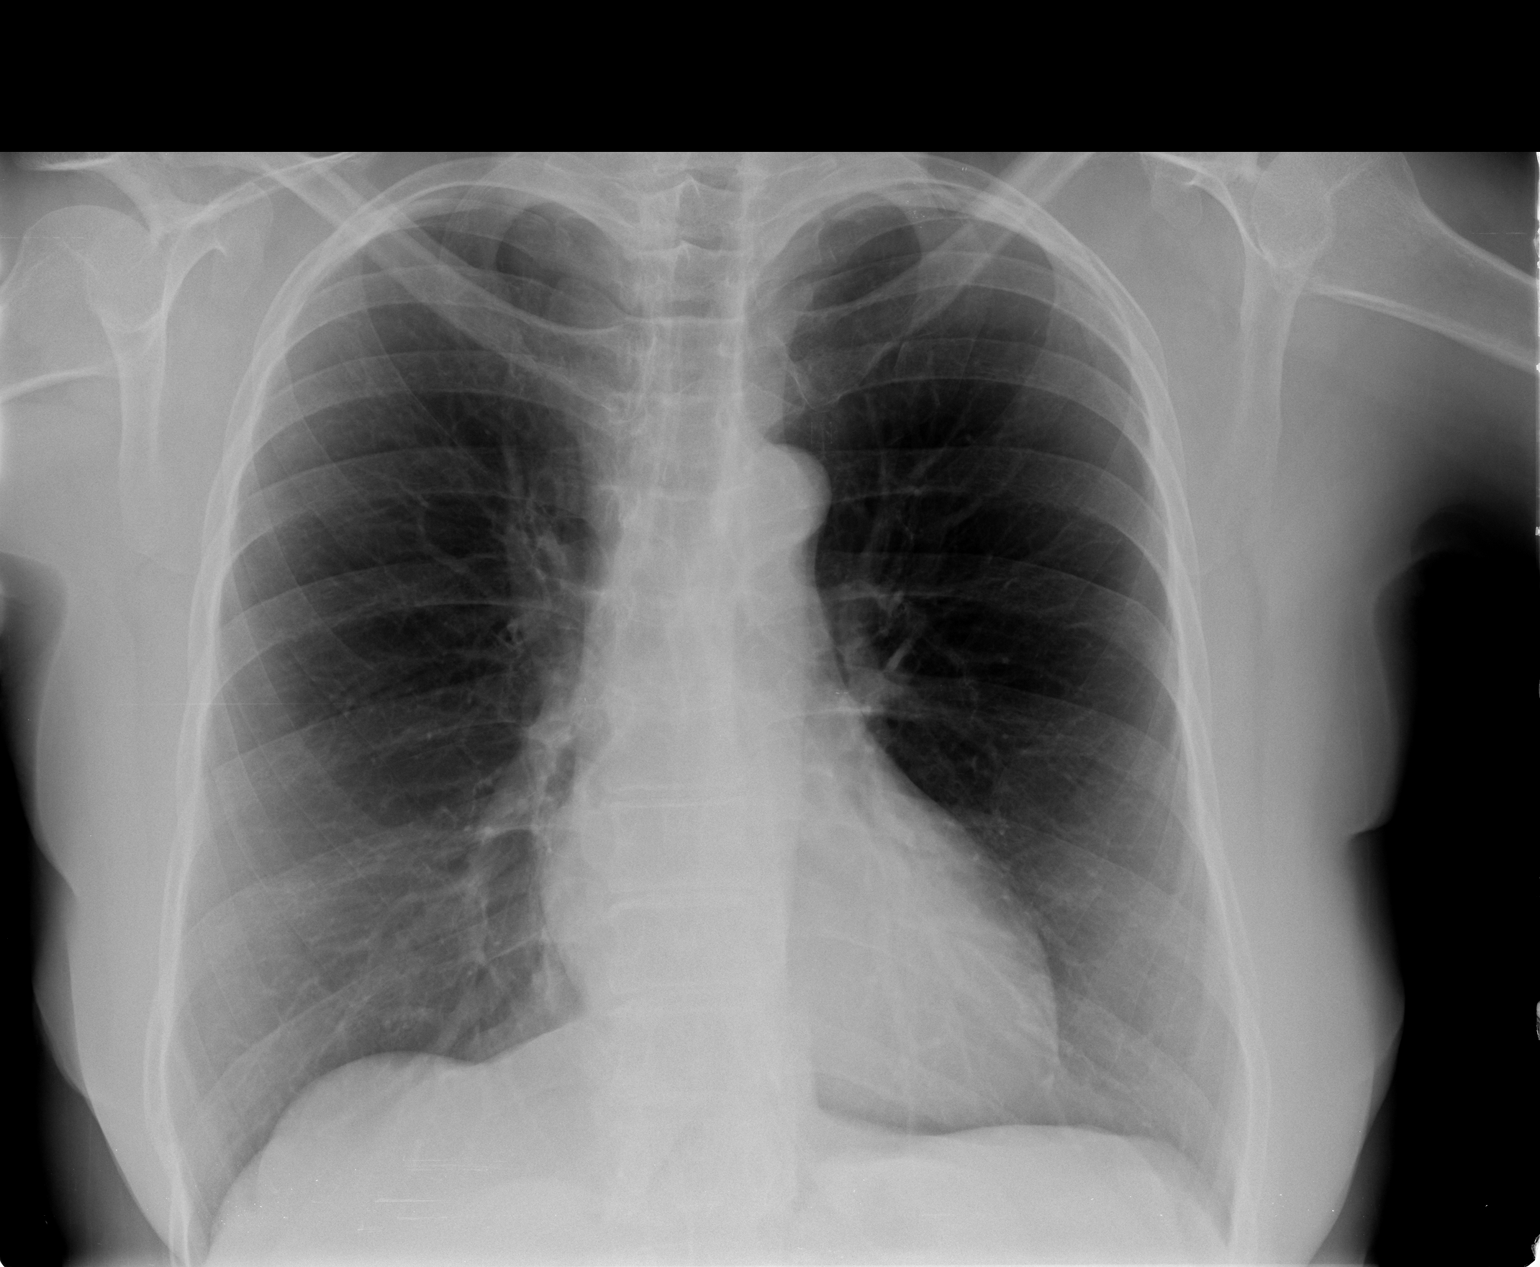

[view not recorded (2 of 2)]
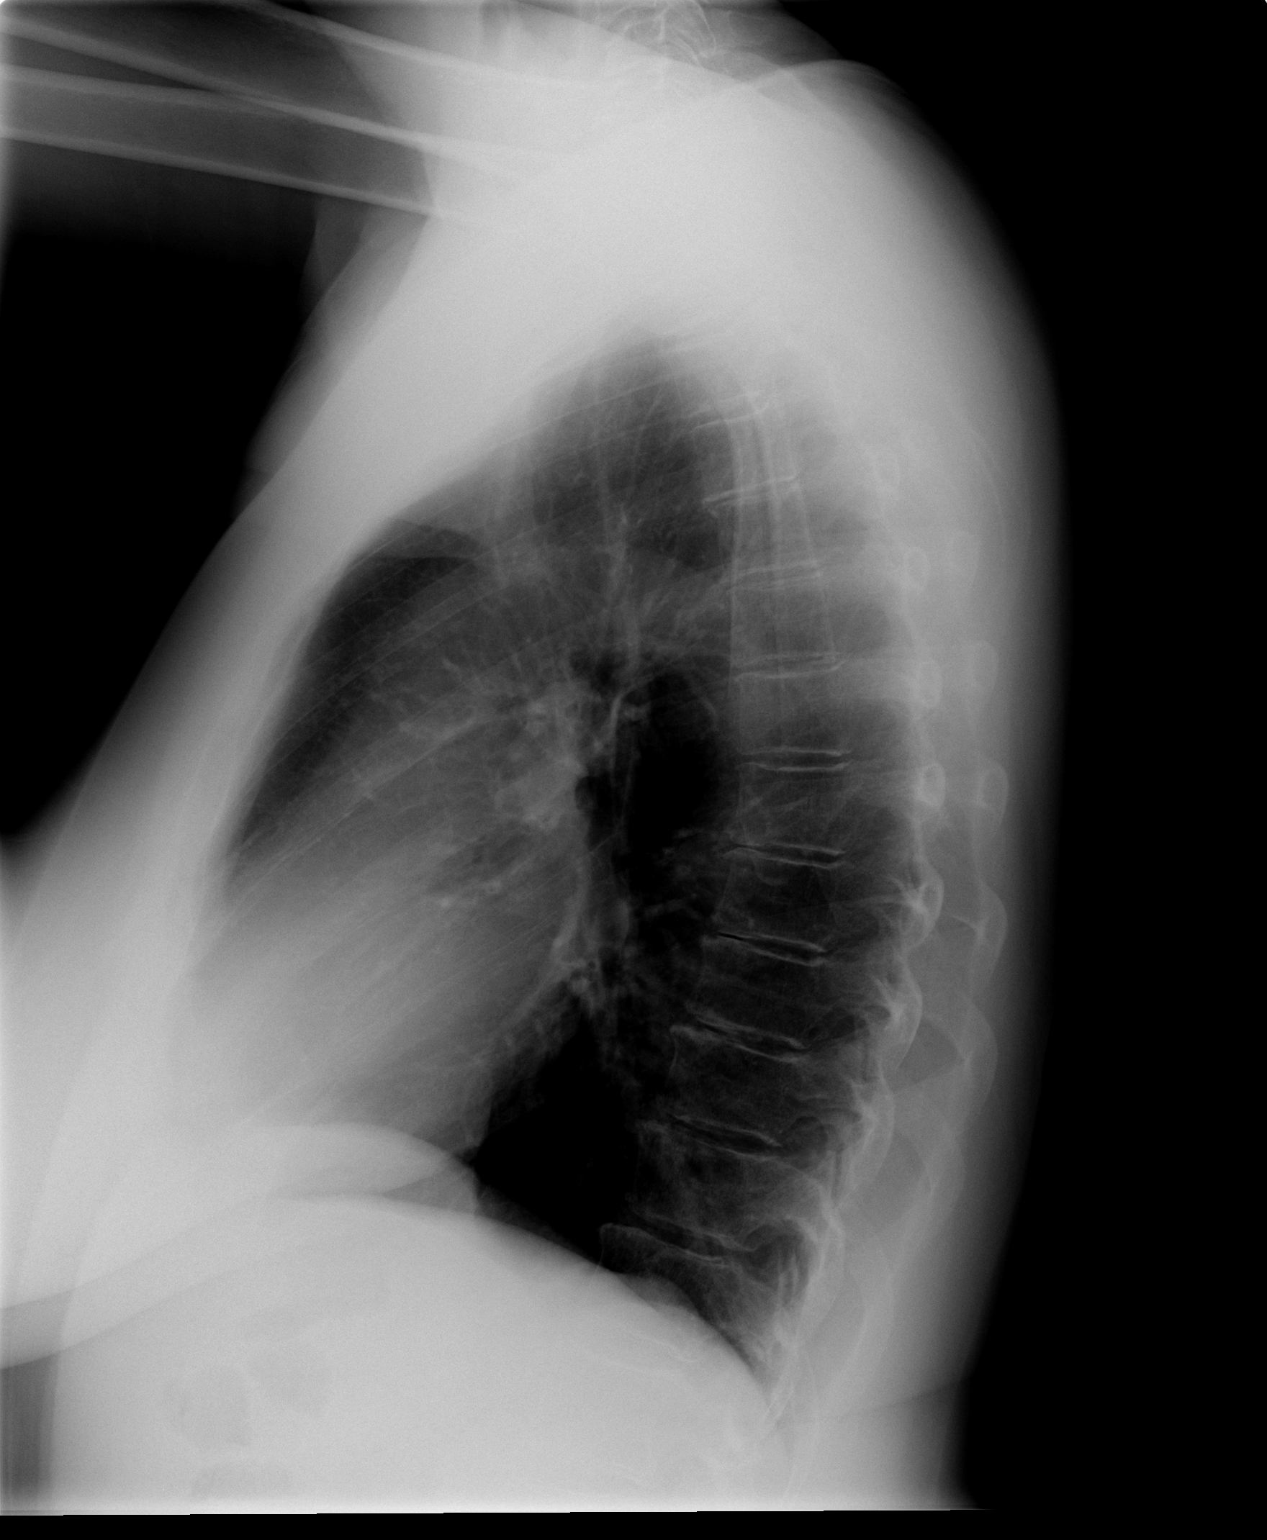

[2 of 2 positions shown; findings below may reference images not displayed]

FINDINGS: Cardiomediastinal silhouette is within normal limits.
The lungs are free of focal consolidations and pleural effusions.
No pulmonary edema. Mild degenerative changes are seen in the mid
thoracic spine.
IMPRESSION: No evidence for acute cardiopulmonary abnormality.

## 2014-04-25 ENCOUNTER — Telehealth: Payer: Self-pay | Admitting: Emergency Medicine

## 2014-04-25 ENCOUNTER — Ambulatory Visit (INDEPENDENT_AMBULATORY_CARE_PROVIDER_SITE_OTHER): Payer: Managed Care, Other (non HMO) | Admitting: Adult Health

## 2014-04-25 ENCOUNTER — Encounter: Payer: Self-pay | Admitting: Adult Health

## 2014-04-25 VITALS — BP 132/86 | HR 99 | Temp 97.1°F | Ht 70.0 in | Wt 177.2 lb

## 2014-04-25 DIAGNOSIS — J449 Chronic obstructive pulmonary disease, unspecified: Secondary | ICD-10-CM

## 2014-04-25 MED ORDER — PREDNISONE 10 MG PO TABS: ORAL_TABLET | ORAL | Status: DC

## 2014-04-25 MED ORDER — METHYLPREDNISOLONE ACETATE 80 MG/ML IJ SUSP
120.0000 mg | Freq: Once | INTRAMUSCULAR | Status: AC
  Administered 2014-04-25: 120 mg via INTRAMUSCULAR

## 2014-04-25 NOTE — Patient Instructions (Signed)
Increase prednisone to 40 mg daily for 3 days, 30 mg daily for 3 days, 20 mg daily for 3 days and then back to 10 mg daily. Mucinex DM twice daily as needed for cough and congestion Saline nasal rinses as needed. Depo-Medrol injection today Follow Dr. Annamaria Boots as planned and as needed Please contact office for sooner follow up if symptoms do not improve or worsen or seek emergency care

## 2014-04-25 NOTE — Progress Notes (Signed)
Subjective:    Patient ID: [name] [name], female    DOB: 01/15/1951, 63 y.o.   MRN: [id number]  HPI 10/14/10- 12 yoF with asthma, allergic rhinitis.  Last here August 31, 2010- Because of problems with GERD and yeast we were hoping to reduce prednisone which she has been on for much of the time since the 1970's. She has maintained mostly on 5 mg daily. We gave 1 mg tabs, but she didn't feel that 4 mg held her.  Heart burn is better and congestion a bit worse. Pollen season usually gets her some congested. She had been on Xolair x 3 years, then wheezed worse and it was stopped.  Today she feels ok, but changes day-to-day. Main trigger for her she thinks is temperature change/ weather. [place] trip - wheezed in [place].   11/13/10- Asthma, allergic rhinitis/ recurrent sinusitis Called last week and we had her take prednisone 10 mg daily x 7 days then go back to 5 mg daily. She says 5 mg isn't enough. Post nasal drip is getting in her lungs and requiring use of rescue inhaler several times daily. Denies dyspnea or headache. Occasional throat clearing of yellow mucus. Little sustained wheeze, just feels tight.  01/11/11- 59 yoF former smoker, followed for asthma, allergic rhinitis/ hx nasal polyps/ aspirin allergy, recurrent sinusitis complicated by GERD Maintenance prednisone was on 5 mg daily, then went up to 40 and now back to 20/day.  Feels fine now, but last week throat tickle progressed into wheeze and she called [place]. [place] home, [place], basement, carpet. Says there was mold , but she can't smell so she can't tell. Carpet was replaced and visible leak fixed. Also aware of reflux- diet controlled and continues Zegerid.   04/13/11- 59 yoF former smoker, followed for asthma, allergic rhinitis/ hx nasal polyps/ aspirin allergy, recurrent sinusitis complicated by GERD And she declines flu vaccine. She tried Xolair injections for 3 years with no benefit. At last visit we tried tapering  prednisone to maintenance 5 mg daily. She developed increasing nasal congestion and postnasal drainage so she went back up to 10 mg of prednisone daily. With that she feels better but she says that is not enough prednisone to restore her sense of smell. Using rescue inhaler about one time daily for tickle and chest tightness. Daily Water Pik for her nose returns some yellow material. She does not feel that she has a sinus infection. She has not had recent followup with Dr. Erik Obey ENT and was to see him again as needed. He had told her her nasal problems were allergy  .+nasal congestion, post nasal drip,  11/16/11-  36 yoF former smoker, followed for asthma, allergic rhinitis/ hx nasal polyps/ aspirin allergy, recurrent sinusitis complicated by GERD She is past the spring seasonal rhinitis time for her. Had a skin rash diagnosed as "allergic" at an urgent care they increase prednisone and gave cortisone cream but she is now back to her maintenance prednisone 10 mg daily. The rash is better and she feels fairly stable. Denies wheeze or cough. Still no sense of smell even on a prednisone 60 mg taper.  03/16/2012 Acute OV  Presents for an acute office visit.  Called in 10/1 with 3 weeks of cough /wheezing, prednisone taper sent  She returns today feeling some better but still has some drainage w/ tickle in throat and throat clearing often.  Went to UC initially  w/ depo x2, pred pak and zpak.  Denies cough, SOB, tightness in chest. Or edema.  Currently healing from Rigth thumb fx in cast  Nasal sprays not helping.   06/01/12- 63 yoF former smoker, followed for asthma, allergic rhinitis/ hx nasal polyps/ aspirin allergy, recurrent sinusitis complicated by GERD folllows for : pt states still having gerd like sxs since having gallbladder surg 05-05-12.  states has been having some sob,wheezing,.throat clearing.chest tightness. denies any cough ,fever, When she returned to her GERD diet after gallbladder  surgery, she began experiencing tightness in throat and upper chest. This feeling comes and goes and has noticed also on waking. She is continued chronic maintenance prednisone 10 mg daily after repeatedly trying to reduce the dose and feeling that her breathing deteriorated. Rescue inhaler has little effect. Minor throat tickle. Denies coughing. She continues omeprazole and Carafate and does not feel heartburn. She does not distinguish well between reflux and asthma symptoms CXR 05/01/12-reviewed IMPRESSION:  No evidence for acute cardiopulmonary abnormality.  Original Report Authenticated By: Nolon Nations, M.D.  Office Spirometry 06/01/12- moderate obstructive airways disease. FVC 3.15/85%, FEV1 1.87/65%, FEV1/FVC 0.59% FEF 25-75% 0.92/36% PFT 02/01/08- we compared her old PFT-mild obstructive airways disease with minimal response to bronchodilator. FVC 4.14/107%, FEV1 2.83/98%, FEV1/FVC 0.68, FEF 25-75% 1.72/56%. Scores have declined.  07/20/12- 61 yoF former smoker, followed for asthma, allergic rhinitis/ hx nasal polyps/ aspirin allergy, recurrent sinusitis complicated by GERD FOLLOWS FOR: review 6Mw and PFT results with patient. She says she is "frustrated" by her shortness of breath, but not coughing. Still on chronic prednisone 10 mg daily. She continues to notice GERD. Albuterol helps chest tightness. She asks primary care referral. PFT 07/20/2012-moderate obstructive airways disease with response to bronchodilator, normal lung volumes, diffusion mildly reduced. FVC 3.87/107%, FEV1 2.37/88%, FEV1/FVC 0.61, FEF 25-75% 1.12/39%. TLC 104%, DLCO 71%. 6MWT 07/20/2012-98%, 97%, 98%, 444 m. Noted hip pain. No oxygen limitation.  08/10/12- 61 yoF former smoker, followed for asthma, allergic rhinitis/ hx nasal polyps/ aspirin allergy, recurrent sinusitis complicated by GERD FOLLOWS FOR: states Dulera is not working now; was not feeling as much chest tightness with use but now is having increased and  worsening tightness in chest; denies any cough or wheezing. She declined home nebulizer saying she had tried it before with no benefit. Echocardiogram 08/03/2012-EF 32-20%, gr 1 diastolic dysfunction. No pulmonary hypertension or wall motion abnormality.  09/12/12- 61 yoF former smoker, followed for asthma, allergic rhinitis/ hx nasal polyps/ aspirin allergy, recurrent sinusitis complicated by GERD FOLLOWS FOR: still having chest tightness. Denies any wheezing or cough. Has recently noticed slight tickly/clearing throat since being around sick co worker. Admits she has been worried about her respiratory prognosis since diagnosed with COPD. Which she was exposed to a friend with a cold, she began using an antibiotic she had a home and Mucinex. She still feels somewhat tight without pain. She failed Spiriva but continues Dulera and remains on prednisone 10 mg daily maintenance which we discussed again. GI give her Levsin for possible esophageal spasm with minimal benefit. Dr. Ronnald Ramp treated with Zoloft but it just made her nervous.  12/21/12-61 yoF former smoker, followed for asthma, allergic rhinitis/ hx nasal polyps/ aspirin allergy, recurrent sinusitis complicated by GERD FOLLOW URK:[facility] tightness better with Librax still on 3/day,denies sob,wheezing, and cough Continues prednisone 10 mg daily and has not felt she could wean off. We discussed steroid side effects again. Had bone density.  Denies shortness of breath, cough or wheeze.  07/27/13- 61 yoF former smoker, followed for asthma, allergic rhinitis/  hx nasal polyps/ aspirin allergy, recurrent sinusitis complicated by GERD FOLLOWS FOR:  Breathing doing well -- Discuss exercising Feeling very much better with less cough and chest tightness. Uses Librax 3 times daily as needed for chest tightness, and occasional Levsin with Protonix twice daily.  11/26/13- 62 yoF former smoker, followed for asthma/ COPD, allergic rhinitis/ hx nasal polyps/ aspirin  allergy, recurrent sinusitis complicated by GERD FOLLOWS FOR: green drainage from sinus' for about 1.5 months. Last night she felt tight in chest (states she had BBQ over the weekend and wonders if GERD flared up her COPD). No headache. Says she feels well now. Stays on prednisone 10 mg daily for lungs. Bone density was checked. Continues Dulera but persistent thrush.  04/25/2014 Acute OV  Former smoker, followed for asthma/ COPD, allergic rhinitis/ hx nasal polyps/ aspirin allergy, recurrent sinusitis complicated by GERD Complains of increased SOB, HA, sneezing, dry cough, tightness x 5 days , Worse this am . Denies f/c/s, chest pain n/v/d, purulent sputum, PND, leg swelling. On prednisone 10mg  daily and Dulera  No recent travel or antibiotic use   ROS-see HPI Constitutional:   No  weight loss, night sweats,  Fevers, chills, fatigue, or  lassitude.  HEENT:   No headaches,  Difficulty swallowing,  Tooth/dental problems, or  Sore throat,                No sneezing, itching, ear ache, + nasal congestion, post nasal drip,   CV:  No chest pain,  Orthopnea, PND, swelling in lower extremities, anasarca, dizziness, palpitations, syncope.   GI  No heartburn, indigestion, abdominal pain, nausea, vomiting, diarrhea, change in bowel habits, loss of appetite, bloody stools.   Resp:    No chest wall deformity  Skin: no rash or lesions.  GU: no dysuria, change in color of urine, no urgency or frequency.  No flank pain, no hematuria   MS:  No joint pain or swelling.  No decreased range of motion.  No back pain.  Psych:  No change in mood or affect. No depression or anxiety.  No memory loss.       Objective:  OBJ- Physical Exam GEN: A/Ox3; pleasant , NAD, well nourished   HEENT:  [place]/AT,  EACs-clear, TMs-wnl, NOSE-clear, THROAT-clear, no lesions, no postnasal drip or exudate noted.   NECK:  Supple w/ fair ROM; no JVD; normal carotid impulses w/o bruits; no thyromegaly or nodules palpated;  no lymphadenopathy.  RESP  Faint exp wheeze no accessory muscle use, no dullness to percussion  CARD:  RRR, no m/r/g  , no peripheral edema, pulses intact, no cyanosis or clubbing.  GI:   Soft & nt; nml bowel sounds; no organomegaly or masses detected.  Musco: Warm bil, no deformities or joint swelling noted.   Neuro: alert, no focal deficits noted.    Skin: Warm, no lesions or rashes

## 2014-04-25 NOTE — Telephone Encounter (Signed)
Spoke with pt and given appt with [name] [name] this morning at 11:00.

## 2014-04-25 NOTE — Telephone Encounter (Signed)
Pt calling a/b appointment she can be reached @ [phone] ext. [phone].[name] [name]

## 2014-04-25 NOTE — Addendum Note (Signed)
Addended by: Virl Cagey on: 04/25/2014 12:04 PM   Modules accepted: Orders

## 2014-04-25 NOTE — Telephone Encounter (Signed)
Pt calls and states that she is having shortness of breath this am, her nose feels occluded, having to breathe through her mouth. She has felt tired and sluggish for two weeks. No fever. She rinses her sinuses daily - yellow discharge no change.   Told pt that we would work to get her an Acute OV today - please call her and try to have her seen 11/12. Thanks

## 2014-04-25 NOTE — Assessment & Plan Note (Signed)
Mild flare with AR   Plan  Increase prednisone to 40 mg daily for 3 days, 30 mg daily for 3 days, 20 mg daily for 3 days and then back to 10 mg daily. Mucinex DM twice daily as needed for cough and congestion Saline nasal rinses as needed. Depo-Medrol injection today Follow Dr. Annamaria Boots as planned and as needed Please contact office for sooner follow up if symptoms do not improve or worsen or seek emergency care

## 2014-04-25 NOTE — Telephone Encounter (Signed)
lmtcb for pt. Spoke with Jess and we can book pt in one of TP's morning or afternoon blocked spots. If pt is not ok with those times will need to see if CY is willing to see pt.

## 2014-06-27 ENCOUNTER — Other Ambulatory Visit: Payer: Self-pay | Admitting: Internal Medicine

## 2014-06-28 NOTE — Telephone Encounter (Signed)
Ok to refill as requested 

## 2014-06-28 NOTE — Telephone Encounter (Signed)
CY Please advise on refill. Thanks.  

## 2014-07-03 ENCOUNTER — Telehealth: Payer: Self-pay | Admitting: Internal Medicine

## 2014-07-03 MED ORDER — AMOXICILLIN-POT CLAVULANATE 875-125 MG PO TABS
1.0000 | ORAL_TABLET | Freq: Two times a day (BID) | ORAL | Status: DC
Start: 2014-07-03 — End: 2014-10-02

## 2014-07-03 NOTE — Telephone Encounter (Signed)
Called and spoke to pt. Pt c/o PND, sore throat and yellow nasal mucus since last night. Pt denies headache, f/c/s, SOB, CP/tightness. Pt stated she is taking Tylernol as needed. Pt last seen 04/25/14 by TP.  Dr. Annamaria Boots please advise.   Allergies  Allergen Reactions  . Aspirin Shortness Of Breath    Current Outpatient Prescriptions on File Prior to Visit  Medication Sig Dispense Refill  . clidinium-chlordiazePOXIDE (LIBRAX) 5-2.5 MG per capsule TAKE ONE CAPSULE BY MOUTH 3 TIMES A DAY AS NEEDED 90 capsule 2  . COMBIVENT RESPIMAT 20-100 MCG/ACT AERS respimat INHALE 1 PUFF INTO THE LUNGS 2 (TWO) TIMES DAILY. 1 Inhaler 3  . conjugated estrogens (PREMARIN) vaginal cream Place 0.5 g vaginally. As directed    . mometasone-formoterol (DULERA) 100-5 MCG/ACT AERO Inhale 2 puffs into the lungs 2 (two) times daily. 3 Inhaler prn  . pantoprazole (PROTONIX) 40 MG tablet TAKE 1 TABLET (40 MG TOTAL) BY MOUTH 2 (TWO) TIMES DAILY. 60 tablet 11  . predniSONE (DELTASONE) 10 MG tablet TAKE 1 TABLET (10 MG TOTAL) BY MOUTH DAILY. 30 tablet 5  . predniSONE (DELTASONE) 10 MG tablet Take 4 tabs po x 3 days, then 3 x 3 days, then 2 x 3 days, then 10mg  daily. 30 tablet 0  . Spacer/Aero-Holding Chambers (AEROCHAMBER MV) inhaler Use as instructed 1 each 0   No current facility-administered medications on file prior to visit.

## 2014-07-03 NOTE — Telephone Encounter (Signed)
lmomtcb x1 

## 2014-07-03 NOTE — Telephone Encounter (Signed)
Pt called back and she is aware of CY recs.  Pt is aware of meds that have been called to the pharmacy and nothing further is needed.

## 2014-07-03 NOTE — Telephone Encounter (Signed)
Ok to offer amoxacillin 875 mg, # 10, 1 twice daily

## 2014-07-08 ENCOUNTER — Telehealth: Payer: Self-pay | Admitting: Internal Medicine

## 2014-07-08 MED ORDER — PREDNISONE 10 MG PO TABS: ORAL_TABLET | ORAL | Status: DC

## 2014-07-08 NOTE — Telephone Encounter (Signed)
pred taper refillled, pt aware.  Nothing further needed.

## 2014-07-08 NOTE — Telephone Encounter (Signed)
Spoke with pt, states that her sinuses were "full", used her extra prednisone rx which has opened her up greatly.  Took 40mg  Saturday and Sunday, 30mg  today. Pt now has lots of PND, sneezing, lots of sinus congestion.  Pt is wanting another rx for prednisone to replenish the extra she has taken.    Last ov:04/25/14 Next ov: none  Dr. Annamaria Boots are you ok with refilling pt's prednisone taper as this is helping her?  Thanks!  Allergies  Allergen Reactions  . Aspirin Shortness Of Breath   Current Outpatient Prescriptions on File Prior to Visit  Medication Sig Dispense Refill  . amoxicillin-clavulanate (AUGMENTIN) 875-125 MG per tablet Take 1 tablet by mouth 2 (two) times daily. 10 tablet 0  . clidinium-chlordiazePOXIDE (LIBRAX) 5-2.5 MG per capsule TAKE ONE CAPSULE BY MOUTH 3 TIMES A DAY AS NEEDED 90 capsule 2  . COMBIVENT RESPIMAT 20-100 MCG/ACT AERS respimat INHALE 1 PUFF INTO THE LUNGS 2 (TWO) TIMES DAILY. 1 Inhaler 3  . conjugated estrogens (PREMARIN) vaginal cream Place 0.5 g vaginally. As directed    . mometasone-formoterol (DULERA) 100-5 MCG/ACT AERO Inhale 2 puffs into the lungs 2 (two) times daily. 3 Inhaler prn  . pantoprazole (PROTONIX) 40 MG tablet TAKE 1 TABLET (40 MG TOTAL) BY MOUTH 2 (TWO) TIMES DAILY. 60 tablet 11  . predniSONE (DELTASONE) 10 MG tablet TAKE 1 TABLET (10 MG TOTAL) BY MOUTH DAILY. 30 tablet 5  . predniSONE (DELTASONE) 10 MG tablet Take 4 tabs po x 3 days, then 3 x 3 days, then 2 x 3 days, then 10mg  daily. 30 tablet 0  . Spacer/Aero-Holding Chambers (AEROCHAMBER MV) inhaler Use as instructed 1 each 0   No current facility-administered medications on file prior to visit.

## 2014-07-08 NOTE — Telephone Encounter (Signed)
Ok to refill her prednisone taper

## 2014-07-23 ENCOUNTER — Telehealth: Payer: Self-pay | Admitting: Internal Medicine

## 2014-07-23 MED ORDER — CEFDINIR 300 MG PO CAPS: 300.0000 mg | ORAL_CAPSULE | Freq: Two times a day (BID) | ORAL | Status: DC

## 2014-07-23 NOTE — Telephone Encounter (Signed)
LMOMTCB x1 for pt 

## 2014-07-23 NOTE — Telephone Encounter (Signed)
Spoke with pt, states she finished her abx and prednisone, as soon as she finished them she began experiencing sinus congestion, runny nose, PND X2-3 weeks.  [facility] [address] [facility] [place]  Last ov: 04/25/14 with TP Next ov: none  Dr Annamaria Boots please advise.  Thank you.   Allergies  Allergen Reactions  . Aspirin Shortness Of Breath   Current Outpatient Prescriptions on File Prior to Visit  Medication Sig Dispense Refill  . amoxicillin-clavulanate (AUGMENTIN) 875-125 MG per tablet Take 1 tablet by mouth 2 (two) times daily. 10 tablet 0  . clidinium-chlordiazePOXIDE (LIBRAX) 5-2.5 MG per capsule TAKE ONE CAPSULE BY MOUTH 3 TIMES A DAY AS NEEDED 90 capsule 2  . COMBIVENT RESPIMAT 20-100 MCG/ACT AERS respimat INHALE 1 PUFF INTO THE LUNGS 2 (TWO) TIMES DAILY. 1 Inhaler 3  . conjugated estrogens (PREMARIN) vaginal cream Place 0.5 g vaginally. As directed    . mometasone-formoterol (DULERA) 100-5 MCG/ACT AERO Inhale 2 puffs into the lungs 2 (two) times daily. 3 Inhaler prn  . pantoprazole (PROTONIX) 40 MG tablet TAKE 1 TABLET (40 MG TOTAL) BY MOUTH 2 (TWO) TIMES DAILY. 60 tablet 11  . predniSONE (DELTASONE) 10 MG tablet TAKE 1 TABLET (10 MG TOTAL) BY MOUTH DAILY. 30 tablet 5  . predniSONE (DELTASONE) 10 MG tablet Take 4 tabs po x 3 days, then 3 x 3 days, then 2 x 3 days, then 10mg  daily. 30 tablet 0  . Spacer/Aero-Holding Chambers (AEROCHAMBER MV) inhaler Use as instructed 1 each 0   No current facility-administered medications on file prior to visit.

## 2014-07-23 NOTE — Telephone Encounter (Signed)
Pt returning call.[name] [name] ° °

## 2014-07-23 NOTE — Telephone Encounter (Signed)
Pt reports still being on maint Prednisone 10mg  daily. Aware that Cefdinir 300mg  has been called into [facility]. Nothing further needed.

## 2014-07-23 NOTE — Telephone Encounter (Signed)
Is she still on maintenance prednisone 10 mg daily? Don't want to have to increase that.  Offer cefdinir 300 mg, # 20, 1 twice daily

## 2014-07-28 ENCOUNTER — Other Ambulatory Visit: Payer: Self-pay | Admitting: Internal Medicine

## 2014-08-07 ENCOUNTER — Telehealth: Payer: Self-pay | Admitting: Internal Medicine

## 2014-08-07 NOTE — Telephone Encounter (Signed)
Left message at home number letting patient know she can come by the office up until 5:30pm to pick up patient assistance [facility]) meds up at front. Nothing more needed.

## 2014-09-14 ENCOUNTER — Other Ambulatory Visit: Payer: Self-pay | Admitting: Internal Medicine

## 2014-09-16 ENCOUNTER — Telehealth: Payer: Self-pay | Admitting: Internal Medicine

## 2014-09-16 NOTE — Telephone Encounter (Signed)
This request has been taken care of. Pt aware. Nothing more needed at this time.

## 2014-09-17 ENCOUNTER — Encounter: Payer: Self-pay | Admitting: Gastroenterology

## 2014-10-02 ENCOUNTER — Encounter: Payer: Self-pay | Admitting: Neurology

## 2014-10-02 ENCOUNTER — Ambulatory Visit (INDEPENDENT_AMBULATORY_CARE_PROVIDER_SITE_OTHER): Payer: Managed Care, Other (non HMO) | Admitting: Neurology

## 2014-10-02 VITALS — BP 140/90 | HR 78 | Resp 14 | Ht 69.0 in | Wt 170.8 lb

## 2014-10-02 DIAGNOSIS — R413 Other amnesia: Secondary | ICD-10-CM | POA: Insufficient documentation

## 2014-10-02 DIAGNOSIS — M810 Age-related osteoporosis without current pathological fracture: Secondary | ICD-10-CM | POA: Diagnosis not present

## 2014-10-02 DIAGNOSIS — F418 Other specified anxiety disorders: Secondary | ICD-10-CM | POA: Diagnosis not present

## 2014-10-02 DIAGNOSIS — J449 Chronic obstructive pulmonary disease, unspecified: Secondary | ICD-10-CM | POA: Diagnosis not present

## 2014-10-02 MED ORDER — ESCITALOPRAM OXALATE 10 MG PO TABS: 10.0000 mg | ORAL_TABLET | Freq: Every day | ORAL | Status: DC

## 2014-10-02 NOTE — Progress Notes (Addendum)
[facility]  PATIENT: [name] [name] DOB: 1950/09/25  REFERRING DOCTOR OR PCP:  [name] [name] SOURCE: patients and records form [facility] records  _________________________________   HISTORICAL  CHIEF COMPLAINT:  Chief Complaint  Patient presents with  . Memory Loss    Sts. memory has been gradually worsening over the last few yrs.  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 coworkers often tell her they have given her instructions that she doesn't remember.  Her husband, [name] [name], [place]. she often forgets little things that he tells her, but "nothng major."  [name] [name] is concerned because of her mother's  hx. of Alzheimer's Dz, sister's hx. of cva's/fim    HISTORY OF PRESENT ILLNESS:  I had the pleasure seeing your patient, [name] [name], at [facility] for neurological consultation regarding her memory difficulties. She is a 64 year old woman who has had some issues with memory over the past year. Specifically she has noted that she has difficulty remembering what people tell her and she sometimes has trouble coming up with the right word.   She has had more difficulty doing her job at work and some of her coworkers have noted that she does not remember when she has been told. She denies any difficulty doing math or organizing.  She has not lost while driving. She has not had difficulty remembering to turn appliances off.    Her mother was in her 69s when she had Alzheimer's disease. Her sister, brother and aunt all had strokes.   She has not had any CT or MRI of the brain.  She has had some more depression over the last year. She has felt a little down and also has had some mood swings. She has not noted any apathy. She has not had crying spells. Many years ago, when her daughter died, she was hospitalized for depression and was on antidepressants for over a year.  She has never been diagnosed with hypothyroidism. She does have COPD and is  on steroids.    She feels that she falls asleep and stays asleep well. Her husband has not noted that she snores or that she has pauses or gasping in her breathing at night.  She has never had a sleep study or overnight oximetry.     REVIEW OF SYSTEMS: Constitutional: No fevers, chills, sweats, or change in appetite Eyes: No visual changes, double vision, eye pain Ear, nose and throat: No hearing loss, ear pain, nasal congestion, sore throat Cardiovascular: No chest pain, palpitations Respiratory: reports shortness of breath and wheezes GastrointestinaI: No nausea, vomiting, diarrhea, abdominal pain, fecal incontinence Genitourinary: No dysuria, urinary retention or frequency.  No nocturia. Musculoskeletal: No neck pain, back pain but notes pain in the hips and knees. . Integumentary: No rash, pruritus, skin lesions Neurological: as above Psychiatric: No depression at this time.  No anxiety Endocrine: No palpitations, diaphoresis, change in appetite, change in weigh or increased thirst Hematologic/Lymphatic: No anemia, purpura, petechiae. Allergic/Immunologic: No itchy/runny eyes, nasal congestion,   She has some seasonal allergies.  ALLERGIES: Allergies  Allergen Reactions  . Aspirin Shortness Of Breath    HOME MEDICATIONS:  Current outpatient prescriptions:  .  clidinium-chlordiazePOXIDE (LIBRAX) 5-2.5 MG per capsule, TAKE ONE CAPSULE BY MOUTH 3 TIMES A DAY AS NEEDED, Disp: 90 capsule, Rfl: 2 .  COMBIVENT RESPIMAT 20-100 MCG/ACT AERS respimat, INHALE 1 PUFF INTO THE LUNGS 2 (TWO) TIMES DAILY., Disp: 4 g, Rfl: 1 .  hyoscyamine (LEVSIN, ANASPAZ) 0.125 MG tablet,  Take by mouth., Disp: , Rfl:  .  mometasone-formoterol (DULERA) 100-5 MCG/ACT AERO, Inhale 2 puffs into the lungs 2 (two) times daily., Disp: 3 Inhaler, Rfl: prn .  pantoprazole (PROTONIX) 40 MG tablet, TAKE 1 TABLET (40 MG TOTAL) BY MOUTH 2 (TWO) TIMES DAILY., Disp: 60 tablet, Rfl: 11 .  predniSONE (DELTASONE) 10 MG  tablet, TAKE 1 TABLET (10 MG TOTAL) BY MOUTH DAILY., Disp: 30 tablet, Rfl: 5 .  Spacer/Aero-Holding Chambers (AEROCHAMBER MV) inhaler, Use as instructed, Disp: 1 each, Rfl: 0  PAST MEDICAL HISTORY: Past Medical History  Diagnosis Date  . Polyp of nasal cavity   . Extrinsic asthma, unspecified   . Allergic rhinitis, cause unspecified     failed allergy vaccine  . Unspecified sinusitis (chronic)   . 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 S/P dilatation of esophageal stricture 08-2008  . Hiatal hernia   . Diverticulosis   . PONV (postoperative nausea and vomiting)   . Anxiety     panic attacks  . GERD (gastroesophageal reflux disease)   . Arthritis   . Depression   . Vision abnormalities     PAST SURGICAL HISTORY: Past Surgical History  Procedure Laterality Date  . Nasal polyp surgery      x 3   . Tonsillectomy    . Bunionectomy      left foot  . Colonoscopy w/ biopsies and polypectomy    . Thumb fusion  10/13    rt hand  pinned but pin removed  . Cholecystectomy  05/05/2012    Procedure: LAPAROSCOPIC CHOLECYSTECTOMY WITH INTRAOPERATIVE CHOLANGIOGRAM;  Surgeon: Gayland Curry, MD,FACS;  Location: [facility];  Service: General;  Laterality: N/A;  . Polypectomy    . Colonoscopy      FAMILY HISTORY: Family History  Problem Relation Age of Onset  . Colon cancer Maternal Grandfather     unsure age of onset   . Cancer Maternal Grandfather     colon  . Stroke Maternal Aunt   . Heart disease Father     Vague history  . Prostate cancer Brother     SOCIAL HISTORY:  History   Social History  . Marital Status: Married    Spouse Name: N/A  . Number of Children: 2  . Years of Education: N/A   Occupational History  . Theatre manager   .     Social History Main Topics  . Smoking status: Former Smoker -- 0.50 packs/day for 0 years    Types: Cigarettes    Quit date: 06/14/1988  . Smokeless tobacco: Never Used  . Alcohol Use: No  . Drug Use: No  . Sexual  Activity: Yes    Birth Control/ Protection: Post-menopausal   Other Topics Concern  . Not on file   Social History Narrative   Lives with husband.       PHYSICAL EXAM  Filed Vitals:   10/02/14 1500  BP: 140/90  Pulse: 78  Resp: 14  Height: 5' 9"  (1.753 m)  Weight: 170 lb 12.8 oz (77.474 kg)    Body mass index is 25.21 kg/(m^2).   General: The patient is well-developed and well-nourished and in no acute distress  Eyes:  Funduscopic exam shows normal optic discs and retinal vessels.  Neck: The neck is supple, no carotid bruits are noted.  The neck is nontender.  Cardiovascular: The heart has a regular rate and rhythm with a normal S1 and S2. There were no murmurs, gallops or rubs. Lungs are  clear to auscultation.  Skin: Extremities are without significant edema.  Musculoskeletal:  Back is nontender  Neurologic Exam  Mental status: The patient is alert and oriented x 3 at the time of the examination. The patient has reduced recent (1/3 without prompt; 2/3 with category prompt and 3/3 with list prompt) and good remote memory, with a mildly reduced  attention span and concentration ability ([id number]; WORLD-DLROW).  Box is drawn well.  Clock is numbered well (but error in hand placement).   Speech is normal.    She scored a 24/30 on the [place] cognitive assessment (MOCA) consistent with mild cognitive impairment. The data will be scanned into her chart.  Cranial nerves: Extraocular movements are full. Pupils are equal, round, and reactive to light and accomodation.  Visual fields are full.  Facial symmetry is present. There is good facial sensation to soft touch bilaterally.Facial strength is normal.  Trapezius and sternocleidomastoid strength is normal. No dysarthria is noted.  The tongue is midline, and the patient has symmetric elevation of the soft palate. No obvious hearing deficits are noted.  Motor:  Muscle bulk is normal.   Tone is normal. Strength is  5 / 5 in all  4 extremities.   Sensory: Sensory testing is intact to pinprick, soft touch and vibration sensation in her arms. Decreased vibration sensation in the toes..  Coordination: Cerebellar testing reveals good finger-nose-finger and heel-to-shin bilaterally.  Gait and station: Station is normal.   Gait is normal. Tandem gait is minimally wide. Romberg is negative.   Reflexes: Deep tendon reflexes are symmetric and 2+ in the arms and 3+ in the legs bilaterally.   Plantar responses are flexor.      DIAGNOSTIC DATA (LABS, IMAGING, TESTING) - I reviewed patient records, labs, notes, testing and imaging myself where available.  Lab Results  Component Value Date   WBC 7.3 09/01/2012   HGB 12.0 09/01/2012   HCT 36.8 09/01/2012   MCV 81.2 09/01/2012   PLT 293 09/01/2012      Component Value Date/Time   NA 141 09/01/2012 1542   K 4.0 09/01/2012 1542   CL 104 09/01/2012 1542   CO2 30 09/01/2012 1542   GLUCOSE 94 09/01/2012 1542   BUN 14 09/01/2012 1542   CREATININE 1.0 09/01/2012 1542   CALCIUM 9.2 09/01/2012 1542   PROT 6.7 09/01/2012 1542   ALBUMIN 3.8 09/01/2012 1542   AST 18 09/01/2012 1542   ALT 15 09/01/2012 1542   ALKPHOS 76 09/01/2012 1542   BILITOT 0.5 09/01/2012 1542   GFRNONAA 66* 05/01/2012 0823   GFRAA 76* 05/01/2012 0823   Lab Results  Component Value Date   CHOL 228* 09/01/2012   HDL 60.00 09/01/2012   LDLDIRECT 146.4 09/01/2012   TRIG 167.0* 09/01/2012   CHOLHDL 4 09/01/2012   Lab Results  Component Value Date   HGBA1C 5.7 09/01/2012   No results found for: VITAMINB12 Lab Results  Component Value Date   TSH 1.31 09/01/2012       ASSESSMENT AND PLAN  Memory difficulties - Plan: Vitamin B12, Sedimentation Rate, MR Brain Wo Contrast, Comprehensive metabolic panel, TSH  Depression with anxiety - Plan: TSH  Moderate COPD (chronic obstructive pulmonary disease) - Plan: Vitamin B12, Sedimentation Rate, Comprehensive metabolic panel, TSH  Osteoporosis  - Plan: Comprehensive metabolic panel, TSH   In summary, Khadijah Mastrianni is a 64 year old woman who has had more difficulty with memory over the past year. Today, she scored a 24/30 on the [facility] cognitive exam.  This is in the mild cognitive impairment range. I discussed with her and her husband that re-problems and mild cognitive impairment could be due to many different things. The fact that her memory was much better with prompts than without prompts and that she was able to draw a complex figure and plan how to place the numbers in the clock would be more consistent with a secondary cognitive dysfunction than with a primary disorder such as Alzheimer's.  Specifically, I am concerned that her depression may be making her memory worse. I will place her on citalopram I would also check B12, ESR and a metabolic panel to make sure that there is no other derangement contributing to her memory problems.   I will check an MRI of the brain to rule out a structural problem, multi-infarcts or hydrocephalus.   Turn to see me in 2 months. If her workup is negative and she is not better at the follow-up visit I will also consider getting an overnight oximetry and consider a full sleep study based on the results. If she continues to decline, Alzheimer's would be more likely and then we would need to consider treatment.  She will return in 2 months or sooner if she has new or worsening neurologic symptoms.   Kaleiah Kutzer A. Felecia Shelling, MD, PhD 2/37/0230, 1:72 PM Certified in Neurology, Clinical Neurophysiology, Sleep Medicine, Pain Medicine and Neuroimaging  [facility] [address], [place], [place] [address] [phone]

## 2014-10-03 ENCOUNTER — Telehealth: Payer: Self-pay | Admitting: *Deleted

## 2014-10-03 LAB — COMPREHENSIVE METABOLIC PANEL
ALBUMIN: 4.3 g/dL (ref 3.6–4.8)
ALK PHOS: 87 IU/L (ref 39–117)
ALT: 9 IU/L (ref 0–32)
AST: 13 IU/L (ref 0–40)
Albumin/Globulin Ratio: 1.9 (ref 1.1–2.5)
BUN / CREAT RATIO: 14 (ref 11–26)
BUN: 14 mg/dL (ref 8–27)
Bilirubin Total: <0.2 mg/dL (ref 0.0–1.2)
CO2: 25 mmol/L (ref 18–29)
CREATININE: 1.01 mg/dL — AB (ref 0.57–1.00)
Calcium: 9.3 mg/dL (ref 8.7–10.3)
Chloride: 102 mmol/L (ref 97–108)
GFR calc non Af Amer: 59 mL/min/{1.73_m2} — ABNORMAL LOW (ref >59)
GFR, EST AFRICAN AMERICAN: 68 mL/min/{1.73_m2} (ref >59)
GLOBULIN, TOTAL: 2.3 g/dL (ref 1.5–4.5)
GLUCOSE: 81 mg/dL (ref 65–99)
Potassium: 4.2 mmol/L (ref 3.5–5.2)
Sodium: 143 mmol/L (ref 134–144)
TOTAL PROTEIN: 6.6 g/dL (ref 6.0–8.5)

## 2014-10-03 LAB — TSH: TSH: 1.21 u[IU]/mL (ref 0.450–4.500)

## 2014-10-03 LAB — VITAMIN B12: Vitamin B-12: 507 pg/mL (ref 211–946)

## 2014-10-03 LAB — SEDIMENTATION RATE: Sed Rate: 7 mm/hr (ref 0–40)

## 2014-10-03 NOTE — Telephone Encounter (Signed)
-----   Message from Britt Bottom, MD sent at 10/03/2014 12:46 PM EDT ----- Please let them know lab are ok,    They will be contacted again after [facility]

## 2014-10-03 NOTE — Telephone Encounter (Signed)
LMOM for [name] [name] to call/fim

## 2014-10-04 NOTE — Telephone Encounter (Signed)
duplicate task.  I have already spoken with [name] [name]/[name] [name]

## 2014-10-04 NOTE — Telephone Encounter (Signed)
-----   Message from Britt Bottom, MD sent at 10/03/2014 12:46 PM EDT ----- Please let them know lab are ok,    They will be contacted again after [facility]

## 2014-10-04 NOTE — Telephone Encounter (Signed)
Spoke with [name] [name] and per RAS, advised that labs are ok--once she has had her mri and RAS has read it, I will call her with those results/fim

## 2014-10-04 NOTE — Telephone Encounter (Signed)
Patient called/returnig Faith's call from 10/03/14. Pt's c/b [phone] x [phone]

## 2014-10-05 ENCOUNTER — Other Ambulatory Visit: Payer: Self-pay | Admitting: Internal Medicine

## 2014-10-07 NOTE — Telephone Encounter (Signed)
Last OV: 04-25-2014 with TP; no pending appts. CY Please advise on refill. Thanks.

## 2014-10-07 NOTE — Telephone Encounter (Signed)
Ok to refill, but please set f/u ROV sometime next few months

## 2014-10-09 ENCOUNTER — Telehealth: Payer: Self-pay | Admitting: Internal Medicine

## 2014-10-09 MED ORDER — CILIDINIUM-CHLORDIAZEPOXIDE 2.5-5 MG PO CAPS: ORAL_CAPSULE | ORAL | Status: DC

## 2014-10-09 NOTE — Telephone Encounter (Signed)
lmtcb for pt. Rx signed and faxed to [facility].

## 2014-10-09 NOTE — Telephone Encounter (Signed)
I thought I recently okay'd refill and asked routine ov sometime this summer. Anyway- that is ok to do.

## 2014-10-09 NOTE — Telephone Encounter (Signed)
Last OV 11/26/13 No pending OV at this time Last refill 06/28/14  CY - please advise on refill. Thanks.

## 2014-10-10 NOTE — Telephone Encounter (Signed)
[phone] [id number], pt cb

## 2014-10-10 NOTE — Telephone Encounter (Signed)
Pt returning call.[name] [name] ° °

## 2014-10-10 NOTE — Telephone Encounter (Signed)
Lmtcb x 2

## 2014-10-10 NOTE — Telephone Encounter (Signed)
lmomtcb x1 for pt to make aware of CDY response

## 2014-10-10 NOTE — Telephone Encounter (Signed)
°  Pt can be reached on cell # [phone].[name] [name]

## 2014-10-10 NOTE — Telephone Encounter (Signed)
lmomtcb x1 for pt on cell She just needs appt scheduled as RX has been faxed to pharm

## 2014-10-11 ENCOUNTER — Telehealth: Payer: Self-pay | Admitting: Neurology

## 2014-10-11 NOTE — Telephone Encounter (Signed)
Pt is calling wanting to know how long does she have to take escitalopram (LEXAPRO) 10 MG tablet before she notices any difference or change.  Please call and advise.

## 2014-10-11 NOTE — Telephone Encounter (Signed)
Next open ov with CY is 12/13/14.  Pt scheduled for this time.  Nothing further needed.

## 2014-10-11 NOTE — Telephone Encounter (Signed)
Pt returned call [phone] ext [phone]

## 2014-10-11 NOTE — Telephone Encounter (Signed)
LMTC./fim 

## 2014-10-14 MED ORDER — ESCITALOPRAM OXALATE 20 MG PO TABS: 20.0000 mg | ORAL_TABLET | Freq: Every day | ORAL | Status: DC

## 2014-10-14 NOTE — Telephone Encounter (Signed)
Patient called/returning Faith's call from 10/11/14. Patient can be reached @ [phone] X [phone]

## 2014-10-14 NOTE — Telephone Encounter (Signed)
I have spoken with [name] [name] and per RAS, advised that it would take several wks--around a month to notice benefits from Lexapro.  However, if she is tolerating 10mg  daily well, it is ok to increase to 20mg  daily.  [name] verbalized understanding of same, sts. she is tolerating med well, and is agreeable to increasing.  [facility] per her request/fim

## 2014-11-10 ENCOUNTER — Other Ambulatory Visit: Payer: Self-pay | Admitting: Internal Medicine

## 2014-11-16 ENCOUNTER — Other Ambulatory Visit: Payer: Self-pay | Admitting: Gastroenterology

## 2014-11-20 ENCOUNTER — Telehealth: Payer: Self-pay | Admitting: Neurology

## 2014-11-20 NOTE — Telephone Encounter (Signed)
LMTC./fim 

## 2014-11-20 NOTE — Telephone Encounter (Signed)
Patient called/returning Faith's call. Please call and advise. Patient can be reached @ [phone]

## 2014-11-20 NOTE — Telephone Encounter (Signed)
Patient is calling to discuss Rx escitalopram (LEXAPRO) 20 MG tablet. Patient has been taking the medication for about 2 months and cannot see any improvement.  Please call to discuss. Thank you.

## 2014-11-21 NOTE — Telephone Encounter (Signed)
LMTC./fim 

## 2014-11-21 NOTE — Telephone Encounter (Signed)
I have spoken with Noreen this morning.  She does not feel Lexapro is helping. I have given her a sooner appt to discuss med change--she will come in on 11-26-14 at 1620/fim

## 2014-11-21 NOTE — Telephone Encounter (Signed)
Patient returned call. PC&A.

## 2014-11-26 ENCOUNTER — Encounter: Payer: Self-pay | Admitting: Neurology

## 2014-11-26 ENCOUNTER — Ambulatory Visit (INDEPENDENT_AMBULATORY_CARE_PROVIDER_SITE_OTHER): Payer: Managed Care, Other (non HMO) | Admitting: Neurology

## 2014-11-26 VITALS — BP 136/80 | HR 78 | Resp 14 | Ht 69.0 in | Wt 164.0 lb

## 2014-11-26 DIAGNOSIS — R413 Other amnesia: Secondary | ICD-10-CM | POA: Diagnosis not present

## 2014-11-26 DIAGNOSIS — F418 Other specified anxiety disorders: Secondary | ICD-10-CM

## 2014-11-26 MED ORDER — ESCITALOPRAM OXALATE 10 MG PO TABS: 10.0000 mg | ORAL_TABLET | Freq: Every day | ORAL | Status: DC

## 2014-11-26 NOTE — Progress Notes (Signed)
[facility]  PATIENT: [name] [name] DOB: [birth year]  REFERRING DOCTOR OR PCP:  Ricke Hey SOURCE: patients and records form [facility] records  _________________________________   HISTORICAL  CHIEF COMPLAINT:  Chief Complaint  Patient presents with  . Memory Loss    Vienne sts. no improvement in memory with Citalopram 10mg  daily.  She also tried increasing Citalopram to 20mg  daily for 2 weeks and saw no improvement--"it just made me sleepy.'  Labwork was normal.  Sts. she has not had mri./fim    HISTORY OF PRESENT ILLNESS:  [name] [name] is a 64 yo woman who report memory decline.   Specifically she has noted that she has difficulty remembering what people tell her and she sometimes has trouble coming up with the right word.   She has had more difficulty doing her job at work and some of her coworkers have noted that she does not remember when she has been told. She denies any difficulty doing math or organizing.  She has not lost while driving. She has not had difficulty remembering to turn appliances off.    Her mother was in her 29s when she had Alzheimer's disease. Her sister, brother and aunt all had strokes.   She has not had any CT or MRI of the brain.  She has not done the MRI as it would be very expensive.    Mood:   She has had some more depression over the last year.  She has felt a little down and also has had some mood swings. She started citalopram but has not noted any difficulty with mood.    She has not noted any apathy. She has not had crying spells. Many years ago, when her daughter died, she had a severe depression requiring hospitalization and antidepressants for over a year.   Sleep:    She feels that she falls asleep and stays asleep well. She has not been noted to snore or gasp for air at night.   REVIEW OF SYSTEMS: Constitutional: No fevers, chills, sweats, or change in appetite Eyes: No visual changes, double vision, eye pain Ear,  nose and throat: No hearing loss, ear pain, nasal congestion, sore throat Cardiovascular: No chest pain, palpitations Respiratory: reports shortness of breath and wheezes GastrointestinaI: No nausea, vomiting, diarrhea, abdominal pain, fecal incontinence Genitourinary: No dysuria, urinary retention or frequency.  No nocturia. Musculoskeletal: No neck pain, back pain but notes pain in the hips and knees. . Integumentary: No rash, pruritus, skin lesions Neurological: as above Psychiatric: No depression at this time.  No anxiety Endocrine: No palpitations, diaphoresis, change in appetite, change in weigh or increased thirst Hematologic/Lymphatic: No anemia, purpura, petechiae. Allergic/Immunologic: No itchy/runny eyes, nasal congestion,   She has some seasonal allergies.  ALLERGIES: Allergies  Allergen Reactions  . Aspirin Shortness Of Breath    HOME MEDICATIONS:  Current outpatient prescriptions:  .  clidinium-chlordiazePOXIDE (LIBRAX) 5-2.5 MG per capsule, TAKE ONE CAPSULE BY MOUTH 3 TIMES A DAY AS NEEDED, Disp: 90 capsule, Rfl: 2 .  COMBIVENT RESPIMAT 20-100 MCG/ACT AERS respimat, INHALE 1 PUFF INTO THE LUNGS 2 (TWO) TIMES DAILY., Disp: 4 g, Rfl: 0 .  escitalopram (LEXAPRO) 10 MG tablet, , Disp: , Rfl:  .  hyoscyamine (LEVSIN, ANASPAZ) 0.125 MG tablet, Take by mouth., Disp: , Rfl:  .  mometasone-formoterol (DULERA) 100-5 MCG/ACT AERO, Inhale 2 puffs into the lungs 2 (two) times daily., Disp: 3 Inhaler, Rfl: prn .  pantoprazole (PROTONIX) 40 MG tablet, TAKE 1 TABLET (  40 MG TOTAL) BY MOUTH 2 (TWO) TIMES DAILY., Disp: 60 tablet, Rfl: 10 .  predniSONE (DELTASONE) 10 MG tablet, TAKE 1 TABLET (10 MG TOTAL) BY MOUTH DAILY., Disp: 30 tablet, Rfl: 5 .  Spacer/Aero-Holding Chambers (AEROCHAMBER MV) inhaler, Use as instructed, Disp: 1 each, Rfl: 0  PAST MEDICAL HISTORY: Past Medical History  Diagnosis Date  . Polyp of nasal cavity   . Extrinsic asthma, unspecified   . Allergic rhinitis,  cause unspecified     failed allergy vaccine  . Unspecified sinusitis (chronic)   . 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 S/P dilatation of esophageal stricture 08-2008  . Hiatal hernia   . Diverticulosis   . PONV (postoperative nausea and vomiting)   . Anxiety     panic attacks  . GERD (gastroesophageal reflux disease)   . Arthritis   . Depression   . Vision abnormalities     PAST SURGICAL HISTORY: Past Surgical History  Procedure Laterality Date  . Nasal polyp surgery      x 3   . Tonsillectomy    . Bunionectomy      left foot  . Colonoscopy w/ biopsies and polypectomy    . Thumb fusion  10/13    rt hand  pinned but pin removed  . Cholecystectomy  05/05/2012    Procedure: LAPAROSCOPIC CHOLECYSTECTOMY WITH INTRAOPERATIVE CHOLANGIOGRAM;  Surgeon: Gayland Curry, MD,FACS;  Location: [facility];  Service: General;  Laterality: N/A;  . Polypectomy    . Colonoscopy      FAMILY HISTORY: Family History  Problem Relation Age of Onset  . Colon cancer Maternal Grandfather     unsure age of onset   . Cancer Maternal Grandfather     colon  . Stroke Maternal Aunt   . Heart disease Father     Vague history  . Prostate cancer Brother     SOCIAL HISTORY:  History   Social History  . Marital Status: Married    Spouse Name: N/A  . Number of Children: 2  . Years of Education: N/A   Occupational History  . Theatre manager   .     Social History Main Topics  . Smoking status: Former Smoker -- 0.50 packs/day for 0 years    Types: Cigarettes    Quit date: 06/14/1988  . Smokeless tobacco: Never Used  . Alcohol Use: No  . Drug Use: No  . Sexual Activity: Yes    Birth Control/ Protection: Post-menopausal   Other Topics Concern  . Not on file   Social History Narrative   Lives with husband.       PHYSICAL EXAM  Filed Vitals:   11/26/14 1633  BP: 136/80  Pulse: 78  Resp: 14  Height: 5\' 9"  (1.753 m)  Weight: 164 lb (74.39 kg)    Body mass index is  24.21 kg/(m^2).   General: The patient is well-developed and well-nourished and in no acute distress   Neurologic Exam  Mental status: The patient is alert and oriented x 3 at the time of the examination. The patient has reduced recent (3/3 without prompt) and good remote memory, with a mildly reduced  attention span and concentration ability ([id number]; WORLD-DLROW).  Box is drawn well.  Clock is numbered well .   Speech is normal.     Cranial nerves: Extraocular movements are full.   Facial symmetry is present. There is good facial sensation to soft touch bilaterally.Facial strength is normal.  Trapezius and sternocleidomastoid strength  is normal. No dysarthria is noted.  The tongue is midline, and the patient has symmetric elevation of the soft palate.    Motor:  Muscle bulk is normal.   Tone is normal. Strength is  5 / 5 in all 4 extremities.   Sensory: Sensory testing is intact to touch,. Decreased vibration sensation in the toes..  Coordination: Cerebellar testing reveals good finger-nose-finger and heel-to-shin bilaterally.  Gait and station: Station is normal.   Gait is normal. Tandem gait is minimally wide.    Reflexes: Deep tendon reflexes are symmetric and 2+ in the arms and 3+ in the legs bilaterally.        DIAGNOSTIC DATA (LABS, IMAGING, TESTING) - I reviewed patient records, labs, notes, testing and imaging myself where available.  Lab Results  Component Value Date   WBC 7.3 09/01/2012   HGB 12.0 09/01/2012   HCT 36.8 09/01/2012   MCV 81.2 09/01/2012   PLT 293 09/01/2012      Component Value Date/Time   NA 143 10/02/2014 1548   NA 141 09/01/2012 1542   K 4.2 10/02/2014 1548   CL 102 10/02/2014 1548   CO2 25 10/02/2014 1548   GLUCOSE 81 10/02/2014 1548   GLUCOSE 94 09/01/2012 1542   BUN 14 10/02/2014 1548   BUN 14 09/01/2012 1542   CREATININE 1.01* 10/02/2014 1548   CALCIUM 9.3 10/02/2014 1548   PROT 6.6 10/02/2014 1548   PROT 6.7 09/01/2012 1542    ALBUMIN 3.8 09/01/2012 1542   AST 13 10/02/2014 1548   ALT 9 10/02/2014 1548   ALKPHOS 87 10/02/2014 1548   BILITOT <0.2 10/02/2014 1548   BILITOT 0.5 09/01/2012 1542   GFRNONAA 59* 10/02/2014 1548   GFRAA 68 10/02/2014 1548   Lab Results  Component Value Date   CHOL 228* 09/01/2012   HDL 60.00 09/01/2012   LDLDIRECT 146.4 09/01/2012   TRIG 167.0* 09/01/2012   CHOLHDL 4 09/01/2012   Lab Results  Component Value Date   HGBA1C 5.7 09/01/2012   Lab Results  Component Value Date   VITAMINB12 507 10/02/2014   Lab Results  Component Value Date   TSH 1.210 10/02/2014       ASSESSMENT AND PLAN  Memory difficulties - Plan: CT Head Wo Contrast  Depression with anxiety - Plan: CT Head Wo Contrast   1.   We will check a CT scan check for structural changes that could be leading to her decline. 2.   Continue escitalopram.  3.  Based on the results the CT scan, we may want to consider Aricept or similar medication.  Alternatively, there is no atrophy at all, we may want to wait until her next visit to see if there has been any further decline in cognition.  She will return in 4 months or sooner if she has new or worsening neurologic symptoms.   Joss Mcdill A. Felecia Shelling, MD, PhD 0/02/3817, 2:99 PM Certified in Neurology, Clinical Neurophysiology, Sleep Medicine, Pain Medicine and Neuroimaging  [facility] [address] [place], [place] [address] [phone]

## 2014-12-03 ENCOUNTER — Ambulatory Visit: Payer: Managed Care, Other (non HMO) | Admitting: Neurology

## 2014-12-10 ENCOUNTER — Ambulatory Visit
Admission: RE | Admit: 2014-12-10 | Discharge: 2014-12-10 | Disposition: A | Discharge status: Home / Self Care | Payer: Managed Care, Other (non HMO) | Source: Ambulatory Visit | Attending: Neurology | Admitting: Neurology

## 2014-12-10 DIAGNOSIS — R413 Other amnesia: Secondary | ICD-10-CM | POA: Diagnosis not present

## 2014-12-10 DIAGNOSIS — F418 Other specified anxiety disorders: Secondary | ICD-10-CM

## 2014-12-12 ENCOUNTER — Telehealth: Payer: Self-pay | Admitting: *Deleted

## 2014-12-12 NOTE — Telephone Encounter (Signed)
-----   Message from Britt Bottom, MD sent at 12/12/2014 10:42 AM EDT ----- Her CT scan does not show any atrophy so Alzheimer's is unlikely..   She does appear to have changes consistent with chronic ischemic changes  Take baby aspirin daily  We will re-evaluate at her next visit    If she feels she is worsening, we can work in sooner and I will consider MRI

## 2014-12-12 NOTE — Telephone Encounter (Signed)
I have spoken with [name] [name] this afternoon and per RAS, advised that CT head did not show changes consistent with AD, but did show chronic ischemic changes.  She is allergic to asa, so per RAS, she should keep f/u visit as scheduled, and call for sooner appt. if she worsens before then/fim

## 2014-12-13 ENCOUNTER — Ambulatory Visit: Payer: Managed Care, Other (non HMO) | Admitting: Internal Medicine

## 2014-12-17 ENCOUNTER — Ambulatory Visit (INDEPENDENT_AMBULATORY_CARE_PROVIDER_SITE_OTHER): Payer: Managed Care, Other (non HMO) | Admitting: Internal Medicine

## 2014-12-17 ENCOUNTER — Encounter: Payer: Self-pay | Admitting: Internal Medicine

## 2014-12-17 VITALS — BP 98/58 | HR 75 | Ht 69.0 in | Wt 170.8 lb

## 2014-12-17 DIAGNOSIS — Z886 Allergy status to analgesic agent status: Secondary | ICD-10-CM

## 2014-12-17 DIAGNOSIS — J339 Nasal polyp, unspecified: Secondary | ICD-10-CM | POA: Diagnosis not present

## 2014-12-17 DIAGNOSIS — J309 Allergic rhinitis, unspecified: Secondary | ICD-10-CM

## 2014-12-17 DIAGNOSIS — J45998 Other asthma: Secondary | ICD-10-CM

## 2014-12-17 DIAGNOSIS — J45909 Unspecified asthma, uncomplicated: Secondary | ICD-10-CM

## 2014-12-17 DIAGNOSIS — J301 Allergic rhinitis due to pollen: Secondary | ICD-10-CM | POA: Diagnosis not present

## 2014-12-17 MED ORDER — METHYLPREDNISOLONE ACETATE 80 MG/ML IJ SUSP
120.0000 mg | Freq: Once | INTRAMUSCULAR | Status: AC
  Administered 2014-12-17: 120 mg via INTRAMUSCULAR

## 2014-12-17 NOTE — Patient Instructions (Addendum)
Depo 120      Dx allergic rhinitis  Go ahead and finish the cephalexin antibiotic from Dr Erik Obey ENT  Please call if we can help

## 2014-12-17 NOTE — Progress Notes (Signed)
Subjective:    Patient ID: [name] [name], female    DOB: 01/15/1951, 64 y.o.   MRN: [id number]  HPI 10/14/10- 12 yoF with asthma, allergic rhinitis.  Last here August 31, 2010- Because of problems with GERD and yeast we were hoping to reduce prednisone which she has been on for much of the time since the 1970's. She has maintained mostly on 5 mg daily. We gave 1 mg tabs, but she didn't feel that 4 mg held her.  Heart burn is better and congestion a bit worse. Pollen season usually gets her some congested. She had been on Xolair x 3 years, then wheezed worse and it was stopped.  Today she feels ok, but changes day-to-day. Main trigger for her she thinks is temperature change/ weather. [place] trip - wheezed in [place].   11/13/10- Asthma, allergic rhinitis/ recurrent sinusitis Called last week and we had her take prednisone 10 mg daily x 7 days then go back to 5 mg daily. She says 5 mg isn't enough. Post nasal drip is getting in her lungs and requiring use of rescue inhaler several times daily. Denies dyspnea or headache. Occasional throat clearing of yellow mucus. Little sustained wheeze, just feels tight.  01/11/11- 59 yoF former smoker, followed for asthma, allergic rhinitis/ hx nasal polyps/ aspirin allergy, recurrent sinusitis complicated by GERD Maintenance prednisone was on 5 mg daily, then went up to 40 and now back to 20/day.  Feels fine now, but last week throat tickle progressed into wheeze and she called [place]. [place], basement, carpet. Says there was mold , but she can't smell so she can't tell. Carpet was replaced and visible leak fixed. Also aware of reflux- diet controlled and continues Zegerid.   04/13/11- 59 yoF former smoker, followed for asthma, allergic rhinitis/ hx nasal polyps/ aspirin allergy, recurrent sinusitis complicated by GERD And she declines flu vaccine. She tried Xolair injections for 3 years with no benefit. At last visit we tried tapering  prednisone to maintenance 5 mg daily. She developed increasing nasal congestion and postnasal drainage so she went back up to 10 mg of prednisone daily. With that she feels better but she says that is not enough prednisone to restore her sense of smell. Using rescue inhaler about one time daily for tickle and chest tightness. Daily Water Pik for her nose returns some yellow material. She does not feel that she has a sinus infection. She has not had recent followup with Dr. Erik Obey ENT and was to see him again as needed. He had told her her nasal problems were allergy  .+nasal congestion, post nasal drip,  11/16/11-  36 yoF former smoker, followed for asthma, allergic rhinitis/ hx nasal polyps/ aspirin allergy, recurrent sinusitis complicated by GERD She is past the spring seasonal rhinitis time for her. Had a skin rash diagnosed as "allergic" at an urgent care they increase prednisone and gave cortisone cream but she is now back to her maintenance prednisone 10 mg daily. The rash is better and she feels fairly stable. Denies wheeze or cough. Still no sense of smell even on a prednisone 60 mg taper.  03/16/2012 Acute OV  Presents for an acute office visit.  Called in 10/1 with 3 weeks of cough /wheezing, prednisone taper sent  She returns today feeling some better but still has some drainage w/ tickle in throat and throat clearing often.  Went to UC initially  w/ depo x2, pred pak and zpak.  Denies cough, SOB, tightness in chest. Or edema.  Currently healing from Rigth thumb fx in cast  Nasal sprays not helping.   06/01/12- 63 yoF former smoker, followed for asthma, allergic rhinitis/ hx nasal polyps/ aspirin allergy, recurrent sinusitis complicated by GERD folllows for : pt states still having gerd like sxs since having gallbladder surg 05-05-12.  states has been having some sob,wheezing,.throat clearing.chest tightness. denies any cough ,fever, When she returned to her GERD diet after gallbladder  surgery, she began experiencing tightness in throat and upper chest. This feeling comes and goes and has noticed also on waking. She is continued chronic maintenance prednisone 10 mg daily after repeatedly trying to reduce the dose and feeling that her breathing deteriorated. Rescue inhaler has little effect. Minor throat tickle. Denies coughing. She continues omeprazole and Carafate and does not feel heartburn. She does not distinguish well between reflux and asthma symptoms CXR 05/01/12-reviewed IMPRESSION:  No evidence for acute cardiopulmonary abnormality.  Original Report Authenticated By: Nolon Nations, M.D.  Office Spirometry 06/01/12- moderate obstructive airways disease. FVC 3.15/85%, FEV1 1.87/65%, FEV1/FVC 0.59% FEF 25-75% 0.92/36% PFT 02/01/08- we compared her old PFT-mild obstructive airways disease with minimal response to bronchodilator. FVC 4.14/107%, FEV1 2.83/98%, FEV1/FVC 0.68, FEF 25-75% 1.72/56%. Scores have declined.  07/20/12- 61 yoF former smoker, followed for asthma, allergic rhinitis/ hx nasal polyps/ aspirin allergy, recurrent sinusitis complicated by GERD FOLLOWS FOR: review 6Mw and PFT results with patient. She says she is "frustrated" by her shortness of breath, but not coughing. Still on chronic prednisone 10 mg daily. She continues to notice GERD. Albuterol helps chest tightness. She asks primary care referral. PFT 07/20/2012-moderate obstructive airways disease with response to bronchodilator, normal lung volumes, diffusion mildly reduced. FVC 3.87/107%, FEV1 2.37/88%, FEV1/FVC 0.61, FEF 25-75% 1.12/39%. TLC 104%, DLCO 71%. 6MWT 07/20/2012-98%, 97%, 98%, 444 m. Noted hip pain. No oxygen limitation.  08/10/12- 61 yoF former smoker, followed for asthma, allergic rhinitis/ hx nasal polyps/ aspirin allergy, recurrent sinusitis complicated by GERD FOLLOWS FOR: states Dulera is not working now; was not feeling as much chest tightness with use but now is having increased and  worsening tightness in chest; denies any cough or wheezing. She declined home nebulizer saying she had tried it before with no benefit. Echocardiogram 08/03/2012-EF 32-20%, gr 1 diastolic dysfunction. No pulmonary hypertension or wall motion abnormality.  09/12/12- 61 yoF former smoker, followed for asthma, allergic rhinitis/ hx nasal polyps/ aspirin allergy, recurrent sinusitis complicated by GERD FOLLOWS FOR: still having chest tightness. Denies any wheezing or cough. Has recently noticed slight tickly/clearing throat since being around sick co worker. Admits she has been worried about her respiratory prognosis since diagnosed with COPD. Which she was exposed to a friend with a cold, she began using an antibiotic she had a home and Mucinex. She still feels somewhat tight without pain. She failed Spiriva but continues Dulera and remains on prednisone 10 mg daily maintenance which we discussed again. GI give her Levsin for possible esophageal spasm with minimal benefit. Dr. Ronnald Ramp treated with Zoloft but it just made her nervous.  12/21/12-61 yoF former smoker, followed for asthma, allergic rhinitis/ hx nasal polyps/ aspirin allergy, recurrent sinusitis complicated by GERD FOLLOW URK:[facility] tightness better with Librax still on 3/day,denies sob,wheezing, and cough Continues prednisone 10 mg daily and has not felt she could wean off. We discussed steroid side effects again. Had bone density.  Denies shortness of breath, cough or wheeze.  07/27/13- 61 yoF former smoker, followed for asthma, allergic rhinitis/  hx nasal polyps/ aspirin allergy, recurrent sinusitis complicated by GERD FOLLOWS FOR:  Breathing doing well -- Discuss exercising Feeling very much better with less cough and chest tightness. Uses Librax 3 times daily as needed for chest tightness, and occasional Levsin with Protonix twice daily.  11/26/13- 62 yoF former smoker, followed for asthma/ COPD, allergic rhinitis/ hx nasal polyps/ aspirin  allergy, recurrent sinusitis complicated by GERD FOLLOWS FOR: green drainage from sinus' for about 1.5 months. Last night she felt tight in chest (states she had BBQ over the weekend and wonders if GERD flared up her COPD). No headache. Says she feels well now. Stays on prednisone 10 mg daily for lungs. Bone density was checked. Continues Dulera but persistent thrush.  04/25/2014 Acute OV  Former smoker, followed for asthma/ COPD, allergic rhinitis/ hx nasal polyps/ aspirin allergy, recurrent sinusitis complicated by GERD Complains of increased SOB, HA, sneezing, dry cough, tightness x 5 days , Worse this am . Denies f/c/s, chest pain n/v/d, purulent sputum, PND, leg swelling. On prednisone 10mg  daily and Dulera  No recent travel or antibiotic use  12/17/14- 62 yoF former smoker, followed for asthma/ COPD, allergic rhinitis/ hx nasal polyps/ aspirin allergy, recurrent sinusitis complicated by GERD FOLLOW FOR: sneezing, blowing out yellow mucus; some cough.  Blames sleeping under a fan for 1 month of nasal congestion with light yellow discharge. She started cephalexin 500 mg 4 times daily/10 days from her ENT doctor. Yesterday took prednisone 20 mg because her throat was sore. Her maintenance doses prednisone 10 mg daily. Images from recent CT of her head were reviewed, noting sinuses. CT head 12/11/14 The orbits and their contents, paranasal sinuses and calvarium are unremarkable.  IMPRESSION:  Abnormal CT head (without) demonstrating: 1. Moderate-severe periventricular and subcortical hypodensities. These findings are non-specific and considerations include autoimmune, inflammatory, post-infectious or microvascular ischemic etiologies.  2. No significant atrophy.  3. No acute findings.  ROS-see HPI   Negative unless "+" Constitutional:    weight loss, night sweats, fevers, chills, fatigue, lassitude. HEENT:    headaches, difficulty swallowing, tooth/dental problems, sore throat,        sneezing, itching, ear ache, +nasal congestion, post nasal drip, snoring CV:    chest pain, orthopnea, PND, swelling in lower extremities, anasarca,                                  dizziness, palpitations Resp:   shortness of breath with exertion or at rest.                productive cough,   non-productive cough, coughing up of blood.              change in color of mucus.  wheezing.   Skin:    rash or lesions. GI:  No-   heartburn, indigestion, abdominal pain, nausea, vomiting,  GU:  MS:   joint pain, stiffness,  Neuro-     nothing unusual Psych:  change in mood or affect.  depression or anxiety.   memory loss.  OBJ- Physical Exam General- Alert, Oriented, Affect-appropriate, Distress- none acute Skin- rash-none, lesions- none, excoriation- none Lymphadenopathy- none Head- atraumatic            Eyes- Gross vision intact, PERRLA, conjunctivae and secretions clear            Ears- Hearing, canals-normal            Nose- +  stuffy, no-Septal dev, mucus, polyps, erosion, perforation             Throat- Mallampati III , mucosa clear , drainage- none, tonsils- atrophic                         , + dentures Neck- flexible , trachea midline, no stridor , thyroid nl, carotid no bruit Chest - symmetrical excursion , unlabored           Heart/CV- RRR , no murmur , no gallop  , no rub, nl s1 s2                           - JVD- none , edema- none, stasis changes- none, varices- none           Lung- clear to P&A, wheeze- none, cough- none , dullness-none, rub- none           Chest wall-  Abd-  Br/ Gen/ Rectal- Not done, not indicated Extrem- cyanosis- none, clubbing, none, atrophy- none, strength- nl Neuro- grossly intact to observation

## 2014-12-23 NOTE — Assessment & Plan Note (Signed)
Continues maintenance prednisone 10 mg daily which has stabilized her asthma and suppressed her polyps.

## 2014-12-23 NOTE — Assessment & Plan Note (Signed)
Acute rhinitis syndrome nonspecific for viral versus allergic. Her main concern is to avoid a flare of her asthma she requests Depo-Medrol which we discussed again. Plan-Depo-Medrol

## 2015-01-14 ENCOUNTER — Other Ambulatory Visit: Payer: Self-pay | Admitting: Internal Medicine

## 2015-01-16 ENCOUNTER — Telehealth: Payer: Self-pay | Admitting: Internal Medicine

## 2015-01-16 MED ORDER — PREDNISONE 20 MG PO TABS: 20.0000 mg | ORAL_TABLET | Freq: Every day | ORAL | Status: DC

## 2015-01-16 NOTE — Telephone Encounter (Signed)
Called spoke with spouse as pt NA. He is aware of recs. He will inform pt.

## 2015-01-16 NOTE — Telephone Encounter (Signed)
Spoke with the pt  She is c/o throat clearing and non prod cough x 2 days  She states " I think it is PND going to my chest" Denies other co's and requests appt  I advised no openings  Please advise, thanks!  Allergies  Allergen Reactions  . Aspirin Shortness Of Breath   Current Outpatient Prescriptions on File Prior to Visit  Medication Sig Dispense Refill  . clidinium-chlordiazePOXIDE (LIBRAX) 5-2.5 MG per capsule TAKE ONE CAPSULE BY MOUTH 3 TIMES A DAY AS NEEDED 90 capsule 2  . COMBIVENT RESPIMAT 20-100 MCG/ACT AERS respimat INHALE 1 PUFF INTO THE LUNGS 2 (TWO) TIMES DAILY. 1 Inhaler 5  . escitalopram (LEXAPRO) 10 MG tablet Take 1 tablet (10 mg total) by mouth daily. 30 tablet 11  . hyoscyamine (LEVSIN, ANASPAZ) 0.125 MG tablet Take by mouth.    . mometasone-formoterol (DULERA) 100-5 MCG/ACT AERO Inhale 2 puffs into the lungs 2 (two) times daily. 3 Inhaler prn  . pantoprazole (PROTONIX) 40 MG tablet TAKE 1 TABLET (40 MG TOTAL) BY MOUTH 2 (TWO) TIMES DAILY. 60 tablet 10  . predniSONE (DELTASONE) 10 MG tablet TAKE 1 TABLET (10 MG TOTAL) BY MOUTH DAILY. 30 tablet 5  . Spacer/Aero-Holding Chambers (AEROCHAMBER MV) inhaler Use as instructed 1 each 0   No current facility-administered medications on file prior to visit.

## 2015-01-16 NOTE — Telephone Encounter (Signed)
Suggest she increase prednisone to 20 mg daily x only 3 days And try allegra 180 daily as needed

## 2015-01-31 ENCOUNTER — Telehealth: Payer: Self-pay | Admitting: Neurology

## 2015-01-31 NOTE — Telephone Encounter (Signed)
Patient called wanting to know what the time frame is for her to start seeing results of escitalopram (LEXAPRO) 10 MG tablet. I advised Dr. Felecia Shelling is out of the office today and it may be Monday before this could be addressed.

## 2015-01-31 NOTE — Telephone Encounter (Signed)
I have spoken with [name] [name] this afternoon and advised that as CT head did not show changes consistent with AD, RAS wanted her to continue Lexapro until her 4 mo. follow up appt.  She verbalized understanding of same.  She sts. she doesn't see much improvement in memory so far, but also doesn't feel it is getting worse.  I have given f/u for October, and she will continue Lexapro until then./fim

## 2015-02-11 ENCOUNTER — Telehealth: Payer: Self-pay | Admitting: Internal Medicine

## 2015-02-11 NOTE — Telephone Encounter (Signed)
Patient needed forms to complete to continue getting Patient assistance for [facility] Forms mailed to patient Address confirmed with patient Nothing further needed.

## 2015-02-16 ENCOUNTER — Other Ambulatory Visit: Payer: Self-pay | Admitting: Internal Medicine

## 2015-02-27 ENCOUNTER — Telehealth: Payer: Self-pay | Admitting: Internal Medicine

## 2015-02-27 NOTE — Telephone Encounter (Signed)
Spoke with pt's husband, pt is taking Dulera 100, not symbicort. One Dulera 100 sample given to pt's husband. Nothing further needed at this time.

## 2015-03-02 ENCOUNTER — Other Ambulatory Visit: Payer: Self-pay | Admitting: Internal Medicine

## 2015-03-03 ENCOUNTER — Telehealth: Payer: Self-pay | Admitting: Internal Medicine

## 2015-03-03 MED ORDER — PREDNISONE 10 MG PO TABS
ORAL_TABLET | ORAL | Status: DC
Start: 2015-03-03 — End: 2015-06-19

## 2015-03-03 NOTE — Telephone Encounter (Signed)
Pt returning call.[name] [name] ° °

## 2015-03-03 NOTE — Telephone Encounter (Signed)
Pt returned call [phone]

## 2015-03-03 NOTE — Telephone Encounter (Signed)
[email or web address]

## 2015-03-03 NOTE — Telephone Encounter (Signed)
LMTC x 1  

## 2015-03-03 NOTE — Telephone Encounter (Signed)
Spoke with pt. She needs refills on Prednisone. Rx has been sent in. Nothing further was needed.

## 2015-03-11 ENCOUNTER — Telehealth: Payer: Self-pay | Admitting: Internal Medicine

## 2015-03-11 ENCOUNTER — Other Ambulatory Visit: Payer: Self-pay | Admitting: Internal Medicine

## 2015-03-11 MED ORDER — CILIDINIUM-CHLORDIAZEPOXIDE 2.5-5 MG PO CAPS: ORAL_CAPSULE | ORAL | Status: DC

## 2015-03-11 NOTE — Telephone Encounter (Signed)
Spoke with pt and advised of Dr Janee Morn recommendations.  Pt verbalized understanding.  Pt also requests refill of Librax.  Last filled 01/21/15 for # 90 tablets.  Please advise if ok to refill.

## 2015-03-11 NOTE — Telephone Encounter (Signed)
Pt c/o ongoing cough and allergy symptoms for past month.  C/o non prod cough, chest congestion, sneezing,  Sinus drainage(clear), some chest tightness.  Denies fever or sob or wheezing.  Pt on prednisone 10mg /day.  Please advise.  Allergies  Allergen Reactions  . Aspirin Shortness Of Breath    Current Outpatient Prescriptions on File Prior to Visit  Medication Sig Dispense Refill  . clidinium-chlordiazePOXIDE (LIBRAX) 5-2.5 MG per capsule TAKE ONE CAPSULE BY MOUTH 3 TIMES A DAY AS NEEDED 90 capsule 2  . COMBIVENT RESPIMAT 20-100 MCG/ACT AERS respimat INHALE 1 PUFF INTO THE LUNGS 2 (TWO) TIMES DAILY. 1 Inhaler 5  . escitalopram (LEXAPRO) 10 MG tablet Take 1 tablet (10 mg total) by mouth daily. 30 tablet 11  . hyoscyamine (LEVSIN, ANASPAZ) 0.125 MG tablet Take by mouth.    . mometasone-formoterol (DULERA) 100-5 MCG/ACT AERO Inhale 2 puffs into the lungs 2 (two) times daily. 3 Inhaler prn  . pantoprazole (PROTONIX) 40 MG tablet TAKE 1 TABLET (40 MG TOTAL) BY MOUTH 2 (TWO) TIMES DAILY. 60 tablet 10  . predniSONE (DELTASONE) 10 MG tablet TAKE 1 TABLET (10 MG TOTAL) BY MOUTH DAILY. 30 tablet 5  . predniSONE (DELTASONE) 20 MG tablet Take 1 tablet (20 mg total) by mouth daily with breakfast. 3 tablet 0  . Spacer/Aero-Holding Chambers (AEROCHAMBER MV) inhaler Use as instructed 1 each 0   No current facility-administered medications on file prior to visit.

## 2015-03-11 NOTE — Telephone Encounter (Signed)
Ok to refill 

## 2015-03-11 NOTE — Telephone Encounter (Signed)
Suggest she increase her prednisone to 20 mg daily x 3 days, then drop back to 10 mg daily Ok to add an antihistamine like claritin or allegra if needed

## 2015-03-11 NOTE — Telephone Encounter (Signed)
Spoke with pt and advised that refill for Librax was sent to pharmacy.

## 2015-03-11 NOTE — Telephone Encounter (Signed)
CY Please advise if okay to refill.  

## 2015-04-01 ENCOUNTER — Ambulatory Visit (INDEPENDENT_AMBULATORY_CARE_PROVIDER_SITE_OTHER): Payer: Managed Care, Other (non HMO) | Admitting: Neurology

## 2015-04-01 ENCOUNTER — Encounter: Payer: Self-pay | Admitting: Neurology

## 2015-04-01 VITALS — BP 136/78 | HR 98 | Resp 16 | Ht 69.0 in | Wt 182.8 lb

## 2015-04-01 DIAGNOSIS — R413 Other amnesia: Secondary | ICD-10-CM | POA: Diagnosis not present

## 2015-04-01 DIAGNOSIS — R41840 Attention and concentration deficit: Secondary | ICD-10-CM

## 2015-04-01 DIAGNOSIS — R7989 Other specified abnormal findings of blood chemistry: Secondary | ICD-10-CM

## 2015-04-01 DIAGNOSIS — F418 Other specified anxiety disorders: Secondary | ICD-10-CM | POA: Diagnosis not present

## 2015-04-01 MED ORDER — CLOPIDOGREL BISULFATE 75 MG PO TABS: 75.0000 mg | ORAL_TABLET | Freq: Every day | ORAL | Status: DC

## 2015-04-01 MED ORDER — METHYLPHENIDATE HCL ER 20 MG PO TBCR: 20.0000 mg | EXTENDED_RELEASE_TABLET | Freq: Every day | ORAL | Status: DC

## 2015-04-01 NOTE — Progress Notes (Signed)
[facility]  PATIENT: [name] [name] DOB: [birth year]  REFERRING DOCTOR OR PCP:  Ricke Hey SOURCE: patients and records form [facility] records  _________________________________   HISTORICAL  CHIEF COMPLAINT:  Chief Complaint  Patient presents with  . Memory Loss    Sts. memory is about the same, despite Lexapro.  Husband agrees with this assessment.  CT showed ischemic changes--she is allergic to aspirin (induces asthma), so she is not currently on any blood thinner./fim    HISTORY OF PRESENT ILLNESS:  [name] [name] is a 64 yo woman with memory decline.   She is still working [employer]olic[employer]).  She is having more difficulty doing her job at work and some of her coworkers have noted that she does not remember when she has been told. However, she denies any difficulty doing math .  She has not lost while driving. She has not had difficulty remembering to turn appliances off.  She feels her attention is poor at times which is affecting focus.     Her mother was in her 29s when she had Alzheimer's disease. Her sister, brother and aunt all had strokes.     CT scan 12/10/2014 showed moderate to severe periventricular subcortical white matter changes consistent with chronic microvascular ischemia.    Mood:   She has had mild depression over the last year.  She has felt a little down and also has had some mood swings. She started citalopram but has not noted any difficulty with mood.    She has not noted any apathy. She has not had crying spells. Many years ago, when her daughter died, she had a severe depression requiring hospitalization and antidepressants for over a year.  Sleep:    She feels that she falls asleep and stays asleep well. Husband has not been noted to snore or gasp for air at night.   REVIEW OF SYSTEMS: Constitutional: No fevers, chills, sweats, or change in appetite Eyes: No visual changes, double vision, eye pain Ear, nose and throat: No hearing  loss, ear pain, nasal congestion, sore throat Cardiovascular: No chest pain, palpitations Respiratory: reports shortness of breath and wheezes GastrointestinaI: No nausea, vomiting, diarrhea, abdominal pain, fecal incontinence Genitourinary: No dysuria, urinary retention or frequency.  No nocturia. Musculoskeletal: No neck pain, back pain but notes pain in the hips and knees. . Integumentary: No rash, pruritus, skin lesions Neurological: as above Psychiatric: No depression at this time.  No anxiety Endocrine: No palpitations, diaphoresis, change in appetite, change in weigh or increased thirst Hematologic/Lymphatic: No anemia, purpura, petechiae. Allergic/Immunologic: No itchy/runny eyes, nasal congestion,   She has some seasonal allergies.  ALLERGIES: Allergies  Allergen Reactions  . Aspirin Shortness Of Breath    HOME MEDICATIONS:  Current outpatient prescriptions:  .  clidinium-chlordiazePOXIDE (LIBRAX) 5-2.5 MG capsule, TAKE ONE CAPSULE BY MOUTH 3 TIMES A DAY AS NEEDED, Disp: 90 capsule, Rfl: 2 .  COMBIVENT RESPIMAT 20-100 MCG/ACT AERS respimat, INHALE 1 PUFF INTO THE LUNGS 2 (TWO) TIMES DAILY., Disp: 1 Inhaler, Rfl: 5 .  escitalopram (LEXAPRO) 10 MG tablet, Take 1 tablet (10 mg total) by mouth daily., Disp: 30 tablet, Rfl: 11 .  hyoscyamine (LEVSIN, ANASPAZ) 0.125 MG tablet, Take by mouth., Disp: , Rfl:  .  mometasone-formoterol (DULERA) 100-5 MCG/ACT AERO, Inhale 2 puffs into the lungs 2 (two) times daily., Disp: 3 Inhaler, Rfl: prn .  pantoprazole (PROTONIX) 40 MG tablet, TAKE 1 TABLET (40 MG TOTAL) BY MOUTH 2 (TWO) TIMES DAILY., Disp: 60 tablet, Rfl:  10 .  predniSONE (DELTASONE) 10 MG tablet, TAKE 1 TABLET (10 MG TOTAL) BY MOUTH DAILY., Disp: 30 tablet, Rfl: 5 .  Spacer/Aero-Holding Chambers (AEROCHAMBER MV) inhaler, Use as instructed, Disp: 1 each, Rfl: 0 .  cephALEXin (KEFLEX) 500 MG capsule, TAKE ONE CAPSULE BY MOUTH 4 TIMES A DAY, Disp: , Rfl: 1 .  predniSONE  (DELTASONE) 20 MG tablet, Take 1 tablet (20 mg total) by mouth daily with breakfast. (Patient not taking: Reported on 04/01/2015), Disp: 3 tablet, Rfl: 0  PAST MEDICAL HISTORY: Past Medical History  Diagnosis Date  . Polyp of nasal cavity   . Extrinsic asthma, unspecified   . Allergic rhinitis, cause unspecified     failed allergy vaccine  . Unspecified sinusitis (chronic)   . 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 S/P dilatation of esophageal stricture 08-2008  . Hiatal hernia   . Diverticulosis   . PONV (postoperative nausea and vomiting)   . Anxiety     panic attacks  . GERD (gastroesophageal reflux disease)   . Arthritis   . Depression   . Vision abnormalities     PAST SURGICAL HISTORY: Past Surgical History  Procedure Laterality Date  . Nasal polyp surgery      x 3   . Tonsillectomy    . Bunionectomy      left foot  . Colonoscopy w/ biopsies and polypectomy    . Thumb fusion  10/13    rt hand  pinned but pin removed  . Cholecystectomy  05/05/2012    Procedure: LAPAROSCOPIC CHOLECYSTECTOMY WITH INTRAOPERATIVE CHOLANGIOGRAM;  Surgeon: Gayland Curry, MD,FACS;  Location: [facility];  Service: General;  Laterality: N/A;  . Polypectomy    . Colonoscopy      FAMILY HISTORY: Family History  Problem Relation Age of Onset  . Colon cancer Maternal Grandfather     unsure age of onset   . Cancer Maternal Grandfather     colon  . Stroke Maternal Aunt   . Heart disease Father     Vague history  . Prostate cancer Brother     SOCIAL HISTORY:  Social History   Social History  . Marital Status: Married    Spouse Name: N/A  . Number of Children: 2  . Years of Education: N/A   Occupational History  . Theatre manager   .     Social History Main Topics  . Smoking status: Former Smoker -- 0.50 packs/day for 0 years    Types: Cigarettes    Quit date: 06/14/1988  . Smokeless tobacco: Never Used  . Alcohol Use: No  . Drug Use: No  . Sexual Activity: Yes     Birth Control/ Protection: Post-menopausal   Other Topics Concern  . Not on file   Social History Narrative   Lives with husband.       PHYSICAL EXAM  Filed Vitals:   04/01/15 1525  BP: 136/78  Pulse: 98  Resp: 16  Height: 5\' 9"  (1.753 m)  Weight: 182 lb 12.8 oz (82.918 kg)    Body mass index is 26.98 kg/(m^2).   General: The patient is well-developed and well-nourished and in no acute distress   Neurologic Exam  Mental status: The patient is alert and oriented x 3 at the time of the examination. The patient has reduced recent (3/3 without prompt) and good remote memory, with a mildly reduced  attention span and concentration ability ([id number]; WORLD-DLROW).  Box is drawn well.  Clock is numbered well .  Speech is normal.    Cranial nerves: Extraocular movements are full.   Facial symmetry is present. There is good facial sensation to soft touch bilaterally.Facial strength is normal.  Trapezius and sternocleidomastoid strength is normal. No dysarthria is noted.  The tongue is midline, and the patient has symmetric elevation of the soft palate.    Motor:  Muscle bulk is normal.   Tone is normal. Strength is  5 / 5 in all 4 extremities.   Sensory: Sensory testing is intact to touch,. Decreased vibration sensation in the toes..  Coordination: Cerebellar testing reveals good finger-nose-finger and heel-to-shin bilaterally.  Gait and station: Station is normal.   Gait is normal. Tandem gait is minimally wide.    Reflexes: Deep tendon reflexes are symmetric and 2+ in the arms and 3+ in the legs bilaterally.        DIAGNOSTIC DATA (LABS, IMAGING, TESTING) - I reviewed patient records, labs, notes, testing and imaging myself where available.  Lab Results  Component Value Date   WBC 7.3 09/01/2012   HGB 12.0 09/01/2012   HCT 36.8 09/01/2012   MCV 81.2 09/01/2012   PLT 293 09/01/2012      Component Value Date/Time   NA 143 10/02/2014 1548   NA 141 09/01/2012  1542   K 4.2 10/02/2014 1548   CL 102 10/02/2014 1548   CO2 25 10/02/2014 1548   GLUCOSE 81 10/02/2014 1548   GLUCOSE 94 09/01/2012 1542   BUN 14 10/02/2014 1548   BUN 14 09/01/2012 1542   CREATININE 1.01* 10/02/2014 1548   CALCIUM 9.3 10/02/2014 1548   PROT 6.6 10/02/2014 1548   PROT 6.7 09/01/2012 1542   ALBUMIN 4.3 10/02/2014 1548   ALBUMIN 3.8 09/01/2012 1542   AST 13 10/02/2014 1548   ALT 9 10/02/2014 1548   ALKPHOS 87 10/02/2014 1548   BILITOT <0.2 10/02/2014 1548   BILITOT 0.5 09/01/2012 1542   GFRNONAA 59* 10/02/2014 1548   GFRAA 68 10/02/2014 1548   Lab Results  Component Value Date   CHOL 228* 09/01/2012   HDL 60.00 09/01/2012   LDLDIRECT 146.4 09/01/2012   TRIG 167.0* 09/01/2012   CHOLHDL 4 09/01/2012   Lab Results  Component Value Date   HGBA1C 5.7 09/01/2012   Lab Results  Component Value Date   VITAMINB12 507 10/02/2014   Lab Results  Component Value Date   TSH 1.210 10/02/2014       ASSESSMENT AND PLAN  Memory difficulties - Plan: Vit D  25 hydroxy (rtn osteoporosis monitoring)  Depression with anxiety - Plan: Vit D  25 hydroxy (rtn osteoporosis monitoring)  Low serum vitamin D - Plan: Vit D  25 hydroxy (rtn osteoporosis monitoring)  Attention deficit   1.   Check Vit D 2.   Ok to stop escitalopram.   Cognitive issues appear to be due more to decreased focus and to a degenerative dementia.  We will do a trial of a stimulant 3. Clopidogrel 75 mg po daily as she is sensitive to aspirin and the CT scan shows severe changes most consistent with small vessel ischemic disease. RTC prn new or worsening neurologic symptoms.   Elick Aguilera A. Felecia Shelling, MD, PhD 86/76/1950, 9:32 PM Certified in Neurology, Clinical Neurophysiology, Sleep Medicine, Pain Medicine and Neuroimaging  [facility] [address], [place], [place] [address] [phone]

## 2015-04-02 ENCOUNTER — Ambulatory Visit: Payer: Managed Care, Other (non HMO) | Admitting: Internal Medicine

## 2015-04-02 ENCOUNTER — Telehealth: Payer: Self-pay | Admitting: *Deleted

## 2015-04-02 ENCOUNTER — Encounter: Payer: Self-pay | Admitting: Internal Medicine

## 2015-04-02 VITALS — BP 112/66 | HR 80 | Ht 69.0 in | Wt 182.8 lb

## 2015-04-02 DIAGNOSIS — J449 Chronic obstructive pulmonary disease, unspecified: Secondary | ICD-10-CM

## 2015-04-02 LAB — VITAMIN D 25 HYDROXY (VIT D DEFICIENCY, FRACTURES): VIT D 25 HYDROXY: 16.5 ng/mL — AB (ref 30.0–100.0)

## 2015-04-02 NOTE — Telephone Encounter (Signed)
LMTC./fim 

## 2015-04-02 NOTE — Patient Instructions (Signed)
For post-nasal drip recommend Nasacort nasal spray   1-2 puffs each nostril once daily at bedtime  For the dry skin itching recommend dry skin care lotions/ moisturizers like Cetaphil, Eucerin, Aveeno

## 2015-04-02 NOTE — Progress Notes (Signed)
Subjective:    Patient ID: [name] [name], female    DOB: 01/15/1951, 64 y.o.   MRN: [id number]  HPI 10/14/10- 12 yoF with asthma, allergic rhinitis.  Last here August 31, 2010- Because of problems with GERD and yeast we were hoping to reduce prednisone which she has been on for much of the time since the 1970's. She has maintained mostly on 5 mg daily. We gave 1 mg tabs, but she didn't feel that 4 mg held her.  Heart burn is better and congestion a bit worse. Pollen season usually gets her some congested. She had been on Xolair x 3 years, then wheezed worse and it was stopped.  Today she feels ok, but changes day-to-day. Main trigger for her she thinks is temperature change/ weather. [place] trip - wheezed in [place].   11/13/10- Asthma, allergic rhinitis/ recurrent sinusitis Called last week and we had her take prednisone 10 mg daily x 7 days then go back to 5 mg daily. She says 5 mg isn't enough. Post nasal drip is getting in her lungs and requiring use of rescue inhaler several times daily. Denies dyspnea or headache. Occasional throat clearing of yellow mucus. Little sustained wheeze, just feels tight.  01/11/11- 59 yoF former smoker, followed for asthma, allergic rhinitis/ hx nasal polyps/ aspirin allergy, recurrent sinusitis complicated by GERD Maintenance prednisone was on 5 mg daily, then went up to 40 and now back to 20/day.  Feels fine now, but last week throat tickle progressed into wheeze and she called [place]. [place], basement, carpet. Says there was mold , but she can't smell so she can't tell. Carpet was replaced and visible leak fixed. Also aware of reflux- diet controlled and continues Zegerid.   04/13/11- 59 yoF former smoker, followed for asthma, allergic rhinitis/ hx nasal polyps/ aspirin allergy, recurrent sinusitis complicated by GERD And she declines flu vaccine. She tried Xolair injections for 3 years with no benefit. At last visit we tried tapering  prednisone to maintenance 5 mg daily. She developed increasing nasal congestion and postnasal drainage so she went back up to 10 mg of prednisone daily. With that she feels better but she says that is not enough prednisone to restore her sense of smell. Using rescue inhaler about one time daily for tickle and chest tightness. Daily Water Pik for her nose returns some yellow material. She does not feel that she has a sinus infection. She has not had recent followup with Dr. Erik Obey ENT and was to see him again as needed. He had told her her nasal problems were allergy  .+nasal congestion, post nasal drip,  11/16/11-  36 yoF former smoker, followed for asthma, allergic rhinitis/ hx nasal polyps/ aspirin allergy, recurrent sinusitis complicated by GERD She is past the spring seasonal rhinitis time for her. Had a skin rash diagnosed as "allergic" at an urgent care they increase prednisone and gave cortisone cream but she is now back to her maintenance prednisone 10 mg daily. The rash is better and she feels fairly stable. Denies wheeze or cough. Still no sense of smell even on a prednisone 60 mg taper.  03/16/2012 Acute OV  Presents for an acute office visit.  Called in 10/1 with 3 weeks of cough /wheezing, prednisone taper sent  She returns today feeling some better but still has some drainage w/ tickle in throat and throat clearing often.  Went to UC initially  w/ depo x2, pred pak and zpak.  Denies cough, SOB, tightness in chest. Or edema.  Currently healing from Rigth thumb fx in cast  Nasal sprays not helping.   06/01/12- 63 yoF former smoker, followed for asthma, allergic rhinitis/ hx nasal polyps/ aspirin allergy, recurrent sinusitis complicated by GERD folllows for : pt states still having gerd like sxs since having gallbladder surg 05-05-12.  states has been having some sob,wheezing,.throat clearing.chest tightness. denies any cough ,fever, When she returned to her GERD diet after gallbladder  surgery, she began experiencing tightness in throat and upper chest. This feeling comes and goes and has noticed also on waking. She is continued chronic maintenance prednisone 10 mg daily after repeatedly trying to reduce the dose and feeling that her breathing deteriorated. Rescue inhaler has little effect. Minor throat tickle. Denies coughing. She continues omeprazole and Carafate and does not feel heartburn. She does not distinguish well between reflux and asthma symptoms CXR 05/01/12-reviewed IMPRESSION:  No evidence for acute cardiopulmonary abnormality.  Original Report Authenticated By: Nolon Nations, M.D.  Office Spirometry 06/01/12- moderate obstructive airways disease. FVC 3.15/85%, FEV1 1.87/65%, FEV1/FVC 0.59% FEF 25-75% 0.92/36% PFT 02/01/08- we compared her old PFT-mild obstructive airways disease with minimal response to bronchodilator. FVC 4.14/107%, FEV1 2.83/98%, FEV1/FVC 0.68, FEF 25-75% 1.72/56%. Scores have declined.  07/20/12- 61 yoF former smoker, followed for asthma, allergic rhinitis/ hx nasal polyps/ aspirin allergy, recurrent sinusitis complicated by GERD FOLLOWS FOR: review 6Mw and PFT results with patient. She says she is "frustrated" by her shortness of breath, but not coughing. Still on chronic prednisone 10 mg daily. She continues to notice GERD. Albuterol helps chest tightness. She asks primary care referral. PFT 07/20/2012-moderate obstructive airways disease with response to bronchodilator, normal lung volumes, diffusion mildly reduced. FVC 3.87/107%, FEV1 2.37/88%, FEV1/FVC 0.61, FEF 25-75% 1.12/39%. TLC 104%, DLCO 71%. 6MWT 07/20/2012-98%, 97%, 98%, 444 m. Noted hip pain. No oxygen limitation.  08/10/12- 61 yoF former smoker, followed for asthma, allergic rhinitis/ hx nasal polyps/ aspirin allergy, recurrent sinusitis complicated by GERD FOLLOWS FOR: states Dulera is not working now; was not feeling as much chest tightness with use but now is having increased and  worsening tightness in chest; denies any cough or wheezing. She declined home nebulizer saying she had tried it before with no benefit. Echocardiogram 08/03/2012-EF 32-20%, gr 1 diastolic dysfunction. No pulmonary hypertension or wall motion abnormality.  09/12/12- 61 yoF former smoker, followed for asthma, allergic rhinitis/ hx nasal polyps/ aspirin allergy, recurrent sinusitis complicated by GERD FOLLOWS FOR: still having chest tightness. Denies any wheezing or cough. Has recently noticed slight tickly/clearing throat since being around sick co worker. Admits she has been worried about her respiratory prognosis since diagnosed with COPD. Which she was exposed to a friend with a cold, she began using an antibiotic she had a home and Mucinex. She still feels somewhat tight without pain. She failed Spiriva but continues Dulera and remains on prednisone 10 mg daily maintenance which we discussed again. GI give her Levsin for possible esophageal spasm with minimal benefit. Dr. Ronnald Ramp treated with Zoloft but it just made her nervous.  12/21/12-61 yoF former smoker, followed for asthma, allergic rhinitis/ hx nasal polyps/ aspirin allergy, recurrent sinusitis complicated by GERD FOLLOW URK:[facility] tightness better with Librax still on 3/day,denies sob,wheezing, and cough Continues prednisone 10 mg daily and has not felt she could wean off. We discussed steroid side effects again. Had bone density.  Denies shortness of breath, cough or wheeze.  07/27/13- 61 yoF former smoker, followed for asthma, allergic rhinitis/  hx nasal polyps/ aspirin allergy, recurrent sinusitis complicated by GERD FOLLOWS FOR:  Breathing doing well -- Discuss exercising Feeling very much better with less cough and chest tightness. Uses Librax 3 times daily as needed for chest tightness, and occasional Levsin with Protonix twice daily.  11/26/13- 62 yoF former smoker, followed for asthma/ COPD, allergic rhinitis/ hx nasal polyps/ aspirin  allergy, recurrent sinusitis complicated by GERD FOLLOWS FOR: green drainage from sinus' for about 1.5 months. Last night she felt tight in chest (states she had BBQ over the weekend and wonders if GERD flared up her COPD). No headache. Says she feels well now. Stays on prednisone 10 mg daily for lungs. Bone density was checked. Continues Dulera but persistent thrush.  04/25/2014 Acute OV  Former smoker, followed for asthma/ COPD, allergic rhinitis/ hx nasal polyps/ aspirin allergy, recurrent sinusitis complicated by GERD Complains of increased SOB, HA, sneezing, dry cough, tightness x 5 days , Worse this am . Denies f/c/s, chest pain n/v/d, purulent sputum, PND, leg swelling. On prednisone 10mg  daily and Dulera  No recent travel or antibiotic use  12/17/14- 62 yoF former smoker, followed for asthma/ COPD, allergic rhinitis/ hx nasal polyps/ aspirin allergy, recurrent sinusitis complicated by GERD FOLLOW FOR: sneezing, blowing out yellow mucus; some cough.  Blames sleeping under a fan for 1 month of nasal congestion with light yellow discharge. She started cephalexin 500 mg 4 times daily/10 days from her ENT doctor. Yesterday took prednisone 20 mg because her throat was sore. Her maintenance doses prednisone 10 mg daily. Images from recent CT of her head were reviewed, noting sinuses. CT head 12/11/14 The orbits and their contents, paranasal sinuses and calvarium are unremarkable.  IMPRESSION:  Abnormal CT head (without) demonstrating: 1. Moderate-severe periventricular and subcortical hypodensities. These findings are non-specific and considerations include autoimmune, inflammatory, post-infectious or microvascular ischemic etiologies.  2. No significant atrophy.  3. No acute findings.  04/02/15-  62 yoF former smoker, followed for asthma/ COPD, allergic rhinitis/ hx nasal polyps/ aspirin allergy, recurrent sinusitis complicated by GERD ACUTE: Follows For: pt states she has some congestion  and stuffy nose. pt c/o dry cough, and at times feels mucus coming down her throat she spits out and is yellow.. no c/o chest tightness, SOB or wheezing.   pt states shes been haviing some itching all over he body unsure if its allergies.     ROS-see HPI   Negative unless "+" Constitutional:    weight loss, night sweats, fevers, chills, fatigue, lassitude. HEENT:    headaches, difficulty swallowing, tooth/dental problems, sore throat,       sneezing, itching, ear ache, +nasal congestion, post nasal drip, snoring CV:    chest pain, orthopnea, PND, swelling in lower extremities, anasarca,                                                     dizziness, palpitations Resp:   shortness of breath with exertion or at rest.                productive cough,   non-productive cough, coughing up of blood.              change in color of mucus.  wheezing.   Skin:    rash or lesions. GI:  No-   heartburn, indigestion, abdominal pain, nausea, vomiting,  GU:  MS:   joint pain, stiffness,  Neuro-     nothing unusual Psych:  change in mood or affect.  depression or anxiety.   memory loss.  OBJ- Physical Exam General- Alert, Oriented, Affect-appropriate, Distress- none acute Skin- rash-none, lesions- none, excoriation- none Lymphadenopathy- none Head- atraumatic            Eyes- Gross vision intact, PERRLA, conjunctivae and secretions clear            Ears- Hearing, canals-normal            Nose- + stuffy, no-Septal dev, mucus, polyps, erosion, perforation             Throat- Mallampati III , mucosa clear , drainage- none, tonsils- atrophic                         , + dentures Neck- flexible , trachea midline, no stridor , thyroid nl, carotid no bruit Chest - symmetrical excursion , unlabored           Heart/CV- RRR , no murmur , no gallop  , no rub, nl s1 s2                           - JVD- none , edema- none, stasis changes- none, varices- none           Lung- clear to P&A, wheeze- none, cough- none  , dullness-none, rub- none           Chest wall-  Abd-  Br/ Gen/ Rectal- Not done, not indicated Extrem- cyanosis- none, clubbing, none, atrophy- none, strength- nl Neuro- grossly intact to observation

## 2015-04-02 NOTE — Telephone Encounter (Signed)
-----   Message from Britt Bottom, MD sent at 04/02/2015 10:10 AM EDT ----- Please note that the vitamin D is low. Please send in vitamin D script 50,000 U weekly 12 weeks. Then 4000-5000 units daily OTC.

## 2015-04-04 ENCOUNTER — Telehealth: Payer: Self-pay | Admitting: Internal Medicine

## 2015-04-04 NOTE — Telephone Encounter (Signed)
Pt is on pt asst program for [facility].  They normally send her 3 Dulera at a time, but the prescription that was sent with her paperwork this time says only 1 at a time.  Pt states she spoke with Joellen Jersey and Joellen Jersey told her she needed to complete the paperwork again.  Pt calling to request that paperwork be mailed to her so she can get 3 Dulera at a time.  Paperwork mailed to pt.  Address confirmed. Nothing further needed.

## 2015-04-07 MED ORDER — VITAMIN D (ERGOCALCIFEROL) 1.25 MG (50000 UNIT) PO CAPS: 50000.0000 [IU] | ORAL_CAPSULE | ORAL | Status: DC

## 2015-04-07 NOTE — Telephone Encounter (Signed)
Patient returned your call.

## 2015-04-07 NOTE — Telephone Encounter (Signed)
I have spoken with [name] [name] this morning, and per RAS, advised that vit. d level is low--advised of the need for rx. vit. d 50,000iu weekly for 12 weeks, then once done with rx., begin otc vit. d 4-5000iu daily.  She verbalized understanding of same.  [facility] per  her request/fim

## 2015-04-07 NOTE — Telephone Encounter (Signed)
-----   Message from Britt Bottom, MD sent at 04/02/2015 10:10 AM EDT ----- Please note that the vitamin D is low. Please send in vitamin D script 50,000 U weekly 12 weeks. Then 4000-5000 units daily OTC.

## 2015-04-23 ENCOUNTER — Telehealth: Payer: Self-pay | Admitting: Internal Medicine

## 2015-04-23 MED ORDER — MOMETASONE FURO-FORMOTEROL FUM 100-5 MCG/ACT IN AERO: 2.0000 | INHALATION_SPRAY | Freq: Every day | RESPIRATORY_TRACT | Status: DC

## 2015-04-23 NOTE — Telephone Encounter (Signed)
Left message to call back  

## 2015-04-23 NOTE — Telephone Encounter (Signed)
Called and spoke with pt Pt stated that she was waiting for refill of Dulera 100 through her pt assistance program Pt stated that she was completely out of dulera and wanted to know if she could come by the office for samples Informed pt that samples would be placed up front with her name on it  Nothing further is needed at this time.

## 2015-04-23 NOTE — Telephone Encounter (Signed)
Patient returned call and can be reached at [place].

## 2015-04-30 ENCOUNTER — Other Ambulatory Visit: Payer: Self-pay | Admitting: Neurology

## 2015-04-30 ENCOUNTER — Encounter: Payer: Self-pay | Admitting: *Deleted

## 2015-04-30 MED ORDER — METHYLPHENIDATE HCL ER 20 MG PO TBCR: 20.0000 mg | EXTENDED_RELEASE_TABLET | Freq: Every day | ORAL | Status: DC

## 2015-04-30 NOTE — Telephone Encounter (Signed)
Pt called and needs refill on methylphenidate (METADATE ER) 20 MG ER tablet. Thank you

## 2015-04-30 NOTE — Progress Notes (Signed)
Ritalin rx. up front [facility]/fim 

## 2015-05-02 ENCOUNTER — Telehealth: Payer: Self-pay | Admitting: Neurology

## 2015-05-02 NOTE — Telephone Encounter (Signed)
LMOM that only 30 day rx. of Ritalin can be given at a time; she should call if she has other questions/fim

## 2015-05-02 NOTE — Telephone Encounter (Signed)
Patient wants to know if she get her refills or more tablets at a time on Methylphenidate so she doesn't have to come back every month. The best number to contact is [phone]

## 2015-05-09 ENCOUNTER — Telehealth: Payer: Self-pay | Admitting: Pulmonary Disease

## 2015-05-09 MED ORDER — PREDNISONE 10 MG PO TABS: ORAL_TABLET | ORAL | Status: DC

## 2015-05-09 NOTE — Telephone Encounter (Signed)
Called by patient for increasing wheezing and dyspnea after several weeks of sore throat.  She notes a cough productive of clear mucus.  No fevers or chills. Will call in prednisone taper. Roselie Awkward, MD [facility] Pager: [phone] Cell: [phone] After 3pm or if no response, call [phone]

## 2015-06-02 ENCOUNTER — Other Ambulatory Visit: Payer: Self-pay | Admitting: Neurology

## 2015-06-02 MED ORDER — METHYLPHENIDATE HCL ER 20 MG PO TBCR: 20.0000 mg | EXTENDED_RELEASE_TABLET | Freq: Every day | ORAL | Status: DC

## 2015-06-02 NOTE — Telephone Encounter (Signed)
Patient called to request refill of Ritalin

## 2015-06-02 NOTE — Telephone Encounter (Signed)
Request forwarded to provider for review.

## 2015-06-03 ENCOUNTER — Telehealth: Payer: Self-pay | Admitting: *Deleted

## 2015-06-03 NOTE — Telephone Encounter (Signed)
Rx for methylphenidate placed up front for pick up. 

## 2015-06-18 ENCOUNTER — Telehealth: Payer: Self-pay | Admitting: Internal Medicine

## 2015-06-18 MED ORDER — AZITHROMYCIN 250 MG PO TABS: ORAL_TABLET | ORAL | Status: DC

## 2015-06-18 NOTE — Telephone Encounter (Signed)
Spoke with the pt and notified of recs per CDY  Pt verbalized understanding and rx was sent to pharm  

## 2015-06-18 NOTE — Telephone Encounter (Signed)
Ok to start Z pak

## 2015-06-18 NOTE — Telephone Encounter (Signed)
Spoke with pt, states since this morning has had a prod cough with clear-green mucus-notes some sinus congestion/pnd.  Denies fever, chest pain/tightness, nausea, chills.  Pt has appt with CY already scheduled for tomorrow.  [facility] [address] [facility] [place].  CY please advise on recs.  Thanks.   Allergies  Allergen Reactions  . Aspirin Shortness Of Breath   Current Outpatient Prescriptions on File Prior to Visit  Medication Sig Dispense Refill  . clidinium-chlordiazePOXIDE (LIBRAX) 5-2.5 MG capsule TAKE ONE CAPSULE BY MOUTH 3 TIMES A DAY AS NEEDED 90 capsule 2  . clopidogrel (PLAVIX) 75 MG tablet Take 1 tablet (75 mg total) by mouth daily. (Patient not taking: Reported on 04/02/2015) 30 tablet 11  . COMBIVENT RESPIMAT 20-100 MCG/ACT AERS respimat INHALE 1 PUFF INTO THE LUNGS 2 (TWO) TIMES DAILY. 1 Inhaler 5  . escitalopram (LEXAPRO) 10 MG tablet Take 1 tablet (10 mg total) by mouth daily. (Patient not taking: Reported on 04/02/2015) 30 tablet 11  . hyoscyamine (LEVSIN, ANASPAZ) 0.125 MG tablet Take by mouth.    . methylphenidate (METADATE ER) 20 MG ER tablet Take 1 tablet (20 mg total) by mouth daily. 30 tablet 0  . mometasone-formoterol (DULERA) 100-5 MCG/ACT AERO Inhale 2 puffs into the lungs 2 (two) times daily. 3 Inhaler prn  . pantoprazole (PROTONIX) 40 MG tablet TAKE 1 TABLET (40 MG TOTAL) BY MOUTH 2 (TWO) TIMES DAILY. 60 tablet 10  . predniSONE (DELTASONE) 10 MG tablet TAKE 1 TABLET (10 MG TOTAL) BY MOUTH DAILY. 30 tablet 5  . predniSONE (DELTASONE) 10 MG tablet Take 60mg  po on day one, then take 40mg  po daily for 3 days, then take 30mg  po daily for 3 days, then take 20mg  po daily for two days, then take 10mg  po daily for 2 days 33 tablet 0  . predniSONE (DELTASONE) 20 MG tablet Take 1 tablet (20 mg total) by mouth daily with breakfast. (Patient not taking: Reported on 04/01/2015) 3 tablet 0  . Spacer/Aero-Holding Chambers (AEROCHAMBER MV) inhaler Use as instructed 1 each 0  .  Vitamin D, Ergocalciferol, (DRISDOL) 50000 UNITS CAPS capsule Take 1 capsule (50,000 Units total) by mouth every 7 (seven) days. 12 capsule 0   No current facility-administered medications on file prior to visit.

## 2015-06-19 ENCOUNTER — Ambulatory Visit (INDEPENDENT_AMBULATORY_CARE_PROVIDER_SITE_OTHER): Payer: Managed Care, Other (non HMO) | Admitting: Internal Medicine

## 2015-06-19 ENCOUNTER — Encounter: Payer: Self-pay | Admitting: Internal Medicine

## 2015-06-19 VITALS — BP 128/62 | HR 82 | Ht 70.0 in | Wt 179.8 lb

## 2015-06-19 DIAGNOSIS — J441 Chronic obstructive pulmonary disease with (acute) exacerbation: Secondary | ICD-10-CM | POA: Diagnosis not present

## 2015-06-19 DIAGNOSIS — J449 Chronic obstructive pulmonary disease, unspecified: Secondary | ICD-10-CM | POA: Diagnosis not present

## 2015-06-19 MED ORDER — DOXYCYCLINE HYCLATE 100 MG PO TABS: ORAL_TABLET | ORAL | Status: DC

## 2015-06-19 NOTE — Progress Notes (Signed)
Subjective:    Patient ID: [name] [name], female    DOB: 01/15/1951, 65 y.o.   MRN: [id number]  HPI 10/14/10- 12 yoF with asthma, allergic rhinitis.  Last here August 31, 2010- Because of problems with GERD and yeast we were hoping to reduce prednisone which she has been on for much of the time since the 1970's. She has maintained mostly on 5 mg daily. We gave 1 mg tabs, but she didn't feel that 4 mg held her.  Heart burn is better and congestion a bit worse. Pollen season usually gets her some congested. She had been on Xolair x 3 years, then wheezed worse and it was stopped.  Today she feels ok, but changes day-to-day. Main trigger for her she thinks is temperature change/ weather. [place] trip - wheezed in [place].   11/13/10- Asthma, allergic rhinitis/ recurrent sinusitis Called last week and we had her take prednisone 10 mg daily x 7 days then go back to 5 mg daily. She says 5 mg isn't enough. Post nasal drip is getting in her lungs and requiring use of rescue inhaler several times daily. Denies dyspnea or headache. Occasional throat clearing of yellow mucus. Little sustained wheeze, just feels tight.  01/11/11- 59 yoF former smoker, followed for asthma, allergic rhinitis/ hx nasal polyps/ aspirin allergy, recurrent sinusitis complicated by GERD Maintenance prednisone was on 5 mg daily, then went up to 40 and now back to 20/day.  Feels fine now, but last week throat tickle progressed into wheeze and she called [place]. [place] home[place], basement, carpet. Says there was mold , but she can't smell so she can't tell. Carpet was replaced and visible leak fixed. Also aware of reflux- diet controlled and continues Zegerid.   04/13/11- 59 yoF former smoker, followed for asthma, allergic rhinitis/ hx nasal polyps/ aspirin allergy, recurrent sinusitis complicated by GERD And she declines flu vaccine. She tried Xolair injections for 3 years with no benefit. At last visit we tried tapering  prednisone to maintenance 5 mg daily. She developed increasing nasal congestion and postnasal drainage so she went back up to 10 mg of prednisone daily. With that she feels better but she says that is not enough prednisone to restore her sense of smell. Using rescue inhaler about one time daily for tickle and chest tightness. Daily Water Pik for her nose returns some yellow material. She does not feel that she has a sinus infection. She has not had recent followup with Dr. Erik Obey ENT and was to see him again as needed. He had told her her nasal problems were allergy  .+nasal congestion, post nasal drip,  11/16/11-  36 yoF former smoker, followed for asthma, allergic rhinitis/ hx nasal polyps/ aspirin allergy, recurrent sinusitis complicated by GERD She is past the spring seasonal rhinitis time for her. Had a skin rash diagnosed as "allergic" at an urgent care they increase prednisone and gave cortisone cream but she is now back to her maintenance prednisone 10 mg daily. The rash is better and she feels fairly stable. Denies wheeze or cough. Still no sense of smell even on a prednisone 60 mg taper.  03/16/2012 Acute OV  Presents for an acute office visit.  Called in 10/1 with 3 weeks of cough /wheezing, prednisone taper sent  She returns today feeling some better but still has some drainage w/ tickle in throat and throat clearing often.  Went to UC initially  w/ depo x2, pred pak and zpak.  Denies cough, SOB, tightness in chest. Or edema.  Currently healing from Rigth thumb fx in cast  Nasal sprays not helping.   06/01/12- 63 yoF former smoker, followed for asthma, allergic rhinitis/ hx nasal polyps/ aspirin allergy, recurrent sinusitis complicated by GERD folllows for : pt states still having gerd like sxs since having gallbladder surg 05-05-12.  states has been having some sob,wheezing,.throat clearing.chest tightness. denies any cough ,fever, When she returned to her GERD diet after gallbladder  surgery, she began experiencing tightness in throat and upper chest. This feeling comes and goes and has noticed also on waking. She is continued chronic maintenance prednisone 10 mg daily after repeatedly trying to reduce the dose and feeling that her breathing deteriorated. Rescue inhaler has little effect. Minor throat tickle. Denies coughing. She continues omeprazole and Carafate and does not feel heartburn. She does not distinguish well between reflux and asthma symptoms CXR 05/01/12-reviewed IMPRESSION:  No evidence for acute cardiopulmonary abnormality.  Original Report Authenticated By: Nolon Nations, M.D.  Office Spirometry 06/01/12- moderate obstructive airways disease. FVC 3.15/85%, FEV1 1.87/65%, FEV1/FVC 0.59% FEF 25-75% 0.92/36% PFT 02/01/08- we compared her old PFT-mild obstructive airways disease with minimal response to bronchodilator. FVC 4.14/107%, FEV1 2.83/98%, FEV1/FVC 0.68, FEF 25-75% 1.72/56%. Scores have declined.  07/20/12- 61 yoF former smoker, followed for asthma, allergic rhinitis/ hx nasal polyps/ aspirin allergy, recurrent sinusitis complicated by GERD FOLLOWS FOR: review 6Mw and PFT results with patient. She says she is "frustrated" by her shortness of breath, but not coughing. Still on chronic prednisone 10 mg daily. She continues to notice GERD. Albuterol helps chest tightness. She asks primary care referral. PFT 07/20/2012-moderate obstructive airways disease with response to bronchodilator, normal lung volumes, diffusion mildly reduced. FVC 3.87/107%, FEV1 2.37/88%, FEV1/FVC 0.61, FEF 25-75% 1.12/39%. TLC 104%, DLCO 71%. 6MWT 07/20/2012-98%, 97%, 98%, 444 m. Noted hip pain. No oxygen limitation.  08/10/12- 61 yoF former smoker, followed for asthma, allergic rhinitis/ hx nasal polyps/ aspirin allergy, recurrent sinusitis complicated by GERD FOLLOWS FOR: states Dulera is not working now; was not feeling as much chest tightness with use but now is having increased and  worsening tightness in chest; denies any cough or wheezing. She declined home nebulizer saying she had tried it before with no benefit. Echocardiogram 08/03/2012-EF 32-20%, gr 1 diastolic dysfunction. No pulmonary hypertension or wall motion abnormality.  09/12/12- 61 yoF former smoker, followed for asthma, allergic rhinitis/ hx nasal polyps/ aspirin allergy, recurrent sinusitis complicated by GERD FOLLOWS FOR: still having chest tightness. Denies any wheezing or cough. Has recently noticed slight tickly/clearing throat since being around sick co worker. Admits she has been worried about her respiratory prognosis since diagnosed with COPD. Which she was exposed to a friend with a cold, she began using an antibiotic she had a home and Mucinex. She still feels somewhat tight without pain. She failed Spiriva but continues Dulera and remains on prednisone 10 mg daily maintenance which we discussed again. GI give her Levsin for possible esophageal spasm with minimal benefit. Dr. Ronnald Ramp treated with Zoloft but it just made her nervous.  12/21/12-61 yoF former smoker, followed for asthma, allergic rhinitis/ hx nasal polyps/ aspirin allergy, recurrent sinusitis complicated by GERD FOLLOW URK:[facility] tightness better with Librax still on 3/day,denies sob,wheezing, and cough Continues prednisone 10 mg daily and has not felt she could wean off. We discussed steroid side effects again. Had bone density.  Denies shortness of breath, cough or wheeze.  07/27/13- 61 yoF former smoker, followed for asthma, allergic rhinitis/  hx nasal polyps/ aspirin allergy, recurrent sinusitis complicated by GERD FOLLOWS FOR:  Breathing doing well -- Discuss exercising Feeling very much better with less cough and chest tightness. Uses Librax 3 times daily as needed for chest tightness, and occasional Levsin with Protonix twice daily.  11/26/13- 62 yoF former smoker, followed for asthma/ COPD, allergic rhinitis/ hx nasal polyps/ aspirin  allergy, recurrent sinusitis complicated by GERD FOLLOWS FOR: green drainage from sinus' for about 1.5 months. Last night she felt tight in chest (states she had BBQ over the weekend and wonders if GERD flared up her COPD). No headache. Says she feels well now. Stays on prednisone 10 mg daily for lungs. Bone density was checked. Continues Dulera but persistent thrush.  04/25/2014 Acute OV  Former smoker, followed for asthma/ COPD, allergic rhinitis/ hx nasal polyps/ aspirin allergy, recurrent sinusitis complicated by GERD Complains of increased SOB, HA, sneezing, dry cough, tightness x 5 days , Worse this am . Denies f/c/s, chest pain n/v/d, purulent sputum, PND, leg swelling. On prednisone 10mg  daily and Dulera  No recent travel or antibiotic use  12/17/14- 62 yoF former smoker, followed for asthma/ COPD, allergic rhinitis/ hx nasal polyps/ aspirin allergy, recurrent sinusitis complicated by GERD FOLLOW FOR: sneezing, blowing out yellow mucus; some cough.  Blames sleeping under a fan for 1 month of nasal congestion with light yellow discharge. She started cephalexin 500 mg 4 times daily/10 days from her ENT doctor. Yesterday took prednisone 20 mg because her throat was sore. Her maintenance doses prednisone 10 mg daily. Images from recent CT of her head were reviewed, noting sinuses. CT head 12/11/14 The orbits and their contents, paranasal sinuses and calvarium are unremarkable.  IMPRESSION:  Abnormal CT head (without) demonstrating: 1. Moderate-severe periventricular and subcortical hypodensities. These findings are non-specific and considerations include autoimmune, inflammatory, post-infectious or microvascular ischemic etiologies.  2. No significant atrophy.  3. No acute findings.  04/02/15-  62 yoF former smoker, followed for asthma/ COPD, allergic rhinitis/ hx nasal polyps/ aspirin allergy, recurrent sinusitis complicated by GERD ACUTE: Follows For: pt states she has some congestion  and stuffy nose. pt c/o dry cough, and at times feels mucus coming down her throat she spits out and is yellow.. no c/o chest tightness, SOB or wheezing.   pt states shes been haviing some itching all over he body unsure if its allergies.  06/19/2015-65 year old female former smoker followed for asthma/COPD, allergic rhinitis/history nasal polyps/aspirin allergy, recurrent sinusitis complicated by GERD FOLLOWS FOR: pt. states she is coughing up green mucus. hoarseness.  no c/o congestion, chest tightness, SOB or wheezing. 1 week acute cough with green sputum but no fever. Sore throat. Hoarse. Prednisone  taper back to 10 mg maintenance daily. On Z-Pak now  ROS-see HPI   Negative unless "+" Constitutional:    weight loss, night sweats, fevers, chills, fatigue, lassitude. HEENT:    headaches, difficulty swallowing, tooth/dental problems, sore throat,       sneezing, itching, ear ache, +nasal congestion, post nasal drip, snoring CV:    chest pain, orthopnea, PND, swelling in lower extremities, anasarca,                                                     dizziness, palpitations Resp:   shortness of breath with exertion or at rest.               +  productive cough,   non-productive cough, coughing up of blood.              change in color of mucus.  wheezing.   Skin:    rash or lesions. GI:  No-   heartburn, indigestion, abdominal pain, nausea, vomiting,  GU:  MS:   joint pain, stiffness,  Neuro-     nothing unusual Psych:  change in mood or affect.  depression or anxiety.   memory loss.  OBJ- Physical Exam General- Alert, Oriented, Affect-appropriate, Distress- none acute Skin- rash-none, lesions- none, excoriation- none Lymphadenopathy- none Head- atraumatic            Eyes- Gross vision intact, PERRLA, conjunctivae and secretions clear            Ears- Hearing, canals-normal            Nose- + stuffy, no-Septal dev, mucus, polyps, erosion, perforation             Throat- Mallampati III ,  mucosa clear , drainage- none, tonsils- atrophic, + hoarse                         , + dentures Neck- flexible , trachea midline, no stridor , thyroid nl, carotid no bruit Chest - symmetrical excursion , unlabored           Heart/CV- RRR , no murmur , no gallop  , no rub, nl s1 s2                           - JVD- none , edema- none, stasis changes- none, varices- none           Lung- clear to P&A, wheeze- none, cough +, dullness-none, rub- none           Chest wall-  Abd-  Br/ Gen/ Rectal- Not done, not indicated Extrem- cyanosis- none, clubbing, none, atrophy- none, strength- nl Neuro- grossly intact to observation

## 2015-06-19 NOTE — Patient Instructions (Signed)
Finish the Zpak   If you don't feel this has taken care of you, you can take the printed script for doxycycline  Neb xop 0.63  Depo 80  Sips of liquids, throat lozenges, stay warm and well-hydrated

## 2015-06-20 MED ORDER — METHYLPREDNISOLONE ACETATE 80 MG/ML IJ SUSP
80.0000 mg | Freq: Once | INTRAMUSCULAR | Status: AC
  Administered 2015-06-19: 80 mg via INTRAMUSCULAR

## 2015-06-23 DIAGNOSIS — J441 Chronic obstructive pulmonary disease with (acute) exacerbation: Secondary | ICD-10-CM | POA: Insufficient documentation

## 2015-06-23 NOTE — Assessment & Plan Note (Signed)
Acute bronchitis, probably began as a viral Plan-finish Z-Pak, encouraged hydration, Mucinex, nebulizer treatments Xopenex, Depo-Medrol

## 2015-07-01 ENCOUNTER — Telehealth: Payer: Self-pay | Admitting: Internal Medicine

## 2015-07-01 ENCOUNTER — Telehealth: Payer: Self-pay | Admitting: Neurology

## 2015-07-01 NOTE — Telephone Encounter (Signed)
Patient called back to say she can't come for appointment tomorrow that was just scheduled by Faith.

## 2015-07-01 NOTE — Telephone Encounter (Signed)
I don't think we should give another antibiotic now. Suggest gargle with salt water or listerine once or twice daily, stay well hydrated and wait this out.  Metadate is a stimulant and likely to cause stimulation/ nervousness. She will need to manage this with her neurologist, Dr Felecia Shelling. Suggest she only take it after a meal- breakfast or no later than lunch. More likely to cause nervousness on an empty stomach.

## 2015-07-01 NOTE — Telephone Encounter (Signed)
Pt is aware of CY's recommendation. Nothing further was needed. 

## 2015-07-01 NOTE — Telephone Encounter (Signed)
I have spoken with [name] [name] this afternoon and given appt. to discuss meds with RAS tomorrow/fim

## 2015-07-01 NOTE — Telephone Encounter (Signed)
I have spoken with [name] [name] this afternoon and given an appt. tomorrow to discuss meds with RAS/fim

## 2015-07-01 NOTE — Telephone Encounter (Signed)
Pt called and c/a appt for 07/02/15. Said she had a conflict with work and she would call back to r/s.

## 2015-07-01 NOTE — Telephone Encounter (Signed)
Spoke with pt, aware of CY's recs.  Nothing further needed.  

## 2015-07-01 NOTE — Telephone Encounter (Signed)
noted/fim 

## 2015-07-01 NOTE — Telephone Encounter (Signed)
noted.  Pt. should not be charged a cancellation fee--she only made appt. this afternoon/fim

## 2015-07-01 NOTE — Telephone Encounter (Signed)
Pt called said she has become nervous while taking methylphenidate (METADATE ER) 20 MG ER tablet . This has been happening intermittently for about 2 weeks. She said about 1 -2 hrs after taking she gets a nervous feeling, heart beating faster. Please call

## 2015-07-01 NOTE — Telephone Encounter (Signed)
Called, spoke with: 1.  She finished zpak and also filled doxy rx given during 06/19/15 OV.  Pt finished the doxy rx yesterday.  However, approx 2 days ago, a continuous, scratchy, sore throat returned.  She is using warm salt water gargles with no relief.  Pt requesting further recs - no f/c/s.  Please advise.  Thank you! 2.  Pt states she's started to notice an intermittent nervous feeling for 1-2 wks.  When this happens,  She's noticed it being approx 1-2 hours after taking Metadate ER 20 mg.  It doesn't happen with every dose of metadate.  Pt states she does not have a PCP and "usually uses Dr. Annamaria Boots."  She called Dr. Garth Bigness office who rx'd the [facility] and had an appt tomorrow to discuss but is unable to keep appt.  She is requesting CY to be informed as well.    [facility]  Allergies  Allergen Reactions  . Aspirin Shortness Of Breath    Current Outpatient Prescriptions on File Prior to Visit  Medication Sig Dispense Refill  . azithromycin (ZITHROMAX Z-PAK) 250 MG tablet Take as directed 6 each 0  . clidinium-chlordiazePOXIDE (LIBRAX) 5-2.5 MG capsule TAKE ONE CAPSULE BY MOUTH 3 TIMES A DAY AS NEEDED 90 capsule 2  . clopidogrel (PLAVIX) 75 MG tablet Take 1 tablet (75 mg total) by mouth daily. 30 tablet 11  . COMBIVENT RESPIMAT 20-100 MCG/ACT AERS respimat INHALE 1 PUFF INTO THE LUNGS 2 (TWO) TIMES DAILY. 1 Inhaler 5  . doxycycline (VIBRA-TABS) 100 MG tablet 2 today then one daily 8 tablet 0  . escitalopram (LEXAPRO) 10 MG tablet Take 1 tablet (10 mg total) by mouth daily. 30 tablet 11  . hyoscyamine (LEVSIN, ANASPAZ) 0.125 MG tablet Take by mouth.    . methylphenidate (METADATE ER) 20 MG ER tablet Take 1 tablet (20 mg total) by mouth daily. 30 tablet 0  . mometasone-formoterol (DULERA) 100-5 MCG/ACT AERO Inhale 2 puffs into the lungs 2 (two) times daily. 3 Inhaler prn  . pantoprazole (PROTONIX) 40 MG tablet TAKE 1 TABLET (40 MG TOTAL) BY MOUTH 2 (TWO) TIMES DAILY. 60 tablet  10  . predniSONE (DELTASONE) 10 MG tablet Take 60mg  po on day one, then take 40mg  po daily for 3 days, then take 30mg  po daily for 3 days, then take 20mg  po daily for two days, then take 10mg  po daily for 2 days 33 tablet 0  . predniSONE (DELTASONE) 20 MG tablet Take 1 tablet (20 mg total) by mouth daily with breakfast. 3 tablet 0  . Spacer/Aero-Holding Chambers (AEROCHAMBER MV) inhaler Use as instructed 1 each 0  . Vitamin D, Ergocalciferol, (DRISDOL) 50000 UNITS CAPS capsule Take 1 capsule (50,000 Units total) by mouth every 7 (seven) days. 12 capsule 0   No current facility-administered medications on file prior to visit.

## 2015-07-02 ENCOUNTER — Ambulatory Visit: Payer: Self-pay | Admitting: Neurology

## 2015-07-16 ENCOUNTER — Other Ambulatory Visit: Payer: Self-pay | Admitting: Neurology

## 2015-07-16 ENCOUNTER — Encounter: Payer: Self-pay | Admitting: *Deleted

## 2015-07-16 MED ORDER — METHYLPHENIDATE HCL ER 20 MG PO TBCR: 20.0000 mg | EXTENDED_RELEASE_TABLET | Freq: Every day | ORAL | Status: DC

## 2015-07-16 NOTE — Progress Notes (Signed)
Ritalin rx. up front [facility]/fim 

## 2015-07-16 NOTE — Telephone Encounter (Signed)
Pt's husband called and says he was told to call our office when he was in [place] to he could pick medication up for his wife. The request was put in at 10:42 am . Janett Billow sent the request to the physician but it has not been printed out yet. He was very up set with me. I explained that our protocol is 24hr wait time. He told me that is not what he was told and said " Im not stupid." I tried to explain to him that I was not sure what time the Rx would be ready and I would ask the nurse to call him when it was. He hung up on me.

## 2015-07-16 NOTE — Telephone Encounter (Signed)
Pt's husband called said pt forgot to take methylphenidate (METADATE ER) 20 MG ER tablet today and this is her last tablet. They live in [place] and he is going to bring her med to her to [place] works here) and is wanting to pick up RX while he is here. He is aware of 24hr protocol. Wife has problems with memory and forgot to call RX in. Can this RX be rushed?

## 2015-07-16 NOTE — Telephone Encounter (Signed)
Request entered, forwarded to provider for approval/signature.

## 2015-07-18 ENCOUNTER — Other Ambulatory Visit: Payer: Self-pay | Admitting: Gastroenterology

## 2015-07-18 NOTE — Telephone Encounter (Signed)
Patient calling in regarding this. She is hoping to have this filled by the weekend.

## 2015-07-21 ENCOUNTER — Other Ambulatory Visit: Payer: Self-pay | Admitting: Gastroenterology

## 2015-08-11 ENCOUNTER — Telehealth: Payer: Self-pay | Admitting: Neurology

## 2015-08-11 NOTE — Telephone Encounter (Signed)
Pt called sts she thinks she is having side effects to methylphenidate (METADATE ER) 20 MG ER tablet . She is having mood swings that started about 1 mth ago. Sts "someone made her angry and the feeling has not subsided, this is out of character for her. She said under normal situations the anger would subside". Pt said she's had a nervousness while on this medication but if she eats or drinks milk when taking the medication she doesn't feel it as bad. She doesn't want to stop taking the medication as she feels it is helping with concentration.

## 2015-08-11 NOTE — Telephone Encounter (Signed)
Appt. given to discuss mood swings/fim

## 2015-08-19 ENCOUNTER — Ambulatory Visit (INDEPENDENT_AMBULATORY_CARE_PROVIDER_SITE_OTHER): Payer: Managed Care, Other (non HMO) | Admitting: Gastroenterology

## 2015-08-19 ENCOUNTER — Telehealth: Payer: Self-pay | Admitting: Emergency Medicine

## 2015-08-19 ENCOUNTER — Encounter: Payer: Self-pay | Admitting: Gastroenterology

## 2015-08-19 ENCOUNTER — Ambulatory Visit: Payer: Self-pay | Admitting: Neurology

## 2015-08-19 VITALS — BP 120/80 | HR 90 | Ht 69.0 in | Wt 176.0 lb

## 2015-08-19 DIAGNOSIS — K219 Gastro-esophageal reflux disease without esophagitis: Secondary | ICD-10-CM | POA: Diagnosis not present

## 2015-08-19 DIAGNOSIS — Z86010 Personal history of colonic polyps: Secondary | ICD-10-CM

## 2015-08-19 DIAGNOSIS — Z860101 Personal history of adenomatous and serrated colon polyps: Secondary | ICD-10-CM

## 2015-08-19 MED ORDER — HYOSCYAMINE SULFATE 0.125 MG SL SUBL: 0.1250 mg | SUBLINGUAL_TABLET | SUBLINGUAL | Status: DC | PRN

## 2015-08-19 MED ORDER — NA SULFATE-K SULFATE-MG SULF 17.5-3.13-1.6 GM/177ML PO SOLN: 1.0000 | ORAL | Status: AC

## 2015-08-19 MED ORDER — PANTOPRAZOLE SODIUM 40 MG PO TBEC: 40.0000 mg | DELAYED_RELEASE_TABLET | Freq: Two times a day (BID) | ORAL | Status: DC

## 2015-08-19 NOTE — Patient Instructions (Addendum)
You have been scheduled for a colonoscopy. Please follow written instructions given to you at your visit today.  Please pick up your prep supplies at the pharmacy within the next 1-3 days. If you use inhalers (even only as needed), please bring them with you on the day of your procedure. Your physician has requested that you go to www.startemmi.com and enter the access code given to you at your visit today. This web site gives a general overview about your procedure. However, you should still follow specific instructions given to you by our office regarding your preparation for the procedure.  You will be contaced by our office prior to your procedure for directions on holding your Coumadin/Warfarin.  If you do not hear from our office 1 week prior to your scheduled procedure, please call [phone] to discuss.   We have sent the following medications to your pharmacy for you to pick up at your convenience: Levsin  Pantoprazole

## 2015-08-19 NOTE — Progress Notes (Signed)
    History of Present Illness: This is a 65 year old female returning for follow-up of GERD and intermittent abdominal cramping. She has occasional flares of reflux which she feels triggers a COPD exacerbation. These episodes are almost always related to known dietary stressors of reflux. She takes Librax prn and hyoscyamine prn for intermittent abdominal cramping. These symptoms have not changed. She is overdue for her surveillance colonoscopy.  Current Medications, Allergies, Past Medical History, Past Surgical History, Family History and Social History were reviewed in [employer] record.  Physical Exam: General: Well developed, well nourished, no acute distress Head: Normocephalic and atraumatic Eyes:  sclerae anicteric, EOMI Ears: Normal auditory acuity Mouth: No deformity or lesions Lungs: Clear throughout to auscultation Heart: Regular rate and rhythm; no murmurs, rubs or bruits Abdomen: Soft, non tender and non distended. No masses, hepatosplenomegaly or hernias noted. Normal Bowel sounds Musculoskeletal: Symmetrical with no gross deformities  Pulses:  Normal pulses noted Extremities: No clubbing, cyanosis, edema or deformities noted Neurological: Alert oriented x 4, grossly nonfocal Psychological:  Alert and cooperative. Normal mood and affect  Assessment and Recommendations:  1. GERD. Refill pantoprazole 40 mg twice daily and closely follow all antireflux measures. Especially avoid foods that trigger reflux symptoms which might be triggering COPD exacerbations.  2. IBS. Continue hyoscyamine 1-2 every 4 hours as needed for abdominal cramping. Librax 1 capsule 3 times a day only if hyoscyamine is not effective in controlling symptoms.  3. Personal history of TVA overdue for surveillance colonoscopy. The risks (including bleeding, perforation, infection, missed lesions, medication reactions and possible hospitalization or surgery if complications occur),  benefits, and alternatives to colonoscopy with possible biopsy and possible polypectomy were discussed with the patient and they consent to proceed. Hold Plavix 5 days before procedure - will instruct when and how to resume after procedure. Low but real risk of cardiovascular event such as heart attack, stroke, embolism, thrombosis or ischemia/infarct of other organs off Plavix explained and need to seek urgent help if this occurs. The patient consents to proceed. Will communicate by phone or EMR with patient's prescribing provider to confirm that holding Plavix is reasonable in this case.

## 2015-08-19 NOTE — Telephone Encounter (Signed)
[name] [name] may go off of the Plavix 5 days before her procedure.  She may resume after the procedure when medically able to do so from GI perspective.

## 2015-08-19 NOTE — Telephone Encounter (Signed)
08/19/2015   RE: [name] [name] DOB: 03/25/1951 MRN: [id number]   Dear Dr.Sater,    We have scheduled the above patient for an endoscopic procedure. Our records show that she is on anticoagulation therapy.   Please advise as to how long the patient may come off her therapy of Plavix prior to the procedure, which is scheduled for 10/06/15.  Please fax back/ or route the completed form to [employer] or Marlon Pel.   Sincerely,    Tinnie Gens, Gem

## 2015-08-20 ENCOUNTER — Encounter: Payer: Self-pay | Admitting: Neurology

## 2015-08-20 ENCOUNTER — Ambulatory Visit (INDEPENDENT_AMBULATORY_CARE_PROVIDER_SITE_OTHER): Payer: Managed Care, Other (non HMO) | Admitting: Neurology

## 2015-08-20 ENCOUNTER — Ambulatory Visit (INDEPENDENT_AMBULATORY_CARE_PROVIDER_SITE_OTHER): Payer: Managed Care, Other (non HMO) | Admitting: Adult Health

## 2015-08-20 ENCOUNTER — Telehealth: Payer: Self-pay | Admitting: Internal Medicine

## 2015-08-20 VITALS — BP 126/80 | HR 76 | Resp 14 | Ht 70.0 in | Wt 176.0 lb

## 2015-08-20 VITALS — BP 122/78 | HR 98 | Ht 70.0 in | Wt 176.8 lb

## 2015-08-20 DIAGNOSIS — J449 Chronic obstructive pulmonary disease, unspecified: Secondary | ICD-10-CM

## 2015-08-20 DIAGNOSIS — F418 Other specified anxiety disorders: Secondary | ICD-10-CM | POA: Diagnosis not present

## 2015-08-20 DIAGNOSIS — R413 Other amnesia: Secondary | ICD-10-CM | POA: Diagnosis not present

## 2015-08-20 DIAGNOSIS — J309 Allergic rhinitis, unspecified: Secondary | ICD-10-CM

## 2015-08-20 DIAGNOSIS — R41840 Attention and concentration deficit: Secondary | ICD-10-CM | POA: Diagnosis not present

## 2015-08-20 DIAGNOSIS — K219 Gastro-esophageal reflux disease without esophagitis: Secondary | ICD-10-CM | POA: Diagnosis not present

## 2015-08-20 MED ORDER — METHYLPHENIDATE HCL 10 MG PO TABS: ORAL_TABLET | ORAL | Status: DC

## 2015-08-20 MED ORDER — ESCITALOPRAM OXALATE 10 MG PO TABS: 10.0000 mg | ORAL_TABLET | Freq: Every day | ORAL | Status: DC

## 2015-08-20 MED ORDER — PREDNISONE 10 MG PO TABS: ORAL_TABLET | ORAL | Status: DC

## 2015-08-20 NOTE — Telephone Encounter (Signed)
Patient is having problems with her COPD x 1 week.  Lot of SOB, tightness in the chest.  No cough.  Just feels very tight in her chest. Wants to be seen today.  Patient took a vacation day today, so she is off work today.  Offered her an appointment tomorrow, she refused appointment because she has to go back to work.   Dr. Annamaria Boots, can patient be worked in on your schedule or see NP?  Please advise.

## 2015-08-20 NOTE — Progress Notes (Signed)
[facility]  PATIENT: [name] [name] DOB: Sep 10, 1950  REFERRING DOCTOR OR PCP:  Billee Cashing SOURCE: patients and records form [facility] records  _________________________________   HISTORICAL  CHIEF COMPLAINT:  Chief Complaint  Patient presents with  . Memory Loss    Sts. she is having more depression, "aggravation."  Lexapro was stopped at last ov.  Sts. memory is about the same.  Feels concentration is better since starting Ritalin./fim    HISTORY OF PRESENT ILLNESS:  [name] [name] is a 65 yo woman with memory decline.   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     She denies any difficulty doing math .  She has not become lost while driving. She has not had difficulty remembering to turn appliances off.    She feels her attention is poor at times which is affecting focus.     Her mother was in her 64s when she had Alzheimer's disease. Her sister, brother and aunt all had strokes.     Since the last visit, she has tried ritalin and feels it helped her focus but not her memory.     She has noted more mood swings while taking it.    CT scan 12/10/2014 showed moderate to severe periventricular subcortical white matter changes consistent with chronic microvascular ischemia.    Mood:   She has had mild depression over the last year.  She has felt a little down and also has had some mood swings. She stopped escitalopram and had more mood swings.    Sleep:    She feels that she falls asleep and stays asleep well. Husband has not been noted to snore or gasp for air at night.   REVIEW OF SYSTEMS: Constitutional: No fevers, chills, sweats, or change in appetite Eyes: No visual changes, double vision, eye pain Ear, nose and throat: No hearing loss, ear pain, nasal congestion, sore throat Cardiovascular: No chest pain, palpitations Respiratory: reports shortness of breath and wheezes GastrointestinaI: No nausea,  vomiting, diarrhea, abdominal pain, fecal incontinence Genitourinary: No dysuria, urinary retention or frequency.  No nocturia. Musculoskeletal: No neck pain, back pain but notes pain in the hips and knees. . Integumentary: No rash, pruritus, skin lesions Neurological: as above Psychiatric: No depression at this time.  No anxiety Endocrine: No palpitations, diaphoresis, change in appetite, change in weigh or increased thirst Hematologic/Lymphatic: No anemia, purpura, petechiae. Allergic/Immunologic: No itchy/runny eyes, nasal congestion,   She has some seasonal allergies.  ALLERGIES: Allergies  Allergen Reactions  . Aspirin Shortness Of Breath    HOME MEDICATIONS:  Current outpatient prescriptions:  .  clidinium-chlordiazePOXIDE (LIBRAX) 5-2.5 MG capsule, TAKE ONE CAPSULE BY MOUTH 3 TIMES A DAY AS NEEDED, Disp: 90 capsule, Rfl: 2 .  clopidogrel (PLAVIX) 75 MG tablet, Take 1 tablet (75 mg total) by mouth daily., Disp: 30 tablet, Rfl: 11 .  COMBIVENT RESPIMAT 20-100 MCG/ACT AERS respimat, INHALE 1 PUFF INTO THE LUNGS 2 (TWO) TIMES DAILY., Disp: 1 Inhaler, Rfl: 5 .  hyoscyamine (LEVSIN SL) 0.125 MG SL tablet, Take 1 tablet (0.125 mg total) by mouth every 4 (four) hours as needed for cramping., Disp: 120 tablet, Rfl: 11 .  Investigational vitamin D 600 UNITS capsule SWOG S0812, Take 600 Units by mouth 2 (two) times daily. Take with food., Disp: , Rfl:  .  methylphenidate (METADATE ER) 20 MG ER tablet, Take 1 tablet (20 mg total) by mouth daily., Disp: 30 tablet, Rfl: 0 .  mometasone-formoterol (DULERA) 100-5 MCG/ACT AERO, Inhale 2 puffs into the lungs 2 (two) times daily., Disp: 3 Inhaler, Rfl: prn .  Na Sulfate-K Sulfate-Mg Sulf SOLN, Take 1 kit by mouth as directed., Disp: 354 mL, Rfl: 0 .  pantoprazole (PROTONIX) 40 MG tablet, Take 1 tablet (40 mg total) by mouth 2 (two) times daily., Disp: 60 tablet, Rfl: 11 .  predniSONE (DELTASONE) 10 MG tablet, Take '60mg'$  po on day one, then take  '40mg'$  po daily for 3 days, then take '30mg'$  po daily for 3 days, then take '20mg'$  po daily for two days, then take '10mg'$  po daily for 2 days (Patient taking differently: Take 10 mg by mouth daily with breakfast. Take '60mg'$  po on day one, then take '40mg'$  po daily for 3 days, then take '30mg'$  po daily for 3 days, then take '20mg'$  po daily for two days, then take '10mg'$  po daily for 2 days), Disp: 33 tablet, Rfl: 0 .  Spacer/Aero-Holding Chambers (AEROCHAMBER MV) inhaler, Use as instructed, Disp: 1 each, Rfl: 0  PAST MEDICAL HISTORY: Past Medical History  Diagnosis Date  . Polyp of nasal cavity   . Extrinsic asthma, unspecified   . Allergic rhinitis, cause unspecified     failed allergy vaccine  . Unspecified sinusitis (chronic)   . 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 S/P dilatation of esophageal stricture 08-2008  . Hiatal hernia   . Diverticulosis   . PONV (postoperative nausea and vomiting)   . Anxiety     panic attacks  . GERD (gastroesophageal reflux disease)   . Arthritis   . Depression   . Vision abnormalities     PAST SURGICAL HISTORY: Past Surgical History  Procedure Laterality Date  . Nasal polyp surgery      x 3   . Tonsillectomy    . Bunionectomy      left foot  . Colonoscopy w/ biopsies and polypectomy    . Thumb fusion  10/13    rt hand  pinned but pin removed  . Cholecystectomy  05/05/2012    Procedure: LAPAROSCOPIC CHOLECYSTECTOMY WITH INTRAOPERATIVE CHOLANGIOGRAM;  Surgeon: Gayland Curry, MD,FACS;  Location: [facility];  Service: General;  Laterality: N/A;  . Polypectomy    . Colonoscopy      FAMILY HISTORY: Family History  Problem Relation Age of Onset  . Colon cancer Maternal Grandfather     unsure age of onset   . Cancer Maternal Grandfather     colon  . Stroke Maternal Aunt   . Heart disease Father     Vague history  . Prostate cancer Brother   . Ataxia Daughter     died at age 5.5    SOCIAL HISTORY:  Social History   Social History  . Marital  Status: Married    Spouse Name: N/A  . Number of Children: 2  . Years of Education: N/A   Occupational History  . Theatre manager   .     Social History Main Topics  . Smoking status: Former Smoker -- 0.50 packs/day for 0 years    Types: Cigarettes    Quit date: 06/14/1988  . Smokeless tobacco: Never Used  . Alcohol Use: No  . Drug Use: No  . Sexual Activity: Yes    Birth Control/ Protection: Post-menopausal   Other Topics Concern  . Not on file   Social History Narrative   Lives with husband.       PHYSICAL EXAM  Filed Vitals:  08/20/15 0944  BP: 126/80  Pulse: 76  Resp: 14  Height: '5\' 10"'$  (1.778 m)  Weight: 176 lb (79.833 kg)    Body mass index is 25.25 kg/(m^2).   General: The patient is well-developed and well-nourished and in no acute distress   Neurologic Exam  Mental status: The patient is alert and oriented x 3 at the time of the examination. The patient has reduced recent (3/3 without prompt) and good remote memory, with a mildly reduced  attention span and concentration ability ([id number]?;  WORLD-DLROW).     Speech is normal.     Cranial nerves: Extraocular movements are full.   Facial symmetry is present. There is good facial sensation to soft touch bilaterally.Facial strength is normal.  Trapezius and sternocleidomastoid strength is normal. No dysarthria is noted.  The tongue is midline, and the patient has symmetric elevation of the soft palate.    Motor:  Muscle bulk is normal.   Tone is normal. Strength is  5 / 5 in all 4 extremities.   Sensory: Sensory testing is intact to touch,.   Coordination: Cerebellar testing reveals good finger-nose-finger and heel-to-shin bilaterally.  Gait and station: Station is normal.   Gait is normal. Tandem gait is minimally wide.   Marland Kitchen   DTRs are normal and symmetric.   Marland Kitchen        DIAGNOSTIC DATA (LABS, IMAGING, TESTING) - I reviewed patient records, labs, notes, testing and imaging myself where  available.  Lab Results  Component Value Date   WBC 7.3 09/01/2012   HGB 12.0 09/01/2012   HCT 36.8 09/01/2012   MCV 81.2 09/01/2012   PLT 293 09/01/2012      Component Value Date/Time   NA 143 10/02/2014 1548   NA 141 09/01/2012 1542   K 4.2 10/02/2014 1548   CL 102 10/02/2014 1548   CO2 25 10/02/2014 1548   GLUCOSE 81 10/02/2014 1548   GLUCOSE 94 09/01/2012 1542   BUN 14 10/02/2014 1548   BUN 14 09/01/2012 1542   CREATININE 1.01* 10/02/2014 1548   CALCIUM 9.3 10/02/2014 1548   PROT 6.6 10/02/2014 1548   PROT 6.7 09/01/2012 1542   ALBUMIN 4.3 10/02/2014 1548   ALBUMIN 3.8 09/01/2012 1542   AST 13 10/02/2014 1548   ALT 9 10/02/2014 1548   ALKPHOS 87 10/02/2014 1548   BILITOT <0.2 10/02/2014 1548   BILITOT 0.5 09/01/2012 1542   GFRNONAA 59* 10/02/2014 1548   GFRAA 68 10/02/2014 1548   Lab Results  Component Value Date   CHOL 228* 09/01/2012   HDL 60.00 09/01/2012   LDLDIRECT 146.4 09/01/2012   TRIG 167.0* 09/01/2012   CHOLHDL 4 09/01/2012   Lab Results  Component Value Date   HGBA1C 5.7 09/01/2012   Lab Results  Component Value Date   VITAMINB12 507 10/02/2014   Lab Results  Component Value Date   TSH 1.210 10/02/2014       ASSESSMENT AND PLAN  Memory difficulties  Attention deficit  Depression with anxiety   1.   Check Vit D 2.   Restart escitalopram.   Change methylphenidate to a lower dose bid  3.  Clopidogrel 75 mg po daily.  She may go off of this medication 5 days before her GI procedure and restart plan GI feels it is safe to do so. RTC prn new or worsening neurologic symptoms.   Trinaty Bundrick A. Felecia Shelling, MD, PhD 07/17/3005, 6:22 AM Certified in Neurology, Clinical Neurophysiology, Sleep Medicine, Pain Medicine and Neuroimaging  [facility] [address], [address] [place], [place] [address] [phone]   

## 2015-08-20 NOTE — Progress Notes (Signed)
Chief Complaint  Patient presents with  . Acute Visit    CY pt. Pt states that she ate LifeSavers for several days and noticed that they contained citric acid. Her stomach has been upset so she stopped eating them. Pt states that this has also upset her COPD. Pt c/o centralized chest tightness and SOB only when driving in her car, once she is out of the car her symptoms improve. Pt denies cough/wheeze.      Past medical hx Past Medical History  Diagnosis Date  . Polyp of nasal cavity   . Extrinsic asthma, unspecified   . Allergic rhinitis, cause unspecified     failed allergy vaccine  . Unspecified sinusitis (chronic)   . 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 S/P dilatation of esophageal stricture 08-2008  . Hiatal hernia   . Diverticulosis   . PONV (postoperative nausea and vomiting)   . Anxiety     panic attacks  . GERD (gastroesophageal reflux disease)   . Arthritis   . Depression   . Vision abnormalities      Past surgical hx, Allergies, Family hx, Social hx all reviewed.  Current Outpatient Prescriptions on File Prior to Visit  Medication Sig  . clidinium-chlordiazePOXIDE (LIBRAX) 5-2.5 MG capsule TAKE ONE CAPSULE BY MOUTH 3 TIMES A DAY AS NEEDED  . clopidogrel (PLAVIX) 75 MG tablet Take 1 tablet (75 mg total) by mouth daily.  . COMBIVENT RESPIMAT 20-100 MCG/ACT AERS respimat INHALE 1 PUFF INTO THE LUNGS 2 (TWO) TIMES DAILY.  Marland Kitchen escitalopram (LEXAPRO) 10 MG tablet Take 1 tablet (10 mg total) by mouth daily.  . hyoscyamine (LEVSIN SL) 0.125 MG SL tablet Take 1 tablet (0.125 mg total) by mouth every 4 (four) hours as needed for cramping.  . Investigational vitamin D 600 UNITS capsule SWOG K2706 Take 600 Units by mouth 2 (two) times daily. Take with food.  . methylphenidate (RITALIN) 10 MG tablet Take one pill po when you wake up and one pill 4 hours later  . mometasone-formoterol (DULERA) 100-5 MCG/ACT AERO Inhale 2 puffs into the lungs 2 (two) times daily.  . Na  Sulfate-K Sulfate-Mg Sulf SOLN Take 1 kit by mouth as directed.  . pantoprazole (PROTONIX) 40 MG tablet Take 1 tablet (40 mg total) by mouth 2 (two) times daily.  Marland Kitchen Spacer/Aero-Holding Chambers (AEROCHAMBER MV) inhaler Use as instructed   No current facility-administered medications on file prior to visit.     Vital Signs BP 122/78 mmHg  Pulse 98  Ht _0  (1.778 m)  Wt 176 lb 12.8 oz (80.196 kg)  BMI 25.37 kg/m2  SpO2 97%  History of Present Illness [name] [name] is a 65 y.o. female with asthma maintained on dulera and combivent, allergic rhinitis on chronic prednisone, GERD (followed by Dr. Fuller Plan).  She presents today for acute OV with c/o chest tightness.  She has had increased reflux s/s over the last few days which she attributes to eating lifesavers which contain citric acid and ketchup.  Chest feels tight when she takes a deep breath and has noticed some intermittent wheeze. Saw GI yesterday who reiterated her GERD diet restrictions and felt that she should see pulmonary.    Denies overt chest pain.  Does have some DOE but no SOB at rest.  Denies cough, purulent sputum, fever.  Denies arm pain, jaw pain, nausea, diaphoresis, orthopnea, BLE edema.     Physical Exam  General - No distress  ENT - No sinus tenderness, no  oral exudate, no LAN Cardiac - s1s2 regular, no murmur Chest - resps even non labored on RA, rare exp wheeze. Good air movement throughout bilat Back - No focal tenderness Abd - Soft, non-tender Ext - No edema Neuro - Normal strength Skin - No rashes Psych - normal mood, and behavior   Assessment/Plan  Asthma - mild flare likely r/t GERD.  Allergic rhinitis  GERD  Patient Instructions  Prednisone taper -- sent to pharmacy  Continue protonix twice daily - be sure to take at least 30 mins prior to meals  Follow your GERD diet  Continue dulera, combivent   Can use delsym for cough if needed  F/u with Dr. Annamaria Boots as previously scheduled  Call if you  develop fever or cough with phlegm  Please contact office for sooner follow up if symptoms do not improve or worsen or seek emergency care     Nickolas Madrid, NP 08/20/2015  12:38 PM

## 2015-08-20 NOTE — Telephone Encounter (Signed)
Per CY-okay for patient to see NP today-KW has openings. Left message for patient to call back.

## 2015-08-20 NOTE — Telephone Encounter (Signed)
Made an appt with KW for 12 today.  May close msg.

## 2015-08-20 NOTE — Telephone Encounter (Signed)
Left a message for patient to return my call. 

## 2015-08-20 NOTE — Patient Instructions (Addendum)
Prednisone taper -- sent to pharmacy  Continue protonix twice daily - be sure to take at least 30 mins prior to meals  Follow your GERD diet  Continue dulera, combivent   Can use delsym for cough if needed  F/u with Dr. Annamaria Boots as previously scheduled  Call if you develop fever or cough with phlegm  Please contact office for sooner follow up if symptoms do not improve or worsen or seek emergency care

## 2015-08-21 NOTE — Telephone Encounter (Signed)
Informed patient of Dr. Garth Bigness recommendations to hold Plavix 5 days prior to procedure. Pt verbalized understanding.

## 2015-08-21 NOTE — Telephone Encounter (Signed)
Left a message for patient to return my call. 

## 2015-08-26 ENCOUNTER — Telehealth: Payer: Self-pay | Admitting: Neurology

## 2015-08-26 MED ORDER — SERTRALINE HCL 25 MG PO TABS: 25.0000 mg | ORAL_TABLET | Freq: Every day | ORAL | Status: DC

## 2015-08-26 NOTE — Telephone Encounter (Signed)
Pt called saying escitalopram (LEXAPRO) 10 MG tablet is making her extremely sleepy. She can not function while at work. Pt would like another suggestion for different medication. Please call and advise [phone]

## 2015-08-26 NOTE — Telephone Encounter (Signed)
I have spoken with [name] [name], and per RAS, offered Sertraline 25mg  po daily.  She verbalized understanding of same, is agreeable.  She will stop Lexapro, start Sertraline.  Rx. escribed to [facility] per her request/fim

## 2015-09-18 ENCOUNTER — Telehealth: Payer: Self-pay | Admitting: Gastroenterology

## 2015-09-18 ENCOUNTER — Telehealth: Payer: Self-pay | Admitting: Internal Medicine

## 2015-09-18 ENCOUNTER — Other Ambulatory Visit: Payer: Self-pay | Admitting: Adult Health

## 2015-09-18 MED ORDER — PREDNISONE 10 MG PO TABS: ORAL_TABLET | ORAL | Status: DC

## 2015-09-18 NOTE — Telephone Encounter (Signed)
Called and spoke to pt. Pt states she does not have anymore prednisone and needs a new rx sent to preferred pharmacy. Rx sent to preferred pharmacy. Pt verbalized understanding and denied any further questions or concerns at this time.     Deneise Lever, MD at 09/18/2015 1:12 PM     Status: Signed       Expand All Collapse All   Try alternating prednisone 10 mg with 20 mg every other day for a week or 2. See if that helps.,

## 2015-09-18 NOTE — Telephone Encounter (Signed)
LM for pt to returncall

## 2015-09-18 NOTE — Telephone Encounter (Signed)
Try alternating prednisone 10 mg with 20 mg every other day for a week or 2. See if that helps.,

## 2015-09-18 NOTE — Telephone Encounter (Signed)
Pt aware of rec's per CY Nothing further needed. 

## 2015-09-18 NOTE — Telephone Encounter (Signed)
Pt seen [facility] on 08/20/15 for increased sob.  Given pred taper. Sob did improve but has now increased off and on with some chest tightness and sneezing.   Denies cough or fever. Pt is on maintenance Pred 28m/day.   Please advise. Allergies  Allergen Reactions  . Aspirin Shortness Of Breath   Current Outpatient Prescriptions on File Prior to Visit  Medication Sig Dispense Refill  . clidinium-chlordiazePOXIDE (LIBRAX) 5-2.5 MG capsule TAKE ONE CAPSULE BY MOUTH 3 TIMES A DAY AS NEEDED 90 capsule 2  . clopidogrel (PLAVIX) 75 MG tablet Take 1 tablet (75 mg total) by mouth daily. 30 tablet 11  . COMBIVENT RESPIMAT 20-100 MCG/ACT AERS respimat INHALE 1 PUFF INTO THE LUNGS 2 (TWO) TIMES DAILY. 1 Inhaler 5  . hyoscyamine (LEVSIN SL) 0.125 MG SL tablet Take 1 tablet (0.125 mg total) by mouth every 4 (four) hours as needed for cramping. 120 tablet 11  . Investigational vitamin D 600 UNITS capsule SWOG S0812 Take 600 Units by mouth 2 (two) times daily. Take with food.    . methylphenidate (RITALIN) 10 MG tablet Take one pill po when you wake up and one pill 4 hours later 60 tablet 0  . mometasone-formoterol (DULERA) 100-5 MCG/ACT AERO Inhale 2 puffs into the lungs 2 (two) times daily. 3 Inhaler prn  . Na Sulfate-K Sulfate-Mg Sulf SOLN Take 1 kit by mouth as directed. 354 mL 0  . pantoprazole (PROTONIX) 40 MG tablet Take 1 tablet (40 mg total) by mouth 2 (two) times daily. 60 tablet 11  . predniSONE (DELTASONE) 10 MG tablet Take 10 mg by mouth daily with breakfast.    . sertraline (ZOLOFT) 25 MG tablet Take 1 tablet (25 mg total) by mouth daily. 30 tablet 3  . Spacer/Aero-Holding Chambers (AEROCHAMBER MV) inhaler Use as instructed 1 each 0   No current facility-administered medications on file prior to visit.

## 2015-09-18 NOTE — Telephone Encounter (Signed)
LMTC x 1  

## 2015-09-18 NOTE — Telephone Encounter (Signed)
Patient reports lower abdominal pressure with the urge to defecate and urinate "all the time".  She reports that even after a BM or urination the feeling is always there.  She is advised to see her primary care to rule out UTI.  She is scheduled for a colonoscopy for screening on 10/06/15.  Patient currently on Plavix and unable to move up her procedure at this time to accommodate the 5 day hold.

## 2015-09-22 ENCOUNTER — Telehealth: Payer: Self-pay | Admitting: Internal Medicine

## 2015-09-22 NOTE — Telephone Encounter (Signed)
Spoke with pt. States that when she called last week CY gave her instructions on how to take prednisone >> Try alternating prednisone 10 mg with 20 mg every other day for a week or 2. This was not helping so she took 40mg  on Saturday and Sunday. She took 30mg  today. She is still expereicing SOB, chest tightness, sinus headache and slight coughing. Pt wants CY's recommendations.  Allergies  Allergen Reactions  . Aspirin Shortness Of Breath   Current Outpatient Prescriptions on File Prior to Visit  Medication Sig Dispense Refill  . clidinium-chlordiazePOXIDE (LIBRAX) 5-2.5 MG capsule TAKE ONE CAPSULE BY MOUTH 3 TIMES A DAY AS NEEDED 90 capsule 2  . clopidogrel (PLAVIX) 75 MG tablet Take 1 tablet (75 mg total) by mouth daily. 30 tablet 11  . COMBIVENT RESPIMAT 20-100 MCG/ACT AERS respimat INHALE 1 PUFF INTO THE LUNGS 2 (TWO) TIMES DAILY. 1 Inhaler 5  . hyoscyamine (LEVSIN SL) 0.125 MG SL tablet Take 1 tablet (0.125 mg total) by mouth every 4 (four) hours as needed for cramping. 120 tablet 11  . Investigational vitamin D 600 UNITS capsule SWOG S0812 Take 600 Units by mouth 2 (two) times daily. Take with food.    . methylphenidate (RITALIN) 10 MG tablet Take one pill po when you wake up and one pill 4 hours later 60 tablet 0  . mometasone-formoterol (DULERA) 100-5 MCG/ACT AERO Inhale 2 puffs into the lungs 2 (two) times daily. 3 Inhaler prn  . pantoprazole (PROTONIX) 40 MG tablet Take 1 tablet (40 mg total) by mouth 2 (two) times daily. 60 tablet 11  . predniSONE (DELTASONE) 10 MG tablet Alternate 10mg  and 20 mg every other day x 2 weeks, then back to maintenance dose at 10mg  daily. 38 tablet 0  . sertraline (ZOLOFT) 25 MG tablet Take 1 tablet (25 mg total) by mouth daily. 30 tablet 3  . Spacer/Aero-Holding Chambers (AEROCHAMBER MV) inhaler Use as instructed 1 each 0   No current facility-administered medications on file prior to visit.    CY - please advise. Thanks.

## 2015-09-22 NOTE — Telephone Encounter (Signed)
lmtcb for pt.  

## 2015-09-22 NOTE — Telephone Encounter (Signed)
I suspect this is related to the pollen season, but cant tell. Can she come in to be seen by an NP, or go to her PCP? My schedule is pretty full.

## 2015-09-23 NOTE — Telephone Encounter (Signed)
Spoke with pt, scheduled ov for tomorrow with KW.  Nothing further needed.

## 2015-09-24 ENCOUNTER — Ambulatory Visit (INDEPENDENT_AMBULATORY_CARE_PROVIDER_SITE_OTHER): Payer: Managed Care, Other (non HMO) | Admitting: Adult Health

## 2015-09-24 ENCOUNTER — Encounter: Payer: Self-pay | Admitting: Adult Health

## 2015-09-24 ENCOUNTER — Telehealth: Payer: Self-pay | Admitting: Neurology

## 2015-09-24 VITALS — BP 126/86 | HR 90 | Temp 99.5°F | Ht 69.0 in | Wt 174.4 lb

## 2015-09-24 DIAGNOSIS — K219 Gastro-esophageal reflux disease without esophagitis: Secondary | ICD-10-CM | POA: Diagnosis not present

## 2015-09-24 DIAGNOSIS — J449 Chronic obstructive pulmonary disease, unspecified: Secondary | ICD-10-CM | POA: Diagnosis not present

## 2015-09-24 DIAGNOSIS — J301 Allergic rhinitis due to pollen: Secondary | ICD-10-CM | POA: Diagnosis not present

## 2015-09-24 MED ORDER — ALBUTEROL SULFATE HFA 108 (90 BASE) MCG/ACT IN AERS: 2.0000 | INHALATION_SPRAY | Freq: Four times a day (QID) | RESPIRATORY_TRACT | Status: DC | PRN

## 2015-09-24 MED ORDER — METHYLPHENIDATE HCL 10 MG PO TABS: ORAL_TABLET | ORAL | Status: DC

## 2015-09-24 MED ORDER — ALPRAZOLAM 0.25 MG PO TABS: 0.2500 mg | ORAL_TABLET | Freq: Three times a day (TID) | ORAL | Status: DC | PRN

## 2015-09-24 NOTE — Telephone Encounter (Signed)
Rx. awaiting RAS sig/fim 

## 2015-09-24 NOTE — Telephone Encounter (Signed)
Patient called to request refill of methylphenidate (RITALIN) 10 MG tablet

## 2015-09-24 NOTE — Patient Instructions (Addendum)
Continue GERD medicines  Continue dulera, combivent  Will add albuterol 2 puffs every 6 hours as needed for SOB/chest tightness  Will add low dose xanax - 0.25mg  by mouth three times daily as needed for anxiety related to SOB, chest tightness  Try over the counter allergy medication if allergy symptoms continue  Follow up with Dr. Annamaria Boots as previously scheduled  Please contact office for sooner follow up if symptoms do not improve or worsen or seek emergency care

## 2015-09-24 NOTE — Telephone Encounter (Signed)
Methylphenidate rx. up front [facility]/fim

## 2015-09-24 NOTE — Progress Notes (Signed)
Chief Complaint  Patient presents with  . Acute Visit    SOB, chest tightness, and sinus HA x 1 1/2 months.  No cough at this time or f/c/s,   TESTS:  PFT 07/20/2012-moderate obstructive airways disease with response to bronchodilator, normal lung volumes, diffusion mildly reduced. FVC 3.87/107%, FEV1 2.37/88%, FEV1/FVC 0.61, FEF 25-75% 1.12/39%. TLC 104%, DLCO 71%.  Past medical hx Past Medical History  Diagnosis Date  . Polyp of nasal cavity   . Extrinsic asthma, unspecified   . Allergic rhinitis, cause unspecified     failed allergy vaccine  . Unspecified sinusitis (chronic)   . 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 S/P dilatation of esophageal stricture 08-2008  . Hiatal hernia   . Diverticulosis   . PONV (postoperative nausea and vomiting)   . Anxiety     panic attacks  . GERD (gastroesophageal reflux disease)   . Arthritis   . Depression   . Vision abnormalities      Past surgical hx, Allergies, Family hx, Social hx all reviewed.  Current Outpatient Prescriptions on File Prior to Visit  Medication Sig  . clidinium-chlordiazePOXIDE (LIBRAX) 5-2.5 MG capsule TAKE ONE CAPSULE BY MOUTH 3 TIMES A DAY AS NEEDED (Patient taking differently: TAKE ONE CAPSULE By mouth twice daily)  . clopidogrel (PLAVIX) 75 MG tablet Take 1 tablet (75 mg total) by mouth daily.  . COMBIVENT RESPIMAT 20-100 MCG/ACT AERS respimat INHALE 1 PUFF INTO THE LUNGS 2 (TWO) TIMES DAILY.  . hyoscyamine (LEVSIN SL) 0.125 MG SL tablet Take 1 tablet (0.125 mg total) by mouth every 4 (four) hours as needed for cramping.  . mometasone-formoterol (DULERA) 100-5 MCG/ACT AERO Inhale 2 puffs into the lungs 2 (two) times daily.  . pantoprazole (PROTONIX) 40 MG tablet Take 1 tablet (40 mg total) by mouth 2 (two) times daily.  . predniSONE (DELTASONE) 10 MG tablet Alternate 10mg  and 20 mg every other day x 2 weeks, then back to maintenance dose at 10mg  daily.  . sertraline (ZOLOFT) 25 MG tablet Take 1 tablet  (25 mg total) by mouth daily.  Marland Kitchen Spacer/Aero-Holding Chambers (AEROCHAMBER MV) inhaler Use as instructed  . Investigational vitamin D 600 UNITS capsule SWOG S0812 Take 600 Units by mouth 2 (two) times daily. Reported on 09/24/2015   No current facility-administered medications on file prior to visit.     Vital Signs BP 126/86 mmHg  Pulse 90  Temp(Src) 99.5 F (37.5 C) (Oral)  Ht 5\' 9"  (1.753 m)  Wt 174 lb 6.4 oz (79.107 kg)  BMI 25.74 kg/m2  SpO2 95%  History of Present Illness [name] [name] is a 65 y.o. female with hx COPD/ asthma, allergic rhinitis maintained on dulera, combivent and chronic prednisone 10mg /day.  Also hx significant GERD (followed by Dr. Fuller Plan) and anxiety.  Called office last week with more SOB, chest tightness and rx with prednisone taper.  She is on her last day of 20mg  and will drop back to maintenance 10mg  tomorrow.   She returns today for acute OV with c/o intermittent chest tightness, SOB.  This happens periodically and is usually after a meal and associated with GERD symptoms as well.  Symptoms usually last ~1 hour after eating and then resolve.  No SOB at rest or worsening with exertion.  Has some mild, clear nasal drainage but does not want to take anti-histamine yet as the symptoms are not "that bad".    Denies overt chest pain, arm pain, jaw pain, n/v, diaphoresis, purulent sputum,  sinus tenderness, fevers/chills, orthopnea, edema.    Physical Exam  General - pleasant female, No distress  ENT - No sinus tenderness, no oral exudate, no LAN Cardiac - s1s2 regular, no murmur Chest - resps even non labored, No wheeze/rales/dullness Back - No focal tenderness Abd - Soft, non-tender Ext - No edema Neuro - Normal strength Skin - No rashes Psych - anxious intermittently   Assessment/Plan  Mod COPD - likely exacerbated by GERD as well as large component of anxiety.  Already on max PPI, followed closely by GI. Zoloft added at last neuro appt.  Will add  PRN SABA and low dose xanax.  GERD - ?esophageal spasms  Allergic rhinitis   Patient Instructions  Continue GERD medicines  Continue dulera, combivent  Will add albuterol 2 puffs every 6 hours as needed for SOB/chest tightness  Will add low dose xanax - 0.25mg  by mouth three times daily as needed for anxiety related to SOB, chest tightness  Try over the counter allergy medication if allergy symptoms continue  Follow up with Dr. Annamaria Boots as previously scheduled  Please contact office for sooner follow up if symptoms do not improve or worsen or seek emergency care    Nickolas Madrid, NP 09/24/2015  12:30 PM

## 2015-09-30 ENCOUNTER — Ambulatory Visit: Payer: Managed Care, Other (non HMO) | Admitting: Neurology

## 2015-10-06 ENCOUNTER — Encounter: Payer: Self-pay | Admitting: Gastroenterology

## 2015-10-06 ENCOUNTER — Ambulatory Visit (AMBULATORY_SURGERY_CENTER): Payer: Managed Care, Other (non HMO) | Admitting: Gastroenterology

## 2015-10-06 VITALS — BP 120/78 | HR 74 | Temp 96.9°F | Resp 22 | Ht 69.0 in | Wt 176.0 lb

## 2015-10-06 DIAGNOSIS — Z86010 Personal history of colonic polyps: Secondary | ICD-10-CM | POA: Diagnosis not present

## 2015-10-06 MED ORDER — SODIUM CHLORIDE 0.9 % IV SOLN: 500.0000 mL | INTRAVENOUS | Status: DC

## 2015-10-06 NOTE — Progress Notes (Signed)
Report given to PACU RN, vss 

## 2015-10-06 NOTE — Progress Notes (Signed)
Patient to restroom during admission process. Patient stating she did use 2 puffs of albuteral inhaler.

## 2015-10-06 NOTE — Patient Instructions (Signed)
YOU HAD AN ENDOSCOPIC PROCEDURE TODAY AT [facility]:   Refer to the procedure report that was given to you for any specific questions about what was found during the examination.  If the procedure report does not answer your questions, please call your gastroenterologist to clarify.  If you requested that your care partner not be given the details of your procedure findings, then the procedure report has been included in a sealed envelope for you to review at your convenience later.  YOU SHOULD EXPECT: Some feelings of bloating in the abdomen. Passage of more gas than usual.  Walking can help get rid of the air that was put into your GI tract during the procedure and reduce the bloating. If you had a lower endoscopy (such as a colonoscopy or flexible sigmoidoscopy) you may notice spotting of blood in your stool or on the toilet paper. If you underwent a bowel prep for your procedure, you may not have a normal bowel movement for a few days.  Please Note:  You might notice some irritation and congestion in your nose or some drainage.  This is from the oxygen used during your procedure.  There is no need for concern and it should clear up in a day or so.  SYMPTOMS TO REPORT IMMEDIATELY:   Following lower endoscopy (colonoscopy or flexible sigmoidoscopy):  Excessive amounts of blood in the stool  Significant tenderness or worsening of abdominal pains  Swelling of the abdomen that is new, acute  Fever of 100F or higher   For urgent or emergent issues, a gastroenterologist can be reached at any hour by calling [phone].   DIET: Your first meal following the procedure should be a small meal and then it is ok to progress to your normal diet. Heavy or fried foods are harder to digest and may make you feel nauseous or bloated.  Likewise, meals heavy in dairy and vegetables can increase bloating.  Drink plenty of fluids but you should avoid alcoholic beverages for 24  hours.  ACTIVITY:  You should plan to take it easy for the rest of today and you should NOT DRIVE or use heavy machinery until tomorrow (because of the sedation medicines used during the test).    FOLLOW UP: Our staff will call the number listed on your records the next business day following your procedure to check on you and address any questions or concerns that you may have regarding the information given to you following your procedure. If we do not reach you, we will leave a message.  However, if you are feeling well and you are not experiencing any problems, there is no need to return our call.  We will assume that you have returned to your regular daily activities without incident.  If any biopsies were taken you will be contacted by phone or by letter within the next 1-3 weeks.  Please call us at [phone] if you have not heard about the biopsies in 3 weeks.    SIGNATURES/CONFIDENTIALITY: You and/or your care partner have signed paperwork which will be entered into your electronic medical record.  These signatures attest to the fact that that the information above on your After Visit Summary has been reviewed and is understood.  Full responsibility of the confidentiality of this discharge information lies with you and/or your care-partner.  Resume your plavix today per Dr. Fuller Plan.    Thank-you for choosing [place] for your healthcare needs today.

## 2015-10-06 NOTE — Progress Notes (Signed)
Patient stating no dyspnea," just prep has acted up her acid reflux, irritating her COPD".

## 2015-10-06 NOTE — Progress Notes (Addendum)
Patient states pain of #7 abd; abd is soft; vitals are stable; levsin given with no results.  I attempted to put in a rectal tube and the patient made me take it out "because it hurt."  Turned back and forth in bed to no avail.  Dr. Fuller Plan in to evaluate the patient, and he suggested that she go to the bathroom.  If that doesn't work, she is to walk around the area. Patient is up and to the bathroom. Refused to have [place] in there with her.  Husband states that "she tends to be a little dramatic."   Husband is walking patient around the unit.  She states that it is "a little better, but she is smiling."  I told her that she needs to walk around the house this evening and drink warm fluids.  She is to stay away from gassy foods.  Husband agreed at this time.  Patient passed gas and fluid.  She stated that she was "ready for a hot dog now."  I told her not to eat anything gassy.

## 2015-10-06 NOTE — Op Note (Signed)
[facility] Patient Name: [name] [name] Procedure Date: 10/06/2015 2:57 PM MRN: [id number] Endoscopist: Ladene Artist , MD Age: 65 Date of Birth: [birth year] Gender: Female Procedure:                Colonoscopy Indications:              Surveillance: Personal history of adenomatous                            polyps on last colonoscopy > 3 years ago Medicines:                Monitored Anesthesia Care Procedure:                Pre-Anesthesia Assessment:                           - Prior to the procedure, a History and Physical                            was performed, and patient medications and                            allergies were reviewed. The patient's tolerance of                            previous anesthesia was also reviewed. The risks                            and benefits of the procedure and the sedation                            options and risks were discussed with the patient.                            All questions were answered, and informed consent                            was obtained. Prior Anticoagulants: The patient has                            taken Plavix (clopidogrel), last dose was 5 days                            prior to procedure. ASA Grade Assessment: II - A                            patient with mild systemic disease. After reviewing                            the risks and benefits, the patient was deemed in                            satisfactory condition to undergo the procedure.  After obtaining informed consent, the colonoscope                            was passed under direct vision. Throughout the                            procedure, the patient's blood pressure, pulse, and                            oxygen saturations were monitored continuously. The                            Model PCF-H190DL [id number]) scope was introduced                            through the anus and advanced to the the  cecum,                            identified by appendiceal orifice and ileocecal                            valve. The ileocecal valve, appendiceal orifice,                            and rectum were photographed. The quality of the                            bowel preparation was adequate to identify polyps                            after extensive rinsing and suctioning. The                            colonoscopy was performed without difficulty. The                            patient tolerated the procedure well. Scope In: 3:01:03 PM Scope Out: 3:16:21 PM Scope Withdrawal Time: 0 hours 10 minutes 14 seconds  Total Procedure Duration: 0 hours 15 minutes 18 seconds  Findings:                 The entire examined colon appeared normal on direct                            and retroflexion views. Complications:            No immediate complications. Estimated blood loss:                            None. Estimated Blood Loss:     Estimated blood loss: none. Impression:               - The entire examined colon is normal on direct and  retroflexion views. Recommendation:           - Repeat colonoscopy in 5 years for surveillance                            with a more extensive bowel prep.                           - Patient has a contact number available for                            emergencies. The signs and symptoms of potential                            delayed complications were discussed with the                            patient. Return to normal activities tomorrow.                            Written discharge instructions were provided to the                            patient.                           - Resume previous diet.                           - Continue present medications.                           - Resume Plavix (clopidogrel) at prior dose today.                            Refer to managing physician for further adjustment                             of therapy. Ladene Artist, MD 10/06/2015 3:20:36 PM This report has been signed electronically.

## 2015-10-07 ENCOUNTER — Telehealth: Payer: Self-pay | Admitting: *Deleted

## 2015-10-07 NOTE — Telephone Encounter (Signed)
  Follow up Call-  Call back number 10/06/2015  Post procedure Call Back phone  # [phone]  Permission to leave phone message Yes     Patient questions:  Do you have a fever, pain , or abdominal swelling? No. Pain Score  0 *  Have you tolerated food without any problems? Yes.    Have you been able to return to your normal activities? Yes.    Do you have any questions about your discharge instructions: Diet   No. Medications  No. Follow up visit  No.  Do you have questions or concerns about your Care? No.  Actions: * If pain score is 4 or above: No action needed, pain <4.

## 2015-10-16 ENCOUNTER — Encounter: Payer: Managed Care, Other (non HMO) | Admitting: Gastroenterology

## 2015-10-18 ENCOUNTER — Other Ambulatory Visit: Payer: Self-pay | Admitting: Internal Medicine

## 2015-10-20 NOTE — Telephone Encounter (Signed)
CY Please advise if you want patient to take Rx as it's written or have new Rx sent to take Prednisone 10 mg 1 tablet QD. Thanks.

## 2015-10-21 ENCOUNTER — Telehealth: Payer: Self-pay | Admitting: Internal Medicine

## 2015-10-21 MED ORDER — PREDNISONE 10 MG PO TABS: 10.0000 mg | ORAL_TABLET | Freq: Every day | ORAL | Status: DC

## 2015-10-21 NOTE — Telephone Encounter (Signed)
Ok to refill 

## 2015-10-21 NOTE — Telephone Encounter (Signed)
Patient calling to get refill on Prednisone.  Patient takes maintenance dose of 10mg  daily.   Rx sent to pharmacy. Patient aware. Nothing further needed.

## 2015-10-22 ENCOUNTER — Telehealth: Payer: Self-pay | Admitting: Internal Medicine

## 2015-10-22 MED ORDER — PREDNISONE 10 MG PO TABS: 10.0000 mg | ORAL_TABLET | Freq: Every day | ORAL | Status: DC

## 2015-10-22 NOTE — Telephone Encounter (Signed)
Per pt prednisone was sent in with no refills. This is a maintenance medication for her so she does need refills. Per chart rx was sent yesterday with no refills and then another rx with different sig was sent today with no refills. Canceled 2nd rx as it was old sig. Called pharmacy to notify and sent refill on daily pred. Nothing further needed.

## 2015-10-28 ENCOUNTER — Other Ambulatory Visit: Payer: Self-pay | Admitting: Neurology

## 2015-10-28 MED ORDER — METHYLPHENIDATE HCL 10 MG PO TABS: ORAL_TABLET | ORAL | Status: DC

## 2015-10-28 NOTE — Telephone Encounter (Signed)
Patient requesting refill of methylphenidate (RITALIN) 10 MG tablet. ° ° °

## 2015-10-28 NOTE — Telephone Encounter (Signed)
Rx printed and put on desk next to printer for signature.

## 2015-10-28 NOTE — Telephone Encounter (Signed)
Ritalin rx. up front [facility] 

## 2015-12-01 ENCOUNTER — Telehealth: Payer: Self-pay | Admitting: Neurology

## 2015-12-01 MED ORDER — METHYLPHENIDATE HCL 10 MG PO TABS: ORAL_TABLET | ORAL | Status: DC

## 2015-12-01 NOTE — Telephone Encounter (Signed)
Ritalin rx. up front [facility] 

## 2015-12-01 NOTE — Telephone Encounter (Signed)
Patient called to request refill of methylphenidate (RITALIN) 10 MG tablet

## 2015-12-01 NOTE — Telephone Encounter (Signed)
Rx. awaiting RAS sig/fim 

## 2015-12-08 ENCOUNTER — Telehealth: Payer: Self-pay | Admitting: Internal Medicine

## 2015-12-08 NOTE — Telephone Encounter (Signed)
Let her try sample of Stiolto    Inhale 2 puffs once daily  OR Bevespi     Inhale 2 puffs, twice daily

## 2015-12-08 NOTE — Telephone Encounter (Signed)
Sample of Stiolto has been left at the front desk for pick up. Pt is aware of medication change. Nothing further was needed at this time.

## 2015-12-08 NOTE — Telephone Encounter (Signed)
Spoke with pt. States that [facility] is causing her tongue to burning and making her mouth dry. She is rinsing her mouth out after each use. Pt would like CY's recommendations.  CY - please advise. Thanks.

## 2015-12-16 ENCOUNTER — Other Ambulatory Visit: Payer: Self-pay | Admitting: Internal Medicine

## 2015-12-17 ENCOUNTER — Other Ambulatory Visit: Payer: Self-pay

## 2015-12-17 DIAGNOSIS — H9113 Presbycusis, bilateral: Secondary | ICD-10-CM | POA: Insufficient documentation

## 2015-12-18 ENCOUNTER — Ambulatory Visit (INDEPENDENT_AMBULATORY_CARE_PROVIDER_SITE_OTHER): Payer: Managed Care, Other (non HMO) | Admitting: Internal Medicine

## 2015-12-18 ENCOUNTER — Encounter: Payer: Self-pay | Admitting: Internal Medicine

## 2015-12-18 VITALS — BP 130/72 | HR 104 | Ht 69.0 in | Wt 171.6 lb

## 2015-12-18 DIAGNOSIS — J45998 Other asthma: Secondary | ICD-10-CM | POA: Diagnosis not present

## 2015-12-18 DIAGNOSIS — J301 Allergic rhinitis due to pollen: Secondary | ICD-10-CM | POA: Diagnosis not present

## 2015-12-18 DIAGNOSIS — Z886 Allergy status to analgesic agent status: Principal | ICD-10-CM

## 2015-12-18 DIAGNOSIS — J449 Chronic obstructive pulmonary disease, unspecified: Secondary | ICD-10-CM | POA: Diagnosis not present

## 2015-12-18 DIAGNOSIS — J339 Nasal polyp, unspecified: Secondary | ICD-10-CM

## 2015-12-18 DIAGNOSIS — J45909 Unspecified asthma, uncomplicated: Secondary | ICD-10-CM

## 2015-12-18 NOTE — Assessment & Plan Note (Signed)
She feels adequately controlled using Dulera 100. Using this with spacer tube reduces her problems with yeast stomatitis.

## 2015-12-18 NOTE — Assessment & Plan Note (Signed)
No polyps on today's exam and apparently none seen by her ENT

## 2015-12-18 NOTE — Patient Instructions (Addendum)
Ok to continue with your ENT doctor for sinus discomfort  Ok to continue the [facility] 100 with your spacer tuce and mouth rinsing  Please call if we can help

## 2015-12-18 NOTE — Progress Notes (Signed)
Subjective:    Patient ID: [name] [name], female    DOB: 01/15/1951, 65 y.o.   MRN: [id number]  HPI 10/14/10- 12 yoF with asthma, allergic rhinitis.  Last here August 31, 2010- Because of problems with GERD and yeast we were hoping to reduce prednisone which she has been on for much of the time since the 1970's. She has maintained mostly on 5 mg daily. We gave 1 mg tabs, but she didn't feel that 4 mg held her.  Heart burn is better and congestion a bit worse. Pollen season usually gets her some congested. She had been on Xolair x 3 years, then wheezed worse and it was stopped.  Today she feels ok, but changes day-to-day. Main trigger for her she thinks is temperature change/ weather. [place] trip - wheezed in [place].   11/13/10- Asthma, allergic rhinitis/ recurrent sinusitis Called last week and we had her take prednisone 10 mg daily x 7 days then go back to 5 mg daily. She says 5 mg isn't enough. Post nasal drip is getting in her lungs and requiring use of rescue inhaler several times daily. Denies dyspnea or headache. Occasional throat clearing of yellow mucus. Little sustained wheeze, just feels tight.  01/11/11- 59 yoF former smoker, followed for asthma, allergic rhinitis/ hx nasal polyps/ aspirin allergy, recurrent sinusitis complicated by GERD Maintenance prednisone was on 5 mg daily, then went up to 40 and now back to 20/day.  Feels fine now, but last week throat tickle progressed into wheeze and she called [place]. [place] home[place], basement, carpet. Says there was mold , but she can't smell so she can't tell. Carpet was replaced and visible leak fixed. Also aware of reflux- diet controlled and continues Zegerid.   04/13/11- 59 yoF former smoker, followed for asthma, allergic rhinitis/ hx nasal polyps/ aspirin allergy, recurrent sinusitis complicated by GERD And she declines flu vaccine. She tried Xolair injections for 3 years with no benefit. At last visit we tried tapering  prednisone to maintenance 5 mg daily. She developed increasing nasal congestion and postnasal drainage so she went back up to 10 mg of prednisone daily. With that she feels better but she says that is not enough prednisone to restore her sense of smell. Using rescue inhaler about one time daily for tickle and chest tightness. Daily Water Pik for her nose returns some yellow material. She does not feel that she has a sinus infection. She has not had recent followup with Dr. Erik Obey ENT and was to see him again as needed. He had told her her nasal problems were allergy  .+nasal congestion, post nasal drip,  11/16/11-  36 yoF former smoker, followed for asthma, allergic rhinitis/ hx nasal polyps/ aspirin allergy, recurrent sinusitis complicated by GERD She is past the spring seasonal rhinitis time for her. Had a skin rash diagnosed as "allergic" at an urgent care they increase prednisone and gave cortisone cream but she is now back to her maintenance prednisone 10 mg daily. The rash is better and she feels fairly stable. Denies wheeze or cough. Still no sense of smell even on a prednisone 60 mg taper.  03/16/2012 Acute OV  Presents for an acute office visit.  Called in 10/1 with 3 weeks of cough /wheezing, prednisone taper sent  She returns today feeling some better but still has some drainage w/ tickle in throat and throat clearing often.  Went to UC initially  w/ depo x2, pred pak and zpak.  Denies cough, SOB, tightness in chest. Or edema.  Currently healing from Rigth thumb fx in cast  Nasal sprays not helping.   06/01/12- 63 yoF former smoker, followed for asthma, allergic rhinitis/ hx nasal polyps/ aspirin allergy, recurrent sinusitis complicated by GERD folllows for : pt states still having gerd like sxs since having gallbladder surg 05-05-12.  states has been having some sob,wheezing,.throat clearing.chest tightness. denies any cough ,fever, When she returned to her GERD diet after gallbladder  surgery, she began experiencing tightness in throat and upper chest. This feeling comes and goes and has noticed also on waking. She is continued chronic maintenance prednisone 10 mg daily after repeatedly trying to reduce the dose and feeling that her breathing deteriorated. Rescue inhaler has little effect. Minor throat tickle. Denies coughing. She continues omeprazole and Carafate and does not feel heartburn. She does not distinguish well between reflux and asthma symptoms CXR 05/01/12-reviewed IMPRESSION:  No evidence for acute cardiopulmonary abnormality.  Original Report Authenticated By: Nolon Nations, M.D.  Office Spirometry 06/01/12- moderate obstructive airways disease. FVC 3.15/85%, FEV1 1.87/65%, FEV1/FVC 0.59% FEF 25-75% 0.92/36% PFT 02/01/08- we compared her old PFT-mild obstructive airways disease with minimal response to bronchodilator. FVC 4.14/107%, FEV1 2.83/98%, FEV1/FVC 0.68, FEF 25-75% 1.72/56%. Scores have declined.  07/20/12- 61 yoF former smoker, followed for asthma, allergic rhinitis/ hx nasal polyps/ aspirin allergy, recurrent sinusitis complicated by GERD FOLLOWS FOR: review 6Mw and PFT results with patient. She says she is "frustrated" by her shortness of breath, but not coughing. Still on chronic prednisone 10 mg daily. She continues to notice GERD. Albuterol helps chest tightness. She asks primary care referral. PFT 07/20/2012-moderate obstructive airways disease with response to bronchodilator, normal lung volumes, diffusion mildly reduced. FVC 3.87/107%, FEV1 2.37/88%, FEV1/FVC 0.61, FEF 25-75% 1.12/39%. TLC 104%, DLCO 71%. 6MWT 07/20/2012-98%, 97%, 98%, 444 m. Noted hip pain. No oxygen limitation.  08/10/12- 61 yoF former smoker, followed for asthma, allergic rhinitis/ hx nasal polyps/ aspirin allergy, recurrent sinusitis complicated by GERD FOLLOWS FOR: states Dulera is not working now; was not feeling as much chest tightness with use but now is having increased and  worsening tightness in chest; denies any cough or wheezing. She declined home nebulizer saying she had tried it before with no benefit. Echocardiogram 08/03/2012-EF 32-20%, gr 1 diastolic dysfunction. No pulmonary hypertension or wall motion abnormality.  09/12/12- 61 yoF former smoker, followed for asthma, allergic rhinitis/ hx nasal polyps/ aspirin allergy, recurrent sinusitis complicated by GERD FOLLOWS FOR: still having chest tightness. Denies any wheezing or cough. Has recently noticed slight tickly/clearing throat since being around sick co worker. Admits she has been worried about her respiratory prognosis since diagnosed with COPD. Which she was exposed to a friend with a cold, she began using an antibiotic she had a home and Mucinex. She still feels somewhat tight without pain. She failed Spiriva but continues Dulera and remains on prednisone 10 mg daily maintenance which we discussed again. GI give her Levsin for possible esophageal spasm with minimal benefit. Dr. Ronnald Ramp treated with Zoloft but it just made her nervous.  12/21/12-61 yoF former smoker, followed for asthma, allergic rhinitis/ hx nasal polyps/ aspirin allergy, recurrent sinusitis complicated by GERD FOLLOW URK:[facility] tightness better with Librax still on 3/day,denies sob,wheezing, and cough Continues prednisone 10 mg daily and has not felt she could wean off. We discussed steroid side effects again. Had bone density.  Denies shortness of breath, cough or wheeze.  07/27/13- 61 yoF former smoker, followed for asthma, allergic rhinitis/  hx nasal polyps/ aspirin allergy, recurrent sinusitis complicated by GERD FOLLOWS FOR:  Breathing doing well -- Discuss exercising Feeling very much better with less cough and chest tightness. Uses Librax 3 times daily as needed for chest tightness, and occasional Levsin with Protonix twice daily.  11/26/13- 62 yoF former smoker, followed for asthma/ COPD, allergic rhinitis/ hx nasal polyps/ aspirin  allergy, recurrent sinusitis complicated by GERD FOLLOWS FOR: green drainage from sinus' for about 1.5 months. Last night she felt tight in chest (states she had BBQ over the weekend and wonders if GERD flared up her COPD). No headache. Says she feels well now. Stays on prednisone 10 mg daily for lungs. Bone density was checked. Continues Dulera but persistent thrush.  04/25/2014 Acute OV  Former smoker, followed for asthma/ COPD, allergic rhinitis/ hx nasal polyps/ aspirin allergy, recurrent sinusitis complicated by GERD Complains of increased SOB, HA, sneezing, dry cough, tightness x 5 days , Worse this am . Denies f/c/s, chest pain n/v/d, purulent sputum, PND, leg swelling. On prednisone 10mg  daily and Dulera  No recent travel or antibiotic use  12/17/14- 62 yoF former smoker, followed for asthma/ COPD, allergic rhinitis/ hx nasal polyps/ aspirin allergy, recurrent sinusitis complicated by GERD FOLLOW FOR: sneezing, blowing out yellow mucus; some cough.  Blames sleeping under a fan for 1 month of nasal congestion with light yellow discharge. She started cephalexin 500 mg 4 times daily/10 days from her ENT doctor. Yesterday took prednisone 20 mg because her throat was sore. Her maintenance doses prednisone 10 mg daily. Images from recent CT of her head were reviewed, noting sinuses. CT head 12/11/14 The orbits and their contents, paranasal sinuses and calvarium are unremarkable.  IMPRESSION:  Abnormal CT head (without) demonstrating: 1. Moderate-severe periventricular and subcortical hypodensities. These findings are non-specific and considerations include autoimmune, inflammatory, post-infectious or microvascular ischemic etiologies.  2. No significant atrophy.  3. No acute findings.  04/02/15-  62 yoF former smoker, followed for asthma/ COPD, allergic rhinitis/ hx nasal polyps/ aspirin allergy, recurrent sinusitis complicated by GERD ACUTE: Follows For: pt states she has some congestion  and stuffy nose. pt c/o dry cough, and at times feels mucus coming down her throat she spits out and is yellow.. no c/o chest tightness, SOB or wheezing.   pt states shes been haviing some itching all over he body unsure if its allergies.  06/19/2015-65 year old female former smoker followed for asthma/COPD, allergic rhinitis/history nasal polyps/aspirin allergy, recurrent sinusitis complicated by GERD FOLLOWS FOR: pt. states she is coughing up green mucus. hoarseness.  no c/o congestion, chest tightness, SOB or wheezing. 1 week acute cough with green sputum but no fever. Sore throat. Hoarse. Prednisone  taper back to 10 mg maintenance daily. On Z-Pak now  12/18/2015-65 year old female former smoker followed for asthma/COPD, allergic rhinitis/history nasal polyps/aspirin allergy, recurrent sinusitis, complicated by GERD LOV [id number] COPD likely exacerbated by GERD and anxiety, followed by GI with Zoloft added by neurology. Was given low-dose Xanax. To continue Dulera, Combivent and given albuterol rescue inhaler. Given methylphenidate by neurology. FOLLOWS FOR: Pt states she went back using Dulera with her aerochamber-to help with mouth issues. Pt continues to have issues with sinus-went to ENT yesterday and waiting on culture to come back as she has been on 3 different abx's without help. FeNO-12/18/15- 13  ROS-see HPI   Negative unless "+" Constitutional:    weight loss, night sweats, fevers, chills, fatigue, lassitude. HEENT:    headaches, difficulty swallowing, tooth/dental problems, sore throat,  sneezing, itching, ear ache, +nasal congestion, post nasal drip, snoring CV:    chest pain, orthopnea, PND, swelling in lower extremities, anasarca,                                                     dizziness, palpitations Resp:   shortness of breath with exertion or at rest.                productive cough,   non-productive cough, coughing up of blood.              change in color  of mucus.  wheezing.   Skin:    rash or lesions. GI:  No-   heartburn, indigestion, abdominal pain, nausea, vomiting,  GU:  MS:   joint pain, stiffness,  Neuro-     nothing unusual Psych:  change in mood or affect.  depression or anxiety.   memory loss.  OBJ- Physical Exam General- Alert, Oriented, Affect-appropriate, Distress- none acute Skin- rash-none, lesions- none, excoriation- none Lymphadenopathy- none Head- atraumatic            Eyes- Gross vision intact, PERRLA, conjunctivae and secretions clear            Ears- Hearing, canals-normal            Nose- not- stuffy, no-Septal dev, mucus, polyps, erosion, perforation             Throat- Mallampati III , mucosa clear , drainage- none, tonsils- atrophic, + hoarse                         , + dentures Neck- flexible , trachea midline, no stridor , thyroid nl, carotid no bruit Chest - symmetrical excursion , unlabored           Heart/CV- RRR , no murmur , no gallop  , no rub, nl s1 s2                           - JVD- none , edema- none, stasis changes- none, varices- none           Lung- clear to P&A, wheeze- none, cough +, dullness-none, rub- none           Chest wall-  Abd-  Br/ Gen/ Rectal- Not done, not indicated Extrem- cyanosis- none, clubbing, none, atrophy- none, strength- nl Neuro- grossly intact to observation

## 2015-12-18 NOTE — Assessment & Plan Note (Signed)
Variable nasal congestion is probably vasomotor at this time. She hasn't recognized significant mold or allergen exposures. We have had many conversations about these issues in the past.

## 2016-01-06 ENCOUNTER — Other Ambulatory Visit: Payer: Self-pay | Admitting: Internal Medicine

## 2016-01-06 ENCOUNTER — Telehealth: Payer: Self-pay | Admitting: Neurology

## 2016-01-06 ENCOUNTER — Telehealth: Payer: Self-pay | Admitting: Internal Medicine

## 2016-01-06 MED ORDER — METHYLPHENIDATE HCL 10 MG PO TABS: ORAL_TABLET | ORAL | 0 refills | Status: DC

## 2016-01-06 NOTE — Telephone Encounter (Signed)
Patient requesting refill of methylphenidate (RITALIN) 10 MG tablet Pharmacy: pick up

## 2016-01-06 NOTE — Telephone Encounter (Signed)
Called spoke with pt. She states that she restarted on [facility] and that since starting it her tongue started burning again. She states that she is going to start the stiolto sample that she was given and call us back after a week of using it to let us know if it seems to be working. She voiced understanding and had no further questions.

## 2016-01-06 NOTE — Telephone Encounter (Signed)
Rx. awaiting RAS sig/fim 

## 2016-01-06 NOTE — Telephone Encounter (Signed)
Ritalin rx. up front [facility] 

## 2016-01-07 NOTE — Telephone Encounter (Signed)
Ok refill 6 months 

## 2016-01-07 NOTE — Telephone Encounter (Signed)
CY Please advise if okay to refill. Thanks.  

## 2016-01-09 ENCOUNTER — Telehealth: Payer: Self-pay | Admitting: Internal Medicine

## 2016-01-09 NOTE — Telephone Encounter (Signed)
Spoke with pt, requesting the status on Librax rx refill.  I advised that per her chart this was sent in about 2 hours ago to preferred and verified pharmacy.  Pt expressed understanding.  Nothing further needed.

## 2016-01-20 ENCOUNTER — Ambulatory Visit: Payer: Managed Care, Other (non HMO) | Admitting: Neurology

## 2016-01-26 ENCOUNTER — Telehealth: Payer: Self-pay | Admitting: Internal Medicine

## 2016-01-26 NOTE — Telephone Encounter (Signed)
lmtcb x1 for pt. 

## 2016-01-27 MED ORDER — TIOTROPIUM BROMIDE-OLODATEROL 2.5-2.5 MCG/ACT IN AERS: 2.0000 | INHALATION_SPRAY | Freq: Every day | RESPIRATORY_TRACT | 0 refills | Status: DC

## 2016-01-27 NOTE — Telephone Encounter (Signed)
Patient calling to get samples of Stiolto and financial assistance paperwork to complete for assistance with copay.  Gave patient 2 samples of Stiolto and paperwork. Patient will pick up.  Left at front desk.  Patient states she will complete paperwork and bring back for [place] to complete for her financial assistance.  Advised her that she will need to attach financial proof with the paperwork.  Patient aware.

## 2016-01-27 NOTE — Telephone Encounter (Signed)
lmomtcb x 2  For pt

## 2016-02-05 ENCOUNTER — Ambulatory Visit (INDEPENDENT_AMBULATORY_CARE_PROVIDER_SITE_OTHER): Payer: Managed Care, Other (non HMO) | Admitting: Neurology

## 2016-02-05 ENCOUNTER — Encounter: Payer: Self-pay | Admitting: Neurology

## 2016-02-05 VITALS — BP 136/90 | HR 68 | Resp 16 | Ht 69.0 in | Wt 169.5 lb

## 2016-02-05 DIAGNOSIS — R413 Other amnesia: Secondary | ICD-10-CM

## 2016-02-05 DIAGNOSIS — R41840 Attention and concentration deficit: Secondary | ICD-10-CM | POA: Diagnosis not present

## 2016-02-05 DIAGNOSIS — F418 Other specified anxiety disorders: Secondary | ICD-10-CM | POA: Diagnosis not present

## 2016-02-05 MED ORDER — METHYLPHENIDATE HCL 10 MG PO TABS: ORAL_TABLET | ORAL | 0 refills | Status: DC

## 2016-02-05 MED ORDER — BUPROPION HCL ER (XL) 150 MG PO TB24: 150.0000 mg | ORAL_TABLET | Freq: Every day | ORAL | 11 refills | Status: DC

## 2016-02-05 NOTE — Progress Notes (Signed)
[facility]  PATIENT: [name] [name] DOB: 1951-01-07  REFERRING DOCTOR OR PCP:  Ricke Hey SOURCE: patients and records form [facility] records  _________________________________   HISTORICAL  CHIEF COMPLAINT:  Chief Complaint  Patient presents with  . Memory Loss    Sts. focus/concentration continues to be improved on Ritalin.  Sts. depression is about the same.  She stopped Lexapro because it was making her sleepy during the day/fim  . Depression    HISTORY OF PRESENT ILLNESS:  [name] [name] is a 65 yo woman who reporting memory decline. Short term memory issues are her biggest concern.    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  E doesn't remember to make lists most days.  She uses some reminders Holiday representative notes)   She denies any difficulty doing math.  She has not become lost while driving. She has not had difficulty remembering to turn appliances off.   Attention and focus are poor.    Ritalin and feels it helped her focus but not her memory.      Her mother was in her 27s when she had Alzheimer's disease. Her sister, brother and aunt all had strokes.     CT scan 12/10/2014 showed moderate to severe periventricular subcortical white matter changes consistent with chronic microvascular ischemia.    Mood:   She has had depression and gets frustrated easily.  She feels her husband hasn't been responsive enough to her problems.   She gets frustrated easily, especially at her husband.   She stopped escitalopram and had more mood swings.    Sertraline made her sleepy   Sleep:    She feels that she falls asleep and stays asleep well. Husband has not noted her snoring or gasping for air at night.   REVIEW OF SYSTEMS: Constitutional: No fevers, chills, sweats, or change in appetite.    Some fatigue Eyes: No visual changes, double vision, eye pain Ear, nose and throat: No hearing loss, ear pain, nasal congestion, sore  throat Cardiovascular: No chest pain, palpitations Respiratory: reports shortness of breath and wheezes   No snores GastrointestinaI: No nausea, vomiting, diarrhea, abdominal pain, fecal incontinence Genitourinary: No dysuria, urinary retention or frequency.  No nocturia. Musculoskeletal: No neck pain, back pain but notes pain in the hips and knees. . Integumentary: No rash, pruritus, skin lesions Neurological: as above Psychiatric: Notes depression at this time but no anxiety Endocrine: No palpitations, diaphoresis, change in appetite, change in weigh or increased thirst Hematologic/Lymphatic: No anemia, purpura, petechiae. Allergic/Immunologic: No itchy/runny eyes, nasal congestion,   She has some seasonal allergies.  ALLERGIES: Allergies  Allergen Reactions  . Aspirin Shortness Of Breath    HOME MEDICATIONS:  Current Outpatient Prescriptions:  .  albuterol (PROVENTIL HFA;VENTOLIN HFA) 108 (90 Base) MCG/ACT inhaler, Inhale 2 puffs into the lungs every 6 (six) hours as needed for wheezing or shortness of breath., Disp: 1 Inhaler, Rfl: 6 .  clidinium-chlordiazePOXIDE (LIBRAX) 5-2.5 MG capsule, TAKE ONE CAPSULE BY MOUTH 3 TIMES A DAY AS NEEDED, Disp: 90 capsule, Rfl: 5 .  clopidogrel (PLAVIX) 75 MG tablet, Take 1 tablet (75 mg total) by mouth daily., Disp: 30 tablet, Rfl: 11 .  COMBIVENT RESPIMAT 20-100 MCG/ACT AERS respimat, INHALE 1 PUFF INTO THE LUNGS 2 (TWO) TIMES DAILY., Disp: 1 Inhaler, Rfl: 5 .  hyoscyamine (LEVSIN SL) 0.125 MG SL tablet, Take 1 tablet (0.125 mg total) by mouth every 4 (four) hours as needed for  cramping., Disp: 120 tablet, Rfl: 11 .  Investigational vitamin D 600 UNITS capsule SWOG S0812, Take 600 Units by mouth 2 (two) times daily. Reported on 10/06/2015, Disp: , Rfl:  .  methylphenidate (RITALIN) 10 MG tablet, Take one pill po tid, Disp: 90 tablet, Rfl: 0 .  pantoprazole (PROTONIX) 40 MG tablet, Take 1 tablet (40 mg total) by mouth 2 (two) times daily., Disp:  60 tablet, Rfl: 11 .  predniSONE (DELTASONE) 10 MG tablet, Take 1 tablet (10 mg total) by mouth daily with breakfast., Disp: 30 tablet, Rfl: 5 .  Spacer/Aero-Holding Chambers (AEROCHAMBER MV) inhaler, Use as instructed, Disp: 1 each, Rfl: 0 .  buPROPion (WELLBUTRIN XL) 150 MG 24 hr tablet, Take 1 tablet (150 mg total) by mouth daily., Disp: 30 tablet, Rfl: 11 .  Tiotropium Bromide-Olodaterol (STIOLTO RESPIMAT) 2.5-2.5 MCG/ACT AERS, Inhale 2 puffs into the lungs daily. (Patient not taking: Reported on 02/05/2016), Disp: 2 Inhaler, Rfl: 0  PAST MEDICAL HISTORY: Past Medical History:  Diagnosis Date  . Allergic rhinitis, cause unspecified    failed allergy vaccine  . Allergy   . Anxiety    panic attacks  . Arthritis   . COPD (chronic obstructive pulmonary disease) ([place])   . Depression   . Diverticulosis   . Extrinsic asthma, unspecified   . GERD (gastroesophageal reflux disease)   . Hiatal hernia   . Polyp of nasal cavity   . PONV (postoperative nausea and vomiting)   . S/P dilatation of esophageal stricture 08-2008  . 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 Unspecified sinusitis (chronic)   . Vision abnormalities     PAST SURGICAL HISTORY: Past Surgical History:  Procedure Laterality Date  . BUNIONECTOMY     left foot  . CHOLECYSTECTOMY  05/05/2012   Procedure: LAPAROSCOPIC CHOLECYSTECTOMY WITH INTRAOPERATIVE CHOLANGIOGRAM;  Surgeon: Gayland Curry, MD,FACS;  Location: [facility];  Service: General;  Laterality: N/A;  . COLONOSCOPY    . COLONOSCOPY W/ BIOPSIES AND POLYPECTOMY    . NASAL POLYP SURGERY     x 3   . POLYPECTOMY    . THUMB FUSION  10/13   rt hand  pinned but pin removed  . TONSILLECTOMY      FAMILY HISTORY: Family History  Problem Relation Age of Onset  . Colon cancer Maternal Grandfather     unsure age of onset   . Cancer Maternal Grandfather     colon  . Stroke Maternal Aunt   . Heart disease Father     Vague history  . Prostate cancer Brother   .  Ataxia Daughter     died at age 51.5    SOCIAL HISTORY:  Social History   Social History  . Marital status: Married    Spouse name: N/A  . Number of children: 2  . Years of education: N/A   Occupational History  . Theatre manager   .  [employer]   Social History Main Topics  . Smoking status: Former Smoker    Packs/day: 0.50    Years: 0.00    Types: Cigarettes    Quit date: 06/14/1988  . Smokeless tobacco: Never Used  . Alcohol use No  . Drug use: No  . Sexual activity: Yes    Birth control/ protection: Post-menopausal   Other Topics Concern  . Not on file   Social History Narrative   Lives with husband.       PHYSICAL EXAM  Vitals:   02/05/16 1432  BP:  136/90  Pulse: 68  Resp: 16  Weight: 169 lb 8 oz (76.9 kg)  Height: 5\' 9"  (1.753 m)    Body mass index is 25.03 kg/m.   General: The patient is well-developed and well-nourished and in no acute distress   Neurologic Exam  Mental status: The patient is alert and oriented x 3 at the time of the examination. The patient has reduced recent (3/3 without prompt) and good remote memory, with a mildly reduced  attention span and concentration ability [id number];   WORLD-DLROW).     Speech is normal.     Cranial nerves: Extraocular movements are full.   Facial symmetry is present. There is good facial sensation to soft touch bilaterally.Facial strength is normal.  Trapezius and sternocleidomastoid strength is normal. No dysarthria is noted.  The tongue is midline, and the patient has symmetric elevation of the soft palate.    Motor:  Muscle bulk is normal.   Tone is normal. Strength is  5 / 5 in all 4 extremities.   Sensory: Sensory testing is intact to touch,.   Coordination: Cerebellar testing reveals good finger-nose-finger and heel-to-shin bilaterally.  Gait and station: Station is normal.   Gait is normal. Tandem gait is minimally wide.   Marland Kitchen   DTRs are normal and symmetric.   Marland Kitchen         DIAGNOSTIC DATA (LABS, IMAGING, TESTING) - I reviewed patient records, labs, notes, testing and imaging myself where available.  Lab Results  Component Value Date   WBC 7.3 09/01/2012   HGB 12.0 09/01/2012   HCT 36.8 09/01/2012   MCV 81.2 09/01/2012   PLT 293 09/01/2012      Component Value Date/Time   NA 143 10/02/2014 1548   K 4.2 10/02/2014 1548   CL 102 10/02/2014 1548   CO2 25 10/02/2014 1548   GLUCOSE 81 10/02/2014 1548   GLUCOSE 94 09/01/2012 1542   BUN 14 10/02/2014 1548   CREATININE 1.01 (H) 10/02/2014 1548   CALCIUM 9.3 10/02/2014 1548   PROT 6.6 10/02/2014 1548   ALBUMIN 4.3 10/02/2014 1548   AST 13 10/02/2014 1548   ALT 9 10/02/2014 1548   ALKPHOS 87 10/02/2014 1548   BILITOT <0.2 10/02/2014 1548   GFRNONAA 59 (L) 10/02/2014 1548   GFRAA 68 10/02/2014 1548   Lab Results  Component Value Date   CHOL 228 (H) 09/01/2012   HDL 60.00 09/01/2012   LDLDIRECT 146.4 09/01/2012   TRIG 167.0 (H) 09/01/2012   CHOLHDL 4 09/01/2012   Lab Results  Component Value Date   HGBA1C 5.7 09/01/2012   Lab Results  Component Value Date   VITAMINB12 507 10/02/2014   Lab Results  Component Value Date   TSH 1.210 10/02/2014       ASSESSMENT AND PLAN  Memory difficulties  Attention deficit  Depression with anxiety    1.   Wellbutrin for depression.  2.   Continue Ritalin but increase dose to 10 mg by mouth 3 times a day.  3.    Continue Clopidogrel 75 mg po daily.  RTC prn new or worsening neurologic symptoms.   Caine Barfield A. Felecia Shelling, MD, PhD [id number], 0000000 PM Certified in Neurology, Clinical Neurophysiology, Sleep Medicine, Pain Medicine and Neuroimaging  [facility] [address] [place], [place] [address] [phone]

## 2016-02-06 ENCOUNTER — Telehealth: Payer: Self-pay | Admitting: Internal Medicine

## 2016-02-06 NOTE — Telephone Encounter (Signed)
Signed and on [employer]

## 2016-02-06 NOTE — Telephone Encounter (Signed)
Pt assistance forms received today and are up front for CY to complete.  Will forward to Katie to follow up on.

## 2016-02-09 ENCOUNTER — Telehealth: Payer: Self-pay | Admitting: Neurology

## 2016-02-09 MED ORDER — FLUOXETINE HCL 10 MG PO TABS: 10.0000 mg | ORAL_TABLET | Freq: Every day | ORAL | 3 refills | Status: DC

## 2016-02-09 NOTE — Telephone Encounter (Signed)
Patient returned Faith's call °

## 2016-02-09 NOTE — Addendum Note (Signed)
Addended by: France Ravens I on: 02/09/2016 05:01 PM   Modules accepted: Orders

## 2016-02-09 NOTE — Telephone Encounter (Signed)
I have spoken with [name] [name] again this afternoon and per RAS, offered Fluoxetine 10mg  po qd.  She is agreeble.  [facility] [place] per her request/fim

## 2016-02-09 NOTE — Telephone Encounter (Signed)
Pt states buPROPion (WELLBUTRIN XL) 150 MG 24 hr tablet is making her feel nervous. She started the medication on Saturday. Please call and advise [phone] home for the day

## 2016-02-09 NOTE — Telephone Encounter (Signed)
Katie have you sent the pt assistance forms back in yet?

## 2016-02-09 NOTE — Telephone Encounter (Signed)
Lets try fluoxetine 10 mg daily

## 2016-02-09 NOTE — Telephone Encounter (Signed)
I have spoken with [name] [name] this afternoon.  She sts. she took Wellbutrin for 2 days--it made her feel nervous, so she stopped it yesterday.  Sts. she is not able to tolerate it.  Will check with RAS for other options./fim

## 2016-02-09 NOTE — Telephone Encounter (Signed)
Phone rang without being answered, no ans. machine/fim

## 2016-02-11 NOTE — Telephone Encounter (Signed)
Forms were faxed back and sent to scan in EPIC. Thanks.

## 2016-02-13 ENCOUNTER — Other Ambulatory Visit: Payer: Self-pay | Admitting: Internal Medicine

## 2016-03-03 ENCOUNTER — Telehealth: Payer: Self-pay | Admitting: Internal Medicine

## 2016-03-03 ENCOUNTER — Other Ambulatory Visit: Payer: Self-pay | Admitting: Internal Medicine

## 2016-03-03 NOTE — Telephone Encounter (Signed)
Ok 6 month refills

## 2016-03-03 NOTE — Telephone Encounter (Signed)
Pt requesting refill on Librax 5/2.5mg . Last refill 01/09/16 #90  CY please advise if ok to refill.  Thanks!

## 2016-03-04 NOTE — Telephone Encounter (Signed)
Spoke with pt, states she received a letter back from her patient assistance stating she needs something signed as well as a rx faxed in.  Pt states she will bring forms by the office this afternoon so we can fill out what is needed.    Will await forms.

## 2016-03-05 NOTE — Telephone Encounter (Signed)
Forms received from [place]. I will review forms and complete accordingly. Thanks.

## 2016-03-05 NOTE — Telephone Encounter (Signed)
Pt dropped off assistance forms

## 2016-03-08 ENCOUNTER — Telehealth: Payer: Self-pay | Admitting: Internal Medicine

## 2016-03-08 MED ORDER — TIOTROPIUM BROMIDE-OLODATEROL 2.5-2.5 MCG/ACT IN AERS: 2.0000 | INHALATION_SPRAY | Freq: Every day | RESPIRATORY_TRACT | 3 refills | Status: DC

## 2016-03-08 NOTE — Telephone Encounter (Signed)
lmtcb x1 for pt. 

## 2016-03-08 NOTE — Telephone Encounter (Signed)
Rx for inhaler printed, signed by CY, and sent back to [facility]. Nothing more needed at this time.

## 2016-03-09 NOTE — Telephone Encounter (Signed)
See prior phone note. This was done yesterday by Joellen Jersey. Called made pt aware. She reports she is out of stiolto and requesting samples. Please advise Dr. Annamaria Boots thanks

## 2016-03-09 NOTE — Telephone Encounter (Signed)
Pk sample x 2 Stiolto if available    Inhale 2 puffs, once daily

## 2016-03-09 NOTE — Telephone Encounter (Signed)
Samples have been placed at the front of the office LVM for pt to return call.

## 2016-03-11 NOTE — Telephone Encounter (Signed)
Called spoke with pt. Informed her that samples are ready for pick up. She voiced understanding and had no further questions.

## 2016-03-17 ENCOUNTER — Telehealth: Payer: Self-pay | Admitting: *Deleted

## 2016-03-17 MED ORDER — FLUOXETINE HCL 10 MG PO TABS: 10.0000 mg | ORAL_TABLET | Freq: Every day | ORAL | 1 refills | Status: DC

## 2016-03-17 NOTE — Telephone Encounter (Signed)
90 day rx. for [facility]zac [facility] faxed request/fim

## 2016-03-29 ENCOUNTER — Telehealth: Payer: Self-pay | Admitting: Internal Medicine

## 2016-03-29 ENCOUNTER — Telehealth: Payer: Self-pay | Admitting: Neurology

## 2016-03-29 MED ORDER — METHYLPHENIDATE HCL 10 MG PO TABS: ORAL_TABLET | ORAL | 0 refills | Status: DC

## 2016-03-29 NOTE — Telephone Encounter (Signed)
Spoke with pt. States that Stiolto is causing a dry and irritated tongue. She is rinsing her mouth out after every use. Denies white spots on her tongue, sore throat or difficulty swallowing. Would like CY's recommendations.  Allergies  Allergen Reactions  . Aspirin Shortness Of Breath   Current Outpatient Prescriptions on File Prior to Visit  Medication Sig Dispense Refill  . albuterol (PROVENTIL HFA;VENTOLIN HFA) 108 (90 Base) MCG/ACT inhaler Inhale 2 puffs into the lungs every 6 (six) hours as needed for wheezing or shortness of breath. 1 Inhaler 6  . clidinium-chlordiazePOXIDE (LIBRAX) 5-2.5 MG capsule TAKE 1 CAPSULE 3 TIMES DAILY AS NEEDED 90 capsule 5  . clopidogrel (PLAVIX) 75 MG tablet Take 1 tablet (75 mg total) by mouth daily. 30 tablet 11  . COMBIVENT RESPIMAT 20-100 MCG/ACT AERS respimat INHALE 1 PUFF INTO THE LUNGS 2 (TWO) TIMES DAILY. 1 Inhaler 5  . FLUoxetine (PROZAC) 10 MG tablet Take 1 tablet (10 mg total) by mouth daily. 90 tablet 1  . hyoscyamine (LEVSIN SL) 0.125 MG SL tablet Take 1 tablet (0.125 mg total) by mouth every 4 (four) hours as needed for cramping. 120 tablet 11  . Investigational vitamin D 600 UNITS capsule SWOG S0812 Take 600 Units by mouth 2 (two) times daily. Reported on 10/06/2015    . methylphenidate (RITALIN) 10 MG tablet Take one pill po tid 90 tablet 0  . pantoprazole (PROTONIX) 40 MG tablet Take 1 tablet (40 mg total) by mouth 2 (two) times daily. 60 tablet 11  . predniSONE (DELTASONE) 10 MG tablet Take 1 tablet (10 mg total) by mouth daily with breakfast. 30 tablet 5  . Spacer/Aero-Holding Chambers (AEROCHAMBER MV) inhaler Use as instructed 1 each 0  . Tiotropium Bromide-Olodaterol (STIOLTO RESPIMAT) 2.5-2.5 MCG/ACT AERS Inhale 2 puffs into the lungs daily. 3 Inhaler 3   No current facility-administered medications on file prior to visit.     CY - please advise. Thanks.

## 2016-03-29 NOTE — Telephone Encounter (Signed)
Pt called back and she is aware of CY recs.  She will stop the stiolto and go back to the dulera.  She has this through pt assitance and she will call and get them to refill this for her.

## 2016-03-29 NOTE — Telephone Encounter (Signed)
Patient notified of Dr. Janee Morn recommendations.   Patient states that she has already been using Biotene Mouth Rinse and it is not helping.  Dr. Annamaria Boots, please advise.

## 2016-03-29 NOTE — Telephone Encounter (Signed)
Ritalin rx. up front [facility] 

## 2016-03-29 NOTE — Telephone Encounter (Signed)
Pt returned phone call.  

## 2016-03-29 NOTE — Telephone Encounter (Signed)
Rx. awaiting RAS sig/fim 

## 2016-03-29 NOTE — Telephone Encounter (Signed)
Try using the Stiolto just 1 puff instead of 2, and only when she needs it.

## 2016-03-29 NOTE — Telephone Encounter (Signed)
Alianza 100, # 1,   Inhale 2 puffs then rinse mouth, twice daily

## 2016-03-29 NOTE — Telephone Encounter (Signed)
Attempted to contact patient, left message for patient to return call.

## 2016-03-29 NOTE — Addendum Note (Signed)
Addended by: France Ravens I on: 03/29/2016 02:45 PM   Modules accepted: Orders

## 2016-03-29 NOTE — Telephone Encounter (Signed)
Patient requesting refill of methylphenidate (RITALIN) 10 MG tablet. I advised the Rx will be ready in 24 hours unless the nurse advises otherwise.

## 2016-03-29 NOTE — Telephone Encounter (Signed)
Patient notified of Dr. Janee Morn recommendations.  Patient wants to know if she can just go back to using the [facility]. Patient has patient assistance for the [facility] but she could not get assistance for the Stiolto.   She said that the [facility] had the same reaction as the Stiolto, but she can get it for free with patient assistance.  Dr. Annamaria Boots, please advise.

## 2016-03-29 NOTE — Telephone Encounter (Signed)
Stiolto and similar meds can cause dryness. If it is helping her breathing, then suggest Biotene mouth rinse after use, or as needed.

## 2016-03-29 NOTE — Telephone Encounter (Signed)
lmtcb x1 for pt. 

## 2016-03-30 ENCOUNTER — Other Ambulatory Visit: Payer: Self-pay

## 2016-03-30 MED ORDER — PANTOPRAZOLE SODIUM 40 MG PO TBEC: 40.0000 mg | DELAYED_RELEASE_TABLET | Freq: Two times a day (BID) | ORAL | 0 refills | Status: DC

## 2016-04-21 ENCOUNTER — Other Ambulatory Visit: Payer: Self-pay | Admitting: Neurology

## 2016-05-19 ENCOUNTER — Telehealth: Payer: Self-pay | Admitting: Neurology

## 2016-05-19 MED ORDER — METHYLPHENIDATE HCL 10 MG PO TABS: ORAL_TABLET | ORAL | 0 refills | Status: DC

## 2016-05-19 NOTE — Telephone Encounter (Signed)
Patient requesting refill of  methylphenidate (RITALIN) 10 MG tablet. I advised the Rx will be ready in 24 hours unless the nurse advises otherwise.

## 2016-05-19 NOTE — Telephone Encounter (Signed)
Ritalin rx. up front [facility] 

## 2016-05-19 NOTE — Addendum Note (Signed)
Addended by: France Ravens I on: 05/19/2016 04:05 PM   Modules accepted: Orders

## 2016-05-21 ENCOUNTER — Other Ambulatory Visit: Payer: Self-pay | Admitting: Internal Medicine

## 2016-06-01 ENCOUNTER — Other Ambulatory Visit: Payer: Self-pay | Admitting: Neurology

## 2016-06-05 ENCOUNTER — Telehealth: Payer: Self-pay | Admitting: Neurology

## 2016-06-05 NOTE — Telephone Encounter (Signed)
Patient called after hours call line stating she had lost bottle with Ritalin. When I called her, she stated, she had found the bottle, no other concerns.

## 2016-06-16 ENCOUNTER — Telehealth: Payer: Self-pay | Admitting: Internal Medicine

## 2016-06-16 NOTE — Telephone Encounter (Signed)
This kind of viral infection won't respond to antibiotics now. Stay well hydrated and try an otc symptom therapy like Tylenol Cold and Flu or something similar.

## 2016-06-16 NOTE — Telephone Encounter (Signed)
Spoke with pt. States that she is not feeling well. Reports sore throat and headache. Denies coughing, SOB, chest tightness, wheezing. States that she has been taking Mucinex and Chloraseptic spray with minimal relief. Wants to know what CY recommends her to take for these symptoms. CY - please advise. Thanks.  Allergies  Allergen Reactions  . Aspirin Shortness Of Breath   Current Outpatient Prescriptions on File Prior to Visit  Medication Sig Dispense Refill  . albuterol (PROVENTIL HFA;VENTOLIN HFA) 108 (90 Base) MCG/ACT inhaler Inhale 2 puffs into the lungs every 6 (six) hours as needed for wheezing or shortness of breath. 1 Inhaler 6  . clidinium-chlordiazePOXIDE (LIBRAX) 5-2.5 MG capsule TAKE 1 CAPSULE 3 TIMES DAILY AS NEEDED 90 capsule 5  . clopidogrel (PLAVIX) 75 MG tablet TAKE 1 TABLET (75 MG TOTAL) BY MOUTH DAILY. 30 tablet 0  . COMBIVENT RESPIMAT 20-100 MCG/ACT AERS respimat INHALE 1 PUFF INTO THE LUNGS 2 (TWO) TIMES DAILY. 1 Inhaler 5  . FLUoxetine (PROZAC) 10 MG tablet Take 1 tablet (10 mg total) by mouth daily. 90 tablet 1  . hyoscyamine (LEVSIN SL) 0.125 MG SL tablet Take 1 tablet (0.125 mg total) by mouth every 4 (four) hours as needed for cramping. 120 tablet 11  . Investigational vitamin D 600 UNITS capsule SWOG S0812 Take 600 Units by mouth 2 (two) times daily. Reported on 10/06/2015    . methylphenidate (RITALIN) 10 MG tablet Take one pill po tid 90 tablet 0  . mometasone-formoterol (DULERA) 100-5 MCG/ACT AERO Inhale 2 puffs into the lungs 2 (two) times daily.    . pantoprazole (PROTONIX) 40 MG tablet Take 1 tablet (40 mg total) by mouth 2 (two) times daily. 180 tablet 0  . predniSONE (DELTASONE) 10 MG tablet TAKE 1 TABLET (10 MG TOTAL) BY MOUTH DAILY WITH BREAKFAST. 30 tablet 5  . Spacer/Aero-Holding Chambers (AEROCHAMBER MV) inhaler Use as instructed 1 each 0   No current facility-administered medications on file prior to visit.

## 2016-06-16 NOTE — Telephone Encounter (Signed)
Spoke with pt. She is aware of CY's recommendations. Nothing further was needed. 

## 2016-06-17 ENCOUNTER — Telehealth: Payer: Self-pay | Admitting: Internal Medicine

## 2016-06-17 NOTE — Telephone Encounter (Signed)
Pt states she went to urgent care last night after calling our office with her symptoms, and being told it sounded viral . Pt states urgent care stated her lungs were clear and O2 levels were good. Urgent care agreed that it sounded viral, and did not prescribe any medication. Pt states she noticed today with any exertion, she gets winded. Pt states CY previously suggested that she walk and exercise to expand her lungs. Pt is concerned about the sob with exertion and would like recommendations. CY please advise. Thanks.   Current Outpatient Prescriptions on File Prior to Visit  Medication Sig Dispense Refill  . albuterol (PROVENTIL HFA;VENTOLIN HFA) 108 (90 Base) MCG/ACT inhaler Inhale 2 puffs into the lungs every 6 (six) hours as needed for wheezing or shortness of breath. 1 Inhaler 6  . clidinium-chlordiazePOXIDE (LIBRAX) 5-2.5 MG capsule TAKE 1 CAPSULE 3 TIMES DAILY AS NEEDED 90 capsule 5  . clopidogrel (PLAVIX) 75 MG tablet TAKE 1 TABLET (75 MG TOTAL) BY MOUTH DAILY. 30 tablet 0  . COMBIVENT RESPIMAT 20-100 MCG/ACT AERS respimat INHALE 1 PUFF INTO THE LUNGS 2 (TWO) TIMES DAILY. 1 Inhaler 5  . FLUoxetine (PROZAC) 10 MG tablet Take 1 tablet (10 mg total) by mouth daily. 90 tablet 1  . hyoscyamine (LEVSIN SL) 0.125 MG SL tablet Take 1 tablet (0.125 mg total) by mouth every 4 (four) hours as needed for cramping. 120 tablet 11  . Investigational vitamin D 600 UNITS capsule SWOG S0812 Take 600 Units by mouth 2 (two) times daily. Reported on 10/06/2015    . methylphenidate (RITALIN) 10 MG tablet Take one pill po tid 90 tablet 0  . mometasone-formoterol (DULERA) 100-5 MCG/ACT AERO Inhale 2 puffs into the lungs 2 (two) times daily.    . pantoprazole (PROTONIX) 40 MG tablet Take 1 tablet (40 mg total) by mouth 2 (two) times daily. 180 tablet 0  . predniSONE (DELTASONE) 10 MG tablet TAKE 1 TABLET (10 MG TOTAL) BY MOUTH DAILY WITH BREAKFAST. 30 tablet 5  . Spacer/Aero-Holding Chambers (AEROCHAMBER MV)  inhaler Use as instructed 1 each 0   No current facility-administered medications on file prior to visit.     Allergies  Allergen Reactions  . Aspirin Shortness Of Breath

## 2016-06-17 NOTE — Telephone Encounter (Signed)
Patient is returning phone call.  °

## 2016-06-17 NOTE — Telephone Encounter (Signed)
If she is fighting a virus infection she will feel weaker and more easily short of breath with exertion, until she gets better. Don't mean for her to be walking outdoors in this cold weather- better to wait and take it easy next few days. Ok to use her breathing medicines as directed- either albuterol inhaler or Combivent (both are on her med list) but not both at same time.

## 2016-06-17 NOTE — Telephone Encounter (Signed)
lmtcb X1 

## 2016-06-17 NOTE — Telephone Encounter (Signed)
Spoke with pt. She is aware of CY's recommendations. Nothing further was needed. 

## 2016-06-25 ENCOUNTER — Telehealth: Payer: Self-pay | Admitting: Internal Medicine

## 2016-06-25 NOTE — Telephone Encounter (Signed)
Spoke with pt,aware of recs.  Nothing further needed.  

## 2016-06-25 NOTE — Telephone Encounter (Signed)
This is likely viral and antibiotic isn't going to help at this stage. Encourage fluids, stay out of the weather. Ok to use an otc symptom remedy like Tylenol Cold and Flu, Zicam, etc.Ok to check with [place] again after the weekend if not doing better, but this will take awhile to go away.

## 2016-06-25 NOTE — Telephone Encounter (Signed)
Spoke with pt. States that she is not feeling well. Reports increased coughing. Cough is producing yellow mucus. Denies chest tightness, wheezing, SOB or fever. Went to urgent care on Sunday and was given a steroid injection. Doesn't feel that the injection has made much of a difference. Pt would like CY's recommendation. CY - please advise. Thanks.  Allergies  Allergen Reactions  . Aspirin Shortness Of Breath   Current Outpatient Prescriptions on File Prior to Visit  Medication Sig Dispense Refill  . albuterol (PROVENTIL HFA;VENTOLIN HFA) 108 (90 Base) MCG/ACT inhaler Inhale 2 puffs into the lungs every 6 (six) hours as needed for wheezing or shortness of breath. 1 Inhaler 6  . clidinium-chlordiazePOXIDE (LIBRAX) 5-2.5 MG capsule TAKE 1 CAPSULE 3 TIMES DAILY AS NEEDED 90 capsule 5  . clopidogrel (PLAVIX) 75 MG tablet TAKE 1 TABLET (75 MG TOTAL) BY MOUTH DAILY. 30 tablet 0  . COMBIVENT RESPIMAT 20-100 MCG/ACT AERS respimat INHALE 1 PUFF INTO THE LUNGS 2 (TWO) TIMES DAILY. 1 Inhaler 5  . FLUoxetine (PROZAC) 10 MG tablet Take 1 tablet (10 mg total) by mouth daily. 90 tablet 1  . hyoscyamine (LEVSIN SL) 0.125 MG SL tablet Take 1 tablet (0.125 mg total) by mouth every 4 (four) hours as needed for cramping. 120 tablet 11  . Investigational vitamin D 600 UNITS capsule SWOG S0812 Take 600 Units by mouth 2 (two) times daily. Reported on 10/06/2015    . methylphenidate (RITALIN) 10 MG tablet Take one pill po tid 90 tablet 0  . mometasone-formoterol (DULERA) 100-5 MCG/ACT AERO Inhale 2 puffs into the lungs 2 (two) times daily.    . pantoprazole (PROTONIX) 40 MG tablet Take 1 tablet (40 mg total) by mouth 2 (two) times daily. 180 tablet 0  . predniSONE (DELTASONE) 10 MG tablet TAKE 1 TABLET (10 MG TOTAL) BY MOUTH DAILY WITH BREAKFAST. 30 tablet 5  . Spacer/Aero-Holding Chambers (AEROCHAMBER MV) inhaler Use as instructed 1 each 0   No current facility-administered medications on file prior to visit.

## 2016-07-09 ENCOUNTER — Other Ambulatory Visit: Payer: Self-pay | Admitting: Neurology

## 2016-07-12 ENCOUNTER — Telehealth: Payer: Self-pay | Admitting: Neurology

## 2016-07-12 MED ORDER — METHYLPHENIDATE HCL 10 MG PO TABS: ORAL_TABLET | ORAL | 0 refills | Status: DC

## 2016-07-12 NOTE — Telephone Encounter (Signed)
Patient requesting refill of methylphenidate (RITALIN) 10 MG tablet. ° ° °

## 2016-07-12 NOTE — Telephone Encounter (Signed)
Rx. up front [facility]/fim 

## 2016-07-12 NOTE — Addendum Note (Signed)
Addended by: France Ravens I on: 07/12/2016 01:11 PM   Modules accepted: Orders

## 2016-07-12 NOTE — Telephone Encounter (Signed)
Rx. awaiting RAS sig/fim 

## 2016-07-19 ENCOUNTER — Telehealth: Payer: Self-pay | Admitting: Neurology

## 2016-07-19 NOTE — Telephone Encounter (Signed)
LMTC (identified vm)/fim 

## 2016-07-19 NOTE — Telephone Encounter (Signed)
Patient is returning your call.  

## 2016-07-19 NOTE — Telephone Encounter (Signed)
Patient calling to discuss blood thinner she is on but does not know the name. She is bruising easily.

## 2016-07-20 NOTE — Telephone Encounter (Signed)
I have spoken with [name] [name] this morning.  She sts. about a wk. ago she had pain/bruising under one arm which is resolving.  No further episodes of bleeding/bruising.  She was seen and treated at Urgent Care and was dx. with muscular injury.(unknown specific injury).    She wonders if this is related to her taking Plavix.  I have explained that it is likely not related to the Plavix, but she should let us know if she has further episodes that concern her.  She verbalized understanding of same/fim

## 2016-08-10 ENCOUNTER — Telehealth: Payer: Self-pay | Admitting: Gastroenterology

## 2016-08-10 ENCOUNTER — Other Ambulatory Visit: Payer: Self-pay | Admitting: Neurology

## 2016-08-10 NOTE — Telephone Encounter (Signed)
Patient reports that for the last few weeks she is having worsening reflux and heartburn.  She is requiring Galviscon several times a day with no relief.  She is taking protonix 40 mg BID. We reviewed an antireflux diet and measures.  Please advise additional recommendations

## 2016-08-10 NOTE — Telephone Encounter (Signed)
Strictly follow all antireflux measures Protonix 40 mg po bid ac Ranitidine 300 mg hs

## 2016-08-10 NOTE — Telephone Encounter (Signed)
Left message for patient to call back  

## 2016-08-11 MED ORDER — PANTOPRAZOLE SODIUM 40 MG PO TBEC: 40.0000 mg | DELAYED_RELEASE_TABLET | Freq: Two times a day (BID) | ORAL | 3 refills | Status: DC

## 2016-08-11 MED ORDER — RANITIDINE HCL 300 MG PO CAPS: 300.0000 mg | ORAL_CAPSULE | Freq: Every day | ORAL | 3 refills | Status: DC

## 2016-08-11 NOTE — Telephone Encounter (Signed)
New rx sent Left message for patient to call back

## 2016-08-11 NOTE — Telephone Encounter (Signed)
Patient notified of recommendations. 

## 2016-08-26 ENCOUNTER — Telehealth: Payer: Self-pay | Admitting: Gastroenterology

## 2016-08-26 MED ORDER — ESOMEPRAZOLE MAGNESIUM 40 MG PO PACK: 40.0000 mg | PACK | Freq: Two times a day (BID) | ORAL | 3 refills | Status: DC

## 2016-08-26 NOTE — Telephone Encounter (Signed)
Left message for patient to call back  

## 2016-08-26 NOTE — Telephone Encounter (Signed)
Previously she has had multifactorial chest pain so some of her symptoms could be non GI.  Closely follow all antireflux measures, use 4" be blocks long term, trial of Nexium 40 mg po bid.  If she is having nocturnal symptoms add ranitidine 300 mg hs.

## 2016-08-26 NOTE — Telephone Encounter (Signed)
Patient reports that antireflux measures and bid pantoprazole is not helping her reflux.  Please advise

## 2016-08-26 NOTE — Telephone Encounter (Signed)
Patient notified of recommendations and that Nexium will replace the pantoprazole.  She is taking ranitidine at HS.

## 2016-08-27 ENCOUNTER — Telehealth: Payer: Self-pay | Admitting: Gastroenterology

## 2016-08-27 MED ORDER — ESOMEPRAZOLE MAGNESIUM 40 MG PO CPDR: 40.0000 mg | DELAYED_RELEASE_CAPSULE | Freq: Two times a day (BID) | ORAL | 3 refills | Status: DC

## 2016-08-27 NOTE — Telephone Encounter (Signed)
Spoke with pt and she is aware. States she will try the nexium. New script sent to pharmacy as packets were prescribed previously.

## 2016-08-27 NOTE — Telephone Encounter (Signed)
Nexium and all other PPIs except Protonix have the risk of a potential interaction with Plavix that might decrease the effectiveness Plavix however it may not have any impact on Plavix. Pt choice if she wants to stay on Protonix bid or change to Nexium bid.

## 2016-08-27 NOTE — Telephone Encounter (Signed)
See previous note, pt is aware of interaction and chooses to take Nexium. Notified Butch Penny at [facility].

## 2016-08-27 NOTE — Telephone Encounter (Signed)
Butch Penny calling back from [facility] on [address] states Nexium interacts with other Plavix. Butch Penny is requesting a call back. [phone].

## 2016-08-27 NOTE — Telephone Encounter (Signed)
Pt states she was prescribed nexium yesterday to take the place of protonix. When she went to pick it up the pharmacy told her she should not take this because of the plavix that she takes. Pt is calling for another medication for reflux. Please advise.

## 2016-08-30 NOTE — Telephone Encounter (Signed)
Patient reports that she is having diarrhea since starting on Nexium.  She will try and hold it for a few days and see if the diarrhea resolves.  If it does she will call back for an alternate PPI

## 2016-08-30 NOTE — Telephone Encounter (Signed)
Pt states that since she has starting taking the nexium she is having diarrhea after she eats and wants to know if this is a side affect of the medication

## 2016-08-31 ENCOUNTER — Telehealth: Payer: Self-pay | Admitting: Gastroenterology

## 2016-08-31 NOTE — Telephone Encounter (Signed)
Patient is on Nexium and Plavix is this ok?

## 2016-08-31 NOTE — Telephone Encounter (Signed)
We addressed this concern-see 3/16 note.

## 2016-08-31 NOTE — Telephone Encounter (Signed)
Patient notified

## 2016-09-01 ENCOUNTER — Telehealth: Payer: Self-pay | Admitting: Neurology

## 2016-09-01 ENCOUNTER — Telehealth: Payer: Self-pay | Admitting: Gastroenterology

## 2016-09-01 MED ORDER — METHYLPHENIDATE HCL 10 MG PO TABS: ORAL_TABLET | ORAL | 0 refills | Status: DC

## 2016-09-01 NOTE — Addendum Note (Signed)
Addended by: France Ravens I on: 09/01/2016 03:57 PM   Modules accepted: Orders

## 2016-09-01 NOTE — Telephone Encounter (Signed)
Rx. awaiting RAS sig/fim 

## 2016-09-01 NOTE — Telephone Encounter (Signed)
Rx. up front [facility]/fim 

## 2016-09-01 NOTE — Telephone Encounter (Signed)
Patient called office requesting refill for methylphenidate (RITALIN) 10 MG tablet

## 2016-09-01 NOTE — Telephone Encounter (Signed)
Patient called office in reference to be prescribed Nexium thru GI provider.  Patient takes clopidogrel (PLAVIX) 75 MG tablet.  Patient states pharmacist advised patient the two medications may not react well together.  Please call

## 2016-09-01 NOTE — Telephone Encounter (Signed)
Left message for patient to call back Daily triage since last Thursday.  Patient needs an office visit for her diarrhea.  She is scheduled for tomorrow at 10:30 with Nicoletta Ba PA

## 2016-09-02 ENCOUNTER — Ambulatory Visit (INDEPENDENT_AMBULATORY_CARE_PROVIDER_SITE_OTHER): Payer: Managed Care, Other (non HMO) | Admitting: Physician Assistant

## 2016-09-02 ENCOUNTER — Encounter: Payer: Self-pay | Admitting: Physician Assistant

## 2016-09-02 VITALS — BP 110/64 | HR 84 | Ht 69.0 in | Wt 160.5 lb

## 2016-09-02 DIAGNOSIS — K219 Gastro-esophageal reflux disease without esophagitis: Secondary | ICD-10-CM | POA: Diagnosis not present

## 2016-09-02 DIAGNOSIS — K589 Irritable bowel syndrome without diarrhea: Secondary | ICD-10-CM | POA: Diagnosis not present

## 2016-09-02 MED ORDER — SUCRALFATE 1 GM/10ML PO SUSP: 1.0000 g | Freq: Three times a day (TID) | ORAL | 0 refills | Status: DC

## 2016-09-02 MED ORDER — PANTOPRAZOLE SODIUM 40 MG PO TBEC: 40.0000 mg | DELAYED_RELEASE_TABLET | Freq: Two times a day (BID) | ORAL | 6 refills | Status: DC

## 2016-09-02 NOTE — Patient Instructions (Signed)
We have sent the following medications to your pharmacy for you to pick up at your convenience:  Protonix, Carafate  (take the Carafate between meals and at bedtime for 6 weeks)  Continue Librax before meals  Add Align or Culturelle daily

## 2016-09-02 NOTE — Progress Notes (Signed)
Reviewed and agree with initial management plan.  Tomara Youngberg T. Aimee Heldman, MD FACG 

## 2016-09-02 NOTE — Progress Notes (Signed)
Subjective:    Patient ID: [name] [name], female    DOB: 30-Oct-1950, 66 y.o.   MRN: [id number]  HPI  [name] [name] is a pleasant 66 year old African-American female known to Dr. Fuller Plan. She has history of COPD, chronic GERD, osteoporosis and memory difficulties. She is maintained on Plavix . She has history of adenomatous colon polyps, last colonoscopy April 2017 was normal. Last EGD in 2014 with finding of a small hiatal hernia, no stricture. She comes in today with complaints of reflux and some urgency for bowel movements postprandially. She says she has been having urge for bowel movements after meals over the past couple of months despite taking Librax regularly. She had also called because she was having some reflux symptoms refractory to Protonix twice a day. Initially she was told to add Zantac at bedtime which did not find helpful and then was switched to Nexium which did not work well for her. Says she seemed to develop increased heartburn and diarrhea with the Nexium. She has made some changes in her diet and seems overly focused on some things that she eats regularly like popcorn low-fat ice cream and juice. He doesn't understand why she still has heartburn and why she should have to change her diet if she is on medication for reflux. She has no complaints of dysphagia or odynophagia. Over the past couple of days she has had decrease in appetite and a few episodes of diarrhea.   Review of Systems Pertinent positive and negative review of systems were noted in the above HPI section.  All other review of systems was otherwise negative.  Outpatient Encounter Prescriptions as of 09/02/2016  Medication Sig  . albuterol (PROVENTIL HFA;VENTOLIN HFA) 108 (90 Base) MCG/ACT inhaler Inhale 2 puffs into the lungs every 6 (six) hours as needed for wheezing or shortness of breath.  . clidinium-chlordiazePOXIDE (LIBRAX) 5-2.5 MG capsule TAKE 1 CAPSULE 3 TIMES DAILY AS NEEDED  . clopidogrel (PLAVIX) 75 MG  tablet TAKE 1 TABLET (75 MG TOTAL) BY MOUTH DAILY.  Marland Kitchen COMBIVENT RESPIMAT 20-100 MCG/ACT AERS respimat INHALE 1 PUFF INTO THE LUNGS 2 (TWO) TIMES DAILY.  Marland Kitchen esomeprazole (NEXIUM) 40 MG packet Take 40 mg by mouth 2 (two) times daily.  Marland Kitchen FLUoxetine (PROZAC) 10 MG tablet TAKE 1 TABLET (10 MG TOTAL) BY MOUTH DAILY.  . hyoscyamine (LEVSIN SL) 0.125 MG SL tablet Take 1 tablet (0.125 mg total) by mouth every 4 (four) hours as needed for cramping.  . Investigational vitamin D 600 UNITS capsule SWOG [id number] Take 600 Units by mouth 2 (two) times daily. Reported on 10/06/2015  . methylphenidate (RITALIN) 10 MG tablet Take one pill po tid  . mometasone-formoterol (DULERA) 100-5 MCG/ACT AERO Inhale 2 puffs into the lungs 2 (two) times daily.  . pantoprazole (PROTONIX) 40 MG tablet Take 1 tablet (40 mg total) by mouth 2 (two) times daily.  . predniSONE (DELTASONE) 10 MG tablet TAKE 1 TABLET (10 MG TOTAL) BY MOUTH DAILY WITH BREAKFAST.  Marland Kitchen Spacer/Aero-Holding Chambers (AEROCHAMBER MV) inhaler Use as instructed  . sucralfate (CARAFATE) 1 GM/10ML suspension Take 10 mLs (1 g total) by mouth 4 (four) times daily -  with meals and at bedtime.  . [DISCONTINUED] esomeprazole (NEXIUM) 40 MG capsule Take 1 capsule (40 mg total) by mouth 2 (two) times daily before a meal.  . [DISCONTINUED] pantoprazole (PROTONIX) 40 MG tablet Take 1 tablet (40 mg total) by mouth 2 (two) times daily.  . [DISCONTINUED] ranitidine (ZANTAC) 300 MG capsule Take 1 capsule (  300 mg total) by mouth at bedtime.   No facility-administered encounter medications on file as of 09/02/2016.    Allergies  Allergen Reactions  . Aspirin Shortness Of Breath   Patient Active Problem List   Diagnosis Date Noted  . COPD with acute exacerbation ([place]) 06/23/2015  . Low serum vitamin D 04/01/2015  . Attention deficit 04/01/2015  . Memory difficulties 10/02/2014  . Thrush, oral 01/27/2014  . Other and unspecified hyperlipidemia 09/01/2012  . Osteoporosis  09/01/2012  . Depression with anxiety 09/01/2012  . ESOPHAGEAL STRICTURE 04/17/2010  . GERD 05/14/2008  . Moderate COPD (chronic obstructive pulmonary disease) ([place]) 09/19/2007  . Aspirin-sensitive asthma with nasal polyps 04/10/2007  . Allergic rhinitis due to pollen 04/10/2007   Social History   Social History  . Marital status: Married    Spouse name: N/A  . Number of children: 2  . Years of education: N/A   Occupational History  . Theatre manager   .  [employer]   Social History Main Topics  . Smoking status: Former Smoker    Packs/day: 0.50    Years: 0.00    Types: Cigarettes    Quit date: 06/14/1988  . Smokeless tobacco: Never Used  . Alcohol use No  . Drug use: No  . Sexual activity: Yes    Birth control/ protection: Post-menopausal   Other Topics Concern  . Not on file   Social History Narrative   Lives with husband.      Ms. Bloodgood family history includes Ataxia in her daughter; Cancer in her maternal grandfather; Colon cancer in her maternal grandfather; Heart disease in her father; Prostate cancer in her brother; Stroke in her maternal aunt.      Objective:    Vitals:   09/02/16 1035  BP: 110/64  Pulse: 84    Physical Exam  well-developed African American female in no acute distress, blood pressure 110/64, pulse 84, height 5 foot 9, weight 160, BMI 23.7. HEENT ;nontraumatic normocephalic EOMI PERRLA sclera anicteric, Cardiovascular; regular rate and rhythm with S1-S2, Pulmonary ;clear bilaterally, Abdomen; soft nontender nondistended bowel sounds are present there is no palpable mass or hepatosplenomegaly, Rectal; exam not done, Ext;no clubbing cyanosis or edema skin warm dry, Neuropsych ;mood and affect appropriate       Assessment & Plan:   #68 66 year old female with chronic GERD with intermittent breakthrough symptoms. #2 IBS, postprandial urgency #3 history of adenomatous colon polyps, last colonoscopy April 2017 normal #4  COPD steroid dependent #5 memory difficulties  Plan; I do not think her current symptoms are severe. We'll switch back to Protonix 40 mg by mouth twice a day Add course of Carafate slurry 1 g between meals and at bedtime Continue Librax before meals meals Add daily probiotic, suggested align or Culturelle on a daily basis Tightening up in antireflux regimen  Patient will follow-up with Dr. Fuller Plan or myself as needed.  Amy S Esterwood PA-C 09/02/2016   Cc: No ref. provider found

## 2016-09-23 ENCOUNTER — Other Ambulatory Visit: Payer: Self-pay | Admitting: Physician Assistant

## 2016-10-26 DIAGNOSIS — M87051 Idiopathic aseptic necrosis of right femur: Secondary | ICD-10-CM | POA: Insufficient documentation

## 2016-11-04 ENCOUNTER — Other Ambulatory Visit: Payer: Self-pay | Admitting: Internal Medicine

## 2016-11-04 MED ORDER — IPRATROPIUM-ALBUTEROL 20-100 MCG/ACT IN AERS: INHALATION_SPRAY | RESPIRATORY_TRACT | 3 refills | Status: DC

## 2016-11-04 NOTE — Telephone Encounter (Signed)
Per CY-okay to refill this time with 3 additional refills. Sent to pharmacy via [facility].

## 2016-11-06 ENCOUNTER — Other Ambulatory Visit: Payer: Self-pay | Admitting: Internal Medicine

## 2016-11-09 ENCOUNTER — Telehealth: Payer: Self-pay | Admitting: Neurology

## 2016-11-09 MED ORDER — METHYLPHENIDATE HCL 10 MG PO TABS: ORAL_TABLET | ORAL | 0 refills | Status: DC

## 2016-11-09 NOTE — Telephone Encounter (Signed)
Patient requesting refill of methylphenidate (RITALIN) 10 MG tablet.Patient is out of medication.

## 2016-11-09 NOTE — Telephone Encounter (Signed)
Rx. awaiting RAS sig/fim 

## 2016-11-09 NOTE — Telephone Encounter (Signed)
Rx. up front [facility]/fim 

## 2016-11-09 NOTE — Addendum Note (Signed)
Addended by: France Ravens I on: 11/09/2016 11:04 AM   Modules accepted: Orders

## 2016-11-18 ENCOUNTER — Other Ambulatory Visit: Payer: Self-pay | Admitting: Internal Medicine

## 2016-12-10 ENCOUNTER — Other Ambulatory Visit: Payer: Self-pay | Admitting: Gastroenterology

## 2016-12-20 ENCOUNTER — Other Ambulatory Visit: Payer: Self-pay | Admitting: Internal Medicine

## 2017-01-10 ENCOUNTER — Telehealth: Payer: Self-pay | Admitting: Neurology

## 2017-01-10 MED ORDER — METHYLPHENIDATE HCL 10 MG PO TABS: ORAL_TABLET | ORAL | 0 refills | Status: DC

## 2017-01-10 NOTE — Telephone Encounter (Signed)
Rx. up front [facility]/fim 

## 2017-01-10 NOTE — Telephone Encounter (Signed)
Patient called office requesting refill for methylphenidate (RITALIN) 10 MG tablet.

## 2017-01-10 NOTE — Telephone Encounter (Signed)
Rx. awaiting RAS sig/fim 

## 2017-01-10 NOTE — Addendum Note (Signed)
Addended by: France Ravens I on: 01/10/2017 12:54 PM   Modules accepted: Orders

## 2017-01-13 ENCOUNTER — Other Ambulatory Visit: Payer: Self-pay | Admitting: Gastroenterology

## 2017-01-24 ENCOUNTER — Other Ambulatory Visit: Payer: Self-pay | Admitting: Internal Medicine

## 2017-02-21 ENCOUNTER — Other Ambulatory Visit: Payer: Self-pay | Admitting: Internal Medicine

## 2017-02-22 ENCOUNTER — Other Ambulatory Visit: Payer: Self-pay | Admitting: Internal Medicine

## 2017-02-22 ENCOUNTER — Telehealth: Payer: Self-pay | Admitting: Internal Medicine

## 2017-02-22 MED ORDER — PREDNISONE 10 MG PO TABS: 10.0000 mg | ORAL_TABLET | Freq: Every day | ORAL | 0 refills | Status: DC

## 2017-02-22 NOTE — Telephone Encounter (Signed)
Patient has been scheduled for 9/27 at 430. She is aware of appt.

## 2017-02-22 NOTE — Telephone Encounter (Signed)
Yes okay to use. Thanks.

## 2017-02-22 NOTE — Telephone Encounter (Signed)
Spoke with patient. She stated that she was not aware that she needed to schedule an appt. Attempted to schedule pt, CY does not have anything available except for a held spot on Thursday 27th at 430. Pt prefers evening appts.   Since patient attempted to schedule an appt, I went ahead and sent in a month refill for her. She verbalized understanding.   Katie, please advise if we can use that spot for the patient. Thanks!

## 2017-02-23 ENCOUNTER — Other Ambulatory Visit: Payer: Self-pay | Admitting: Gastroenterology

## 2017-03-10 ENCOUNTER — Encounter: Payer: Self-pay | Admitting: Internal Medicine

## 2017-03-10 ENCOUNTER — Ambulatory Visit (INDEPENDENT_AMBULATORY_CARE_PROVIDER_SITE_OTHER): Payer: Managed Care, Other (non HMO) | Admitting: Internal Medicine

## 2017-03-10 DIAGNOSIS — J449 Chronic obstructive pulmonary disease, unspecified: Secondary | ICD-10-CM

## 2017-03-10 DIAGNOSIS — K130 Diseases of lips: Secondary | ICD-10-CM | POA: Diagnosis not present

## 2017-03-10 MED ORDER — PREDNISONE 10 MG PO TABS: 10.0000 mg | ORAL_TABLET | Freq: Every day | ORAL | 6 refills | Status: DC

## 2017-03-10 NOTE — Assessment & Plan Note (Signed)
When last tested in 2014 she had an early small airway obstruction with ratio 0.61. DLCO 71%. There has been a reversible/asthma component but this is mostly mild chronic smoking-related disease. No recent exacerbation. We discussed maintenance prednisone. She understands her adrenal glands are likely dependent. She has reported far fewer acute exacerbations since we began prednisone and we agreed to continue.

## 2017-03-10 NOTE — Patient Instructions (Signed)
Ok to continue current meds  Prednisone refill sent  Don't forget that flu shot  Please call if we can help

## 2017-03-10 NOTE — Progress Notes (Signed)
Subjective:    Patient ID: [name] [name], female    DOB: Nov 24, 1950, 66 y.o.   MRN: [id number]  HPI  female former smoker followed for asthma/COPD, allergic rhinitis/history nasal polyps/aspirin allergy, recurrent sinusitis, complicated by GERD Office Spirometry 06/01/12- moderate obstructive airways disease. FVC 3.15/85%, FEV1 1.87/65%, FEV1/FVC 0.59% FEF 25-75% 0.92/36% PFT 02/01/08- we compared her old PFT-mild obstructive airways disease with minimal response to bronchodilator. FVC 4.14/107%, FEV1 2.83/98%, FEV1/FVC 0.68, FEF 25-75% 1.72/56%. Scores have declined. PFT 07/20/2012-moderate obstructive airways disease with response to bronchodilator, normal lung volumes, diffusion mildly reduced. FVC 3.87/107%, FEV1 2.37/88%, FEV1/FVC 0.61, FEF 25-75% 1.12/39%. TLC 104%, DLCO 71%. 6MWT 07/20/2012-98%, 97%, 98%, 444 m. Noted hip pain. No oxygen limitation. Echocardiogram 08/03/2012-EF 33-29%, gr 1 diastolic dysfunction. No pulmonary hypertension or wall motion abnormality FeNO-12/18/15- 13  -----------------------------------------------------------------------------------------------------------------  12/18/2015-66 year old female former smoker followed for asthma/COPD, allergic rhinitis/history nasal polyps/aspirin allergy, recurrent sinusitis, complicated by GERD LOV 51/88/[id number] COPD likely exacerbated by GERD and anxiety, followed by GI with Zoloft added by neurology. Was given low-dose Xanax. To continue Dulera, Combivent and given albuterol rescue inhaler. Given methylphenidate by neurology. FOLLOWS FOR: Pt states she went back using Dulera with her aerochamber-to help with mouth issues. Pt continues to have issues with sinus-went to ENT yesterday and waiting on culture to come back as she has been on 3 different abx's without help. FeNO-12/18/15- 13   03/10/17- 66 year old female former smoker followed for asthma/COPD, allergic rhinitis/history nasal polyps/aspirin allergy,  recurrent sinusitis, complicated by GERD 1 yr f/u for asthma. States asthma has been ok, but has been having COPD flare ups. Dry mouth.  Albuterol HFA, CombiventRespimat, Dulera 100, prednisone 10 mg daily Bothered by dry mouth and soreness in the corners of her mouth over the last month or 2. Says her breathing is "okay" with no recent exacerbation and only occasional use of rescue inhaler. Breathing does not wake her. Heartburn only if she is careless about what she eats. We discussed her maintenance prednisone. She had wanted this for airway stability not provided by inhaled steroids. She no longer has a frame of reference and isn't sure how much this helps but understands that she is now dependent upon it.  ROS-see HPI   + = positive Constitutional:    weight loss, night sweats, fevers, chills, fatigue, lassitude. HEENT:    headaches, difficulty swallowing, tooth/dental problems, sore throat,       sneezing, itching, ear ache, +nasal congestion, post nasal drip, snoring CV:    chest pain, orthopnea, PND, swelling in lower extremities, anasarca,                                                     dizziness, palpitations Resp:   shortness of breath with exertion or at rest.                productive cough,   non-productive cough, coughing up of blood.              change in color of mucus.  wheezing.   Skin:    rash or lesions. GI:  No-   heartburn, indigestion, abdominal pain, nausea, vomiting,  GU:  MS:   joint pain, stiffness,  Neuro-     nothing unusual Psych:  change in mood or affect.  depression or anxiety.   memory loss.  OBJ- Physical Exam General- Alert, Oriented, Affect-appropriate, Distress- none acute Skin- rash-none, lesions- none, excoriation- none Lymphadenopathy- none Head- atraumatic            Eyes- Gross vision intact, PERRLA, conjunctivae and secretions clear            Ears- Hearing, canals-normal            Nose- not- stuffy, no-Septal dev, mucus, polyps,  erosion, perforation             Throat- Mallampati III , mucosa clear/not dry , drainage- none, tonsils- atrophic, + hoarse                         , + dentures, + mild angular stomatitis Neck- flexible , trachea midline, no stridor , thyroid nl, carotid no bruit Chest - symmetrical excursion , unlabored           Heart/CV- RRR , no murmur , no gallop  , no rub, nl s1 s2                           - JVD- none , edema- none, stasis changes- none, varices- none           Lung- clear to P&A, wheeze- none, cough +, dullness-none, rub- none           Chest wall-  Abd-  Br/ Gen/ Rectal- Not done, not indicated Extrem- cyanosis- none, clubbing, none, atrophy- none, strength- nl Neuro- grossly intact to observation

## 2017-03-10 NOTE — Assessment & Plan Note (Signed)
She doesn't think she's been anemic. Plan-Chapstick, Neosporin or Vaseline at corners of mouth if needed.

## 2017-03-14 ENCOUNTER — Telehealth: Payer: Self-pay | Admitting: Neurology

## 2017-03-14 NOTE — Telephone Encounter (Signed)
Spoke with [name] [name] and explained it has been over one yr. since she was last seen in our office (02/05/16).  RAS must see her before he is able to r/f Ritalin.  She verbalized understanding of same. Appt. given 03/16/17 at 1pm, arrival time of 1230/fim

## 2017-03-14 NOTE — Telephone Encounter (Signed)
Pt request refill for methylphenidate (RITALIN) 10 MG tablet . Pt has f/u 12/11 @ 3:30.

## 2017-03-16 ENCOUNTER — Ambulatory Visit (INDEPENDENT_AMBULATORY_CARE_PROVIDER_SITE_OTHER): Payer: Managed Care, Other (non HMO) | Admitting: Neurology

## 2017-03-16 ENCOUNTER — Encounter: Payer: Self-pay | Admitting: Neurology

## 2017-03-16 VITALS — BP 129/77 | HR 94 | Resp 18 | Ht 69.0 in | Wt 156.5 lb

## 2017-03-16 DIAGNOSIS — R413 Other amnesia: Secondary | ICD-10-CM

## 2017-03-16 DIAGNOSIS — R41840 Attention and concentration deficit: Secondary | ICD-10-CM

## 2017-03-16 DIAGNOSIS — F418 Other specified anxiety disorders: Secondary | ICD-10-CM | POA: Diagnosis not present

## 2017-03-16 MED ORDER — FLUOXETINE HCL 20 MG PO TABS: 20.0000 mg | ORAL_TABLET | Freq: Every day | ORAL | 3 refills | Status: DC

## 2017-03-16 MED ORDER — METHYLPHENIDATE HCL 10 MG PO TABS: ORAL_TABLET | ORAL | 0 refills | Status: DC

## 2017-03-16 NOTE — Progress Notes (Signed)
[facility]  PATIENT: [name] [name] DOB: 1951/01/07  REFERRING DOCTOR OR PCP:  Ricke Hey SOURCE: patients and records form [facility] records  _________________________________   HISTORICAL  CHIEF COMPLAINT:  Chief Complaint  Patient presents with  . Memory Loss    Sts. memory is about the same. Thinks Ritalin helps focus/concentration.  Needs refill/fim  . ADD    HISTORY OF PRESENT ILLNESS:  [name] [name] is a 66 yo woman who reporting memory decline.   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    She feels she is about the sane as last year or slightly worse but not much worse.    She does not get lost while driving and remembers to turn off appliances.  She doesn't always remember what she has just talked about in conversation.   This frustrates her when it occurs at work.    She usually feels that with hints she remembers what was forgotten.    She felt Ritalin helped her focus and attention.   She tolerates it well.  She feels she has a mild depression and sometimes gets frustrated easily..   She is on fluoxetine.    She sleeps well most nights getting about 6 hours of sleep.   She will usually just take Ritalin twice a day because the third one sometimes caused insomnia.  ______________________________- From 02/08/2016 Short term memory issues are her biggest concern.    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  E doesn't remember to make lists most days.  She uses some reminders Holiday representative notes)   She denies any difficulty doing math.  She has not become lost while driving. She has not had difficulty remembering to turn appliances off.   Attention and focus are poor.    Ritalin and feels it helped her focus but not her memory.      Her mother was in her 24s when she had Alzheimer's disease. Her sister, brother and aunt all had strokes.      CT scan 12/10/2014 showed moderate to severe periventricular subcortical white matter changes consistent with chronic microvascular ischemia.    Mood:   She has had depression and gets frustrated easily.  She feels her husband hasn't been responsive enough to her problems.   She gets frustrated easily, especially at her husband.   She stopped escitalopram and had more mood swings.    Sertraline made her sleepy   Sleep:    She feels that she falls asleep and stays asleep well. Husband has not noted her snoring or gasping for air at night.   REVIEW OF SYSTEMS: Constitutional: No fevers, chills, sweats, or change in appetite.    Some fatigue Eyes: No visual changes, double vision, eye pain Ear, nose and throat: No hearing loss, ear pain, nasal congestion, sore throat Cardiovascular: No chest pain, palpitations Respiratory: reports shortness of breath and wheezes   No snores GastrointestinaI: No nausea, vomiting, diarrhea, abdominal pain, fecal incontinence Genitourinary: No dysuria, urinary retention or frequency.  No nocturia. Musculoskeletal: No neck pain, back pain but notes pain in the hips and knees. . Integumentary: No rash, pruritus, skin lesions Neurological: as above Psychiatric: Notes depression at this time but no anxiety Endocrine: No palpitations, diaphoresis, change in appetite, change in weigh or increased thirst Hematologic/Lymphatic: No anemia, purpura, petechiae. Allergic/Immunologic: No itchy/runny eyes,  nasal congestion,   She has some seasonal allergies.  ALLERGIES: Allergies  Allergen Reactions  . Aspirin Shortness Of Breath    HOME MEDICATIONS:  Current Outpatient Prescriptions:  .  albuterol (PROVENTIL HFA;VENTOLIN HFA) 108 (90 Base) MCG/ACT inhaler, Inhale 2 puffs into the lungs every 6 (six) hours as needed for wheezing or shortness of breath., Disp: 1 Inhaler, Rfl: 6 .  clidinium-chlordiazePOXIDE (LIBRAX) 5-2.5 MG capsule, TAKE 1 CAPSULE 3 TIMES DAILY  AS NEEDED, Disp: 90 capsule, Rfl: 3 .  clopidogrel (PLAVIX) 75 MG tablet, TAKE 1 TABLET (75 MG TOTAL) BY MOUTH DAILY., Disp: 30 tablet, Rfl: 11 .  FLUoxetine (PROZAC) 20 MG tablet, Take 1 tablet (20 mg total) by mouth daily., Disp: 90 tablet, Rfl: 3 .  hyoscyamine (LEVSIN SL) 0.125 MG SL tablet, TAKE 1 TABLET (0.125 MG TOTAL) BY MOUTH EVERY 4 (FOUR) HOURS AS NEEDED FOR CRAMPING., Disp: 120 tablet, Rfl: 5 .  Ipratropium-Albuterol (COMBIVENT RESPIMAT) 20-100 MCG/ACT AERS respimat, INHALE 1 PUFF INTO THE LUNGS 2 (TWO) TIMES DAILY., Disp: 1 Inhaler, Rfl: 3 .  methylphenidate (RITALIN) 10 MG tablet, Take one pill po tid, Disp: 90 tablet, Rfl: 0 .  mometasone-formoterol (DULERA) 100-5 MCG/ACT AERO, Inhale 2 puffs into the lungs 2 (two) times daily., Disp: , Rfl:  .  pantoprazole (PROTONIX) 40 MG tablet, TAKE 1 TABLET (40 MG TOTAL) BY MOUTH 2 (TWO) TIMES DAILY., Disp: 60 tablet, Rfl: 2 .  predniSONE (DELTASONE) 10 MG tablet, Take 1 tablet (10 mg total) by mouth daily with breakfast., Disp: 30 tablet, Rfl: 6 .  Spacer/Aero-Holding Chambers (AEROCHAMBER MV) inhaler, Use as instructed, Disp: 1 each, Rfl: 0  PAST MEDICAL HISTORY: Past Medical History:  Diagnosis Date  . Allergic rhinitis, cause unspecified    failed allergy vaccine  . Allergy   . Anxiety    panic attacks  . Arthritis   . COPD (chronic obstructive pulmonary disease) ([place])   . Depression   . Diverticulosis   . Extrinsic asthma, unspecified   . GERD (gastroesophageal reflux disease)   . Hiatal hernia   . Polyp of nasal cavity   . PONV (postoperative nausea and vomiting)   . S/P dilatation of esophageal stricture 08-2008  . 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 Unspecified sinusitis (chronic)   . Vision abnormalities     PAST SURGICAL HISTORY: Past Surgical History:  Procedure Laterality Date  . BUNIONECTOMY     left foot  . CHOLECYSTECTOMY  05/05/2012   Procedure: LAPAROSCOPIC CHOLECYSTECTOMY WITH INTRAOPERATIVE  CHOLANGIOGRAM;  Surgeon: Gayland Curry, MD,FACS;  Location: [facility];  Service: General;  Laterality: N/A;  . COLONOSCOPY    . COLONOSCOPY W/ BIOPSIES AND POLYPECTOMY    . NASAL POLYP SURGERY     x 3   . POLYPECTOMY    . THUMB FUSION  10/13   rt hand  pinned but pin removed  . TONSILLECTOMY      FAMILY HISTORY: Family History  Problem Relation Age of Onset  . Colon cancer Maternal Grandfather        unsure age of onset   . Cancer Maternal Grandfather        colon  . Stroke Maternal Aunt   . Heart disease Father        Vague history  . Prostate cancer Brother   . Ataxia Daughter        died at age 23.5    SOCIAL HISTORY:  Social History   Social History  . Marital  status: Married    Spouse name: N/A  . Number of children: 2  . Years of education: N/A   Occupational History  . Theatre manager   .  [employer]   Social History Main Topics  . Smoking status: Former Smoker    Packs/day: 0.50    Years: 0.00    Types: Cigarettes    Quit date: 06/14/1988  . Smokeless tobacco: Never Used  . Alcohol use No  . Drug use: No  . Sexual activity: Yes    Birth control/ protection: Post-menopausal   Other Topics Concern  . Not on file   Social History Narrative   Lives with husband.       PHYSICAL EXAM  Vitals:   03/16/17 1303  BP: 129/77  Pulse: 94  Resp: 18  Weight: 156 lb 8 oz (71 kg)  Height: 5\' 9"  (1.753 m)    Body mass index is 23.11 kg/m.   General: The patient is well-developed and well-nourished and in no acute distress   Neurologic Exam  Mental status: The patient is alert and oriented x 3 at the time of the examination. The patient has reduced recent (3/4 without prompt and 4/4 with prompt ) and good remote memory, with a slightly reduced  attention span and concentration ability (100-93-85-78-71;   WORLD-DLROW).     Speech is normal.     Cranial nerves: Extraocular movements are full.   Facial symmetry is present. There is good  facial sensation to soft touch bilaterally.Facial strength is normal.  Trapezius and sternocleidomastoid strength is normal. No dysarthria is noted.  The tongue is midline, and the patient has symmetric elevation of the soft palate.    Motor:  Muscle bulk is normal.   Tone is normal. Strength is  5 / 5 in all 4 extremities.   Sensory: Sensory testing is intact to touch,.   Coordination: Cerebellar testing reveals good finger-nose-finger and heel-to-shin bilaterally.  Gait and station: Station is normal.   Gait is normal. Tandem gait is minimally wide.   Marland Kitchen   DTRs are normal and symmetric.   Marland Kitchen        DIAGNOSTIC DATA (LABS, IMAGING, TESTING) - I reviewed patient records, labs, notes, testing and imaging myself where available.  Lab Results  Component Value Date   WBC 7.3 09/01/2012   HGB 12.0 09/01/2012   HCT 36.8 09/01/2012   MCV 81.2 09/01/2012   PLT 293 09/01/2012      Component Value Date/Time   NA 143 10/02/2014 1548   K 4.2 10/02/2014 1548   CL 102 10/02/2014 1548   CO2 25 10/02/2014 1548   GLUCOSE 81 10/02/2014 1548   GLUCOSE 94 09/01/2012 1542   BUN 14 10/02/2014 1548   CREATININE 1.01 (H) 10/02/2014 1548   CALCIUM 9.3 10/02/2014 1548   PROT 6.6 10/02/2014 1548   ALBUMIN 4.3 10/02/2014 1548   AST 13 10/02/2014 1548   ALT 9 10/02/2014 1548   ALKPHOS 87 10/02/2014 1548   BILITOT <0.2 10/02/2014 1548   GFRNONAA 59 (L) 10/02/2014 1548   GFRAA 68 10/02/2014 1548   Lab Results  Component Value Date   CHOL 228 (H) 09/01/2012   HDL 60.00 09/01/2012   LDLDIRECT 146.4 09/01/2012   TRIG 167.0 (H) 09/01/2012   CHOLHDL 4 09/01/2012   Lab Results  Component Value Date   HGBA1C 5.7 09/01/2012   Lab Results  Component Value Date   VITAMINB12 507 10/02/2014   Lab Results  Component Value  Date   TSH 1.210 10/02/2014       ASSESSMENT AND PLAN  Memory difficulties  Memory loss - Plan: MR BRAIN WO CONTRAST  Attention deficit  Depression with  anxiety   1.   Increase Prozac to 20 mg daily for depression.  2.   Continue Ritalin 10 mg by mouth  Up to 3 times a day.  3.   Continue Clopidogrel 75 mg po daily.  4.   Her memory is worse this year than last year. We need to check an MRI of the brain without contrast to determine if changes are more consistent with ischemic changes, degeneration such as Alzheimer's or other etiology. RTC 6 months or as needed for new or worsening neurologic symptoms.   Richard A. Felecia Shelling, MD, PhD 27/11/7009, 0:03 PM Certified in Neurology, Clinical Neurophysiology, Sleep Medicine, Pain Medicine and Neuroimaging  [facility] [address] [place], [place] [address] [phone]

## 2017-04-20 ENCOUNTER — Other Ambulatory Visit: Payer: Managed Care, Other (non HMO)

## 2017-04-22 ENCOUNTER — Other Ambulatory Visit: Payer: Managed Care, Other (non HMO)

## 2017-04-22 ENCOUNTER — Ambulatory Visit
Admission: RE | Admit: 2017-04-22 | Discharge: 2017-04-22 | Disposition: A | Discharge status: Home / Self Care | Payer: Managed Care, Other (non HMO) | Source: Ambulatory Visit | Attending: Neurology | Admitting: Neurology

## 2017-04-22 ENCOUNTER — Other Ambulatory Visit: Payer: Self-pay | Admitting: Neurology

## 2017-04-22 DIAGNOSIS — R413 Other amnesia: Secondary | ICD-10-CM | POA: Diagnosis not present

## 2017-04-25 ENCOUNTER — Other Ambulatory Visit: Payer: Self-pay | Admitting: Neurology

## 2017-04-25 DIAGNOSIS — R413 Other amnesia: Secondary | ICD-10-CM

## 2017-04-25 DIAGNOSIS — R9082 White matter disease, unspecified: Secondary | ICD-10-CM | POA: Insufficient documentation

## 2017-04-27 ENCOUNTER — Telehealth: Payer: Self-pay | Admitting: *Deleted

## 2017-04-27 NOTE — Telephone Encounter (Signed)
-----   Message from Britt Bottom, MD sent at 04/25/2017  5:42 PM EST ----- Please let her know that the MRI of the brain shows a lot more changes than expected for her age and there are some blood tests that I would like to check.  I have placed an order for homocystine and ANCA.  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

## 2017-04-27 NOTE — Telephone Encounter (Signed)
Spoke with [name] [name] this afternoon and reviewed below MRI results with her, along with RAS recommendation for addition lab work to investigate source of changes seen on MRI.  She verbalized understanding of same, is agreeable.  Will come in on Monday (05-02-17) for labs.  Will ask for me so I can bring [facility] order sheet for her to sign./fim

## 2017-05-02 ENCOUNTER — Other Ambulatory Visit (INDEPENDENT_AMBULATORY_CARE_PROVIDER_SITE_OTHER): Payer: Self-pay

## 2017-05-02 DIAGNOSIS — R413 Other amnesia: Secondary | ICD-10-CM

## 2017-05-02 DIAGNOSIS — Z0289 Encounter for other administrative examinations: Secondary | ICD-10-CM

## 2017-05-02 DIAGNOSIS — R9082 White matter disease, unspecified: Secondary | ICD-10-CM

## 2017-05-04 ENCOUNTER — Telehealth: Payer: Self-pay | Admitting: *Deleted

## 2017-05-04 LAB — PAN-ANCA
ANCA Proteinase 3: 10.9 U/mL — ABNORMAL HIGH (ref 0.0–3.5)
Atypical pANCA: <1:20 {titer}

## 2017-05-04 LAB — HOMOCYSTEINE: HOMOCYSTEINE: 16.1 umol/L — AB (ref 0.0–15.0)

## 2017-05-04 NOTE — Telephone Encounter (Signed)
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 Lets start Foltx one pill daily.  (The anti-proteinase 3 was elevated but the C-ANCA and P-ANCA was normal so this is likely a false positive.)

## 2017-05-04 NOTE — Telephone Encounter (Signed)
I have spoken with [name] [name] and advised the code for the test RAS ordered is 1175 ([place]3, ([place]) sequencing test)/fim

## 2017-05-04 NOTE — Telephone Encounter (Signed)
ICD 10 codes faxed to [place], fax# [phone].  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

## 2017-05-04 NOTE — Telephone Encounter (Signed)
Pt calling re: testing that Dr Felecia Shelling wants her to have done.  Pt states the place she was instructed to call needs the code for this type testing to see if it will be covered under her insurance.  Please call

## 2017-05-04 NOTE — Telephone Encounter (Signed)
LMTC for lab results/fim 

## 2017-05-04 NOTE — Telephone Encounter (Signed)
Pt called to advise she is wanting to proceed with the test. Thank you

## 2017-05-04 NOTE — Telephone Encounter (Signed)
[name] [name]/[facility] [phone] needs ICD10 code to proceed with the test. He said it can be faxed to [phone]

## 2017-05-10 ENCOUNTER — Telehealth: Payer: Self-pay | Admitting: Neurology

## 2017-05-10 ENCOUNTER — Telehealth: Payer: Self-pay | Admitting: *Deleted

## 2017-05-10 MED ORDER — METHYLPHENIDATE HCL 10 MG PO TABS: ORAL_TABLET | ORAL | 0 refills | Status: DC

## 2017-05-10 MED ORDER — FOLIC ACID-VIT B6-VIT B12 2.5-25-1 MG PO TABS: 1.0000 | ORAL_TABLET | Freq: Every day | ORAL | 3 refills | Status: DC

## 2017-05-10 NOTE — Telephone Encounter (Signed)
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 Lets start Foltx one pill daily.  (The anti-proteinase 3 was elevated but the C-ANCA and P-ANCA was normal so this is likely a false positive.)

## 2017-05-10 NOTE — Addendum Note (Signed)
Addended by: France Ravens I on: 05/10/2017 02:52 PM   Modules accepted: Orders

## 2017-05-10 NOTE — Telephone Encounter (Signed)
Patient requesting refill of  methylphenidate (RITALIN) 10 MG tablet.

## 2017-05-10 NOTE — Telephone Encounter (Signed)
Spoke with [name] [name] this morning and reviewed lab results with her.  She verbalized understanding of same, is agreeable to starting Foltx.  [facility].  She also sts. that her ins. co. advised her that [facility] lab is covered 100%; she has received the kit from [place] and plans to have lab drawn this week/fim

## 2017-05-10 NOTE — Telephone Encounter (Signed)
Rx. up front [facility]/fim 

## 2017-05-10 NOTE — Telephone Encounter (Signed)
Rx. awaiting RAS sig/fim 

## 2017-05-16 ENCOUNTER — Telehealth: Payer: Self-pay | Admitting: Internal Medicine

## 2017-05-16 NOTE — Telephone Encounter (Signed)
Spoke with pt and advised her that I printed off the [employer] forms for her to fill out and left up front in the folder. She will come by tomorrow to fill them out so we can send them in. Nothing further is needed.

## 2017-05-17 ENCOUNTER — Telehealth: Payer: Self-pay | Admitting: Neurology

## 2017-05-17 ENCOUNTER — Telehealth: Payer: Self-pay | Admitting: Internal Medicine

## 2017-05-17 NOTE — Telephone Encounter (Signed)
Pt filled out pt assistance forms and these are in the folder up front for CY. Will forward to KW to follow up on forms.

## 2017-05-17 NOTE — Telephone Encounter (Signed)
I have spoken with [name] [name] this afternoon and explained that specialty labs like [facility] labs may take several weeks.  I wish I had an exact time frame for her, but I don't.  Will call her as soon as the results are back and RAS has seen them.  She verbalized understanding of same/fim

## 2017-05-17 NOTE — Telephone Encounter (Signed)
Pt is wanting to know the turn around time for the labs she had done recently.(she does not know the name of the facility where she had labs drawn) Please call to advise

## 2017-05-18 NOTE — Telephone Encounter (Addendum)
Called spoke with patient yesterday 12.5.18 to verify the medication she is requesting patient assistance for Per patient, she is asking for [facility] 100 Advised patient we will take of this for her  Per the application information on [email or web address] the application can only be mailed, no fax Original application with med list placed in the mail to:  [place] [phone]  Copy of application placed in scan folder Will sign off

## 2017-05-19 NOTE — Telephone Encounter (Signed)
Were these taken care of ?

## 2017-05-24 ENCOUNTER — Ambulatory Visit: Payer: Managed Care, Other (non HMO) | Admitting: Neurology

## 2017-05-30 ENCOUNTER — Telehealth: Payer: Self-pay | Admitting: Internal Medicine

## 2017-05-30 NOTE — Telephone Encounter (Signed)
Spoke with pt. She is aware of CY's recommendations. States that she has an ENT and will call and make her own appointment. Nothing further was needed.

## 2017-05-30 NOTE — Telephone Encounter (Signed)
Mouth becoming tender and painful- pt attributes this to inhaler use.  Pt is not on a new inhaler.  Uses Dulera and Ventolin.  Pt has been on dulera since 2014.  Verified she is also using a spacer with inhaler.   Pt is rinsing mouth after inhaler use.  Also notes using biotene mouthwash and a special toothpaste given by dentist to help with dry mouth.  Pt requesting additional recs.    [facility] [address] [facility] [place]   CY please advise.  Thanks.

## 2017-05-30 NOTE — Telephone Encounter (Signed)
I would like an ENT to look at the lining of her mouth and see if they can tell what hurts, since she has used [facility] with a spacer for a long time, without hurting. If she doesn't have and ENT, please refer to [facility] for dx mouth pain.

## 2017-06-02 ENCOUNTER — Telehealth: Payer: Self-pay | Admitting: Neurology

## 2017-06-02 NOTE — Telephone Encounter (Signed)
I have spoken with [name] [name]--she sts. she received a letter from her ins. co. stating they need more information regarding ? lab work that was ordered.  I do not recall getting a letter from her ins. co.  She will fax her copy to me at [phone]

## 2017-06-02 NOTE — Telephone Encounter (Signed)
I received the faxed letter from [place].  I spoke with her and explained that the letter is a request for more information  from Dr. Legrand Como Cartwright's office.  She verbalized understanding of same/fim

## 2017-06-02 NOTE — Telephone Encounter (Signed)
Pt is asking for a call from RN Faith to know if based upon a Blood work request from Dr Felecia Shelling has RN Faith received a letter/form from [employer] company [place]) asking for information re: the procedure she had done mid Nov. On [address], lab work.  Pt is asking to be called

## 2017-06-08 ENCOUNTER — Telehealth: Payer: Self-pay | Admitting: Internal Medicine

## 2017-06-08 NOTE — Telephone Encounter (Signed)
Dr. Felecia Shelling has reviewed the [facility] lab and it was a normal result (copy sent for scanning).  He ask that we call the patient.  Spoke to her and she verbalized understanding of the normal result.

## 2017-06-08 NOTE — Telephone Encounter (Signed)
Spoke with patient. She was checking on the status of the patient assistance forms for [facility]. Advised patient that the forms were mailed out on 12/5. Patient verbalized understanding. Nothing else needed at time of call.

## 2017-06-24 ENCOUNTER — Telehealth: Payer: Self-pay | Admitting: Internal Medicine

## 2017-06-24 MED ORDER — FLUCONAZOLE 100 MG PO TABS: 100.0000 mg | ORAL_TABLET | Freq: Every day | ORAL | 0 refills | Status: DC

## 2017-06-24 NOTE — Telephone Encounter (Signed)
States also has scratchy, itchy throat x 4 days.

## 2017-06-24 NOTE — Telephone Encounter (Signed)
Spoke with patient Advised CY recommendations Placed RX for Diflucan 100mg  #7; no refills Pt verbalized understanding Nothing further needed

## 2017-06-24 NOTE — Telephone Encounter (Signed)
Spoke with patient. She stated that she has had thrush in her mouth for the past month. She believes this is coming from using her inhalers.   [facility] [address] [facility] [place], please advise. Thanks!

## 2017-06-24 NOTE — Telephone Encounter (Signed)
Offer dyflucan 100 mg, # 7, 1 daily

## 2017-07-10 ENCOUNTER — Other Ambulatory Visit: Payer: Self-pay | Admitting: Internal Medicine

## 2017-07-11 ENCOUNTER — Other Ambulatory Visit: Payer: Self-pay | Admitting: Neurology

## 2017-07-11 MED ORDER — METHYLPHENIDATE HCL 10 MG PO TABS: ORAL_TABLET | ORAL | 0 refills | Status: DC

## 2017-07-11 NOTE — Telephone Encounter (Signed)
Last OV 03/16/17. Last Rx written 05/10/17. Rx printed, awaiting sig.

## 2017-07-11 NOTE — Telephone Encounter (Signed)
Pt request refill for methylphenidate (RITALIN) 10 MG tablet

## 2017-07-12 ENCOUNTER — Telehealth: Payer: Self-pay | Admitting: Internal Medicine

## 2017-07-12 NOTE — Telephone Encounter (Signed)
Antibiotics do more harm than good for these viral types of infections.   Suggest otc cold and flu remedies like Tylenol Cold and Flu or Theraflu, Mucinex-DM, or similar. Stay well hydrated. Ok to use throat lozenges, otc cough syrups.  If gets worse, with fever, green/ brown sputum, then let us know.

## 2017-07-12 NOTE — Telephone Encounter (Signed)
Spoke with pt. She is aware of CY's recommendations. Nothing further was needed. 

## 2017-07-12 NOTE — Telephone Encounter (Signed)
Rx signed and at the front.

## 2017-07-12 NOTE — Telephone Encounter (Signed)
Spoke with the pt  She c/o fatigue and sore throat x 2 days  She is not coughing or having aches, chills, fever, or sweats She is taking mucinex and otc throat lozenges with minimal relief  She is asking if [facility] can call something in  Please advise thanks! Allergies  Allergen Reactions  . Aspirin Shortness Of Breath   Current Outpatient Medications on File Prior to Visit  Medication Sig Dispense Refill  . albuterol (PROVENTIL HFA;VENTOLIN HFA) 108 (90 Base) MCG/ACT inhaler Inhale 2 puffs into the lungs every 6 (six) hours as needed for wheezing or shortness of breath. 1 Inhaler 6  . clidinium-chlordiazePOXIDE (LIBRAX) 5-2.5 MG capsule TAKE 1 CAPSULE 3 TIMES DAILY AS NEEDED 90 capsule 3  . clopidogrel (PLAVIX) 75 MG tablet TAKE 1 TABLET (75 MG TOTAL) BY MOUTH DAILY. 30 tablet 11  . fluconazole (DIFLUCAN) 100 MG tablet Take 1 tablet (100 mg total) by mouth daily. 7 tablet 0  . FLUoxetine (PROZAC) 20 MG tablet Take 1 tablet (20 mg total) by mouth daily. 90 tablet 3  . Folic Acid-Vit B8-MLJ Q49 (FOLBEE) 2.5-25-1 MG TABS tablet Take 1 tablet by mouth daily. 90 tablet 3  . hyoscyamine (LEVSIN SL) 0.125 MG SL tablet TAKE 1 TABLET (0.125 MG TOTAL) BY MOUTH EVERY 4 (FOUR) HOURS AS NEEDED FOR CRAMPING. 120 tablet 5  . Ipratropium-Albuterol (COMBIVENT RESPIMAT) 20-100 MCG/ACT AERS respimat INHALE 1 PUFF INTO THE LUNGS 2 (TWO) TIMES DAILY. 1 Inhaler 3  . methylphenidate (RITALIN) 10 MG tablet Take one pill po tid 90 tablet 0  . mometasone-formoterol (DULERA) 100-5 MCG/ACT AERO Inhale 2 puffs into the lungs 2 (two) times daily.    . pantoprazole (PROTONIX) 40 MG tablet TAKE 1 TABLET (40 MG TOTAL) BY MOUTH 2 (TWO) TIMES DAILY. 60 tablet 2  . predniSONE (DELTASONE) 10 MG tablet Take 1 tablet (10 mg total) by mouth daily with breakfast. 30 tablet 6  . Spacer/Aero-Holding Chambers (AEROCHAMBER MV) inhaler Use as instructed 1 each 0   No current facility-administered medications on file prior to visit.

## 2017-07-21 ENCOUNTER — Telehealth: Payer: Self-pay | Admitting: Internal Medicine

## 2017-07-21 MED ORDER — FLUCONAZOLE 150 MG PO TABS: 150.0000 mg | ORAL_TABLET | Freq: Every day | ORAL | 0 refills | Status: DC

## 2017-07-21 NOTE — Telephone Encounter (Signed)
Patient aware. Order sent

## 2017-07-21 NOTE — Telephone Encounter (Signed)
Offer Diflucan 150 mg, # 7, 1 daily

## 2017-07-21 NOTE — Telephone Encounter (Signed)
CY please advise patient states her thrush is coming back. Can we send something in for her.   Last OV 9.27.18  Current Outpatient Medications on File Prior to Visit  Medication Sig Dispense Refill  . albuterol (PROVENTIL HFA;VENTOLIN HFA) 108 (90 Base) MCG/ACT inhaler Inhale 2 puffs into the lungs every 6 (six) hours as needed for wheezing or shortness of breath. 1 Inhaler 6  . clidinium-chlordiazePOXIDE (LIBRAX) 5-2.5 MG capsule TAKE 1 CAPSULE 3 TIMES DAILY AS NEEDED 90 capsule 3  . clopidogrel (PLAVIX) 75 MG tablet TAKE 1 TABLET (75 MG TOTAL) BY MOUTH DAILY. 30 tablet 11  . fluconazole (DIFLUCAN) 100 MG tablet Take 1 tablet (100 mg total) by mouth daily. 7 tablet 0  . FLUoxetine (PROZAC) 20 MG tablet Take 1 tablet (20 mg total) by mouth daily. 90 tablet 3  . Folic Acid-Vit G1-WEX H37 (FOLBEE) 2.5-25-1 MG TABS tablet Take 1 tablet by mouth daily. 90 tablet 3  . hyoscyamine (LEVSIN SL) 0.125 MG SL tablet TAKE 1 TABLET (0.125 MG TOTAL) BY MOUTH EVERY 4 (FOUR) HOURS AS NEEDED FOR CRAMPING. 120 tablet 5  . Ipratropium-Albuterol (COMBIVENT RESPIMAT) 20-100 MCG/ACT AERS respimat INHALE 1 PUFF INTO THE LUNGS 2 (TWO) TIMES DAILY. 1 Inhaler 3  . methylphenidate (RITALIN) 10 MG tablet Take one pill po tid 90 tablet 0  . mometasone-formoterol (DULERA) 100-5 MCG/ACT AERO Inhale 2 puffs into the lungs 2 (two) times daily.    . pantoprazole (PROTONIX) 40 MG tablet TAKE 1 TABLET (40 MG TOTAL) BY MOUTH 2 (TWO) TIMES DAILY. 60 tablet 2  . predniSONE (DELTASONE) 10 MG tablet Take 1 tablet (10 mg total) by mouth daily with breakfast. 30 tablet 6  . Spacer/Aero-Holding Chambers (AEROCHAMBER MV) inhaler Use as instructed 1 each 0   No current facility-administered medications on file prior to visit.    Allergies  Allergen Reactions  . Aspirin Shortness Of Breath

## 2017-09-01 ENCOUNTER — Other Ambulatory Visit: Payer: Self-pay | Admitting: Neurology

## 2017-09-05 ENCOUNTER — Telehealth: Payer: Self-pay | Admitting: Neurology

## 2017-09-05 MED ORDER — METHYLPHENIDATE HCL 10 MG PO TABS: ORAL_TABLET | ORAL | 0 refills | Status: DC

## 2017-09-05 NOTE — Telephone Encounter (Signed)
Pt requesting a refill for methylphenidate (RITALIN) 10 MG tablet

## 2017-09-05 NOTE — Telephone Encounter (Signed)
Rx. awaiting RAS sig/fim 

## 2017-09-05 NOTE — Telephone Encounter (Signed)
Rx. up front [facility]/fim 

## 2017-09-14 ENCOUNTER — Encounter: Payer: Self-pay | Admitting: Neurology

## 2017-09-14 ENCOUNTER — Other Ambulatory Visit: Payer: Self-pay

## 2017-09-14 ENCOUNTER — Ambulatory Visit: Payer: Managed Care, Other (non HMO) | Admitting: Neurology

## 2017-09-14 VITALS — BP 141/83 | HR 108 | Resp 18 | Ht 69.0 in | Wt 174.0 lb

## 2017-09-14 DIAGNOSIS — R9082 White matter disease, unspecified: Secondary | ICD-10-CM | POA: Diagnosis not present

## 2017-09-14 DIAGNOSIS — R413 Other amnesia: Secondary | ICD-10-CM | POA: Diagnosis not present

## 2017-09-14 DIAGNOSIS — F418 Other specified anxiety disorders: Secondary | ICD-10-CM

## 2017-09-14 MED ORDER — METHYLPHENIDATE HCL 10 MG PO TABS: ORAL_TABLET | ORAL | 0 refills | Status: DC

## 2017-09-14 MED ORDER — DONEPEZIL HCL 5 MG PO TABS: 5.0000 mg | ORAL_TABLET | Freq: Every day | ORAL | 11 refills | Status: DC

## 2017-09-14 NOTE — Progress Notes (Signed)
[facility]  PATIENT: [name] [name] DOB: [birth year]  REFERRING DOCTOR OR PCP:  Ricke Hey SOURCE: patients and records form [facility] records  _________________________________   HISTORICAL  CHIEF COMPLAINT:  Chief Complaint  Patient presents with  . Memory Loss    Sts. no change in status since last ov.  Ritaling gives "a little boost of energy." Depression/anxiety about the same.  No HI/SI/fim  . ADD    HISTORY OF PRESENT ILLNESS:  [name] [name] is a 67 yo woman who reporting memory decline.   Update 09/14/2017:     She feels her short-term memory is doing about the same.  It has not worsened since the last visit.  She notes that when she forgets,  she usually remembers if she gets a hint.  She sometimes repeats herself.   She forgets where things are placed in the house.    She is not noting any difficulty driving. Ritalin seems to help her energy but she is not sure it helps memory much .    She tolerates it well and there have not been any complications.       Mood is doing about the same as the last visit. She feels down at times.  She gets frustrated easily, especially if she forgets something.  Fluoxetine may be helping a little bit.  There are no suicidal or homicidal ideations.  Although Ritalin helped her focus, it did not change the mood.  She sleeps well most nights getting about 6 hours.   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    She feels she is about the sane as last year or slightly worse but not much worse.    She does not get lost while driving and remembers to turn off appliances.  She doesn't always remember what she has just talked about in conversation.   This frustrates her when it occurs at work.    She usually feels that with hints she remembers what was forgotten.    She felt Ritalin helped her focus and attention.   She tolerates it well.  She feels  she has a mild depression and sometimes gets frustrated easily..   She is on fluoxetine.    She sleeps well most nights getting about 6 hours of sleep.   She will usually just take Ritalin twice a day because the third one sometimes caused insomnia.  ______________________________- From 02/08/2016 Short term memory issues are her biggest concern.    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  E doesn't remember to make lists most days.  She uses some reminders Holiday representative notes)   She denies any difficulty doing math.  She has not become lost while driving. She has not had difficulty remembering to turn appliances off.   Attention and focus are poor.    Ritalin and feels it helped her focus but not her memory.      Her mother was in her 67s when she had Alzheimer's disease. Her sister, brother and aunt all had strokes.     CT scan 12/10/2014 showed moderate to severe periventricular subcortical white matter changes consistent with chronic microvascular ischemia.    Mood:   She has had depression and gets frustrated easily.  She feels her husband hasn't been responsive enough to her problems.   She gets frustrated easily, especially  at her husband.   She stopped escitalopram and had more mood swings.    Sertraline made her sleepy   Sleep:    She feels that she falls asleep and stays asleep well. Husband has not noted her snoring or gasping for air at night.   REVIEW OF SYSTEMS: Constitutional: No fevers, chills, sweats, or change in appetite.    Some fatigue Eyes: No visual changes, double vision, eye pain Ear, nose and throat: No hearing loss, ear pain, nasal congestion, sore throat Cardiovascular: No chest pain, palpitations Respiratory: reports shortness of breath and wheezes   No snores GastrointestinaI: No nausea, vomiting, diarrhea, abdominal pain, fecal incontinence Genitourinary: No dysuria, urinary retention or frequency.  No nocturia. Musculoskeletal: No  neck pain, back pain but notes pain in the hips and knees. . Integumentary: No rash, pruritus, skin lesions Neurological: as above Psychiatric: Notes depression at this time but no anxiety Endocrine: No palpitations, diaphoresis, change in appetite, change in weigh or increased thirst Hematologic/Lymphatic: No anemia, purpura, petechiae. Allergic/Immunologic: No itchy/runny eyes, nasal congestion,   She has some seasonal allergies.  ALLERGIES: Allergies  Allergen Reactions  . Aspirin Shortness Of Breath    HOME MEDICATIONS:  Current Outpatient Medications:  .  albuterol (PROVENTIL HFA;VENTOLIN HFA) 108 (90 Base) MCG/ACT inhaler, Inhale 2 puffs into the lungs every 6 (six) hours as needed for wheezing or shortness of breath., Disp: 1 Inhaler, Rfl: 6 .  clidinium-chlordiazePOXIDE (LIBRAX) 5-2.5 MG capsule, TAKE 1 CAPSULE 3 TIMES DAILY AS NEEDED, Disp: 90 capsule, Rfl: 3 .  clopidogrel (PLAVIX) 75 MG tablet, TAKE 1 TABLET (75 MG TOTAL) BY MOUTH DAILY., Disp: 30 tablet, Rfl: 10 .  FLUoxetine (PROZAC) 20 MG tablet, Take 1 tablet (20 mg total) by mouth daily., Disp: 90 tablet, Rfl: 3 .  Ipratropium-Albuterol (COMBIVENT RESPIMAT) 20-100 MCG/ACT AERS respimat, INHALE 1 PUFF INTO THE LUNGS 2 (TWO) TIMES DAILY., Disp: 1 Inhaler, Rfl: 3 .  methylphenidate (RITALIN) 10 MG tablet, Take one pill po tid, Disp: 90 tablet, Rfl: 0 .  mometasone-formoterol (DULERA) 100-5 MCG/ACT AERO, Inhale 2 puffs into the lungs 2 (two) times daily., Disp: , Rfl:  .  pantoprazole (PROTONIX) 40 MG tablet, TAKE 1 TABLET (40 MG TOTAL) BY MOUTH 2 (TWO) TIMES DAILY., Disp: 60 tablet, Rfl: 2 .  predniSONE (DELTASONE) 10 MG tablet, Take 1 tablet (10 mg total) by mouth daily with breakfast., Disp: 30 tablet, Rfl: 6 .  Spacer/Aero-Holding Chambers (AEROCHAMBER MV) inhaler, Use as instructed, Disp: 1 each, Rfl: 0 .  donepezil (ARICEPT) 5 MG tablet, Take 1 tablet (5 mg total) by mouth at bedtime., Disp: 30 tablet, Rfl: 11 .   fluconazole (DIFLUCAN) 100 MG tablet, Take 1 tablet (100 mg total) by mouth daily. (Patient not taking: Reported on 09/14/2017), Disp: 7 tablet, Rfl: 0 .  fluconazole (DIFLUCAN) 150 MG tablet, Take 1 tablet (150 mg total) by mouth daily. (Patient not taking: Reported on 09/14/2017), Disp: 7 tablet, Rfl: 0 .  Folic Acid-Vit J6-RCV E93 (FOLBEE) 2.5-25-1 MG TABS tablet, Take 1 tablet by mouth daily., Disp: 90 tablet, Rfl: 3 .  hyoscyamine (LEVSIN SL) 0.125 MG SL tablet, TAKE 1 TABLET (0.125 MG TOTAL) BY MOUTH EVERY 4 (FOUR) HOURS AS NEEDED FOR CRAMPING., Disp: 120 tablet, Rfl: 5  PAST MEDICAL HISTORY: Past Medical History:  Diagnosis Date  . Allergic rhinitis, cause unspecified    failed allergy vaccine  . Allergy   . Anxiety    panic attacks  . Arthritis   . COPD (  chronic obstructive pulmonary disease) ([place])   . Depression   . Diverticulosis   . Extrinsic asthma, unspecified   . GERD (gastroesophageal reflux disease)   . Hiatal hernia   . Polyp of nasal cavity   . PONV (postoperative nausea and vomiting)   . S/P dilatation of esophageal stricture 08-2008  . 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 Unspecified sinusitis (chronic)   . Vision abnormalities     PAST SURGICAL HISTORY: Past Surgical History:  Procedure Laterality Date  . BUNIONECTOMY     left foot  . CHOLECYSTECTOMY  05/05/2012   Procedure: LAPAROSCOPIC CHOLECYSTECTOMY WITH INTRAOPERATIVE CHOLANGIOGRAM;  Surgeon: Gayland Curry, MD,FACS;  Location: [facility];  Service: General;  Laterality: N/A;  . COLONOSCOPY    . COLONOSCOPY W/ BIOPSIES AND POLYPECTOMY    . NASAL POLYP SURGERY     x 3   . POLYPECTOMY    . THUMB FUSION  10/13   rt hand  pinned but pin removed  . TONSILLECTOMY      FAMILY HISTORY: Family History  Problem Relation Age of Onset  . Colon cancer Maternal Grandfather        unsure age of onset   . Cancer Maternal Grandfather        colon  . Stroke Maternal Aunt   . Heart disease Father         Vague history  . Prostate cancer Brother   . Ataxia Daughter        died at age 19.5    SOCIAL HISTORY:  Social History   Socioeconomic History  . Marital status: Married    Spouse name: Not on file  . Number of children: 2  . Years of education: Not on file  . Highest education level: Not on file  Occupational History  . Occupation: Scientist, research (medical): [employer]  Social Needs  . Financial resource strain: Not on file  . Food insecurity:    Worry: Not on file    Inability: Not on file  . Transportation needs:    Medical: Not on file    Non-medical: Not on file  Tobacco Use  . Smoking status: Former Smoker    Packs/day: 0.50    Years: 0.00    Pack years: 0.00    Types: Cigarettes    Last attempt to quit: 06/14/1988    Years since quitting: 29.2  . Smokeless tobacco: Never Used  Substance and Sexual Activity  . Alcohol use: No  . Drug use: No  . Sexual activity: Yes    Birth control/protection: Post-menopausal  Lifestyle  . Physical activity:    Days per week: Not on file    Minutes per session: Not on file  . Stress: Not on file  Relationships  . Social connections:    Talks on phone: Not on file    Gets together: Not on file    Attends religious service: Not on file    Active member of club or organization: Not on file    Attends meetings of clubs or organizations: Not on file    Relationship status: Not on file  . Intimate partner violence:    Fear of current or ex partner: Not on file    Emotionally abused: Not on file    Physically abused: Not on file    Forced sexual activity: Not on file  Other Topics Concern  . Not on file  Social History Narrative   Lives with  husband.       PHYSICAL EXAM  Vitals:   09/14/17 1608  BP: (!) 141/83  Pulse: (!) 108  Resp: 18  Weight: 174 lb (78.9 kg)  Height: 5\' 9"  (1.753 m)    Body mass index is 25.7 kg/m.   General: The patient is well-developed and well-nourished and in no  acute distress   Neurologic Exam  Mental status: The patient is alert and oriented x 3 at the time of the examination. The patient has good recent (3/3 without prompt) and good remote memory, with a slightly reduced  attention span and concentration ability ([id number]);   [facility]).     Speech is normal.     Cranial nerves: Extraocular movements are full.   Facial strength and sensation is normal.  Trapezius strength is strong.. No dysarthria is noted.  The tongue is midline, and the patient has symmetric elevation of the soft palate.    Motor:  Muscle bulk is normal.   Tone is normal. Strength is  5 / 5 in all 4 extremities.   Sensory: Sensory testing is intact to touch,.   Coordination: Cerebellar testing reveals good finger-nose-finger and heel-to-shin bilaterally.  Gait and station: Station is normal.   Gait is normal. Tandem gait is minimally wide.   Marland Kitchen   DTRs are normal and symmetric.   Marland Kitchen        DIAGNOSTIC DATA (LABS, IMAGING, TESTING) - I reviewed patient records, labs, notes, testing and imaging myself where available.  Lab Results  Component Value Date   WBC 7.3 09/01/2012   HGB 12.0 09/01/2012   HCT 36.8 09/01/2012   MCV 81.2 09/01/2012   PLT 293 09/01/2012      Component Value Date/Time   NA 143 10/02/2014 1548   K 4.2 10/02/2014 1548   CL 102 10/02/2014 1548   CO2 25 10/02/2014 1548   GLUCOSE 81 10/02/2014 1548   GLUCOSE 94 09/01/2012 1542   BUN 14 10/02/2014 1548   CREATININE 1.01 (H) 10/02/2014 1548   CALCIUM 9.3 10/02/2014 1548   PROT 6.6 10/02/2014 1548   ALBUMIN 4.3 10/02/2014 1548   AST 13 10/02/2014 1548   ALT 9 10/02/2014 1548   ALKPHOS 87 10/02/2014 1548   BILITOT <0.2 10/02/2014 1548   GFRNONAA 59 (L) 10/02/2014 1548   GFRAA 68 10/02/2014 1548   Lab Results  Component Value Date   CHOL 228 (H) 09/01/2012   HDL 60.00 09/01/2012   LDLDIRECT 146.4 09/01/2012   TRIG 167.0 (H) 09/01/2012   CHOLHDL 4 09/01/2012   Lab Results  Component  Value Date   HGBA1C 5.7 09/01/2012   Lab Results  Component Value Date   VITAMINB12 507 10/02/2014   Lab Results  Component Value Date   TSH 1.210 10/02/2014       ASSESSMENT AND PLAN  Memory difficulties  White matter abnormality on MRI of brain  Depression with anxiety    1.   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  Trial of donepezil 5 mg.   Can increase to 10 mg if well tolerated 2.   Continue Prozac to 20 mg daily for depression.    Continue Ritalin 10 mg by mouth  Up to 3 times a day.  3.   Continue Clopidogrel 75 mg po daily and [place] for vascular changes. 4.   RTC 6 months or as needed for new or worsening neurologic symptoms.   Quinnten Calvin A. Felecia Shelling, MD, PhD 4/0/9811, 9:14 PM Certified  in Neurology, Clinical Neurophysiology, Sleep Medicine, Pain Medicine and Neuroimaging  [facility] [address] [place], [place] [address] [phone]

## 2017-09-20 ENCOUNTER — Telehealth: Payer: Self-pay | Admitting: Neurology

## 2017-09-20 MED ORDER — FLUOXETINE HCL 10 MG PO TABS: 10.0000 mg | ORAL_TABLET | Freq: Every day | ORAL | 1 refills | Status: DC

## 2017-09-20 NOTE — Telephone Encounter (Signed)
Spoke with [name] [name] today.  She sts. since increasing Fluoxetine to 20mg , has been sleepier, foggier.  Wants to decrease back to Fluoxetine 10mg  daily.  20mg  tablets are scored, but she does not want to take 1/2 of a 20 mg tablet--wants new rx. for 10mg .  Ok per RAS. Rx. for Fluoxetine 10mg  po qd escribed to [facility]

## 2017-09-20 NOTE — Telephone Encounter (Signed)
Pt called stating she would like to go back to 10MG  of FLUoxetine (PROZAC) 20 MG tablet stating that the 20MG  is "too much" and is making her very sleepy. Please call to discuss

## 2017-10-13 ENCOUNTER — Other Ambulatory Visit: Payer: Self-pay | Admitting: Physician Assistant

## 2017-10-31 ENCOUNTER — Telehealth: Payer: Self-pay | Admitting: Gastroenterology

## 2017-10-31 ENCOUNTER — Telehealth: Payer: Self-pay | Admitting: Neurology

## 2017-10-31 DIAGNOSIS — R413 Other amnesia: Secondary | ICD-10-CM

## 2017-10-31 NOTE — Telephone Encounter (Signed)
I spoke with Dr. Felecia Shelling and he recommends neuropsych referral for complete cognitive testing with Dr. Halina Andreas. I called and spoke with patient and she is very agreeable to this. I have place order and she is aware that someone will be reaching out to her to schedule this appt.

## 2017-10-31 NOTE — Telephone Encounter (Signed)
Patient had an episode of vomiting yesterday.  She is asking for phenergan.  She is fine today and completely asymptomatic.  She had diarrhea after taking laxatives for constipation.  The vomiting started after taking Gaviscon.  She is advised that with her lack of symptoms there is no indication for phenergan.  This is the first time she has had vomiting in many years.  She is advised that she most likely had a GI bug and to call us back for continued problems

## 2017-10-31 NOTE — Telephone Encounter (Signed)
Patient is calling. She needs to discuss her memory and to find out if there is a place she can go to which would provide help with her memory. Please call and discuss.

## 2017-11-01 NOTE — Addendum Note (Signed)
Addended by: Belinda Block A on: 11/01/2017 10:14 AM   Modules accepted: Orders

## 2017-11-02 ENCOUNTER — Telehealth: Payer: Self-pay

## 2017-11-02 ENCOUNTER — Encounter: Payer: Self-pay | Admitting: Physician Assistant

## 2017-11-02 ENCOUNTER — Other Ambulatory Visit (INDEPENDENT_AMBULATORY_CARE_PROVIDER_SITE_OTHER): Payer: Managed Care, Other (non HMO)

## 2017-11-02 ENCOUNTER — Other Ambulatory Visit: Payer: Self-pay | Admitting: Emergency Medicine

## 2017-11-02 ENCOUNTER — Ambulatory Visit (INDEPENDENT_AMBULATORY_CARE_PROVIDER_SITE_OTHER): Payer: Managed Care, Other (non HMO) | Admitting: Physician Assistant

## 2017-11-02 ENCOUNTER — Telehealth: Payer: Self-pay | Admitting: Gastroenterology

## 2017-11-02 VITALS — BP 108/68 | HR 100 | Ht 69.0 in | Wt 169.0 lb

## 2017-11-02 DIAGNOSIS — R112 Nausea with vomiting, unspecified: Secondary | ICD-10-CM

## 2017-11-02 DIAGNOSIS — R194 Change in bowel habit: Secondary | ICD-10-CM

## 2017-11-02 LAB — CBC WITH DIFFERENTIAL/PLATELET
Basophils Absolute: 0.1 10*3/uL (ref 0.0–0.1)
Basophils Relative: 0.8 % (ref 0.0–3.0)
Eosinophils Absolute: 0.2 10*3/uL (ref 0.0–0.7)
Eosinophils Relative: 2.1 % (ref 0.0–5.0)
HCT: 30.4 % — ABNORMAL LOW (ref 36.0–46.0)
Hemoglobin: 9.6 g/dL — ABNORMAL LOW (ref 12.0–15.0)
Lymphocytes Relative: 14.8 % (ref 12.0–46.0)
Lymphs Abs: 1.7 10*3/uL (ref 0.7–4.0)
MCHC: 31.8 g/dL (ref 30.0–36.0)
MCV: 70.4 fl — ABNORMAL LOW (ref 78.0–100.0)
Monocytes Absolute: 0.8 10*3/uL (ref 0.1–1.0)
Monocytes Relative: 6.8 % (ref 3.0–12.0)
Neutro Abs: 8.7 10*3/uL — ABNORMAL HIGH (ref 1.4–7.7)
Neutrophils Relative %: 75.5 % (ref 43.0–77.0)
Platelets: 439 10*3/uL — ABNORMAL HIGH (ref 150.0–400.0)
RBC: 4.31 Mil/uL (ref 3.87–5.11)
RDW: 17 % — ABNORMAL HIGH (ref 11.5–15.5)
WBC: 11.5 10*3/uL — ABNORMAL HIGH (ref 4.0–10.5)

## 2017-11-02 LAB — LIPASE: Lipase: 169 U/L — ABNORMAL HIGH (ref 11.0–59.0)

## 2017-11-02 LAB — COMPREHENSIVE METABOLIC PANEL
ALT: 18 U/L (ref 0–35)
AST: 22 U/L (ref 0–37)
Albumin: 4.1 g/dL (ref 3.5–5.2)
Alkaline Phosphatase: 93 U/L (ref 39–117)
BUN: 21 mg/dL (ref 6–23)
CO2: 31 mEq/L (ref 19–32)
Calcium: 9.1 mg/dL (ref 8.4–10.5)
Chloride: 104 mEq/L (ref 96–112)
Creatinine, Ser: 0.97 mg/dL (ref 0.40–1.20)
GFR: 60.96 mL/min (ref >60.00)
Glucose, Bld: 95 mg/dL (ref 70–99)
Potassium: 3.8 mEq/L (ref 3.5–5.1)
Sodium: 143 mEq/L (ref 135–145)
Total Bilirubin: 0.4 mg/dL (ref 0.2–1.2)
Total Protein: 7 g/dL (ref 6.0–8.3)

## 2017-11-02 MED ORDER — PROMETHAZINE HCL 25 MG RE SUPP: 25.0000 mg | Freq: Four times a day (QID) | RECTAL | 1 refills | Status: DC | PRN

## 2017-11-02 NOTE — Telephone Encounter (Signed)
Patient will come in this am to see Ellouise Newer, PA

## 2017-11-02 NOTE — Progress Notes (Signed)
Chief Complaint: Vomiting, diarrhea, abdominal pain  HPI:    [name] [name] is a 67 year old female with a past medical history as listed below including difficulty with memory, who follows with Dr. Fuller Plan and presents to clinic today as an urgent work in appointment for vomiting, diarrhea and abdominal pain.    10/31/2017 patient called our clinic describing diarrhea and abdominal pain.  She had taken Gaviscon and started vomiting the day before.  Patient was asymptomatic that day and asking for Phenergan.  She had diarrhea after taking laxatives for constipation.  The vomiting had started after taking Gaviscon.  She was advised that with her lack of symptoms there was no indication for Phenergan.    11/02/2017 patient described continue with episodes of vomiting and did not think it was a stomach bug.    10/06/2015 colonoscopy normal, repeat recommended in 5 years with a more extensive bowel prep.    08/08/2012 EGD for hiatal hernia and otherwise normal.    09/02/2016 office visit Amy Trellis Paganini, PA complained of reflux and some urgency for bowel movements postprandially.  Switched to Protonix 40 mg twice daily, added a course of Carafate slurry 1 g between meals and at bedtime.  Continue Librax before meals.  Add a daily probiotic and suggested align or Culturelle.    Today, explains that Sunday, she woke up after having taking laxatives the night before and had some cramping in her stomach and sat down to have a bowel movement and vomited.  She had taken Gaviscon about 20 to 30 minutes prior to this episode.  The rest of the day she felt somewhat tired and just "bad", this continued into Monday and she stayed home from work.  Tuesday she felt fine and went to work with normal bowel movements.  This morning she woke up and felt somewhat "queasy" and had another episode of vomiting with some cramping before a soft formed stool.  Patient has had 3 soft formed stools today no further vomiting but does  continue to feel "bad".  Explains that her nausea comes and goes.  Does not feel as though she can put anything in her mouth today but was able to eat fine yesterday.  Was on antibiotics which she stopped 4 days ago for a sinus infection.  Did eat out at [employer] on Saturday, but so did her husband and he is not sick.  No sick contacts no other changes in diet. Describes similar episode years ago and being told it was related to her "spastic colon".    Denies fever, chills, blood in her stool, weight loss, dizziness or palpitations.  Past Medical History:  Diagnosis Date  . Allergic rhinitis, cause unspecified    failed allergy vaccine  . Allergy   . Anxiety    panic attacks  . Arthritis   . COPD (chronic obstructive pulmonary disease) ([place])   . Depression   . Diverticulosis   . Extrinsic asthma, unspecified   . GERD (gastroesophageal reflux disease)   . Hiatal hernia   . Polyp of nasal cavity   . PONV (postoperative nausea and vomiting)   . S/P dilatation of esophageal stricture 08-2008  . 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 Unspecified sinusitis (chronic)   . Vision abnormalities     Past Surgical History:  Procedure Laterality Date  . BUNIONECTOMY     left foot  . CHOLECYSTECTOMY  05/05/2012   Procedure: LAPAROSCOPIC CHOLECYSTECTOMY WITH INTRAOPERATIVE CHOLANGIOGRAM;  Surgeon: Gayland Curry, MD,FACS;  Location: [facility] OR;  Service: General;  Laterality: N/A;  . COLONOSCOPY    . COLONOSCOPY W/ BIOPSIES AND POLYPECTOMY    . NASAL POLYP SURGERY     x 3   . POLYPECTOMY    . THUMB FUSION  10/13   rt hand  pinned but pin removed  . TONSILLECTOMY      Current Outpatient Medications  Medication Sig Dispense Refill  . albuterol (PROVENTIL HFA;VENTOLIN HFA) 108 (90 Base) MCG/ACT inhaler Inhale 2 puffs into the lungs every 6 (six) hours as needed for wheezing or shortness of breath. 1 Inhaler 6  . clidinium-chlordiazePOXIDE (LIBRAX) 5-2.5 MG capsule TAKE 1 CAPSULE 3  TIMES DAILY AS NEEDED 90 capsule 3  . clopidogrel (PLAVIX) 75 MG tablet TAKE 1 TABLET (75 MG TOTAL) BY MOUTH DAILY. 30 tablet 10  . donepezil (ARICEPT) 5 MG tablet Take 1 tablet (5 mg total) by mouth at bedtime. 30 tablet 11  . fluconazole (DIFLUCAN) 100 MG tablet Take 1 tablet (100 mg total) by mouth daily. (Patient not taking: Reported on 09/14/2017) 7 tablet 0  . fluconazole (DIFLUCAN) 150 MG tablet Take 1 tablet (150 mg total) by mouth daily. (Patient not taking: Reported on 09/14/2017) 7 tablet 0  . FLUoxetine (PROZAC) 10 MG tablet Take 1 tablet (10 mg total) by mouth daily. 90 tablet 1  . Folic Acid-Vit K1-SWF U93 (FOLBEE) 2.5-25-1 MG TABS tablet Take 1 tablet by mouth daily. 90 tablet 3  . hyoscyamine (LEVSIN SL) 0.125 MG SL tablet TAKE 1 TABLET (0.125 MG TOTAL) BY MOUTH EVERY 4 (FOUR) HOURS AS NEEDED FOR CRAMPING. 120 tablet 5  . Ipratropium-Albuterol (COMBIVENT RESPIMAT) 20-100 MCG/ACT AERS respimat INHALE 1 PUFF INTO THE LUNGS 2 (TWO) TIMES DAILY. 1 Inhaler 3  . methylphenidate (RITALIN) 10 MG tablet Take one pill po tid 90 tablet 0  . mometasone-formoterol (DULERA) 100-5 MCG/ACT AERO Inhale 2 puffs into the lungs 2 (two) times daily.    . pantoprazole (PROTONIX) 40 MG tablet TAKE 1 TABLET (40 MG TOTAL) BY MOUTH 2 (TWO) TIMES DAILY. 60 tablet 2  . pantoprazole (PROTONIX) 40 MG tablet TAKE 1 TABLET (40 MG TOTAL) BY MOUTH 2 (TWO) TIMES DAILY. 60 tablet 0  . predniSONE (DELTASONE) 10 MG tablet Take 1 tablet (10 mg total) by mouth daily with breakfast. 30 tablet 6  . Spacer/Aero-Holding Chambers (AEROCHAMBER MV) inhaler Use as instructed 1 each 0   No current facility-administered medications for this visit.     Allergies as of 11/02/2017 - Review Complete 09/14/2017  Allergen Reaction Noted  . Aspirin Shortness Of Breath     Family History  Problem Relation Age of Onset  . Colon cancer Maternal Grandfather        unsure age of onset   . Cancer Maternal Grandfather        colon  .  Stroke Maternal Aunt   . Heart disease Father        Vague history  . Prostate cancer Brother   . Ataxia Daughter        died at age 21.5    Social History   Socioeconomic History  . Marital status: Married    Spouse name: Not on file  . Number of children: 2  . Years of education: Not on file  . Highest education level: Not on file  Occupational History  . Occupation: Scientist, research (medical): [employer]  Social Needs  . Financial resource strain: Not on file  . Food  insecurity:    Worry: Not on file    Inability: Not on file  . Transportation needs:    Medical: Not on file    Non-medical: Not on file  Tobacco Use  . Smoking status: Former Smoker    Packs/day: 0.50    Years: 0.00    Pack years: 0.00    Types: Cigarettes    Last attempt to quit: 06/14/1988    Years since quitting: 29.4  . Smokeless tobacco: Never Used  Substance and Sexual Activity  . Alcohol use: No  . Drug use: No  . Sexual activity: Yes    Birth control/protection: Post-menopausal  Lifestyle  . Physical activity:    Days per week: Not on file    Minutes per session: Not on file  . Stress: Not on file  Relationships  . Social connections:    Talks on phone: Not on file    Gets together: Not on file    Attends religious service: Not on file    Active member of club or organization: Not on file    Attends meetings of clubs or organizations: Not on file    Relationship status: Not on file  . Intimate partner violence:    Fear of current or ex partner: Not on file    Emotionally abused: Not on file    Physically abused: Not on file    Forced sexual activity: Not on file  Other Topics Concern  . Not on file  Social History Narrative   Lives with husband.      Review of Systems:    Constitutional: No weight loss, fever or chills Cardiovascular: No chest pain Respiratory: No SOB  Gastrointestinal: See HPI and otherwise negative   Physical Exam:  Vital signs: BP 108/68    Pulse 100   Ht 5\' 9"  (1.753 m)   Wt 169 lb (76.7 kg)   BMI 24.96 kg/m    Constitutional:   Ill appearing Pleasant AA female appears to be in NAD, Well developed, Well nourished, alert and cooperative. Clutching her stomach, fatigued Head:  Normocephalic and atraumatic. Eyes:   PEERL, EOMI. No icterus. Conjunctiva pink. Ears:  Normal auditory acuity. Neck:  Supple Throat: Oral cavity and pharynx without inflammation, swelling or lesion.  Respiratory: Respirations even and unlabored. Lungs clear to auscultation bilaterally.   No wheezes, crackles, or rhonchi.  Cardiovascular: Normal S1, S2. No MRG. Regular rate and rhythm. No peripheral edema, cyanosis or pallor.  Gastrointestinal:  Soft, nondistended, mild epigastric ttp. No rebound or guarding. Normal bowel sounds. No appreciable masses or hepatomegaly. Psychiatric: Demonstrates good judgement and reason without abnormal affect or behaviors.  No recent labs or imaging.  Assessment: 1.  Nausea and vomiting: One episode of vomiting on Sunday, none on Monday or Tuesday and one further episode this morning, does continue with intermittent nausea; consider gastritis vs viral vs IBS 2.  Change in bowel habits: Increased in frequency, did use a laxative prior to first diarrheal bowel movement, no true liquid stool since then just softer than normal; consider relation to IBS +/- viral illness  Plan: 1.  Ordered CBC, CMP and lipase for further evaluation 2.  Offered Zofran but patient requests suppositories, feels like if she feels anything in her mouth it will make her more nauseous.  Prescribed Promethazine suppositories 25 mg every 4-6 hours as needed for nausea #15 with a refill 3.  Patient encouraged to continue p.o. liquid intake to stay hydrated. 4.  My nurse will  call the patient in 2 days to check on her.  Patient was advised that if symptoms worsen she needs to go to the ER. 5.  Patient to follow in clinic as needed for symptoms with  Dr. Fuller Plan or myself.  Ellouise Newer, PA-C [facility] 11/02/2017, 10:48 AM  Cc: No ref. provider found

## 2017-11-02 NOTE — Telephone Encounter (Signed)
See results note. 

## 2017-11-02 NOTE — Telephone Encounter (Signed)
-----   Message from [place], [place] sent at 11/02/2017 11:22 AM EDT ----- Regarding: Please call patient and check on her Please call and make sure patient with no increasing abdominal pain or nausea/vomiting. Thanks-JLL

## 2017-11-02 NOTE — Progress Notes (Signed)
Reviewed and agree with initial management plan.  Wynee Matarazzo T. Meaghann Choo, MD FACG 

## 2017-11-02 NOTE — Patient Instructions (Signed)
Your provider has requested that you go to the basement level for lab work before leaving today. Press "B" on the elevator. The lab is located at the first door on the left as you exit the elevator.  We have sent the following medications to your pharmacy for you to pick up at your convenience:  Promethazine suppositories 25 mg every 4-6 hours

## 2017-11-02 NOTE — Telephone Encounter (Signed)
Left message on machine to call back  

## 2017-11-03 ENCOUNTER — Telehealth: Payer: Self-pay | Admitting: Physician Assistant

## 2017-11-03 ENCOUNTER — Ambulatory Visit (INDEPENDENT_AMBULATORY_CARE_PROVIDER_SITE_OTHER): Payer: Managed Care, Other (non HMO)

## 2017-11-03 DIAGNOSIS — N2889 Other specified disorders of kidney and ureter: Secondary | ICD-10-CM

## 2017-11-03 DIAGNOSIS — D259 Leiomyoma of uterus, unspecified: Secondary | ICD-10-CM

## 2017-11-03 DIAGNOSIS — R194 Change in bowel habit: Secondary | ICD-10-CM

## 2017-11-03 DIAGNOSIS — R112 Nausea with vomiting, unspecified: Secondary | ICD-10-CM

## 2017-11-03 MED ORDER — IOPAMIDOL (ISOVUE-370) INJECTION 76%
100.0000 mL | Freq: Once | INTRAVENOUS | Status: AC | PRN
  Administered 2017-11-03: 100 mL via INTRAVENOUS

## 2017-11-03 NOTE — Telephone Encounter (Signed)
The pt has been advised we will contact her as soon as the CT has resulted.

## 2017-11-10 ENCOUNTER — Encounter: Payer: Self-pay | Admitting: Psychology

## 2017-11-16 ENCOUNTER — Telehealth: Payer: Self-pay | Admitting: Physician Assistant

## 2017-11-16 NOTE — Telephone Encounter (Signed)
Patient states she is still having the same nausea and diarrhea gi symptoms for the past two weeks. Patient wants to know what next steps could be.

## 2017-11-16 NOTE — Telephone Encounter (Signed)
Notes recorded by Levin Erp, PA on 11/03/2017 at 4:20 PM EDT No acute pancreatitis, if patient is feeling better, she can slowly advance diet. Also, incidental finding of left renal lesion, suspicious for renal cell carcinoma. Pt does not have PCP listed, please refer her to urology. Thanks-JLL  The pt has seen [facility] and note will be faxed to our office for review.  She also states she continues to have nausea and diarrhea.  Uses phenergan suppositories as needed with relief.  She was scheduled to see Dr Fuller Plan on 8/22 first available appt.  She will call back if her symptoms worsen or change.

## 2017-11-24 ENCOUNTER — Telehealth: Payer: Self-pay | Admitting: Physician Assistant

## 2017-11-24 NOTE — Telephone Encounter (Signed)
The pt has been advised to continue req's for nausea and go to the ED if her symptoms worsen.  She is using phenergan as directed.  She has an appt in August and will keep that as scheduled.

## 2017-11-24 NOTE — Telephone Encounter (Signed)
Patient states she is continuing to have nausea and bowel problems and would like to know what to do until appt on 8.22.19 with Dr.Stark.

## 2017-11-25 ENCOUNTER — Telehealth: Payer: Self-pay | Admitting: Gastroenterology

## 2017-11-25 NOTE — Telephone Encounter (Signed)
The pt called to say she is going to the ED for eval of diarrhea and abd pain.

## 2017-11-29 ENCOUNTER — Telehealth: Payer: Self-pay | Admitting: Internal Medicine

## 2017-11-29 NOTE — Telephone Encounter (Signed)
Called and spoke with pt who states she will be receiving supply from pt assistance of her Dulera 7-10 business days from this past Friday, 6/14.   Pt states she has been out of her [facility] for about 4 days now and is wanting to know if she should have [place] refill her [place] while she waits for the supply to come in from Pt Assistance program or if it is fine for her to continue waiting until then.  Dr. Annamaria Boots, please advise.

## 2017-11-29 NOTE — Telephone Encounter (Signed)
Called pt and stated to her the information per CY.  Pt expressed understanding. Nothing further needed.

## 2017-11-29 NOTE — Telephone Encounter (Signed)
If she feels stable now, it is ok for her to wait for [facility] to arrive. If we have a sample of either Dulera strength, we can give it to her.

## 2017-12-07 ENCOUNTER — Other Ambulatory Visit: Payer: Self-pay | Admitting: Internal Medicine

## 2017-12-16 ENCOUNTER — Telehealth: Payer: Self-pay | Admitting: Internal Medicine

## 2017-12-16 MED ORDER — IPRATROPIUM-ALBUTEROL 20-100 MCG/ACT IN AERS: INHALATION_SPRAY | RESPIRATORY_TRACT | 3 refills | Status: DC

## 2017-12-16 NOTE — Telephone Encounter (Signed)
Rx sent to pt's preferred pharmacy.  Called pt letting her know this was taken care of for her.  Pt expressed understanding. Nothing further needed.

## 2018-01-04 ENCOUNTER — Other Ambulatory Visit: Payer: Self-pay | Admitting: Neurology

## 2018-01-04 MED ORDER — METHYLPHENIDATE HCL 10 MG PO TABS: ORAL_TABLET | ORAL | 0 refills | Status: DC

## 2018-01-04 NOTE — Telephone Encounter (Signed)
Pt requesting a refill for methylphenidate (RITALIN) 10 MG tablet sent to [facility]

## 2018-01-08 ENCOUNTER — Other Ambulatory Visit: Payer: Self-pay | Admitting: Physician Assistant

## 2018-01-09 ENCOUNTER — Telehealth: Payer: Self-pay | Admitting: Internal Medicine

## 2018-01-09 NOTE — Telephone Encounter (Signed)
Spoke with patient. She stated that for the past 2 weeks, her legs have been feeling very heavy and fatigued. She denied any SOB, chest pain or cough. She is not able to walk around in stores as much as she used to. She said that CY had mentioned to her in the past about this possibly happening. She wants to see what he would suggest for this.   CY, please advise. Thanks!

## 2018-01-09 NOTE — Telephone Encounter (Signed)
Spoke with patient. She stated that she will follow up with her PCP. Nothing further needed at time of call.

## 2018-01-09 NOTE — Telephone Encounter (Signed)
This is something for her to discuss with her primary care physician. It could be a problem with muscle weakness, circulation or something else.

## 2018-01-17 ENCOUNTER — Other Ambulatory Visit: Payer: Self-pay | Admitting: Gastroenterology

## 2018-01-20 ENCOUNTER — Other Ambulatory Visit: Payer: Self-pay | Admitting: Internal Medicine

## 2018-01-28 ENCOUNTER — Other Ambulatory Visit: Payer: Self-pay | Admitting: Internal Medicine

## 2018-02-02 ENCOUNTER — Ambulatory Visit: Payer: Managed Care, Other (non HMO) | Admitting: Gastroenterology

## 2018-02-06 ENCOUNTER — Telehealth: Payer: Self-pay | Admitting: Internal Medicine

## 2018-02-06 MED ORDER — FLUCONAZOLE 150 MG PO TABS: 150.0000 mg | ORAL_TABLET | Freq: Every day | ORAL | 0 refills | Status: DC

## 2018-02-06 NOTE — Telephone Encounter (Signed)
Called and spoke with patient, she states that she is wanting a refill of her Diflucan to have on hand in case she is ever in need.    CY please advise thank you. Patient was last seen in September of 2018.   Current Outpatient Medications on File Prior to Visit  Medication Sig Dispense Refill  . albuterol (PROVENTIL HFA;VENTOLIN HFA) 108 (90 Base) MCG/ACT inhaler Inhale 2 puffs into the lungs every 6 (six) hours as needed for wheezing or shortness of breath. 1 Inhaler 6  . clidinium-chlordiazePOXIDE (LIBRAX) 5-2.5 MG capsule TAKE 1 CAPSULE 3 TIMES DAILY AS NEEDED 90 capsule 3  . clopidogrel (PLAVIX) 75 MG tablet TAKE 1 TABLET (75 MG TOTAL) BY MOUTH DAILY. 30 tablet 10  . donepezil (ARICEPT) 5 MG tablet Take 1 tablet (5 mg total) by mouth at bedtime. 30 tablet 11  . fluconazole (DIFLUCAN) 150 MG tablet Take 1 tablet (150 mg total) by mouth daily. 7 tablet 0  . FLUoxetine (PROZAC) 10 MG tablet Take 1 tablet (10 mg total) by mouth daily. 90 tablet 1  . Folic Acid-Vit Z6-SAY T01 (FOLBEE) 2.5-25-1 MG TABS tablet Take 1 tablet by mouth daily. 90 tablet 3  . hyoscyamine (LEVSIN SL) 0.125 MG SL tablet DISSOVE 1 TABLET UNDER TONGUE EVERY 4 (FOUR) HOURS AS NEEDED FOR CRAMPING. 120 tablet 5  . Ipratropium-Albuterol (COMBIVENT RESPIMAT) 20-100 MCG/ACT AERS respimat INHALE 1 PUFF INTO THE LUNGS 2 (TWO) TIMES DAILY. 1 Inhaler 3  . methylphenidate (RITALIN) 10 MG tablet Take one pill po tid 90 tablet 0  . mometasone-formoterol (DULERA) 100-5 MCG/ACT AERO Inhale 2 puffs into the lungs 2 (two) times daily.    . pantoprazole (PROTONIX) 40 MG tablet TAKE 1 TABLET (40 MG TOTAL) BY MOUTH 2 (TWO) TIMES DAILY. 60 tablet 0  . predniSONE (DELTASONE) 10 MG tablet TAKE 1 TABLET (10 MG TOTAL) BY MOUTH DAILY WITH BREAKFAST. 30 tablet 6  . promethazine (PHENERGAN) 25 MG suppository Place 1 suppository (25 mg total) rectally every 6 (six) hours as needed for nausea or vomiting. 15 each 1  . Spacer/Aero-Holding Chambers  (AEROCHAMBER MV) inhaler Use as instructed 1 each 0   No current facility-administered medications on file prior to visit.    Allergies  Allergen Reactions  . Aspirin Shortness Of Breath

## 2018-02-06 NOTE — Telephone Encounter (Signed)
I have called to let the patient know that this medication was okay to refill and I have sent  it into the preferred pharmacy.

## 2018-02-06 NOTE — Telephone Encounter (Signed)
Ok to refill 

## 2018-03-01 ENCOUNTER — Other Ambulatory Visit: Payer: Self-pay | Admitting: Physician Assistant

## 2018-03-03 ENCOUNTER — Other Ambulatory Visit: Payer: Self-pay | Admitting: Neurology

## 2018-03-03 ENCOUNTER — Other Ambulatory Visit: Payer: Self-pay | Admitting: Internal Medicine

## 2018-03-03 MED ORDER — METHYLPHENIDATE HCL 10 MG PO TABS: ORAL_TABLET | ORAL | 0 refills | Status: DC

## 2018-03-03 NOTE — Telephone Encounter (Signed)
Pt request refill for methylphenidate (RITALIN) 10 MG tablet [facility] to [id number] [place], [place] - [address]

## 2018-03-03 NOTE — Telephone Encounter (Signed)
[place] narcotic registry checked without any issues noted.  Rx due.  Refill sent to MD for approval.

## 2018-03-06 ENCOUNTER — Telehealth: Payer: Self-pay | Admitting: Internal Medicine

## 2018-03-06 NOTE — Telephone Encounter (Signed)
Pt is calling back [phone]

## 2018-03-06 NOTE — Telephone Encounter (Signed)
[place] states that the combivent Respimat cost $200.00. She can not afford this and would like an alternative medication that would be cheaper.  Has tried to apply for patient assistants   and has been dined.   Dr. Annamaria Boots please advise.

## 2018-03-06 NOTE — Telephone Encounter (Signed)
Recommend dc Combivent  And replace with  Albuterol hfa inhaler     #1, ref x 12, inhale 2 puffs every 6 hours as needed

## 2018-03-06 NOTE — Telephone Encounter (Signed)
lmtcb for pt. Will send in rx after speaking to pt.

## 2018-03-07 ENCOUNTER — Telehealth: Payer: Self-pay | Admitting: Internal Medicine

## 2018-03-07 MED ORDER — ALBUTEROL SULFATE HFA 108 (90 BASE) MCG/ACT IN AERS: 2.0000 | INHALATION_SPRAY | Freq: Four times a day (QID) | RESPIRATORY_TRACT | 6 refills | Status: DC | PRN

## 2018-03-07 NOTE — Telephone Encounter (Signed)
Patient aware and script sent nothing further needed at this time.

## 2018-03-07 NOTE — Telephone Encounter (Signed)
Ok to see an NP if that scheduling works better.

## 2018-03-07 NOTE — Telephone Encounter (Signed)
Called and spoke to patient. Patient had already spoken to another staff member and has an appointment with CY on 03/30/18 at 2:45. Nothing further needed.

## 2018-03-07 NOTE — Telephone Encounter (Signed)
Patient cancelled appointment on 9/26 and still needs to be seen for a yearly follow up. CY next available is 12.24.19. CY please advise if patient needs to wait and see you or if they can see an NP. Thank you.

## 2018-03-09 ENCOUNTER — Ambulatory Visit: Payer: Managed Care, Other (non HMO) | Admitting: Internal Medicine

## 2018-03-16 ENCOUNTER — Other Ambulatory Visit: Payer: Self-pay

## 2018-03-16 ENCOUNTER — Encounter: Payer: Self-pay | Admitting: Neurology

## 2018-03-16 ENCOUNTER — Ambulatory Visit (INDEPENDENT_AMBULATORY_CARE_PROVIDER_SITE_OTHER): Payer: Medicare HMO | Admitting: Neurology

## 2018-03-16 ENCOUNTER — Telehealth: Payer: Self-pay | Admitting: Internal Medicine

## 2018-03-16 VITALS — BP 146/89 | HR 99 | Resp 18 | Ht 69.0 in | Wt 166.5 lb

## 2018-03-16 DIAGNOSIS — R9082 White matter disease, unspecified: Secondary | ICD-10-CM

## 2018-03-16 DIAGNOSIS — R413 Other amnesia: Secondary | ICD-10-CM | POA: Diagnosis not present

## 2018-03-16 DIAGNOSIS — F418 Other specified anxiety disorders: Secondary | ICD-10-CM | POA: Diagnosis not present

## 2018-03-16 DIAGNOSIS — R41840 Attention and concentration deficit: Secondary | ICD-10-CM

## 2018-03-16 MED ORDER — METHYLPHENIDATE HCL 10 MG PO TABS: ORAL_TABLET | ORAL | 0 refills | Status: DC

## 2018-03-16 MED ORDER — ALBUTEROL SULFATE HFA 108 (90 BASE) MCG/ACT IN AERS: 2.0000 | INHALATION_SPRAY | Freq: Four times a day (QID) | RESPIRATORY_TRACT | 6 refills | Status: DC | PRN

## 2018-03-16 NOTE — Telephone Encounter (Signed)
Called [employer] prior auth line and spoke with Aaron Edelman to clarify that the proventil inhaler is the med they were talking about.  Aaron Edelman stated that that was correct. I sent a new Rx and specified in there to make sure to have the inhaler as Ventolin since that is the one that is covered by pt's insurance.  Nothing further needed.

## 2018-03-16 NOTE — Progress Notes (Signed)
[facility]  PATIENT: [name] [name] DOB: 14-Nov-1950  REFERRING DOCTOR OR PCP:  Ricke Hey SOURCE: patients and records form [facility] records  _________________________________   HISTORICAL  CHIEF COMPLAINT:  Chief Complaint  Patient presents with  . Memory Loss    Sts. she stopped Prozac because it made her sleepy.  Sts. she does not remember discussing Aricept for memory--never picked rx. up front the pharmacy. Would like rx. sent to [facility] again and she will try this.  Sts. continues Plavix and Folbee as rx'd/fim  . Anxiety    HISTORY OF PRESENT ILLNESS:  [name] [name] is a 67 y.o. woman who reporting memory decline.   Update 03/16/2018: She feels her memory is doing the same.   She notes problems with remembering what's been said in conversation.   She feels she does ok with names. She sometimes repeats herself.    She remains on Ritalin and thinks it may help her mildly.   She never started the donepezil prescribed at the last visit  She has had some depression but stopped Prozac as she wondered if she needed the med for a while. However, as she reported feeling sad,  it was rewritten for her and she is taking 1/2 pill daily.    She is taking clopidogrel and Folbee and tolerates them well.   She has moderately severe chronic microvascular ischemic changes on CT.      She was having back pain radiating down her leg and was given a dosepak and is scheduled for MRI.    Update 09/14/2017:     She feels her short-term memory is doing about the same.  It has not worsened since the last visit.  She notes that when she forgets,  she usually remembers if she gets a hint.  She sometimes repeats herself.   She forgets where things are placed in the house.    She is not noting any difficulty driving. Ritalin seems to help her energy but she is not sure it helps memory much .    She tolerates it well and there have not been any complications.       Mood is doing  about the same as the last visit. She feels down at times.  She gets frustrated easily, especially if she forgets something.  Fluoxetine may be helping a little bit.  There are no suicidal or homicidal ideations.  Although Ritalin helped her focus, it did not change the mood.  She sleeps well most nights getting about 6 hours.   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    She feels she is about the sane as last year or slightly worse but not much worse.    She does not get lost while driving and remembers to turn off appliances.  She doesn't always remember what she has just talked about in conversation.   This frustrates her when it occurs at work.    She usually feels that with hints she remembers what was forgotten.    She felt Ritalin helped her focus and attention.   She tolerates it well.  She feels she has a mild depression and sometimes gets frustrated easily..   She is on fluoxetine.    She sleeps well most nights getting about 6 hours of sleep.   She will usually just take Ritalin twice a day because the third one  sometimes caused insomnia.  ______________________________- From 02/08/2016 Short term memory issues are her biggest concern.    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  E doesn't remember to make lists most days.  She uses some reminders Holiday representative notes)   She denies any difficulty doing math.  She has not become lost while driving. She has not had difficulty remembering to turn appliances off.   Attention and focus are poor.    Ritalin and feels it helped her focus but not her memory.      Her mother was in her 67s when she had Alzheimer's disease. Her sister, brother and aunt all had strokes.     CT scan 12/10/2014 showed moderate to severe periventricular subcortical white matter changes consistent with chronic microvascular ischemia.    Mood:   She has had depression and  gets frustrated easily.  She feels her husband hasn't been responsive enough to her problems.   She gets frustrated easily, especially at her husband.   She stopped escitalopram and had more mood swings.    Sertraline made her sleepy   Sleep:    She feels that she falls asleep and stays asleep well. Husband has not noted her snoring or gasping for air at night.   REVIEW OF SYSTEMS: Constitutional: No fevers, chills, sweats, or change in appetite.    Some fatigue Eyes: No visual changes, double vision, eye pain Ear, nose and throat: No hearing loss, ear pain, nasal congestion, sore throat Cardiovascular: No chest pain, palpitations Respiratory: reports shortness of breath and wheezes   No snores GastrointestinaI: No nausea, vomiting, diarrhea, abdominal pain, fecal incontinence Genitourinary: No dysuria, urinary retention or frequency.  No nocturia. Musculoskeletal: No neck pain, back pain but notes pain in the hips and knees. . Integumentary: No rash, pruritus, skin lesions Neurological: as above Psychiatric: Notes depression at this time but no anxiety Endocrine: No palpitations, diaphoresis, change in appetite, change in weigh or increased thirst Hematologic/Lymphatic: No anemia, purpura, petechiae. Allergic/Immunologic: No itchy/runny eyes, nasal congestion,   She has some seasonal allergies.  ALLERGIES: Allergies  Allergen Reactions  . Aspirin Shortness Of Breath    HOME MEDICATIONS:  Current Outpatient Medications:  .  clopidogrel (PLAVIX) 75 MG tablet, TAKE 1 TABLET (75 MG TOTAL) BY MOUTH DAILY., Disp: 30 tablet, Rfl: 10 .  Folic Acid-Vit W1-XBJ Y78 (FOLBEE) 2.5-25-1 MG TABS tablet, Take 1 tablet by mouth daily., Disp: 90 tablet, Rfl: 3 .  hyoscyamine (LEVSIN SL) 0.125 MG SL tablet, DISSOVE 1 TABLET UNDER TONGUE EVERY 4 (FOUR) HOURS AS NEEDED FOR CRAMPING., Disp: 120 tablet, Rfl: 5 .  Ipratropium-Albuterol (COMBIVENT RESPIMAT) 20-100 MCG/ACT AERS respimat, INHALE 1 PUFF INTO  THE LUNGS 2 (TWO) TIMES DAILY., Disp: 1 Inhaler, Rfl: 3 .  methylphenidate (RITALIN) 10 MG tablet, Take one pill po tid, Disp: 90 tablet, Rfl: 0 .  mometasone-formoterol (DULERA) 100-5 MCG/ACT AERO, Inhale 2 puffs into the lungs 2 (two) times daily., Disp: , Rfl:  .  pantoprazole (PROTONIX) 40 MG tablet, TAKE 1 TABLET (40 MG TOTAL) BY MOUTH 2 (TWO) TIMES DAILY., Disp: 60 tablet, Rfl: 5 .  predniSONE (DELTASONE) 10 MG tablet, TAKE 1 TABLET (10 MG TOTAL) BY MOUTH DAILY WITH BREAKFAST., Disp: 30 tablet, Rfl: 6 .  Spacer/Aero-Holding Chambers (AEROCHAMBER MV) inhaler, Use as instructed, Disp: 1 each, Rfl: 0 .  albuterol (PROVENTIL HFA;VENTOLIN HFA) 108 (90 Base) MCG/ACT inhaler, Inhale 2 puffs into the lungs every 6 (six) hours  as needed for wheezing or shortness of breath., Disp: 1 Inhaler, Rfl: 6 .  clidinium-chlordiazePOXIDE (LIBRAX) 5-2.5 MG capsule, TAKE 1 CAPSULE 3 TIMES DAILY AS NEEDED, Disp: 90 capsule, Rfl: 3  PAST MEDICAL HISTORY: Past Medical History:  Diagnosis Date  . Allergic rhinitis, cause unspecified    failed allergy vaccine  . Allergy   . Anxiety    panic attacks  . Arthritis   . COPD (chronic obstructive pulmonary disease) ([facility])   . Depression   . Diverticulosis   . Extrinsic asthma, unspecified   . GERD (gastroesophageal reflux disease)   . Hiatal hernia   . Polyp of nasal cavity   . PONV (postoperative nausea and vomiting)   . S/P dilatation of esophageal stricture 08-2008  . 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 Unspecified sinusitis (chronic)   . Vision abnormalities     PAST SURGICAL HISTORY: Past Surgical History:  Procedure Laterality Date  . BUNIONECTOMY     left foot  . CHOLECYSTECTOMY  05/05/2012   Procedure: LAPAROSCOPIC CHOLECYSTECTOMY WITH INTRAOPERATIVE CHOLANGIOGRAM;  Surgeon: Gayland Curry, MD,FACS;  Location: [facility];  Service: General;  Laterality: N/A;  . COLONOSCOPY    . COLONOSCOPY W/ BIOPSIES AND POLYPECTOMY    . NASAL POLYP SURGERY      x 3   . POLYPECTOMY    . THUMB FUSION  10/13   rt hand  pinned but pin removed  . TONSILLECTOMY      FAMILY HISTORY: Family History  Problem Relation Age of Onset  . Colon cancer Maternal Grandfather        unsure age of onset   . Cancer Maternal Grandfather        colon  . Stroke Maternal Aunt   . Heart disease Father        Vague history  . Prostate cancer Brother   . Ataxia Daughter        died at age 103.5    SOCIAL HISTORY:  Social History   Socioeconomic History  . Marital status: Married    Spouse name: Not on file  . Number of children: 2  . Years of education: Not on file  . Highest education level: Not on file  Occupational History  . Occupation: Scientist, research (medical): [employer]  Social Needs  . Financial resource strain: Not on file  . Food insecurity:    Worry: Not on file    Inability: Not on file  . Transportation needs:    Medical: Not on file    Non-medical: Not on file  Tobacco Use  . Smoking status: Former Smoker    Packs/day: 0.50    Years: 0.00    Pack years: 0.00    Types: Cigarettes    Last attempt to quit: 06/14/1988    Years since quitting: 29.7  . Smokeless tobacco: Never Used  Substance and Sexual Activity  . Alcohol use: No  . Drug use: No  . Sexual activity: Yes    Birth control/protection: Post-menopausal  Lifestyle  . Physical activity:    Days per week: Not on file    Minutes per session: Not on file  . Stress: Not on file  Relationships  . Social connections:    Talks on phone: Not on file    Gets together: Not on file    Attends religious service: Not on file    Active member of club or organization: Not on file    Attends meetings  of clubs or organizations: Not on file    Relationship status: Not on file  . Intimate partner violence:    Fear of current or ex partner: Not on file    Emotionally abused: Not on file    Physically abused: Not on file    Forced sexual activity: Not on file    Other Topics Concern  . Not on file  Social History Narrative   Lives with husband.       PHYSICAL EXAM  Vitals:   03/16/18 1503  BP: (!) 146/89  Pulse: 99  Resp: 18  Weight: 166 lb 8 oz (75.5 kg)  Height: 5\' 9"  (1.753 m)    Body mass index is 24.59 kg/m.   General: The patient is well-developed and well-nourished and in no acute distress   Neurologic Exam  Mental status: The patient is alert and oriented x 3 at the time of the examination. The patient has good recent (3/3 without prompt) and good remote memory, with a slightly reduced  attention span and concentration ability (100-93-86-75-63);   [facility]).     Speech is normal.     Cranial nerves: Extraocular movements are full.  Facial strength and sensation are normal.   Trapezius strength is normal.    The tongue is midline, and the patient has symmetric elevation of the soft palate.    Motor:  Muscle bulk is normal.   Tone is normal. Strength is  5 / 5 in all 4 extremities.   Sensory: Sensory testing is intact to touch,.   Coordination: Cerebellar testing reveals good finger-nose-finger and heel-to-shin bilaterally.  Gait and station: Station is normal.   Gait is normal for age . Tandem gait is mildly wide.   Marland Kitchen   DTRs are normal and symmetric.   Marland Kitchen        DIAGNOSTIC DATA (LABS, IMAGING, TESTING) - I reviewed patient records, labs, notes, testing and imaging myself where available.  Lab Results  Component Value Date   WBC 11.5 (H) 11/02/2017   HGB 9.6 (L) 11/02/2017   HCT 30.4 (L) 11/02/2017   MCV 70.4 (L) 11/02/2017   PLT 439.0 (H) 11/02/2017      Component Value Date/Time   NA 143 11/02/2017 1117   NA 143 10/02/2014 1548   K 3.8 11/02/2017 1117   CL 104 11/02/2017 1117   CO2 31 11/02/2017 1117   GLUCOSE 95 11/02/2017 1117   BUN 21 11/02/2017 1117   BUN 14 10/02/2014 1548   CREATININE 0.97 11/02/2017 1117   CALCIUM 9.1 11/02/2017 1117   PROT 7.0 11/02/2017 1117   PROT 6.6 10/02/2014 1548    ALBUMIN 4.1 11/02/2017 1117   ALBUMIN 4.3 10/02/2014 1548   AST 22 11/02/2017 1117   ALT 18 11/02/2017 1117   ALKPHOS 93 11/02/2017 1117   BILITOT 0.4 11/02/2017 1117   BILITOT <0.2 10/02/2014 1548   GFRNONAA 59 (L) 10/02/2014 1548   GFRAA 68 10/02/2014 1548   Lab Results  Component Value Date   CHOL 228 (H) 09/01/2012   HDL 60.00 09/01/2012   LDLDIRECT 146.4 09/01/2012   TRIG 167.0 (H) 09/01/2012   CHOLHDL 4 09/01/2012   Lab Results  Component Value Date   HGBA1C 5.7 09/01/2012   Lab Results  Component Value Date   VITAMINB12 507 10/02/2014   Lab Results  Component Value Date   TSH 1.210 10/02/2014       ASSESSMENT AND PLAN  Memory difficulties  Attention deficit  Depression with anxiety  White matter abnormality on MRI of brain    1.   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   She actually did better today than 6 months ago.   Continue med's.  2.   Continue Prozac to 10 mg daily for depression    Continue Ritalin 10 mg by mouth.  Can take up to 3 times a day.  3.   Continue Clopidogrel 75 mg po daily and Folbee for vascular changes. 4.   RTC 6 months or as needed for new or worsening neurologic symptoms.   [place] Lebeau A. Felecia Shelling, MD, PhD 07/21/7410, 8:78 PM Certified in Neurology, Clinical Neurophysiology, Sleep Medicine, Pain Medicine and Neuroimaging  [facility] [address], [place], [place] [address] [phone]

## 2018-03-29 ENCOUNTER — Other Ambulatory Visit: Payer: Self-pay | Admitting: Internal Medicine

## 2018-03-30 ENCOUNTER — Ambulatory Visit: Payer: Medicare HMO | Admitting: Internal Medicine

## 2018-03-30 ENCOUNTER — Encounter: Payer: Self-pay | Admitting: Internal Medicine

## 2018-03-30 ENCOUNTER — Ambulatory Visit (INDEPENDENT_AMBULATORY_CARE_PROVIDER_SITE_OTHER)
Admission: RE | Admit: 2018-03-30 | Discharge: 2018-03-30 | Disposition: A | Discharge status: Home / Self Care | Payer: Medicare HMO | Source: Ambulatory Visit | Attending: Internal Medicine | Admitting: Internal Medicine

## 2018-03-30 VITALS — BP 124/80 | HR 110 | Ht 69.0 in | Wt 171.6 lb

## 2018-03-30 DIAGNOSIS — Z23 Encounter for immunization: Secondary | ICD-10-CM | POA: Diagnosis not present

## 2018-03-30 DIAGNOSIS — J449 Chronic obstructive pulmonary disease, unspecified: Secondary | ICD-10-CM

## 2018-03-30 DIAGNOSIS — K219 Gastro-esophageal reflux disease without esophagitis: Secondary | ICD-10-CM

## 2018-03-30 NOTE — Patient Instructions (Signed)
Order- pneumovax 23  Order- CXR  Dx COPD mixed type  Order- schedule PFT    Dx COPD mixed type  Please call as needed

## 2018-03-30 NOTE — Progress Notes (Signed)
Subjective:    Patient ID: [name] [name], female    DOB: [birth year], 67 y.o.   MRN: [id number]  HPI  female former smoker followed for asthma/COPD, allergic rhinitis/history nasal polyps/aspirin allergy, recurrent sinusitis, complicated by GERD Office Spirometry 06/01/12- moderate obstructive airways disease. FVC 3.15/85%, FEV1 1.87/65%, FEV1/FVC 0.59% FEF 25-75% 0.92/36% PFT 02/01/08- we compared her old PFT-mild obstructive airways disease with minimal response to bronchodilator. FVC 4.14/107%, FEV1 2.83/98%, FEV1/FVC 0.68, FEF 25-75% 1.72/56%. Scores have declined. PFT 07/20/2012-moderate obstructive airways disease with response to bronchodilator, normal lung volumes, diffusion mildly reduced. FVC 3.87/107%, FEV1 2.37/88%, FEV1/FVC 0.61, FEF 25-75% 1.12/39%. TLC 104%, DLCO 71%. 6MWT 07/20/2012-98%, 97%, 98%, 444 m. Noted hip pain. No oxygen limitation. Echocardiogram 08/03/2012-EF 29-93%, gr 1 diastolic dysfunction. No pulmonary hypertension or wall motion abnormality FeNO-12/18/15- 13  -----------------------------------------------------------------------------------------------------------------   03/10/17- 67 year old female former smoker followed for asthma/COPD, allergic rhinitis/history nasal polyps/aspirin allergy, recurrent sinusitis, complicated by GERD 1 yr f/u for asthma. States asthma has been ok, but has been having COPD flare ups. Dry mouth.  Albuterol HFA, CombiventRespimat, Dulera 100, prednisone 10 mg daily Bothered by dry mouth and soreness in the corners of her mouth over the last month or 2. Says her breathing is "okay" with no recent exacerbation and only occasional use of rescue inhaler. Breathing does not wake her. Heartburn only if she is careless about what she eats. We discussed her maintenance prednisone. She had wanted this for airway stability not provided by inhaled steroids. She no longer has a frame of reference and isn't sure how much this helps but  understands that she is now dependent upon it.  03/30/18- 67 year old female former smoker followed for asthma/COPD, allergic rhinitis/history nasal polyps/aspirin allergy, recurrent sinusitis, complicated by GERD -----Moderate COPD mixed type-no wheezing, gerd symptoms Combivent Respimat, prednisone 10 mg daily, Symbicort 160, albuterol HFA,  She has realized that a tight feeling in her chest she was attributing to asthma, is likely related to her GERD and is relieved by hyoscyamine and Librax.  Has a little cough or wheeze now. Currently using Symbicort maintenance and either Combivent or albuterol rescue inhaler for occasional use, depending on price.  Has a coupon for Combivent.  ROS-see HPI   + = positive Constitutional:    weight loss, night sweats, fevers, chills, fatigue, lassitude. HEENT:    headaches, difficulty swallowing, tooth/dental problems, sore throat,       sneezing, itching, ear ache, +nasal congestion, post nasal drip, snoring CV:    chest pain, orthopnea, PND, swelling in lower extremities, anasarca,                                                     dizziness, palpitations Resp:   shortness of breath with exertion or at rest.                productive cough,   non-productive cough, coughing up of blood.              change in color of mucus.  wheezing.   Skin:    rash or lesions. GI:  No-   heartburn, indigestion, abdominal pain, nausea, vomiting,  GU:  MS:   joint pain, stiffness,  Neuro-     nothing unusual Psych:  change in mood or affect.  depression or anxiety.   memory loss.  OBJ- Physical Exam General- Alert, Oriented, Affect-appropriate, Distress- none acute Skin- rash-none, lesions- none, excoriation- none Lymphadenopathy- none Head- atraumatic            Eyes- Gross vision intact, PERRLA, conjunctivae and secretions clear            Ears- Hearing, canals-normal            Nose- not- stuffy, no-Septal dev, mucus, polyps, erosion, perforation              Throat- Mallampati III , mucosa clear/not dry , drainage- none, tonsils- atrophic,                          , + dentures, Neck- flexible , trachea midline, no stridor , thyroid nl, carotid no bruit Chest - symmetrical excursion , unlabored           Heart/CV- RRR , no murmur , no gallop  , no rub, nl s1 s2                           - JVD- none , edema- none, stasis changes- none, varices- none           Lung- clear to P&A, wheeze- none, cough +, dullness-none, rub- none           Chest wall-  Abd-  Br/ Gen/ Rectal- Not done, not indicated Extrem- cyanosis- none, clubbing, none, atrophy- none, strength- nl Neuro- grossly intact to observation

## 2018-03-30 NOTE — Assessment & Plan Note (Signed)
She has recognized that hyoscyamine and Librax relieve substernal tightness, which is most likely related to her GERD/esophageal spasm. Plan-careful reflux precautions.  See PCP or GI for management help.

## 2018-03-30 NOTE — Assessment & Plan Note (Signed)
We are going to update evaluation with PFT and CXR.  Given Pneumovax today.  Has had flu shot.  Okay to use Combivent or albuterol based on price, but she understands not to use both at the same time.

## 2018-04-10 ENCOUNTER — Encounter: Payer: Self-pay | Admitting: Psychology

## 2018-04-10 ENCOUNTER — Ambulatory Visit (INDEPENDENT_AMBULATORY_CARE_PROVIDER_SITE_OTHER): Payer: Medicare HMO | Admitting: Psychology

## 2018-04-10 ENCOUNTER — Ambulatory Visit: Payer: Medicare HMO | Admitting: Psychology

## 2018-04-10 DIAGNOSIS — R413 Other amnesia: Secondary | ICD-10-CM
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
## 2018-04-10 NOTE — Progress Notes (Signed)
   Neuropsychology Note  [name] [name] completed 60 minutes of neuropsychological testing with technician, Milana Kidney, BS, under the supervision of Dr. Macarthur Critchley, Licensed Psychologist. The patient did not appear overtly distressed by the testing session, per behavioral observation or via self-report to the technician. Rest breaks were offered.   Clinical Decision Making: In considering the patient's current level of functioning, level of presumed impairment, nature of symptoms, emotional and behavioral responses during the interview, level of literacy, and observed level of motivation/effort, a battery of tests was selected and communicated to the psychometrician.  Communication between the psychologist and technician was ongoing throughout the testing session and changes were made as deemed necessary based on patient performance on testing, technician observations and additional pertinent factors such as those listed above.  [name] will return within approximately 2 weeks for an interactive feedback session with Dr. Si Raider at which time her test performances, clinical impressions and treatment recommendations will be reviewed in detail. The patient understands she can contact our office should she require our assistance before this time.  35 minutes spent performing neuropsychological evaluation services/clinical decision making (psychologist). [CPT 33825] 60 minutes spent face-to-face with patient administering standardized tests, 30 minutes spent scoring (technician). [CPT [id number], 05397]  Full report to follow.

## 2018-04-12 ENCOUNTER — Other Ambulatory Visit: Payer: Self-pay | Admitting: Internal Medicine
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
## 2018-04-18 ENCOUNTER — Ambulatory Visit: Payer: Medicare HMO | Admitting: Psychology

## 2018-04-18 ENCOUNTER — Encounter: Payer: Self-pay | Admitting: Psychology

## 2018-04-18 DIAGNOSIS — R9082 White matter disease, unspecified: Secondary | ICD-10-CM

## 2018-04-18 DIAGNOSIS — R413 Other amnesia: Secondary | ICD-10-CM

## 2018-04-18 DIAGNOSIS — G3184 Mild cognitive impairment, so stated: Secondary | ICD-10-CM | POA: Diagnosis not present

## 2018-04-18 NOTE — Patient Instructions (Signed)
    Vascular Cognitive Impairment  Vascular cognitive impairment is a change in thinking skills as a result of conditions that block or reduce blood flow to various regions of the brain. To be healthy and function properly, brain cells need an adequate supply of oxygen and nutrients. Oxygen and nutrients are delivered to brain cells via the blood, which travels through a network of vessels called the vascular system. If the vascular system becomes damaged, brain cells cannot get the oxygen and nutrients they need and they will eventually die.   This decreased oxygen to the brain very often causes changes to the small vessels of the brain and can result in both subtle and more noticeable thinking and memory problems. These thinking difficulties can begin as mild changes that worsen gradually over time as a result of multiple small strokes or small vessel ischemic disease. Small vessel ischemic disease occurs when the small blood vessels that lead to the brain do not receive an adequate supply of blood over a relatively long period of time. Small vessel ischemic disease results from a history of multiple vascular risk factors (see below). It is also known by several other terms such as "microvascular disease" and "white matter disease". Common early signs of small vessel ischemic disease are reduced ability to pay attention, slowed information processing, difficulty finding the right words, and impaired planning and judgment.   There are a number of conditions that can damage the vascular system, including high blood pressure, high cholesterol, heart problems, and diabetes. Smoking, obesity, heavy alcohol use, and stress can also result in damage to the vascular system. All of these variables are referred to as "vascular risk factors." The more risk factors one has, the greater the likelihood that he or she will experience problems in thinking and memory.   Reducing Risk Any condition that damages blood  vessels anywhere in the body can cause brain changes linked to vascular cognitive impairment. However, the following strategies can help to protect the brain and reduce one's risk of cognitive decline:    . Don't smoke . Keep blood pressure, cholesterol and blood sugar within recommended limits . Engage in consistent, safe cardiovascular exercise . Maintain compliance with prescribed medications . Eat a healthy, balanced diet . Maintain a healthy weight . Limit alcohol consumption

## 2018-04-26 ENCOUNTER — Telehealth: Payer: Self-pay | Admitting: Internal Medicine

## 2018-04-26 NOTE — Telephone Encounter (Signed)
Spoke with pt, she states she was prescribed a medication for her GERD. She is now on Medicare and they don't cover the Librax and she has been out of the medication for 3 weeks and now having symptoms of pain and upset stomach. Is there anything else that she can use instead? CY please advise.   Current Outpatient Medications on File Prior to Visit  Medication Sig Dispense Refill  . albuterol (PROVENTIL HFA;VENTOLIN HFA) 108 (90 Base) MCG/ACT inhaler Inhale 2 puffs into the lungs every 6 (six) hours as needed for wheezing or shortness of breath. (Patient not taking: Reported on 03/30/2018) 1 Inhaler 6  . calcium-vitamin D (OSCAL WITH D) 250-125 MG-UNIT tablet Take 1 tablet by mouth daily.    . clidinium-chlordiazePOXIDE (LIBRAX) 5-2.5 MG capsule TAKE 1 CAPSULE 3 TIMES DAILY AS NEEDED 270 capsule 2  . clopidogrel (PLAVIX) 75 MG tablet TAKE 1 TABLET (75 MG TOTAL) BY MOUTH DAILY. 30 tablet 10  . COMBIVENT RESPIMAT 20-100 MCG/ACT AERS respimat TAKE 1 PUFF BY MOUTH TWICE A DAY 1 Inhaler 0  . docusate sodium (COLACE) 100 MG capsule Take 100 mg by mouth 2 (two) times daily.    Marland Kitchen escitalopram (LEXAPRO) 10 MG tablet TAKE ONE TABLET (10 MG DOSE) BY MOUTH DAILY.  3  . ferrous sulfate 325 (65 FE) MG EC tablet Take 325 mg by mouth 3 (three) times daily with meals.    . gabapentin (NEURONTIN) 300 MG capsule TAKE 1 CAPSULE BY MOUTH THREE TIMES A DAY  1  . hyoscyamine (LEVSIN SL) 0.125 MG SL tablet DISSOVE 1 TABLET UNDER TONGUE EVERY 4 (FOUR) HOURS AS NEEDED FOR CRAMPING. 120 tablet 5  . methylphenidate (RITALIN) 10 MG tablet Take one pill po tid 90 tablet 0  . mometasone-formoterol (DULERA) 100-5 MCG/ACT AERO Inhale 2 puffs into the lungs 2 (two) times daily.    . pantoprazole (PROTONIX) 40 MG tablet TAKE 1 TABLET (40 MG TOTAL) BY MOUTH 2 (TWO) TIMES DAILY. 60 tablet 5  . predniSONE (DELTASONE) 10 MG tablet TAKE 1 TABLET (10 MG TOTAL) BY MOUTH DAILY WITH BREAKFAST. 30 tablet 6  . Spacer/Aero-Holding Chambers  (AEROCHAMBER MV) inhaler Use as instructed 1 each 0   No current facility-administered medications on file prior to visit.    Allergies  Allergen Reactions  . Aspirin Shortness Of Breath

## 2018-04-26 NOTE — Telephone Encounter (Signed)
Called patient unable to reach left message to give us a call back.

## 2018-04-26 NOTE — Telephone Encounter (Signed)
Patient calling back, CB [phone].

## 2018-04-27 ENCOUNTER — Telehealth: Payer: Self-pay | Admitting: Gastroenterology

## 2018-04-27 MED ORDER — CHLORDIAZEPOXIDE HCL 5 MG PO CAPS: 5.0000 mg | ORAL_CAPSULE | Freq: Two times a day (BID) | ORAL | 0 refills | Status: DC | PRN

## 2018-04-27 NOTE — Telephone Encounter (Signed)
Called and spoke with pt letting her know that CY had said for [place] to send Rx of Librium 5mg  to her pharmacy for her and then have her PCP decide whether they want her to continue on med.  Pt expressed understanding. Called pt's pharmacy and spoke with Radidka, pharmacist and gave a verbal of pt's medication.  Nothing further needed.

## 2018-04-27 NOTE — Telephone Encounter (Signed)
Offer librium 2.5 mg, # 60      1 twice daily as needed.  Ask her to please go ahead and make an appointment for thi problem with her pcp, for when she can get it.

## 2018-04-27 NOTE — Telephone Encounter (Signed)
Ok sorry. Then give 5 mg, # 60, 1 twice daily as needed.  Her PCP will decide whether to continue it.

## 2018-04-27 NOTE — Telephone Encounter (Signed)
Does she have a primary physician who might be able to help with this?

## 2018-04-27 NOTE — Telephone Encounter (Signed)
Spoke with pt, she states she wanted to see if she could get something quicker until she can get an appt with her PCP. She states she only seen them once and she doesn't know how fast she can get an appt with them. Please advise CY.

## 2018-04-27 NOTE — Telephone Encounter (Signed)
Patient states Librax is over $400 dollars and is not covered any longer. Patient wants to know if there is another medication similar to this one. Informed patient there really isn't another medication comparable to this medicine but I can send the message to Dr. Fuller Plan to see if we can send in another medication. Informed patient Dr. Fuller Plan is out of town at the moment and I will contact her next week with a response.  Please advise.

## 2018-04-27 NOTE — Telephone Encounter (Signed)
CY Librium only come in 5, 10, and 25 mg and they are in capsule form. I couldn't find 2.5 mg. Please advise.

## 2018-04-30 NOTE — Telephone Encounter (Signed)
Her medication list includes hyoscyamine so I recommend that she takes this as follows: 1-2 q4h as needed in place of Librax. Avoid foods that trigger symptoms.

## 2018-05-01 NOTE — Telephone Encounter (Signed)
Informed patient to take her hyoscyamine 1-2  every 4 hours as needed. Patient states she has been taking it every 4 hours since she has been out of Librax. Informed patient to continue to take it every 4 hours. Patient verbalized understanding.

## 2018-05-16 ENCOUNTER — Other Ambulatory Visit: Payer: Self-pay | Admitting: Neurology

## 2018-05-16 MED ORDER — METHYLPHENIDATE HCL 10 MG PO TABS: ORAL_TABLET | ORAL | 0 refills | Status: DC

## 2018-05-16 NOTE — Addendum Note (Signed)
Addended by: Hope Pigeon on: 05/16/2018 12:21 PM   Modules accepted: Orders

## 2018-05-16 NOTE — Telephone Encounter (Signed)
Checked drug registry. She last refilled 03/03/18 #90, not receiving from other MD's. Last seen 03/16/18 and next f/u 09/20/18.

## 2018-05-16 NOTE — Telephone Encounter (Signed)
Pt is asking for a refill on her methylphenidate (RITALIN) 10 MG tablet  [id number]

## 2018-05-17 ENCOUNTER — Telehealth: Payer: Self-pay | Admitting: *Deleted

## 2018-05-17 NOTE — Telephone Encounter (Signed)
Initiated PA methylphenidate on CMM. Key: [id number]. In process of completing.

## 2018-05-17 NOTE — Telephone Encounter (Signed)
Submitted PA. Waiting on determination. °

## 2018-05-17 NOTE — Telephone Encounter (Signed)
PA approved from 06/12/17-06/13/18. Faxed noted of approval to [facility] at [phone]. Received fax confirmation.

## 2018-05-23 ENCOUNTER — Telehealth: Payer: Self-pay | Admitting: Gastroenterology

## 2018-05-24 MED ORDER — DICYCLOMINE HCL 20 MG PO TABS: 20.0000 mg | ORAL_TABLET | Freq: Four times a day (QID) | ORAL | 1 refills | Status: DC

## 2018-05-24 NOTE — Telephone Encounter (Signed)
Discussed with Dr. Fuller Plan please add dicyclomine 20 mg 1 po tid.    Patient notiied rx sent

## 2018-05-24 NOTE — Telephone Encounter (Signed)
Sorry, I neglected to tell you she is taking the levsin as well with no results.

## 2018-05-24 NOTE — Telephone Encounter (Signed)
She is having alternating bowel habits.  She is having loose stool after meals.  She is requesting an alternative to librax.

## 2018-05-24 NOTE — Telephone Encounter (Signed)
Levsin 0.125 mg 2 po ac, 1 year of refills

## 2018-06-09 ENCOUNTER — Encounter: Payer: Self-pay | Admitting: Pulmonary Disease

## 2018-06-09 ENCOUNTER — Ambulatory Visit (INDEPENDENT_AMBULATORY_CARE_PROVIDER_SITE_OTHER): Payer: Medicare HMO | Admitting: Pulmonary Disease

## 2018-06-09 ENCOUNTER — Telehealth: Payer: Self-pay | Admitting: Internal Medicine

## 2018-06-09 VITALS — BP 124/82 | HR 114 | Temp 98.2°F | Ht 69.0 in | Wt 171.4 lb

## 2018-06-09 DIAGNOSIS — J449 Chronic obstructive pulmonary disease, unspecified: Secondary | ICD-10-CM | POA: Diagnosis not present

## 2018-06-09 DIAGNOSIS — K219 Gastro-esophageal reflux disease without esophagitis: Secondary | ICD-10-CM | POA: Diagnosis not present

## 2018-06-09 MED ORDER — AZITHROMYCIN 250 MG PO TABS: ORAL_TABLET | ORAL | 0 refills | Status: DC

## 2018-06-09 MED ORDER — PREDNISONE 10 MG PO TABS: ORAL_TABLET | ORAL | 0 refills | Status: DC

## 2018-06-09 NOTE — Telephone Encounter (Addendum)
Called and spoke to pt. Pt reports of dry cough, throat clearing & wheezing when she woke up this morning. Sx developed this morning.  Pt states that her spouse has had a cold recently. Denied fever, chills or sweats.  Pt using dulera 100 2 puffs daily & combivent twice daily with no improvement.  Pt currently on 10mg  of prednisone.  Pt has been scheduled for acute visit with Aaron Edelman today at 2:00. Nothing further is needed.

## 2018-06-09 NOTE — Assessment & Plan Note (Signed)
Azithromycin 250mg  tablet  >>>Take 2 tablets (500mg  total) today, and then 1 tablet (250mg ) for the next four days  >>>take with food  >>>can also take probiotic and / or yogurt while on antibiotic   Prednisone 10mg  tablet  >>>Take 2 tablets (20 mg total) daily for the next 5 days, then resume 10mg  daily  >>> Take with food in the morning  Dulera 100 >>> 2 puffs in the morning right when you wake up, rinse out your mouth after use, 12 hours later 2 puffs, rinse after use >>> Take this daily, no matter what >>> This is not a rescue inhaler   Follow up with Dr. Annamaria Boots in 4-6 weeks to ensure symptoms resolve

## 2018-06-09 NOTE — Patient Instructions (Addendum)
Azithromycin 250mg  tablet  >>>Take 2 tablets (500mg  total) today, and then 1 tablet (250mg ) for the next four days  >>>take with food  >>>can also take probiotic and / or yogurt while on antibiotic   Prednisone 10mg  tablet  >>>Take 2 tablets (20 mg total) daily for the next 5 days, then resume 10mg  daily  >>> Take with food in the morning  Dulera 100 >>> 2 puffs in the morning right when you wake up, rinse out your mouth after use, 12 hours later 2 puffs, rinse after use >>> Take this daily, no matter what >>> This is not a rescue inhaler   Protonix 40 mg tablet  >>>Please take 1 tablet daily 15 minutes to 30 minutes before your first meal of the day as well as before your other medications >>>Try to take at the same time each day >>>take this medication daily  GERD management: >>>Avoid laying flat until 2 hours after meals >>>Elevate head of the bed including entire chest >>>Reduce size of meals and amount of fat, acid, spices, caffeine and sweets >>>If you are smoking, Please stop! >>>Decrease alcohol consumption >>>Work on maintaining a healthy weight with normal BMI    Follow up with Dr. Annamaria Boots in 4-6 weeks to ensure symptoms resolve    It is flu season:   >>>Remember to be washing your hands regularly, using hand sanitizer, be careful to use around herself with has contact with people who are sick will increase her chances of getting sick yourself. >>> Best ways to protect herself from the flu: Receive the yearly flu vaccine, practice good hand hygiene washing with soap and also using hand sanitizer when available, eat a nutritious meals, get adequate rest, hydrate appropriately   Please contact the office if your symptoms worsen or you have concerns that you are not improving.   Thank you for choosing [facility] for your healthcare, and for allowing [place] to partner with you on your healthcare journey. I am thankful to be able to provide care to you today.    [name] Quaker FNP-C      Gastroesophageal Reflux Disease, Adult Gastroesophageal reflux (GER) happens when acid from the stomach flows up into the tube that connects the mouth and the stomach (esophagus). Normally, food travels down the esophagus and stays in the stomach to be digested. With GER, food and stomach acid sometimes move back up into the esophagus. You may have a disease called gastroesophageal reflux disease (GERD) if the reflux:  Happens often.  Causes frequent or very bad symptoms.  Causes problems such as damage to the esophagus. When this happens, the esophagus becomes sore and swollen (inflamed). Over time, GERD can make small holes (ulcers) in the lining of the esophagus. What are the causes? This condition is caused by a problem with the muscle between the esophagus and the stomach. When this muscle is weak or not normal, it does not close properly to keep food and acid from coming back up from the stomach. The muscle can be weak because of:  Tobacco use.  Pregnancy.  Having a certain type of hernia (hiatal hernia).  Alcohol use.  Certain foods and drinks, such as coffee, chocolate, onions, and peppermint. What increases the risk? You are more likely to develop this condition if you:  Are overweight.  Have a disease that affects your connective tissue.  Use NSAID medicines. What are the signs or symptoms? Symptoms of this condition include:  Heartburn.  Difficult or painful swallowing.  The feeling of having a lump in the throat.  A bitter taste in the mouth.  Bad breath.  Having a lot of saliva.  Having an upset or bloated stomach.  Belching.  Chest pain. Different conditions can cause chest pain. Make sure you see your doctor if you have chest pain.  Shortness of breath or noisy breathing (wheezing).  Ongoing (chronic) cough or a cough at night.  Wearing away of the surface of teeth (tooth enamel).  Weight loss. How is this  treated? Treatment will depend on how bad your symptoms are. Your doctor may suggest:  Changes to your diet.  Medicine.  Surgery. Follow these instructions at home: Eating and drinking   Follow a diet as told by your doctor. You may need to avoid foods and drinks such as: ? Coffee and tea (with or without caffeine). ? Drinks that contain alcohol. ? Energy drinks and sports drinks. ? Bubbly (carbonated) drinks or sodas. ? Chocolate and cocoa. ? Peppermint and mint flavorings. ? Garlic and onions. ? Horseradish. ? Spicy and acidic foods. These include peppers, chili powder, curry powder, vinegar, hot sauces, and BBQ sauce. ? Citrus fruit juices and citrus fruits, such as oranges, lemons, and limes. ? Tomato-based foods. These include red sauce, chili, salsa, and pizza with red sauce. ? Fried and fatty foods. These include donuts, french fries, potato chips, and high-fat dressings. ? High-fat meats. These include hot dogs, rib eye steak, sausage, ham, and bacon. ? High-fat dairy items, such as whole milk, butter, and cream cheese.  Eat small meals often. Avoid eating large meals.  Avoid drinking large amounts of liquid with your meals.  Avoid eating meals during the 2-3 hours before bedtime.  Avoid lying down right after you eat.  Do not exercise right after you eat. Lifestyle   Do not use any products that contain nicotine or tobacco. These include cigarettes, e-cigarettes, and chewing tobacco. If you need help quitting, ask your doctor.  Try to lower your stress. If you need help doing this, ask your doctor.  If you are overweight, lose an amount of weight that is healthy for you. Ask your doctor about a safe weight loss goal. General instructions  Pay attention to any changes in your symptoms.  Take over-the-counter and prescription medicines only as told by your doctor. Do not take aspirin, ibuprofen, or other NSAIDs unless your doctor says it is okay.  Wear loose  clothes. Do not wear anything tight around your waist.  Raise (elevate) the head of your bed about 6 inches (15 cm).  Avoid bending over if this makes your symptoms worse.  Keep all follow-up visits as told by your doctor. This is important. Contact a doctor if:  You have new symptoms.  You lose weight and you do not know why.  You have trouble swallowing or it hurts to swallow.  You have wheezing or a cough that keeps happening.  Your symptoms do not get better with treatment.  You have a hoarse voice. Get help right away if:  You have pain in your arms, neck, jaw, teeth, or back.  You feel sweaty, dizzy, or light-headed.  You have chest pain or shortness of breath.  You throw up (vomit) and your throw-up looks like blood or coffee grounds.  You pass out (faint).  Your poop (stool) is bloody or black.  You cannot swallow, drink, or eat. Summary  If a person has gastroesophageal reflux disease (GERD), food and stomach acid move  back up into the esophagus and cause symptoms or problems such as damage to the esophagus.  Treatment will depend on how bad your symptoms are.  Follow a diet as told by your doctor.  Take all medicines only as told by your doctor. This information is not intended to replace advice given to you by your health care provider. Make sure you discuss any questions you have with your health care provider. Document Released: 11/17/2007 Document Revised: 12/07/2017 Document Reviewed: 12/07/2017 Elsevier Interactive Patient Education  2019 [place] for Gastroesophageal Reflux Disease, Adult When you have gastroesophageal reflux disease (GERD), the foods you eat and your eating habits are very important. Choosing the right foods can help ease your discomfort. Think about working with a nutrition specialist (dietitian) to help you make good choices. What are tips for following this plan?  Meals  Choose healthy foods that  are low in fat, such as fruits, vegetables, whole grains, low-fat dairy products, and lean meat, fish, and poultry.  Eat small meals often instead of 3 large meals a day. Eat your meals slowly, and in a place where you are relaxed. Avoid bending over or lying down until 2-3 hours after eating.  Avoid eating meals 2-3 hours before bed.  Avoid drinking a lot of liquid with meals.  Cook foods using methods other than frying. Bake, grill, or broil food instead.  Avoid or limit: ? Chocolate. ? Peppermint or spearmint. ? Alcohol. ? Pepper. ? Black and decaffeinated coffee. ? Black and decaffeinated tea. ? Bubbly (carbonated) soft drinks. ? Caffeinated energy drinks and soft drinks.  Limit high-fat foods such as: ? Fatty meat or fried foods. ? Whole milk, cream, butter, or ice cream. ? Nuts and nut butters. ? Pastries, donuts, and sweets made with butter or shortening.  Avoid foods that cause symptoms. These foods may be different for everyone. Common foods that cause symptoms include: ? Tomatoes. ? Oranges, lemons, and limes. ? Peppers. ? Spicy food. ? Onions and garlic. ? Vinegar. Lifestyle  Maintain a healthy weight. Ask your doctor what weight is healthy for you. If you need to lose weight, work with your doctor to do so safely.  Exercise for at least 30 minutes for 5 or more days each week, or as told by your doctor.  Wear loose-fitting clothes.  Do not smoke. If you need help quitting, ask your doctor.  Sleep with the head of your bed higher than your feet. Use a wedge under the mattress or blocks under the bed frame to raise the head of the bed. Summary  When you have gastroesophageal reflux disease (GERD), food and lifestyle choices are very important in easing your symptoms.  Eat small meals often instead of 3 large meals a day. Eat your meals slowly, and in a place where you are relaxed.  Limit high-fat foods such as fatty meat or fried foods.  Avoid bending  over or lying down until 2-3 hours after eating.  Avoid peppermint and spearmint, caffeine, alcohol, and chocolate. This information is not intended to replace advice given to you by your health care provider. Make sure you discuss any questions you have with your health care provider. Document Released: 11/30/2011 Document Revised: 07/06/2016 Document Reviewed: 07/06/2016 Elsevier Interactive Patient Education  2019 [employer].

## 2018-06-09 NOTE — Assessment & Plan Note (Signed)
  Protonix 40 mg tablet  >>>Please take 1 tablet daily 15 minutes to 30 minutes before your first meal of the day as well as before your other medications >>>Try to take at the same time each day >>>take this medication daily  GERD management: >>>Avoid laying flat until 2 hours after meals >>>Elevate head of the bed including entire chest >>>Reduce size of meals and amount of fat, acid, spices, caffeine and sweets >>>If you are smoking, Please stop! >>>Decrease alcohol consumption >>>Work on maintaining a healthy weight with normal BMI   Follow up with Dr. Annamaria Boots in 4-6 weeks to ensure symptoms resolve

## 2018-06-09 NOTE — Progress Notes (Signed)
@Patient  ID: [name] [name], female    DOB: [birth year], 67 y.o.   MRN: [id number]  Chief Complaint  Patient presents with  . Acute Visit    Pt reports of dry cough, throat clearing & wheezing when she woke up this morning. Sx developed this morning.     Referring provider: Gemma Payor, MD  HPI:  67 year old female former smoker followed in our office for COPD/asthma, allergic rhinitis, history of nasal polyps, aspirin allergy, recurrent sinusitis  PMH: GERD Smoker/ Smoking History: Former smoker Maintenance:  Dulera 100, prednisone 10mg   Pt of: Dr. Annamaria Boots  06/09/2018  - Visit   67 year old female patient presenting today for acute visit.  Patient reports that she has had 2 days worth of symptoms of increased cough.  This cough has been dry.  Patient feels that she should be able to bring it up as it sounds wet.  Patient has tried Mucinex and throat lozenges with no relief.  Patient has had to use her rescue inhaler one time with minimal relief.  Patient is also had to use her Combivent 1 time with minimal relief.  Patient remains adherent to her Dulera 100 1 puff daily.  Patient also remains adherent to daily prednisone of 10 mg.  Patient reports adherence to Protonix 40 mg twice daily but has not changed diet. Pt continues to eat whatever she likes. She is not following a gerd diet.   MMRC - Breathlessness Score 2 - on level ground, I walk slower than people of the same age because of breathlessness, or have to stop for breathe when walking to my own pace     Tests:  Office Spirometry 06/01/12- moderate obstructive airways disease. FVC 3.15/85%, FEV1 1.87/65%, FEV1/FVC 0.59% FEF 25-75% 0.92/36% PFT 02/01/08- we compared her old PFT-mild obstructive airways disease with minimal response to bronchodilator. FVC 4.14/107%, FEV1 2.83/98%, FEV1/FVC 0.68, FEF 25-75% 1.72/56%. Scores have declined. PFT 07/20/2012-moderate obstructive airways disease with response to bronchodilator,  normal lung volumes, diffusion mildly reduced. FVC 3.87/107%, FEV1 2.37/88%, FEV1/FVC 0.61, FEF 25-75% 1.12/39%. TLC 104%, DLCO 71%. 6MWT 07/20/2012-98%, 97%, 98%, 444 m. Noted hip pain. No oxygen limitation. Echocardiogram 08/03/2012-EF 16-94%, gr 1 diastolic dysfunction. No pulmonary hypertension or wall motion abnormality FeNO-12/18/15- 13  FENO:  No results found for: NITRICOXIDE  PFT: No flowsheet data found.  Imaging: No results found.    Specialty Problems      Pulmonary Problems   Allergic rhinitis due to pollen    Qualifier: Diagnosis of  By: Annamaria Boots MD, Clinton D       Aspirin-sensitive asthma with nasal polyps    Asthma/nasal polyps/aspirin allergy triad       COPD mixed type (HCC)    Chronic maintenance steroids. Office Spirometry 06/01/12- moderate obstructive airways disease. FVC 3.15/85%, FEV1 1.87/65%, FEV1/FVC 0.59% FEF 25-75% 0.92/36%  PFT 02/01/08- we compared her old PFT-mild obstructive airways disease with minimal response to bronchodilator. FVC 4.14/107%, FEV1 2.83/98%, FEV1/FVC 0.68, FEF 25-75% 1.72/56%. Scores have declined. PFT 07/20/2012-moderate obstructive airways disease with response to bronchodilator, normal lung volumes, diffusion mildly reduced. FVC 3.87/107%, FEV1 2.37/88%, FEV1/FVC 0.61, FEF 25-75% 1.12/39%. TLC 104%, DLCO 71%.  6MWT 07/20/2012-98%, 97%, 98%, 444 m. Noted hip pain. No oxygen limitation. Echocardiogram 08/03/2012-EF 50-38%, gr 1 diastolic dysfunction. No pulmonary hypertension or wall motion abnormality.        COPD with acute exacerbation (HCC)      Allergies  Allergen Reactions  . Aspirin Shortness Of Breath    Immunization  History  Administered Date(s) Administered  . Influenza Split 05/14/2013, 03/26/2014, 03/25/2015  . Influenza,inj,Quad PF,6+ Mos 03/05/2018  . Pneumococcal Polysaccharide-23 09/01/2012, 03/30/2018  . Tdap 09/01/2012    Past Medical History:  Diagnosis Date  . Allergic rhinitis, cause  unspecified    failed allergy vaccine  . Allergy   . Anxiety    panic attacks  . Arthritis   . COPD (chronic obstructive pulmonary disease) ([place])   . Depression   . Diverticulosis   . Extrinsic asthma, unspecified   . GERD (gastroesophageal reflux disease)   . Hiatal hernia   . Polyp of nasal cavity   . PONV (postoperative nausea and vomiting)   . S/P dilatation of esophageal stricture 08-2008  . 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 Unspecified sinusitis (chronic)   . Vision abnormalities     Tobacco History: Social History   Tobacco Use  Smoking Status Former Smoker  . Packs/day: 0.50  . Years: 20.00  . Pack years: 10.00  . Types: Cigarettes  . Last attempt to quit: 06/14/1988  . Years since quitting: 30.0  Smokeless Tobacco Never Used   Counseling given: Yes   Outpatient Encounter Medications as of 06/09/2018  Medication Sig  . calcium-vitamin D (OSCAL WITH D) 250-125 MG-UNIT tablet Take 1 tablet by mouth daily.  . chlordiazePOXIDE (LIBRIUM) 5 MG capsule Take 1 capsule (5 mg total) by mouth 2 (two) times daily as needed for anxiety.  . clidinium-chlordiazePOXIDE (LIBRAX) 5-2.5 MG capsule TAKE 1 CAPSULE 3 TIMES DAILY AS NEEDED  . clopidogrel (PLAVIX) 75 MG tablet TAKE 1 TABLET (75 MG TOTAL) BY MOUTH DAILY.  Marland Kitchen COMBIVENT RESPIMAT 20-100 MCG/ACT AERS respimat TAKE 1 PUFF BY MOUTH TWICE A DAY  . dicyclomine (BENTYL) 20 MG tablet Take 1 tablet (20 mg total) by mouth every 6 (six) hours.  . docusate sodium (COLACE) 100 MG capsule Take 100 mg by mouth 2 (two) times daily.  Marland Kitchen escitalopram (LEXAPRO) 10 MG tablet TAKE ONE TABLET (10 MG DOSE) BY MOUTH DAILY.  . ferrous sulfate 325 (65 FE) MG EC tablet Take 325 mg by mouth 3 (three) times daily with meals.  . hyoscyamine (LEVSIN SL) 0.125 MG SL tablet DISSOVE 1 TABLET UNDER TONGUE EVERY 4 (FOUR) HOURS AS NEEDED FOR CRAMPING.  . methylphenidate (RITALIN) 10 MG tablet Take one pill po tid  . mometasone-formoterol (DULERA)  100-5 MCG/ACT AERO Inhale 2 puffs into the lungs 2 (two) times daily.  . pantoprazole (PROTONIX) 40 MG tablet TAKE 1 TABLET (40 MG TOTAL) BY MOUTH 2 (TWO) TIMES DAILY.  Marland Kitchen predniSONE (DELTASONE) 10 MG tablet TAKE 1 TABLET (10 MG TOTAL) BY MOUTH DAILY WITH BREAKFAST.  Marland Kitchen Spacer/Aero-Holding Chambers (AEROCHAMBER MV) inhaler Use as instructed  . [DISCONTINUED] gabapentin (NEURONTIN) 300 MG capsule TAKE 1 CAPSULE BY MOUTH THREE TIMES A DAY  . albuterol (PROVENTIL HFA;VENTOLIN HFA) 108 (90 Base) MCG/ACT inhaler Inhale 2 puffs into the lungs every 6 (six) hours as needed for wheezing or shortness of breath. (Patient not taking: Reported on 06/09/2018)  . azithromycin (ZITHROMAX) 250 MG tablet 500mg  (two tablets) today, then 250mg  (1 tablet) for the next 4 days  . predniSONE (DELTASONE) 10 MG tablet Take 2 tablets (20mg  total) daily for the next 5 days. Take in the AM with food.   No facility-administered encounter medications on file as of 06/09/2018.      Review of Systems  Review of Systems  Constitutional: Positive for fatigue. Negative for chills, fever and unexpected weight change.  HENT: Positive for congestion and postnasal drip. Negative for ear pain, sinus pressure and sinus pain.   Respiratory: Positive for cough (dry cough ), chest tightness and wheezing. Negative for shortness of breath.   Cardiovascular: Negative for chest pain and palpitations.  Gastrointestinal: Negative for blood in stool, diarrhea, nausea and vomiting.       +GERD - every other day   Genitourinary: Negative for dysuria, frequency and urgency.  Musculoskeletal: Negative for arthralgias.  Skin: Negative for color change.  Allergic/Immunologic: Negative for environmental allergies and food allergies.  Neurological: Negative for dizziness, light-headedness and headaches.  Psychiatric/Behavioral: Positive for confusion. Negative for dysphoric mood. The patient is not nervous/anxious.   All other systems reviewed and  are negative.    Physical Exam  BP 124/82 (BP Location: Left Arm, Cuff Size: Normal)   Pulse (!) 114   Temp 98.2 F (36.8 C) (Oral)   Ht 5\' 9"  (1.753 m)   Wt 171 lb 6.4 oz (77.7 kg)   SpO2 93%   BMI 25.31 kg/m   Wt Readings from Last 5 Encounters:  06/09/18 171 lb 6.4 oz (77.7 kg)  03/30/18 171 lb 9.6 oz (77.8 kg)  03/16/18 166 lb 8 oz (75.5 kg)  11/02/17 169 lb (76.7 kg)  09/14/17 174 lb (78.9 kg)   Physical Exam  Constitutional: She is oriented to person, place, and time and well-developed, well-nourished, and in no distress. No distress.  HENT:  Head: Normocephalic and atraumatic.  Right Ear: Hearing, tympanic membrane, external ear and ear canal normal.  Left Ear: Hearing, tympanic membrane, external ear and ear canal normal.  Nose: Mucosal edema and rhinorrhea present. Right sinus exhibits no maxillary sinus tenderness and no frontal sinus tenderness. Left sinus exhibits no maxillary sinus tenderness and no frontal sinus tenderness.  Mouth/Throat: Uvula is midline and oropharynx is clear and moist. No oropharyngeal exudate.  Eyes: Pupils are equal, round, and reactive to light.  Neck: Normal range of motion. Neck supple.  Cardiovascular: Normal rate, regular rhythm and normal heart sounds.  Pulmonary/Chest: Effort normal and breath sounds normal. No accessory muscle usage. No respiratory distress. She has no decreased breath sounds. She has no wheezes. She has no rhonchi.  Musculoskeletal: Normal range of motion.        General: No edema.  Lymphadenopathy:    She has no cervical adenopathy.  Neurological: She is alert and oriented to person, place, and time. Gait normal.  Skin: Skin is warm and dry. She is not diaphoretic. No erythema.  Psychiatric: Mood, memory, affect and judgment normal.  Nursing note and vitals reviewed.    Lab Results:  CBC    Component Value Date/Time   WBC 11.5 (H) 11/02/2017 1117   RBC 4.31 11/02/2017 1117   HGB 9.6 (L) 11/02/2017 1117     HCT 30.4 (L) 11/02/2017 1117   PLT 439.0 (H) 11/02/2017 1117   MCV 70.4 (L) 11/02/2017 1117   MCH 26.5 09/01/2012 1717   MCHC 31.8 11/02/2017 1117   RDW 17.0 (H) 11/02/2017 1117   LYMPHSABS 1.7 11/02/2017 1117   MONOABS 0.8 11/02/2017 1117   EOSABS 0.2 11/02/2017 1117   BASOSABS 0.1 11/02/2017 1117    BMET    Component Value Date/Time   NA 143 11/02/2017 1117   NA 143 10/02/2014 1548   K 3.8 11/02/2017 1117   CL 104 11/02/2017 1117   CO2 31 11/02/2017 1117   GLUCOSE 95 11/02/2017 1117   BUN 21 11/02/2017 1117   BUN  14 10/02/2014 1548   CREATININE 0.97 11/02/2017 1117   CALCIUM 9.1 11/02/2017 1117   GFRNONAA 59 (L) 10/02/2014 1548   GFRAA 68 10/02/2014 1548    BNP No results found for: BNP  ProBNP No results found for: PROBNP    Assessment & Plan:   67 year old female patient completing acute visit with our office.  Will treat as acute COPD exacerbation.  I strongly believe that this may be linked directly to patient's uncontrolled acid reflux.  Emphasized repeatedly to the patient that she needs to adjust and change her diet.  As she is already on Protonix 40 mg twice daily.  If patient symptoms are not improving she needs to follow-up with our office.  COPD mixed type (HCC) Azithromycin 250mg  tablet  >>>Take 2 tablets (500mg  total) today, and then 1 tablet (250mg ) for the next four days  >>>take with food  >>>can also take probiotic and / or yogurt while on antibiotic   Prednisone 10mg  tablet  >>>Take 2 tablets (20 mg total) daily for the next 5 days, then resume 10mg  daily  >>> Take with food in the morning  Dulera 100 >>> 2 puffs in the morning right when you wake up, rinse out your mouth after use, 12 hours later 2 puffs, rinse after use >>> Take this daily, no matter what >>> This is not a rescue inhaler   Follow up with Dr. Annamaria Boots in 4-6 weeks to ensure symptoms resolve    GERD  Protonix 40 mg tablet  >>>Please take 1 tablet daily 15 minutes to  30 minutes before your first meal of the day as well as before your other medications >>>Try to take at the same time each day >>>take this medication daily  GERD management: >>>Avoid laying flat until 2 hours after meals >>>Elevate head of the bed including entire chest >>>Reduce size of meals and amount of fat, acid, spices, caffeine and sweets >>>If you are smoking, Please stop! >>>Decrease alcohol consumption >>>Work on maintaining a healthy weight with normal BMI   Follow up with Dr. Annamaria Boots in 4-6 weeks to ensure symptoms resolve       Lauraine Rinne, NP 06/09/2018   This appointment was 32 minutes long with over 50% of the time in direct face-to-face patient care, assessment, plan of care, and follow-up.

## 2018-06-16 ENCOUNTER — Telehealth: Payer: Self-pay | Admitting: Internal Medicine

## 2018-06-16 NOTE — Telephone Encounter (Signed)
Called and spoke with pt stating to her the recs per CY. Pt expressed understanding. Nothing further needed.

## 2018-06-16 NOTE — Telephone Encounter (Signed)
Attempted to contact pt. I did not receive an answer. I have left a message for pt to return our call.  

## 2018-06-16 NOTE — Telephone Encounter (Signed)
Patient called back wanting to know what other OTC meds she can take.  She stated that she can not take alkaseltzer, because it contains ASA, and she can not take Theraflu products, because of her GERD.    Dr. Annamaria Boots, please advise  Allergies  Allergen Reactions  . Aspirin Shortness Of Breath   Current Outpatient Medications on File Prior to Visit  Medication Sig Dispense Refill  . albuterol (PROVENTIL HFA;VENTOLIN HFA) 108 (90 Base) MCG/ACT inhaler Inhale 2 puffs into the lungs every 6 (six) hours as needed for wheezing or shortness of breath. (Patient not taking: Reported on 06/09/2018) 1 Inhaler 6  . azithromycin (ZITHROMAX) 250 MG tablet 500mg  (two tablets) today, then 250mg  (1 tablet) for the next 4 days 6 tablet 0  . calcium-vitamin D (OSCAL WITH D) 250-125 MG-UNIT tablet Take 1 tablet by mouth daily.    . chlordiazePOXIDE (LIBRIUM) 5 MG capsule Take 1 capsule (5 mg total) by mouth 2 (two) times daily as needed for anxiety. 60 capsule 0  . clidinium-chlordiazePOXIDE (LIBRAX) 5-2.5 MG capsule TAKE 1 CAPSULE 3 TIMES DAILY AS NEEDED 270 capsule 2  . clopidogrel (PLAVIX) 75 MG tablet TAKE 1 TABLET (75 MG TOTAL) BY MOUTH DAILY. 30 tablet 10  . COMBIVENT RESPIMAT 20-100 MCG/ACT AERS respimat TAKE 1 PUFF BY MOUTH TWICE A DAY 1 Inhaler 0  . dicyclomine (BENTYL) 20 MG tablet Take 1 tablet (20 mg total) by mouth every 6 (six) hours. 90 tablet 1  . docusate sodium (COLACE) 100 MG capsule Take 100 mg by mouth 2 (two) times daily.    Marland Kitchen escitalopram (LEXAPRO) 10 MG tablet TAKE ONE TABLET (10 MG DOSE) BY MOUTH DAILY.  3  . ferrous sulfate 325 (65 FE) MG EC tablet Take 325 mg by mouth 3 (three) times daily with meals.    . hyoscyamine (LEVSIN SL) 0.125 MG SL tablet DISSOVE 1 TABLET UNDER TONGUE EVERY 4 (FOUR) HOURS AS NEEDED FOR CRAMPING. 120 tablet 5  . methylphenidate (RITALIN) 10 MG tablet Take one pill po tid 90 tablet 0  . mometasone-formoterol (DULERA) 100-5 MCG/ACT AERO Inhale 2 puffs into the  lungs 2 (two) times daily.    . pantoprazole (PROTONIX) 40 MG tablet TAKE 1 TABLET (40 MG TOTAL) BY MOUTH 2 (TWO) TIMES DAILY. 60 tablet 5  . predniSONE (DELTASONE) 10 MG tablet TAKE 1 TABLET (10 MG TOTAL) BY MOUTH DAILY WITH BREAKFAST. 30 tablet 6  . predniSONE (DELTASONE) 10 MG tablet Take 2 tablets (20mg  total) daily for the next 5 days. Take in the AM with food. 10 tablet 0  . Spacer/Aero-Holding Chambers (AEROCHAMBER MV) inhaler Use as instructed 1 each 0   No current facility-administered medications on file prior to visit.

## 2018-06-16 NOTE — Telephone Encounter (Signed)
Spoke with pt. States that she is not feeling well. Reports cough, chest congestion, sore throat. Cough is producing yellow mucus. Denies chest tightness, wheezing, shortness of breath and fever. She saw Aaron Edelman on 06/09/18 and was given prednisone and a Zpack. States that she still wasn't feeling well and went to urgent care and was given a large prednisone taper. Pt is currently taking 40mg  daily. She would like CY's recommendations.  CY - please advise. Thanks.  Allergies  Allergen Reactions  . Aspirin Shortness Of Breath   Current Outpatient Medications on File Prior to Visit  Medication Sig Dispense Refill  . albuterol (PROVENTIL HFA;VENTOLIN HFA) 108 (90 Base) MCG/ACT inhaler Inhale 2 puffs into the lungs every 6 (six) hours as needed for wheezing or shortness of breath. (Patient not taking: Reported on 06/09/2018) 1 Inhaler 6  . azithromycin (ZITHROMAX) 250 MG tablet 500mg  (two tablets) today, then 250mg  (1 tablet) for the next 4 days 6 tablet 0  . calcium-vitamin D (OSCAL WITH D) 250-125 MG-UNIT tablet Take 1 tablet by mouth daily.    . chlordiazePOXIDE (LIBRIUM) 5 MG capsule Take 1 capsule (5 mg total) by mouth 2 (two) times daily as needed for anxiety. 60 capsule 0  . clidinium-chlordiazePOXIDE (LIBRAX) 5-2.5 MG capsule TAKE 1 CAPSULE 3 TIMES DAILY AS NEEDED 270 capsule 2  . clopidogrel (PLAVIX) 75 MG tablet TAKE 1 TABLET (75 MG TOTAL) BY MOUTH DAILY. 30 tablet 10  . COMBIVENT RESPIMAT 20-100 MCG/ACT AERS respimat TAKE 1 PUFF BY MOUTH TWICE A DAY 1 Inhaler 0  . dicyclomine (BENTYL) 20 MG tablet Take 1 tablet (20 mg total) by mouth every 6 (six) hours. 90 tablet 1  . docusate sodium (COLACE) 100 MG capsule Take 100 mg by mouth 2 (two) times daily.    Marland Kitchen escitalopram (LEXAPRO) 10 MG tablet TAKE ONE TABLET (10 MG DOSE) BY MOUTH DAILY.  3  . ferrous sulfate 325 (65 FE) MG EC tablet Take 325 mg by mouth 3 (three) times daily with meals.    . hyoscyamine (LEVSIN SL) 0.125 MG SL tablet  DISSOVE 1 TABLET UNDER TONGUE EVERY 4 (FOUR) HOURS AS NEEDED FOR CRAMPING. 120 tablet 5  . methylphenidate (RITALIN) 10 MG tablet Take one pill po tid 90 tablet 0  . mometasone-formoterol (DULERA) 100-5 MCG/ACT AERO Inhale 2 puffs into the lungs 2 (two) times daily.    . pantoprazole (PROTONIX) 40 MG tablet TAKE 1 TABLET (40 MG TOTAL) BY MOUTH 2 (TWO) TIMES DAILY. 60 tablet 5  . predniSONE (DELTASONE) 10 MG tablet TAKE 1 TABLET (10 MG TOTAL) BY MOUTH DAILY WITH BREAKFAST. 30 tablet 6  . predniSONE (DELTASONE) 10 MG tablet Take 2 tablets (20mg  total) daily for the next 5 days. Take in the AM with food. 10 tablet 0  . Spacer/Aero-Holding Chambers (AEROCHAMBER MV) inhaler Use as instructed 1 each 0   No current facility-administered medications on file prior to visit.

## 2018-06-16 NOTE — Telephone Encounter (Signed)
Most of these respiratory infections now are viral, and just really slow to clear.  Ok to add an otc symptom therapy like Theraflu or Alkaseltzer Cold and Flu or something similar. Stay well-hydrated. If running fever, coughing really dark, infected looking mucus or getting worse then we can get her seen here.

## 2018-06-16 NOTE — Telephone Encounter (Signed)
Try a Tylenol Cold and Flu type product. She can also ask her pharmacist for suggestions.

## 2018-06-16 NOTE — Telephone Encounter (Signed)
Pt is returning call. Cb is [phone].

## 2018-06-16 NOTE — Telephone Encounter (Signed)
Called and spoke with Patient.  Dr Young recommendations given.  Understanding stated.  Nothing further at this time. 

## 2018-07-01 ENCOUNTER — Other Ambulatory Visit: Payer: Self-pay | Admitting: Internal Medicine

## 2018-07-03 ENCOUNTER — Telehealth: Payer: Self-pay | Admitting: Internal Medicine

## 2018-07-03 MED ORDER — CILIDINIUM-CHLORDIAZEPOXIDE 2.5-5 MG PO CAPS: ORAL_CAPSULE | ORAL | 1 refills | Status: DC

## 2018-07-03 NOTE — Telephone Encounter (Signed)
Spoke with pt. She is needing a refill on Librax. Rx has been sent in. Nothing further was needed.

## 2018-07-03 NOTE — Telephone Encounter (Signed)
CY Please advise on refill. Thanks.  

## 2018-07-03 NOTE — Telephone Encounter (Signed)
Librium refill e-sent

## 2018-07-14 ENCOUNTER — Other Ambulatory Visit: Payer: Self-pay | Admitting: Neurology

## 2018-07-17 ENCOUNTER — Other Ambulatory Visit: Payer: Self-pay | Admitting: Internal Medicine

## 2018-07-21 ENCOUNTER — Telehealth: Payer: Self-pay | Admitting: Gastroenterology

## 2018-07-21 MED ORDER — HYOSCYAMINE SULFATE 0.125 MG SL SUBL: SUBLINGUAL_TABLET | SUBLINGUAL | 0 refills | Status: DC

## 2018-07-21 MED ORDER — PANTOPRAZOLE SODIUM 40 MG PO TBEC: 40.0000 mg | DELAYED_RELEASE_TABLET | Freq: Two times a day (BID) | ORAL | 0 refills | Status: DC

## 2018-07-21 NOTE — Telephone Encounter (Signed)
Pt advised that she needs to have her meds sent to [facility]  fax [phone] for all meds that Dr.Stark has proscribed to her. Also, would like to know what dicyclomine (BENTYL) is for she does not recall having to take that. JG

## 2018-07-21 NOTE — Telephone Encounter (Signed)
Prescriptions for Levsin and Protonix sent to [facility]. Patient notified.

## 2018-07-24 ENCOUNTER — Other Ambulatory Visit: Payer: Self-pay | Admitting: Neurology

## 2018-07-24 MED ORDER — METHYLPHENIDATE HCL 10 MG PO TABS: ORAL_TABLET | ORAL | 0 refills | Status: DC

## 2018-07-24 MED ORDER — ESCITALOPRAM OXALATE 5 MG PO TABS: 5.0000 mg | ORAL_TABLET | Freq: Every day | ORAL | 11 refills | Status: DC

## 2018-07-24 NOTE — Telephone Encounter (Signed)
Pt request refill for methylphenidate (RITALIN) 10 MG tablet sent to [facility].  She also needs refill for escitalopram (LEXAPRO) 10 MG [facility] she is out of this medication. She is requesting additional refills to be sent to [facility] Refill for clopidogrel (PLAVIX) 75 MG tablet sent to [facility]-   [facility] will only be using [facility] for new medications she will be trying 1st or if she runs out and needs refill asap. FYI

## 2018-07-24 NOTE — Telephone Encounter (Addendum)
Spoke with Dr. Felecia Shelling- okay to call in lexapro 5mg  tablet, take 1 tab po qd.   I called pt and relayed above information. I escribed rx lexapro.   I checked drug registry. She last refilled Ritalin on 05/16/18 #90. Last seen 03/16/18 and next f/u 09/20/18

## 2018-07-24 NOTE — Telephone Encounter (Signed)
I reviewed pt's chart. Rx Ritalin cannot be sent to mail order d/t being controlled. [facility].   [facility]ix [facility], 3 refills. Should be available for pt to pick up refill.   Lexapro has not been rx'd by Dr. Felecia Shelling in many years. Last documented on 03/16/17 that pt stopped d/t making her too sleepy during the day. I will need to call pt to get further information.

## 2018-07-24 NOTE — Telephone Encounter (Signed)
I called and spoke with pt. I relayed below info. She is aware rx [facility] have [facility]. She will pick up refills [facility]avix [facility].   She has been taking lexapro- old rx of 10mg  tablet 91/2 tab qd) from Dr. Felecia Shelling from 2017. She is wanting a refill. Advised I will have to talk with Dr. Felecia Shelling to see if ok to refill since he has not prescribed in many years. She is not sure if she has been getting from PCP or not. Advised I will call back and let her know

## 2018-07-26 NOTE — Telephone Encounter (Signed)
Pt call in and stated she needed to speak with the nurse about the prescriptions that were sent to [facility] she rec a letter from [facility] stating that the prescription was changed.

## 2018-07-26 NOTE — Telephone Encounter (Signed)
Patient states she was told by [facility] that she has two new prescriptions sent to them. I informed patient that I sent the two medications she requested to be sent to [facility] and these are medications she is already currently taking. Patient states they must be just new to [facility]. Patient also states she is having some occasional solid food dysphagia and wants to know if she should make an appointment. Informed patient she needs to be evaluated if this is a new symptom. Patient states it just started a couple of weeks ago and only happens occasionally but has to wait for food to go down before she can take another bite. Informed patient she needs to make an appt. Scheduled appt to see Dr. Fuller Plan on 08/10/18 at 9:00am

## 2018-08-03 ENCOUNTER — Ambulatory Visit (INDEPENDENT_AMBULATORY_CARE_PROVIDER_SITE_OTHER): Payer: Medicare Other | Admitting: Internal Medicine

## 2018-08-03 ENCOUNTER — Encounter: Payer: Self-pay | Admitting: Internal Medicine

## 2018-08-03 DIAGNOSIS — J449 Chronic obstructive pulmonary disease, unspecified: Secondary | ICD-10-CM

## 2018-08-03 DIAGNOSIS — K219 Gastro-esophageal reflux disease without esophagitis: Secondary | ICD-10-CM | POA: Diagnosis not present

## 2018-08-03 LAB — PULMONARY FUNCTION TEST
DL/VA % pred: 91 %
DL/VA: 3.65 ml/min/mmHg/L
DLCO unc % pred: 88 %
DLCO unc: 20.53 ml/min/mmHg
FEF 25-75 Post: 1.39 L/sec
FEF 25-75 Pre: 1.51 L/sec
FEF2575-%Change-Post: -8 %
FEF2575-%Pred-Post: 59 %
FEF2575-%Pred-Pre: 64 %
FEV1-%Change-Post: -1 %
FEV1-%Pred-Post: 80 %
FEV1-%Pred-Pre: 81 %
FEV1-Post: 2.29 L
FEV1-Pre: 2.31 L
FEV1FVC-%Change-Post: 1 %
FEV1FVC-%Pred-Pre: 90 %
FEV6-%Change-Post: -1 %
FEV6-%Pred-Post: 90 %
FEV6-%Pred-Pre: 91 %
FEV6-Post: 3.23 L
FEV6-Pre: 3.28 L
FEV6FVC-%Change-Post: 1 %
FEV6FVC-%Pred-Post: 104 %
FEV6FVC-%Pred-Pre: 102 %
FVC-%Change-Post: -2 %
FVC-%Pred-Post: 87 %
FVC-%Pred-Pre: 89 %
FVC-Post: 3.25 L
FVC-Pre: 3.34 L
Post FEV1/FVC ratio: 70 %
Post FEV6/FVC ratio: 100 %
Pre FEV1/FVC ratio: 69 %
Pre FEV6/FVC Ratio: 98 %
RV % pred: 92 %
RV: 2.2 L
TLC % pred: 98 %
TLC: 5.72 L

## 2018-08-03 NOTE — Progress Notes (Signed)
Subjective:    Patient ID: [name] [name], female    DOB: [birth year], 68 y.o.   MRN: [id number]  HPI  female former smoker followed for asthma/COPD, allergic rhinitis/history nasal polyps/aspirin allergy, recurrent sinusitis, complicated by GERD Office Spirometry 06/01/12- moderate obstructive airways disease. FVC 3.15/85%, FEV1 1.87/65%, FEV1/FVC 0.59% FEF 25-75% 0.92/36% PFT 02/01/08- we compared her old PFT-mild obstructive airways disease with minimal response to bronchodilator. FVC 4.14/107%, FEV1 2.83/98%, FEV1/FVC 0.68, FEF 25-75% 1.72/56%. Scores have declined. PFT 07/20/2012-moderate obstructive airways disease with response to bronchodilator, normal lung volumes, diffusion mildly reduced. FVC 3.87/107%, FEV1 2.37/88%, FEV1/FVC 0.61, FEF 25-75% 1.12/39%. TLC 104%, DLCO 71%. 6MWT 07/20/2012-98%, 97%, 98%, 444 m. Noted hip pain. No oxygen limitation. Echocardiogram 08/03/2012-EF 30-16%, gr 1 diastolic dysfunction. No pulmonary hypertension or wall motion abnormality FeNO-12/18/15- 13  -----------------------------------------------------------------------------------------------------------------  03/30/18- 69 year old female former smoker followed for asthma/COPD, allergic rhinitis/history nasal polyps/aspirin allergy, recurrent sinusitis, complicated by GERD -----Moderate COPD mixed type-no wheezing, gerd symptoms Combivent Respimat, prednisone 10 mg daily, Symbicort 160, albuterol HFA,  She has realized that a tight feeling in her chest she was attributing to asthma, is likely related to her GERD and is relieved by hyoscyamine and Librax.  Has a little cough or wheeze now. Currently using Symbicort maintenance and either Combivent or albuterol rescue inhaler for occasional use, depending on price.  Has a coupon for Combivent.  08/03/2018- 68 year old female former smoker followed for asthma/COPD, allergic rhinitis/history nasal polyps/aspirin allergy, recurrent sinusitis, complicated  by GERD Given GERD advice and Z-Pak at last OV here 06/09/2018 for acute visit. -----COPD mixed type: Review PFT with patient in detail. Pt states she feels better since last visit with [facility] had to go to UC next day for increase in Prednisone taper.  She has had both Combivent and albuterol inhalers.  Combivent works best when she can afford it, uses less often. We reviewed her chest x-ray and [facility] report from [facility].  She denies productive cough but recognizes role of reflux.  Does feel occasional heartburn.  Did better when she had Librax rather than plain Librium and she says her insurance cannot afford it now. PFT 08/03/2018-FVC 3.25/87%, FEV1 2.29/80%, ratio 0.70, FEF 25-75% 1.39/59%, NSC with dilator, TLC 98%, DLCO 88% CXR 03/31/2018- IMPRESSION: Mild bibasilar subsegmental atelectasis, otherwise negative chest. No acute infiltrate.   ROS-see HPI   + = positive Constitutional:    weight loss, night sweats, fevers, chills, fatigue, lassitude. HEENT:    headaches, difficulty swallowing, tooth/dental problems, sore throat,       sneezing, itching, ear ache, +nasal congestion, post nasal drip, snoring CV:    chest pain, orthopnea, PND, swelling in lower extremities, anasarca,                                                   dizziness, palpitations Resp:   shortness of breath with exertion or at rest.                productive cough,   non-productive cough, coughing up of blood.              change in color of mucus.  wheezing.   Skin:    rash or lesions. GI:  No-   heartburn, indigestion, abdominal pain, nausea, vomiting,  GU:  MS:   joint pain, stiffness,  Neuro-  nothing unusual Psych:  change in mood or affect.  depression or anxiety.   memory loss.  OBJ- Physical Exam General- Alert, Oriented, Affect-appropriate, Distress- none acute Skin- rash-none, lesions- none, excoriation- none Lymphadenopathy- none Head- atraumatic            Eyes- Gross vision intact,  PERRLA, conjunctivae and secretions clear            Ears- Hearing, canals-normal            Nose- not- stuffy, no-Septal dev, mucus, polyps, erosion, perforation             Throat- Mallampati III , mucosa clear/not dry , drainage- none, tonsils- atrophic,                          , + dentures, Neck- flexible , trachea midline, no stridor , thyroid nl, carotid no bruit Chest - symmetrical excursion , unlabored           Heart/CV- RRR , no murmur , no gallop  , no rub, nl s1 s2                           - JVD- none , edema- none, stasis changes- none, varices- none           Lung- clear to P&A, wheeze- none, cough +, dullness-none, rub- none           Chest wall-  Abd-  Br/ Gen/ Rectal- Not done, not indicated Extrem- cyanosis- none, clubbing, none, atrophy- none, strength- nl Neuro- grossly intact to observation

## 2018-08-03 NOTE — Patient Instructions (Signed)
Ok to call for refills as needed  Please call if we can help

## 2018-08-03 NOTE — Progress Notes (Signed)
PFT completed today.  

## 2018-08-06 NOTE — Assessment & Plan Note (Signed)
Emphasis on reflux precautions.  We did discuss Librax which has been helpful to her.  She can call for refill.

## 2018-08-06 NOTE — Assessment & Plan Note (Signed)
She feels stable now.  I suggested we refill Combivent since her insurance will cover it, and drop rescue albuterol as duplication.  She will call for refills when needed.

## 2018-08-10 ENCOUNTER — Ambulatory Visit: Payer: Medicare HMO | Admitting: Gastroenterology

## 2018-08-16 ENCOUNTER — Other Ambulatory Visit: Payer: Self-pay | Admitting: Neurology

## 2018-08-23 ENCOUNTER — Other Ambulatory Visit: Payer: Self-pay | Admitting: *Deleted

## 2018-08-23 MED ORDER — ESCITALOPRAM OXALATE 5 MG PO TABS: 5.0000 mg | ORAL_TABLET | Freq: Every day | ORAL | 3 refills | Status: DC

## 2018-08-23 MED ORDER — CLOPIDOGREL BISULFATE 75 MG PO TABS: 75.0000 mg | ORAL_TABLET | Freq: Every day | ORAL | 3 refills | Status: DC

## 2018-09-06 ENCOUNTER — Other Ambulatory Visit: Payer: Self-pay | Admitting: Internal Medicine

## 2018-09-17 ENCOUNTER — Other Ambulatory Visit: Payer: Self-pay | Admitting: Internal Medicine

## 2018-09-19 ENCOUNTER — Telehealth: Payer: Self-pay | Admitting: *Deleted

## 2018-09-19 NOTE — Telephone Encounter (Signed)
I called and spoke with pt. Updated her med list/allergies/pharmacy on file.   She verified she received email to set up virtual visit tomorrow with Dr. Felecia Shelling. She will call back if she has any questions about this.  Email: [email or web address]

## 2018-09-20 ENCOUNTER — Encounter: Payer: Self-pay | Admitting: Neurology

## 2018-09-20 ENCOUNTER — Other Ambulatory Visit: Payer: Self-pay

## 2018-09-20 ENCOUNTER — Ambulatory Visit (INDEPENDENT_AMBULATORY_CARE_PROVIDER_SITE_OTHER): Payer: Medicare Other | Admitting: Neurology

## 2018-09-20 DIAGNOSIS — R9082 White matter disease, unspecified: Secondary | ICD-10-CM | POA: Diagnosis not present

## 2018-09-20 DIAGNOSIS — R413 Other amnesia: Secondary | ICD-10-CM

## 2018-09-20 DIAGNOSIS — R41840 Attention and concentration deficit: Secondary | ICD-10-CM | POA: Diagnosis not present

## 2018-09-20 MED ORDER — METHYLPHENIDATE HCL 10 MG PO TABS: ORAL_TABLET | ORAL | 0 refills | Status: DC

## 2018-09-20 NOTE — Progress Notes (Signed)
[facility]  PATIENT: [name] [name] DOB: [birth year]  REFERRING DOCTOR OR PCP:  Ricke Hey SOURCE: patients and records form [facility] records  _________________________________   HISTORICAL  CHIEF COMPLAINT:  No chief complaint on file.   HISTORY OF PRESENT ILLNESS:  [name] [name] is a 68 y.o. woman who reporting memory decline.   Update 09/20/2018 Virtual Visit via Video Note I connected with [name] [name] on 09/20/18 at  1:00 PM EDT by a video enabled telemedicine application and verified that I am speaking with the correct person using two identifiers.   I discussed the limitations of evaluation and management by telemedicine and the availability of in person appointments. The patient expressed understanding and agreed to proceed.  History of Present Illness: She feels memory is doing about the same.  She takes methylphenidate 10 mg daily and rarely a second pill.     She feels mood is doing well.   She occasionally gets irritable.    She takes Lexapro 5 mg daily.  She denies any TIA or stroke symptoms.   She is on Plavix and tolerates it well.  I reviewed her last MRI of the brain.  It shows confluent white matter changes consistent with severe chronic microvascular ischemic change.   She reports that her gait is stable,   No numbness or weakness.  Bladder is doing well.  She tries to stay active and plays games on her computer like Sudoku and matching games     Observations/Objective: She is a well-developed well-nourished woman in no acute distress.  Head is normocephalic and atraumatic.  Visible skin appears normal.  She is alert and fully oriented fluent speech and apparent good attention, knowledge and memory.  Extraocular muscles are intact.  Facial strength and trapezius strength appear normal.  Palatal elevation and tongue protrusion are midline.  Finger-nose-finger and rapid alternating movements are performed well.  Arm strength  appears normal.  Sensation, reflexes and gait cannot be adequately assessed.  Assessment and Plan: Memory difficulties  White matter abnormality on MRI of brain  Attention deficit  1.  Continue Plavix for history of stroke. 2.  Return in 10 mg up to 3 times a day as needed for focus/attention. 3.  Stay active and exercise as tolerated. 4.   Return in 6 months or sooner if there are new or worsening neurologic symptoms.   Follow Up Instructions:    I discussed the assessment and treatment plan with the patient. The patient was provided an opportunity to ask questions and all were answered. The patient agreed with the plan and demonstrated an understanding of the instructions.   The patient was advised to call back or seek an in-person evaluation if the symptoms worsen or if the condition fails to improve as anticipated.  I provided 59minutes of non-face-to-face time during this encounter.   Britt Bottom, MD  _____________________ From previous visits: Update 03/16/2018: She feels her memory is doing the same.   She notes problems with remembering what's been said in conversation.   She feels she does ok with names. She sometimes repeats herself.    She remains on Ritalin and thinks it may help her mildly.   She never started the donepezil prescribed at the last visit  She has had some depression but stopped Prozac as she wondered if she needed the med for a while. However, as she reported feeling sad,  it was rewritten for her and she is taking 1/2 pill daily.  She is taking clopidogrel and Folbee and tolerates them well.   She has moderately severe chronic microvascular ischemic changes on CT.      She was having back pain radiating down her leg and was given a dosepak and is scheduled for MRI.    Update 09/14/2017:     She feels her short-term memory is doing about the same.  It has not worsened since the last visit.  She notes that when she forgets,  she usually remembers if  she gets a hint.  She sometimes repeats herself.   She forgets where things are placed in the house.    She is not noting any difficulty driving. Ritalin seems to help her energy but she is not sure it helps memory much .    She tolerates it well and there have not been any complications.       Mood is doing about the same as the last visit. She feels down at times.  She gets frustrated easily, especially if she forgets something.  Fluoxetine may be helping a little bit.  There are no suicidal or homicidal ideations.  Although Ritalin helped her focus, it did not change the mood.  She sleeps well most nights getting about 6 hours.   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    She feels she is about the sane as last year or slightly worse but not much worse.    She does not get lost while driving and remembers to turn off appliances.  She doesn't always remember what she has just talked about in conversation.   This frustrates her when it occurs at work.    She usually feels that with hints she remembers what was forgotten.    She felt Ritalin helped her focus and attention.   She tolerates it well.  She feels she has a mild depression and sometimes gets frustrated easily..   She is on fluoxetine.    She sleeps well most nights getting about 6 hours of sleep.   She will usually just take Ritalin twice a day because the third one sometimes caused insomnia.  ______________________________- From 02/08/2016 Short term memory issues are her biggest concern.    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  E doesn't remember to make lists most days.  She uses some reminders Holiday representative notes)   She denies any difficulty doing math.  She has not become lost while driving. She has not had difficulty remembering to turn appliances off.   Attention and focus are poor.    Ritalin and feels it helped her focus but not  her memory.      Her mother was in her 33s when she had Alzheimer's disease. Her sister, brother and aunt all had strokes.     CT scan 12/10/2014 showed moderate to severe periventricular subcortical white matter changes consistent with chronic microvascular ischemia.    Mood:   She has had depression and gets frustrated easily.  She feels her husband hasn't been responsive enough to her problems.   She gets frustrated easily, especially at her husband.   She stopped escitalopram and had more mood swings.    Sertraline made her sleepy   Sleep:    She feels that she falls asleep and stays asleep well. Husband has not noted her snoring or gasping for  air at night.   REVIEW OF SYSTEMS: Constitutional: No fevers, chills, sweats, or change in appetite.    Some fatigue Eyes: No visual changes, double vision, eye pain Ear, nose and throat: No hearing loss, ear pain, nasal congestion, sore throat Cardiovascular: No chest pain, palpitations Respiratory: reports shortness of breath and wheezes   No snores GastrointestinaI: No nausea, vomiting, diarrhea, abdominal pain, fecal incontinence Genitourinary: No dysuria, urinary retention or frequency.  No nocturia. Musculoskeletal: No neck pain, back pain but notes pain in the hips and knees. . Integumentary: No rash, pruritus, skin lesions Neurological: as above Psychiatric: Notes depression at this time but no anxiety Endocrine: No palpitations, diaphoresis, change in appetite, change in weigh or increased thirst Hematologic/Lymphatic: No anemia, purpura, petechiae. Allergic/Immunologic: No itchy/runny eyes, nasal congestion,   She has some seasonal allergies.  ALLERGIES: Allergies  Allergen Reactions   Aspirin Shortness Of Breath    HOME MEDICATIONS:  Current Outpatient Medications:    albuterol (PROVENTIL HFA;VENTOLIN HFA) 108 (90 Base) MCG/ACT inhaler, Inhale 2 puffs into the lungs every 6 (six) hours as needed for wheezing or shortness  of breath., Disp: 1 Inhaler, Rfl: 6   calcium-vitamin D (OSCAL WITH D) 250-125 MG-UNIT tablet, Take 1 tablet by mouth daily., Disp: , Rfl:    clidinium-chlordiazePOXIDE (LIBRAX) 5-2.5 MG capsule, TAKE 1 CAPSULE BY MOUTH THREE TIMES A DAY AS NEEDED, Disp: 270 capsule, Rfl: 1   clopidogrel (PLAVIX) 75 MG tablet, Take 1 tablet (75 mg total) by mouth daily., Disp: 90 tablet, Rfl: 3   COMBIVENT RESPIMAT 20-100 MCG/ACT AERS respimat, TAKE 1 PUFF BY MOUTH TWICE A DAY, Disp: 1 Inhaler, Rfl: 3   docusate sodium (COLACE) 100 MG capsule, Take 100 mg by mouth 2 (two) times daily., Disp: , Rfl:    escitalopram (LEXAPRO) 5 MG tablet, Take 1 tablet (5 mg total) by mouth daily., Disp: 90 tablet, Rfl: 3   ferrous sulfate 325 (65 FE) MG EC tablet, Take 325 mg by mouth every other day. , Disp: , Rfl:    hyoscyamine (LEVSIN SL) 0.125 MG SL tablet, DISSOVE 1 TABLET UNDER TONGUE EVERY 4 (FOUR) HOURS AS NEEDED FOR CRAMPING., Disp: 360 tablet, Rfl: 0   methylphenidate (RITALIN) 10 MG tablet, Take one pill po up to three times a day, Disp: 90 tablet, Rfl: 0   mometasone-formoterol (DULERA) 100-5 MCG/ACT AERO, Inhale 2 puffs into the lungs daily. , Disp: , Rfl:    pantoprazole (PROTONIX) 40 MG tablet, Take 1 tablet (40 mg total) by mouth 2 (two) times daily., Disp: 180 tablet, Rfl: 0   predniSONE (DELTASONE) 10 MG tablet, TAKE 1 TABLET (10 MG TOTAL) BY MOUTH DAILY WITH BREAKFAST., Disp: 30 tablet, Rfl: 6   Spacer/Aero-Holding Chambers (AEROCHAMBER MV) inhaler, Use as instructed, Disp: 1 each, Rfl: 0  PAST MEDICAL HISTORY: Past Medical History:  Diagnosis Date   Allergic rhinitis, cause unspecified    failed allergy vaccine   Allergy    Anxiety    panic attacks   Arthritis    COPD (chronic obstructive pulmonary disease) (HCC)    Depression    Diverticulosis    Extrinsic asthma, unspecified    GERD (gastroesophageal reflux disease)    Hiatal hernia    Polyp of nasal cavity    PONV  (postoperative nausea and vomiting)    S/P dilatation of esophageal stricture 08-2008   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   Unspecified sinusitis (chronic)    Vision abnormalities     PAST SURGICAL  HISTORY: Past Surgical History:  Procedure Laterality Date   BUNIONECTOMY     left foot   CHOLECYSTECTOMY  05/05/2012   Procedure: LAPAROSCOPIC CHOLECYSTECTOMY WITH INTRAOPERATIVE CHOLANGIOGRAM;  Surgeon: Gayland Curry, MD,FACS;  Location: [place];  Service: General;  Laterality: N/A;   COLONOSCOPY     COLONOSCOPY W/ BIOPSIES AND POLYPECTOMY     NASAL POLYP SURGERY     x 3    POLYPECTOMY     THUMB FUSION  10/13   rt hand  pinned but pin removed   TONSILLECTOMY      FAMILY HISTORY: Family History  Problem Relation Age of Onset   Colon cancer Maternal Grandfather        unsure age of onset    Cancer Maternal Grandfather        colon   Stroke Maternal Aunt    Heart disease Father        Vague history   Prostate cancer Brother    Ataxia Daughter        died at age 29.5    SOCIAL HISTORY:  Social History   Socioeconomic History   Marital status: Married    Spouse name: Not on file   Number of children: 2   Years of education: Not on file   Highest education level: Not on file  Occupational History   Occupation: Scientist, research (medical): [place] resource strain: Not on file   Food insecurity:    Worry: Not on file    Inability: Not on file   Transportation needs:    Medical: Not on file    Non-medical: Not on file  Tobacco Use   Smoking status: Former Smoker    Packs/day: 0.50    Years: 20.00    Pack years: 10.00    Types: Cigarettes    Last attempt to quit: 06/14/1988    Years since quitting: 30.2   Smokeless tobacco: Never Used  Substance and Sexual Activity   Alcohol use: No   Drug use: No   Sexual activity: Yes    Birth control/protection: Post-menopausal  Lifestyle    Physical activity:    Days per week: Not on file    Minutes per session: Not on file   Stress: Not on file  Relationships   Social connections:    Talks on phone: Not on file    Gets together: Not on file    Attends religious service: Not on file    Active member of club or organization: Not on file    Attends meetings of clubs or organizations: Not on file    Relationship status: Not on file   Intimate partner violence:    Fear of current or ex partner: Not on file    Emotionally abused: Not on file    Physically abused: Not on file    Forced sexual activity: Not on file  Other Topics Concern   Not on file  Social History Narrative   Lives with husband.       PHYSICAL EXAM  There were no vitals filed for this visit.  There is no height or weight on file to calculate BMI.   General: The patient is well-developed and well-nourished and in no acute distress   Neurologic Exam  Mental status: The patient is alert and oriented x 3 at the time of the examination. The patient has good recent (3/3 without prompt) and good remote memory, with a  slightly reduced  attention span and concentration ability ([id number]);   WORLD-DLROW).     Speech is normal.     Cranial nerves: Extraocular movements are full.  Facial strength and sensation are normal.   Trapezius strength is normal.    The tongue is midline, and the patient has symmetric elevation of the soft palate.    Motor:  Muscle bulk is normal.   Tone is normal. Strength is  5 / 5 in all 4 extremities.   Sensory: Sensory testing is intact to touch,.   Coordination: Cerebellar testing reveals good finger-nose-finger and heel-to-shin bilaterally.  Gait and station: Station is normal.   Gait is normal for age . Tandem gait is mildly wide.   Marland Kitchen   DTRs are normal and symmetric.   Marland Kitchen        DIAGNOSTIC DATA (LABS, IMAGING, TESTING) - I reviewed patient records, labs, notes, testing and imaging myself where  available.  Lab Results  Component Value Date   WBC 11.5 (H) 11/02/2017   HGB 9.6 (L) 11/02/2017   HCT 30.4 (L) 11/02/2017   MCV 70.4 (L) 11/02/2017   PLT 439.0 (H) 11/02/2017      Component Value Date/Time   NA 143 11/02/2017 1117   NA 143 10/02/2014 1548   K 3.8 11/02/2017 1117   CL 104 11/02/2017 1117   CO2 31 11/02/2017 1117   GLUCOSE 95 11/02/2017 1117   BUN 21 11/02/2017 1117   BUN 14 10/02/2014 1548   CREATININE 0.97 11/02/2017 1117   CALCIUM 9.1 11/02/2017 1117   PROT 7.0 11/02/2017 1117   PROT 6.6 10/02/2014 1548   ALBUMIN 4.1 11/02/2017 1117   ALBUMIN 4.3 10/02/2014 1548   AST 22 11/02/2017 1117   ALT 18 11/02/2017 1117   ALKPHOS 93 11/02/2017 1117   BILITOT 0.4 11/02/2017 1117   BILITOT <0.2 10/02/2014 1548   GFRNONAA 59 (L) 10/02/2014 1548   GFRAA 68 10/02/2014 1548   Lab Results  Component Value Date   CHOL 228 (H) 09/01/2012   HDL 60.00 09/01/2012   LDLDIRECT 146.4 09/01/2012   TRIG 167.0 (H) 09/01/2012   CHOLHDL 4 09/01/2012   Lab Results  Component Value Date   HGBA1C 5.7 09/01/2012   Lab Results  Component Value Date   VITAMINB12 507 10/02/2014   Lab Results  Component Value Date   TSH 1.210 10/02/2014       ASSESSMENT AND PLAN  Memory difficulties  White matter abnormality on MRI of brain  Attention deficit  Anella Nakata A. Felecia Shelling, MD, PhD 0/02/9832, 8:25 PM Certified in Neurology, Clinical Neurophysiology, Sleep Medicine, Pain Medicine and Neuroimaging  [facility] [address], [place], [place] [address] [phone]

## 2018-10-20 ENCOUNTER — Other Ambulatory Visit: Payer: Self-pay | Admitting: Gastroenterology

## 2018-10-27 ENCOUNTER — Telehealth: Payer: Self-pay | Admitting: Neurology

## 2018-10-27 NOTE — Telephone Encounter (Signed)
Pt is needing a refill on her methylphenidate (RITALIN) 10 MG tablet sent to the [facility] [address] in [facility]

## 2018-10-30 NOTE — Telephone Encounter (Signed)
Checked drug registry. She last refilled 10/28/18 #90. It is too soon for her to refill. Not due until on or after 11/27/18

## 2018-11-11 ENCOUNTER — Other Ambulatory Visit: Payer: Self-pay | Admitting: Internal Medicine

## 2018-12-21 ENCOUNTER — Telehealth: Payer: Self-pay | Admitting: Internal Medicine

## 2018-12-21 MED ORDER — AZITHROMYCIN 250 MG PO TABS: ORAL_TABLET | ORAL | 0 refills | Status: AC

## 2018-12-21 NOTE — Telephone Encounter (Signed)
Spoke with pt. She is aware of CY's response. Rx has been sent in. Nothing further was needed. 

## 2018-12-21 NOTE — Telephone Encounter (Signed)
Offer Zpak    250 mg, # 6, 2 today then one daily 

## 2018-12-21 NOTE — Telephone Encounter (Signed)
Spoke with pt. She states that she is not feeling well. Reports increased cough, congestion and sore throat. Cough is producing foamy, white mucus. Denies chest tightness, wheezing, shortness of breath or fever. Was tested for COVID last week and was negative. Pt declined an appointment and a televisit. She would like to have something sent in.  CY - please advise. Thanks.

## 2018-12-22 ENCOUNTER — Telehealth: Payer: Self-pay | Admitting: Internal Medicine

## 2018-12-22 MED ORDER — PREDNISONE 10 MG PO TABS: ORAL_TABLET | ORAL | 0 refills | Status: DC

## 2018-12-22 NOTE — Telephone Encounter (Signed)
Called and spoke with Patient. Dr Annamaria Boots recommendations given.  Understanding stated.  Prednisone prescription sent to [facility], [facility] [address], [place].  Nothing further at this time.

## 2018-12-22 NOTE — Telephone Encounter (Signed)
Called and spoke with pt stating to her that [facility] wanted to send Rx for nebulizer and solution and pt stated that she used to have a nebulizer but that it did not work for her so she sent it back.  Pt does not want to have another Rx sent in for nebulizer or solution.  Pt is requesting prednisone Rx to be called in.

## 2018-12-22 NOTE — Telephone Encounter (Signed)
Called and spoke with pt who stated she began coughing and having congestion yesterday 7/9. Pt stated she has coughed up clear mucus but stated another time when she got mucus up it had some yellow tinge to it.  Pt states that she has some SOB involved as well as tightness in chest. Pt also states that she has had some wheezing.  Pt used her rescue inhaler twice yesterday to help with her symptoms but pt stated she did not feel like it helped to open her up. Pt is using her dulera inhaler as prescribed.  Pt denies any complaints of fever.  Pt wants to know what we recommend to help with her symptoms. Dr. Annamaria Boots, please advise. Thank you!  Allergies  Allergen Reactions  . Aspirin Shortness Of Breath     Current Outpatient Medications:  .  albuterol (PROVENTIL HFA;VENTOLIN HFA) 108 (90 Base) MCG/ACT inhaler, Inhale 2 puffs into the lungs every 6 (six) hours as needed for wheezing or shortness of breath., Disp: 1 Inhaler, Rfl: 6 .  azithromycin (ZITHROMAX Z-PAK) 250 MG tablet, Take 2 tablets (500 mg) on  Day 1,  followed by 1 tablet (250 mg) once daily on Days 2 through 5., Disp: 6 each, Rfl: 0 .  calcium-vitamin D (OSCAL WITH D) 250-125 MG-UNIT tablet, Take 1 tablet by mouth daily., Disp: , Rfl:  .  clidinium-chlordiazePOXIDE (LIBRAX) 5-2.5 MG capsule, TAKE 1 CAPSULE BY MOUTH THREE TIMES A DAY AS NEEDED, Disp: 270 capsule, Rfl: 0 .  clopidogrel (PLAVIX) 75 MG tablet, Take 1 tablet (75 mg total) by mouth daily., Disp: 90 tablet, Rfl: 3 .  COMBIVENT RESPIMAT 20-100 MCG/ACT AERS respimat, TAKE 1 PUFF BY MOUTH TWICE A DAY, Disp: 1 Inhaler, Rfl: 3 .  docusate sodium (COLACE) 100 MG capsule, Take 100 mg by mouth 2 (two) times daily., Disp: , Rfl:  .  escitalopram (LEXAPRO) 5 MG tablet, Take 1 tablet (5 mg total) by mouth daily., Disp: 90 tablet, Rfl: 3 .  ferrous sulfate 325 (65 FE) MG EC tablet, Take 325 mg by mouth every other day. , Disp: , Rfl:  .  hyoscyamine (LEVSIN SL) 0.125 MG SL tablet,  DISSOVE 1 TABLET UNDER TONGUE EVERY 4 (FOUR) HOURS AS NEEDED FOR CRAMPING., Disp: 360 tablet, Rfl: 0 .  methylphenidate (RITALIN) 10 MG tablet, Take one pill po up to three times a day, Disp: 90 tablet, Rfl: 0 .  mometasone-formoterol (DULERA) 100-5 MCG/ACT AERO, Inhale 2 puffs into the lungs daily. , Disp: , Rfl:  .  pantoprazole (PROTONIX) 40 MG tablet, TAKE 1 TABLET BY MOUTH TWO  TIMES DAILY, Disp: 180 tablet, Rfl: 0 .  predniSONE (DELTASONE) 10 MG tablet, TAKE 1 TABLET (10 MG TOTAL) BY MOUTH DAILY WITH BREAKFAST., Disp: 30 tablet, Rfl: 6 .  Spacer/Aero-Holding Chambers (AEROCHAMBER MV) inhaler, Use as instructed, Disp: 1 each, Rfl: 0

## 2018-12-22 NOTE — Telephone Encounter (Signed)
We sent azithromycin Zpak yesterday, 7/10. Is this a new call today or just a left-over message?

## 2018-12-22 NOTE — Telephone Encounter (Signed)
I don't think she has a nebulizer and that may help her.  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

## 2018-12-22 NOTE — Telephone Encounter (Signed)
Offer prednisone 10 mg, # 20, 4 X 2 DAYS, 3 X 2 DAYS, 2 X 2 DAYS, 1 X 2 DAYS As she finishes this, she can resume her usual maintenance prednisone

## 2018-12-22 NOTE — Telephone Encounter (Signed)
[place], thanks. Suggest she use Mucinex-DM otc to help with chest congestion and cough, unless she knows of something else she wants to use.

## 2018-12-22 NOTE — Telephone Encounter (Signed)
Called & spoke w/ pt. Pt states she was using Mucinex-DM q6h yesterday and that is has not helped her symptoms. She also reports taking Tylenol Cold & Flu without therapeutic effects. Pt states her chest feels tight and congested and she feels as though she can't cough anything up. Pt states the nebulizer treatment gives her panic attacks.   Pt is requesting an Rx for additional prednisone. She takes a maintenance dose of 2mg  predisone daily. She states she has benefited from a prednisone taper in the past. Reports that she was supposed to have a bone density exam this morning, however, her temp 99.0*F and she was sent home. She states she was recently tested negative for COVID and received another test today which she is awaiting results for.   CY, please advise with further recommendations for this pt. Thank you.

## 2018-12-22 NOTE — Telephone Encounter (Signed)
No, this is not a left-over message as pt did call again this morning in regards to the cough/congestion.  Called and spoke with pt to see if she did pick up the zpak that was sent to pharmacy yesterday 7/9 and pt said that she did pick up the Rx and took the first two pills yesterday as directed.

## 2018-12-24 ENCOUNTER — Telehealth: Payer: Self-pay | Admitting: Internal Medicine

## 2018-12-24 NOTE — Telephone Encounter (Signed)
Congestion, cough, SOB and now low grade fever even after z pack and steroids.  Not really producing any sputum.  Had COVID test sent by PCP, she is not sure of results.  Will have her come in to clinic, may need CXR and/or stronger abx.  Erskine Emery MD

## 2018-12-25 ENCOUNTER — Telehealth: Payer: Self-pay | Admitting: Internal Medicine

## 2018-12-25 MED ORDER — DOXYCYCLINE HYCLATE 100 MG PO TABS: 100.0000 mg | ORAL_TABLET | Freq: Two times a day (BID) | ORAL | 0 refills | Status: DC

## 2018-12-25 NOTE — Telephone Encounter (Signed)
Called & spoke w/ pt. Pt states she already had an additional COVID-19 test performed 12/22/2018 and will not get results back until 12/29/2018. I let her know I would communicate w/ CY and get back with her if he has any additional recommendations. Pt expressed understanding.   CY, please advise of any additional recommendations you have for this pt. Thank you.

## 2018-12-25 NOTE — Telephone Encounter (Signed)
Called & spoke w/ pt regarding CY's recommendations. Pt verbalized understanding, agreed to these measures, and had no additional questions. I verified pt's pharmacy and placed the order for doxycycline 100 mg, #14, 1 twice daily. Nothing further needed at this time.

## 2018-12-25 NOTE — Telephone Encounter (Signed)
[place]- please send her for Covid testing due to febrile respiratory illness.  I think she was negative a week or 2 ago, but may have converted by now.

## 2018-12-25 NOTE — Telephone Encounter (Signed)
Offer doxycycline 100 mg, # 14, 1 twice daily 

## 2018-12-25 NOTE — Telephone Encounter (Signed)
Call returned to patient, she states she was given zpack and prednisone 12/21/2018 and still has congestion. She ran a fever yesterday of 101 but retook is this morning and her temp is 97.8. She states she remains SOB and congested. She reports this started about a week ago and the congestion does seem to be getting better.  She states she spoke with on call this weekend and they told her to call for an appt. She is requesting something be called in. I made here aware I would touch bases with her provider and get back with her. Voiced understanding.   CY please advise. Thanks.

## 2018-12-27 ENCOUNTER — Telehealth: Payer: Self-pay | Admitting: Internal Medicine

## 2018-12-27 MED ORDER — ONDANSETRON HCL 8 MG PO TABS: 8.0000 mg | ORAL_TABLET | Freq: Three times a day (TID) | ORAL | 0 refills | Status: DC | PRN

## 2018-12-27 NOTE — Telephone Encounter (Signed)
Primary Pulmonologist: Dr. Annamaria Boots Last office visit and with whom: 08/03/18  What do we see them for (pulmonary problems): COPD/GERD/Esophagitis Last OV assessment/plan:  08/03/2018- 68 year old female former smoker followed for asthma/COPD, allergic rhinitis/history nasal polyps/aspirin allergy, recurrent sinusitis, complicated by GERD Given GERD advice and Z-Pak at last OV here 06/09/2018 for acute visit. -----COPD mixed type: Review PFT with patient in detail. Pt states she feels better since last visit with [place] had to go to UC next day for increase in Prednisone taper.  She has had both Combivent and albuterol inhalers.  Combivent works best when she can afford it, uses less often. We reviewed her chest x-ray and imaging report from [facility].  She denies productive cough but recognizes role of reflux.  Does feel occasional heartburn.  Did better when she had Librax rather than plain Librium and she says her insurance cannot afford it now. PFT 08/03/2018-FVC 3.25/87%, FEV1 2.29/80%, ratio 0.70, FEF 25-75% 1.39/59%, NSC with dilator, TLC 98%, DLCO 88% CXR 03/31/2018- IMPRESSION: Mild bibasilar subsegmental atelectasis, otherwise negative chest. No acute infiltrate.    Reason for call: Pt states she has a bad sinus infection x 6 Days.She has not has an appetite. She had covid testing on last Friday but it has not resulted yet. She is very dizzy and nauseous.  She has a dry cough and chest congestion. Pt denies sob. On Sunday she had a temp of 99.0. She was given doxycycline 100mg  and Prednisone taper on Friday. She is taking an old suppository for the nausea that is expired and would like a new rx if possible.  Pharm: [facility] [place]

## 2018-12-27 NOTE — Telephone Encounter (Signed)
Called and spoke with pt letting her know that CY requested [place] to send zofran to pharmacy to see if it would help with nausea and pt verbalized understanding. Verified pt's preferred pharmacy and sent Rx in. Nothing further needed.

## 2018-12-27 NOTE — Telephone Encounter (Signed)
Offer Zofran 8 mg tab   # 5,   1 every 8 hours if needed for nausea

## 2018-12-29 ENCOUNTER — Telehealth: Payer: Self-pay | Admitting: Internal Medicine

## 2018-12-29 NOTE — Telephone Encounter (Signed)
Called & spoke w/ pt. Pt states her COVID-19 test done at [place] at [facility] came back positive. She wanted to let Dr. Annamaria Boots know of this. I let her know I would route a message to him. Pt expressed understanding.  CY, please advise. Thank you.

## 2019-01-01 ENCOUNTER — Telehealth: Payer: Self-pay | Admitting: Internal Medicine

## 2019-01-01 MED ORDER — ACETAMINOPHEN-CODEINE #3 300-30 MG PO TABS: 1.0000 | ORAL_TABLET | Freq: Four times a day (QID) | ORAL | 0 refills | Status: DC | PRN

## 2019-01-01 NOTE — Telephone Encounter (Signed)
Many people with aspirin allergy can take an NSAID like Advil. Does she know if she can? It would be a good alternative to Tylenol. If she cannot take Advil, then let us know and we can send Tylenol # 3 which has codeine in it.

## 2019-01-01 NOTE — Telephone Encounter (Signed)
Called & spoke w/ pt regarding CY's recommendations. Pt verbalized understanding, but states she has a headache unresolved by Tylenol. I asked pt how much Tylenol she is taking, which she responded to once daily. Pt states she would like something stronger. I let her know I would route a message to CY to see if he has any further recommendations.   CY, please advise. Thank you.

## 2019-01-01 NOTE — Telephone Encounter (Signed)
Script e-sent for Tylenol# 3 with codeine for headache if needed.

## 2019-01-01 NOTE — Telephone Encounter (Signed)
Called & spoke w/ pt regarding CY's recommendations. Pt verbalized understanding and states she cannot take Advil despite CY's recommendations. I let her know of the other recommendation-- Tylenol #3, which she agreed to.   Routing this back to CY for f/u. CY, please advise. Thank you.

## 2019-01-01 NOTE — Telephone Encounter (Signed)
Since there is no specific treatment, the concern is how sick she is. If she starts feeling really sick- very short of breath, weak, chest pains, then she should go to the ER to be assessed. Otherwise, she needs to self-quarantine at home for 2 weeks. Avoid getting dehydrated. Simple comfort measures like Tylenol and otc cold and flu remedies might help her feel a little better.

## 2019-01-01 NOTE — Telephone Encounter (Signed)
ATC pt on home number, line rang busy. Called and left VM on mobile number.  WCB.

## 2019-01-01 NOTE — Telephone Encounter (Signed)
See telephone note from 12/29/2018. I have routed a message to [facility] for recommendations for pt's headache. Will close out this message to prevent duplication. Nothing further needed at this time.

## 2019-01-01 NOTE — Telephone Encounter (Signed)
Called & spoke w/ pt to let her know her Tylenol #3 has been sent to her preferred pharmacy. Pt verbalized understanding with no additional questions. Nothing further needed at this time.

## 2019-01-01 NOTE — Telephone Encounter (Signed)
Dr. Annamaria Boots, please advise if you have any recs or advise for pt in regards to the positive covid test. Thanks!

## 2019-01-03 DIAGNOSIS — U071 COVID-19: Secondary | ICD-10-CM | POA: Insufficient documentation

## 2019-01-10 ENCOUNTER — Other Ambulatory Visit: Payer: Self-pay | Admitting: Internal Medicine

## 2019-01-10 DIAGNOSIS — F339 Major depressive disorder, recurrent, unspecified: Secondary | ICD-10-CM | POA: Insufficient documentation

## 2019-01-11 NOTE — Telephone Encounter (Signed)
Refill request received from pt's pharmacy. Dr. Annamaria Boots, please advise if you are okay refilling this med.  Allergies  Allergen Reactions  . Aspirin Shortness Of Breath     Current Outpatient Medications:  .  albuterol (PROVENTIL HFA;VENTOLIN HFA) 108 (90 Base) MCG/ACT inhaler, Inhale 2 puffs into the lungs every 6 (six) hours as needed for wheezing or shortness of breath., Disp: 1 Inhaler, Rfl: 6 .  calcium-vitamin D (OSCAL WITH D) 250-125 MG-UNIT tablet, Take 1 tablet by mouth daily., Disp: , Rfl:  .  clidinium-chlordiazePOXIDE (LIBRAX) 5-2.5 MG capsule, TAKE 1 CAPSULE BY MOUTH THREE TIMES A DAY AS NEEDED, Disp: 270 capsule, Rfl: 0 .  clopidogrel (PLAVIX) 75 MG tablet, Take 1 tablet (75 mg total) by mouth daily., Disp: 90 tablet, Rfl: 3 .  COMBIVENT RESPIMAT 20-100 MCG/ACT AERS respimat, TAKE 1 PUFF BY MOUTH TWICE A DAY, Disp: 1 Inhaler, Rfl: 3 .  docusate sodium (COLACE) 100 MG capsule, Take 100 mg by mouth 2 (two) times daily., Disp: , Rfl:  .  doxycycline (VIBRA-TABS) 100 MG tablet, Take 1 tablet (100 mg total) by mouth 2 (two) times daily., Disp: 14 tablet, Rfl: 0 .  escitalopram (LEXAPRO) 5 MG tablet, Take 1 tablet (5 mg total) by mouth daily., Disp: 90 tablet, Rfl: 3 .  ferrous sulfate 325 (65 FE) MG EC tablet, Take 325 mg by mouth every other day. , Disp: , Rfl:  .  hyoscyamine (LEVSIN SL) 0.125 MG SL tablet, DISSOVE 1 TABLET UNDER TONGUE EVERY 4 (FOUR) HOURS AS NEEDED FOR CRAMPING., Disp: 360 tablet, Rfl: 0 .  methylphenidate (RITALIN) 10 MG tablet, Take one pill po up to three times a day, Disp: 90 tablet, Rfl: 0 .  mometasone-formoterol (DULERA) 100-5 MCG/ACT AERO, Inhale 2 puffs into the lungs daily. , Disp: , Rfl:  .  ondansetron (ZOFRAN) 8 MG tablet, Take 1 tablet (8 mg total) by mouth every 8 (eight) hours as needed for nausea or vomiting., Disp: 5 tablet, Rfl: 0 .  pantoprazole (PROTONIX) 40 MG tablet, TAKE 1 TABLET BY MOUTH TWO  TIMES DAILY, Disp: 180 tablet, Rfl: 0 .   predniSONE (DELTASONE) 10 MG tablet, TAKE 1 TABLET (10 MG TOTAL) BY MOUTH DAILY WITH BREAKFAST., Disp: 30 tablet, Rfl: 6 .  predniSONE (DELTASONE) 10 MG tablet, Take 4,10mg  tabs x's 2day,3,10mg  tabsx2days,2,10mg tabsx2days,1,10mg x2 days, Disp: 20 tablet, Rfl: 0 .  Spacer/Aero-Holding Chambers (AEROCHAMBER MV) inhaler, Use as instructed, Disp: 1 each, Rfl: 0

## 2019-01-12 NOTE — Telephone Encounter (Signed)
Tylenol # 3 refill e-sent

## 2019-01-16 ENCOUNTER — Telehealth: Payer: Self-pay | Admitting: Internal Medicine

## 2019-01-16 NOTE — Telephone Encounter (Signed)
Difficult to say based off of the information provided.  When is the patient having chest pain?  Is it all the time?  Is it with physical exertion?  Is she having wheezing?   Yes okay to transition back to daily prednisone after completing the Rx of dexamethasone from hospital discharge.   I would also make sure the patient has notified primary care regarding her discharge and has scheduled follow-up with them.  Likely this will need to be a video or telephone visit with primary care as she was recently discharged from the hospital with COVID-19.  Please also route this message to Dr. Annamaria Boots as FYI for him if he has any other additional recommendations then he can address this when he comes back to office tomorrow.  Wyn Quaker, FNP

## 2019-01-16 NOTE — Telephone Encounter (Signed)
Ok to use a held spot or to have her see APP in next 1-2 weeks

## 2019-01-16 NOTE — Telephone Encounter (Signed)
Called and spoke with patient. She said she wanted to see Dr. Annamaria Boots for follow up since she was in hospital for Covid-19 and was discharge on 01/08/19 for her asthma.   Next available for Dr. Annamaria Boots is Oct/Nov.  Dr. Annamaria Boots do you want me to schedule in a hold spot? Or with an NP? if so how far out would you like her appt made?   Dr. Annamaria Boots please advise  Allergies  Allergen Reactions  . Aspirin Shortness Of Breath   Current Outpatient Medications on File Prior to Visit  Medication Sig Dispense Refill  . acetaminophen-codeine (TYLENOL #3) 300-30 MG tablet TAKE 1-2 TABLETS BY MOUTH EVERY 6 (SIX) HOURS AS NEEDED FOR UP TO 7 DAYS FOR MODERATE PAIN. 30 tablet 0  . albuterol (PROVENTIL HFA;VENTOLIN HFA) 108 (90 Base) MCG/ACT inhaler Inhale 2 puffs into the lungs every 6 (six) hours as needed for wheezing or shortness of breath. 1 Inhaler 6  . calcium-vitamin D (OSCAL WITH D) 250-125 MG-UNIT tablet Take 1 tablet by mouth daily.    . clidinium-chlordiazePOXIDE (LIBRAX) 5-2.5 MG capsule TAKE 1 CAPSULE BY MOUTH THREE TIMES A DAY AS NEEDED 270 capsule 0  . clopidogrel (PLAVIX) 75 MG tablet Take 1 tablet (75 mg total) by mouth daily. 90 tablet 3  . COMBIVENT RESPIMAT 20-100 MCG/ACT AERS respimat TAKE 1 PUFF BY MOUTH TWICE A DAY 1 Inhaler 3  . docusate sodium (COLACE) 100 MG capsule Take 100 mg by mouth 2 (two) times daily.    Marland Kitchen doxycycline (VIBRA-TABS) 100 MG tablet Take 1 tablet (100 mg total) by mouth 2 (two) times daily. 14 tablet 0  . escitalopram (LEXAPRO) 5 MG tablet Take 1 tablet (5 mg total) by mouth daily. 90 tablet 3  . ferrous sulfate 325 (65 FE) MG EC tablet Take 325 mg by mouth every other day.     . hyoscyamine (LEVSIN SL) 0.125 MG SL tablet DISSOVE 1 TABLET UNDER TONGUE EVERY 4 (FOUR) HOURS AS NEEDED FOR CRAMPING. 360 tablet 0  . methylphenidate (RITALIN) 10 MG tablet Take one pill po up to three times a day 90 tablet 0  . mometasone-formoterol (DULERA) 100-5 MCG/ACT AERO Inhale 2 puffs into  the lungs daily.     . ondansetron (ZOFRAN) 8 MG tablet Take 1 tablet (8 mg total) by mouth every 8 (eight) hours as needed for nausea or vomiting. 5 tablet 0  . pantoprazole (PROTONIX) 40 MG tablet TAKE 1 TABLET BY MOUTH TWO  TIMES DAILY 180 tablet 0  . predniSONE (DELTASONE) 10 MG tablet TAKE 1 TABLET (10 MG TOTAL) BY MOUTH DAILY WITH BREAKFAST. 30 tablet 6  . predniSONE (DELTASONE) 10 MG tablet Take 4,10mg  tabs x's 2day,3,10mg  tabsx2days,2,10mg tabsx2days,1,10mg x2 days 20 tablet 0  . Spacer/Aero-Holding Chambers (AEROCHAMBER MV) inhaler Use as instructed 1 each 0   No current facility-administered medications on file prior to visit.

## 2019-01-16 NOTE — Telephone Encounter (Signed)
Spoke with [name] [name], she states it got worse today, and it started when she was  moving around. She then started coughing but noticed her chest got tight when she started cleaning up around the house but she denies wheezing. She has an appointment on 02/01/2019. I advised her the recommendations from [place]. She states her PCP is Yevonne Pax which is her GYN doctor but felt she needed to make an appt with CY. Please advise.

## 2019-01-16 NOTE — Telephone Encounter (Signed)
While on previous call from patient, she is wondering why she is having chest tightness.   She was just discharged from hospital on 01/08/19 for Covid. She has a follow up with Dr. Annamaria Boots on 01/31/19. See previous phone note  She was told to take dexamethasone and stop the prednisone. Patient has finished that Rx and is on daily 10mg  prednidone. Which she started back 01/14/19.  1. Patient wants to make sure she is okay to do that? 2. Why is she having tightness in her chest (she is moving around much more than she was in the hospital)?  Patient is taking her dulera and combivent inhalers, she has albuterol but hasnt taken it.   Primary Pulmonologist: [facility] Last office visit and with whom: 08/03/18 with Dr. Annamaria Boots What do we see them for (pulmonary problems): asthma/copd Last OV assessment/plan: just discharged from [facility] for positive covid on 01/08/19  Was appointment offered to patient (explain)?  Was told to do a follow up in 1-2 weeks see previous phone note    Aaron Edelman please advise

## 2019-01-16 NOTE — Telephone Encounter (Signed)
Called pt and advised message from the provider. Pt understood and verbalized understanding. Nothing further is needed.    

## 2019-01-16 NOTE — Telephone Encounter (Signed)
Dr. Annamaria Boots please advise if there is anything further for this patient  Allergies  Allergen Reactions  . Aspirin Shortness Of Breath   Current Outpatient Medications on File Prior to Visit  Medication Sig Dispense Refill  . acetaminophen-codeine (TYLENOL #3) 300-30 MG tablet TAKE 1-2 TABLETS BY MOUTH EVERY 6 (SIX) HOURS AS NEEDED FOR UP TO 7 DAYS FOR MODERATE PAIN. 30 tablet 0  . albuterol (PROVENTIL HFA;VENTOLIN HFA) 108 (90 Base) MCG/ACT inhaler Inhale 2 puffs into the lungs every 6 (six) hours as needed for wheezing or shortness of breath. 1 Inhaler 6  . calcium-vitamin D (OSCAL WITH D) 250-125 MG-UNIT tablet Take 1 tablet by mouth daily.    . clidinium-chlordiazePOXIDE (LIBRAX) 5-2.5 MG capsule TAKE 1 CAPSULE BY MOUTH THREE TIMES A DAY AS NEEDED 270 capsule 0  . clopidogrel (PLAVIX) 75 MG tablet Take 1 tablet (75 mg total) by mouth daily. 90 tablet 3  . COMBIVENT RESPIMAT 20-100 MCG/ACT AERS respimat TAKE 1 PUFF BY MOUTH TWICE A DAY 1 Inhaler 3  . docusate sodium (COLACE) 100 MG capsule Take 100 mg by mouth 2 (two) times daily.    Marland Kitchen doxycycline (VIBRA-TABS) 100 MG tablet Take 1 tablet (100 mg total) by mouth 2 (two) times daily. 14 tablet 0  . escitalopram (LEXAPRO) 5 MG tablet Take 1 tablet (5 mg total) by mouth daily. 90 tablet 3  . ferrous sulfate 325 (65 FE) MG EC tablet Take 325 mg by mouth every other day.     . hyoscyamine (LEVSIN SL) 0.125 MG SL tablet DISSOVE 1 TABLET UNDER TONGUE EVERY 4 (FOUR) HOURS AS NEEDED FOR CRAMPING. 360 tablet 0  . methylphenidate (RITALIN) 10 MG tablet Take one pill po up to three times a day 90 tablet 0  . mometasone-formoterol (DULERA) 100-5 MCG/ACT AERO Inhale 2 puffs into the lungs daily.     . ondansetron (ZOFRAN) 8 MG tablet Take 1 tablet (8 mg total) by mouth every 8 (eight) hours as needed for nausea or vomiting. 5 tablet 0  . pantoprazole (PROTONIX) 40 MG tablet TAKE 1 TABLET BY MOUTH TWO  TIMES DAILY 180 tablet 0  . predniSONE (DELTASONE) 10 MG  tablet TAKE 1 TABLET (10 MG TOTAL) BY MOUTH DAILY WITH BREAKFAST. 30 tablet 6  . predniSONE (DELTASONE) 10 MG tablet Take 4,10mg  tabs x's 2day,3,10mg  tabsx2days,2,10mg tabsx2days,1,10mg x2 days 20 tablet 0  . Spacer/Aero-Holding Chambers (AEROCHAMBER MV) inhaler Use as instructed 1 each 0   No current facility-administered medications on file prior to visit.

## 2019-01-16 NOTE — Telephone Encounter (Signed)
Called and spoke with patient. She is only wanting to see Dr. Annamaria Boots. She is going to call and have records faxed over from [facility] visit. I gave patient our fax number and told her to put it to the attention [facility] or Dr. Annamaria Boots.   I will route this to EW to follow up on

## 2019-01-16 NOTE — Telephone Encounter (Signed)
Ok to continue using the inhalers, and the tylenol with codeine we sent for headache. She will need to pace herself, stopping to rest frequently at home. This Covid takes time to get over. As long as she doesn't get worse, we can see her as scheduled.

## 2019-01-16 NOTE — Telephone Encounter (Signed)
Can route to Dr. Annamaria Boots for recs tomm.  As I have never seen the patient before and I am not familiar with her.  Wyn Quaker FNP

## 2019-01-17 ENCOUNTER — Telehealth: Payer: Self-pay | Admitting: Internal Medicine

## 2019-01-17 NOTE — Telephone Encounter (Signed)
Called and spoke w/ pt to create a 1-2 week follow-up appointment. Pt agreed to setting up an appointment for Thursday 01/25/2019 at 4:30 PM. Appt has been scheduled. Nothing further needed at this time.

## 2019-01-17 NOTE — Telephone Encounter (Signed)
Spoke with patient. She stated that she wants Dr. Annamaria Boots to have a copy of her hospital records from her stay back in July to review for her appointment with him. She stated in order to do this, we will need to send a letter on our letterhead stating that she is a patient of practice and we are requesting her records.   Advised her that I would go ahead and type the letter and have Dr. Annamaria Boots to sign tomorrow and fax over to the hospital. She verbalized understanding.   Will route this message to myself for follow up.

## 2019-01-17 NOTE — Telephone Encounter (Signed)
Upon further review, I checked careeverywhere and updated her chart. We can now see the records without needing the fax.   Called patient back to let her know. She verbalized understanding. Nothing further needed at time of call.

## 2019-01-25 ENCOUNTER — Ambulatory Visit: Payer: Medicare Other | Admitting: Internal Medicine

## 2019-01-25 ENCOUNTER — Encounter: Payer: Self-pay | Admitting: Internal Medicine

## 2019-01-25 ENCOUNTER — Other Ambulatory Visit: Payer: Self-pay

## 2019-01-25 VITALS — BP 130/88 | HR 97 | Temp 98.3°F | Ht 69.0 in | Wt 159.6 lb

## 2019-01-25 DIAGNOSIS — J1282 Pneumonia due to coronavirus disease 2019: Secondary | ICD-10-CM

## 2019-01-25 DIAGNOSIS — J449 Chronic obstructive pulmonary disease, unspecified: Secondary | ICD-10-CM

## 2019-01-25 DIAGNOSIS — J1289 Other viral pneumonia: Secondary | ICD-10-CM | POA: Diagnosis not present

## 2019-01-25 DIAGNOSIS — U071 COVID-19: Secondary | ICD-10-CM | POA: Diagnosis not present

## 2019-01-25 DIAGNOSIS — J441 Chronic obstructive pulmonary disease with (acute) exacerbation: Secondary | ICD-10-CM

## 2019-01-25 NOTE — Patient Instructions (Signed)
Use the blue Dulera maintenance inhaler  Inhale 2 puffs, then rinse mouth well, twice every day  Use the Combivent inhaler 1 puff, every 6 hours if needed   OR---  Ventolin albuterol rescue inhaler Pearline Cables Blue)   2 puffs every 6 hours if needed  And/ OR   Atrovent (Green Cap) rescue inhaler   2 puffs every 6 hours if needed  Combivent is like using both Ventolin and Atrovent together.  Walk as able to build your strength back  Please call as needed  Order- CXR   Dx recent Covid pneumonia, COPD mixed type

## 2019-01-25 NOTE — Progress Notes (Signed)
Subjective:    Patient ID: [name] [name], female    DOB: Apr 13, 1951, 68 y.o.   MRN: [id number]  HPI  female former smoker followed for asthma/COPD, allergic rhinitis/history nasal polyps/aspirin allergy, recurrent sinusitis, complicated by GERD Office Spirometry 06/01/12- moderate obstructive airways disease. FVC 3.15/85%, FEV1 1.87/65%, FEV1/FVC 0.59% FEF 25-75% 0.92/36% PFT 02/01/08- we compared her old PFT-mild obstructive airways disease with minimal response to bronchodilator. FVC 4.14/107%, FEV1 2.83/98%, FEV1/FVC 0.68, FEF 25-75% 1.72/56%. Scores have declined. PFT 07/20/2012-moderate obstructive airways disease with response to bronchodilator, normal lung volumes, diffusion mildly reduced. FVC 3.87/107%, FEV1 2.37/88%, FEV1/FVC 0.61, FEF 25-75% 1.12/39%. TLC 104%, DLCO 71%. 6MWT 07/20/2012-98%, 97%, 98%, 444 m. Noted hip pain. No oxygen limitation. Echocardiogram 08/03/2012-EF 16-96%, gr 1 diastolic dysfunction. No pulmonary hypertension or wall motion abnormality FeNO-12/18/15- 13 PFT 08/03/2018-FVC 3.25/87%, FEV1 2.29/80%, ratio 0.70, FEF 25-75% 1.39/59%, NSC with dilator, TLC 98%, DLCO 88% -------------------------------------------------------------------------------------------------------------- 08/03/2018- 68 year old female former smoker followed for asthma/COPD, allergic rhinitis/history nasal polyps/aspirin allergy, recurrent sinusitis, complicated by GERD Given GERD advice and Z-Pak at last OV here 06/09/2018 for acute visit. -----COPD mixed type: Review PFT with patient in detail. Pt states she feels better since last visit with [place] [place] had to go to UC next day for increase in Prednisone taper.  She has had both Combivent and albuterol inhalers.  Combivent works best when she can afford it, uses less often. We reviewed her chest x-ray and imaging report from [facility].  She denies productive cough but recognizes role of reflux.  Does feel occasional heartburn.  Did better  when she had Librax rather than plain Librium and she says her insurance cannot afford it now. PFT 08/03/2018-FVC 3.25/87%, FEV1 2.29/80%, ratio 0.70, FEF 25-75% 1.39/59%, NSC with dilator, TLC 98%, DLCO 88% CXR 03/31/2018- IMPRESSION: Mild bibasilar subsegmental atelectasis, otherwise negative chest. No acute infiltrate.  01/25/2019- 68 year old female former smoker followed for asthma/COPD, allergic rhinitis/history nasal polyps/aspirin allergy, recurrent sinusitis, complicated by GERD -----pt reports varying cough and DOE, recently in hosp for COVID-19 +, Albuterol hfa, CombiventRespimat, Dulera 100, Prednisone 10 mg daily,  Here with husband. After being symptomatic for 2-3 weeks, she had covid testing + in [place]. Got more weak and SOB and was directed to [facility]-hosp several days on O2, no vent. Discharged on room air. Slowly better with dry cough, weak, no fever/ chills. Discussed progression of activity, continued exposure precautions.  She brought several inhaler- reviewed. Doesn't use nebulizer- even Xopenex neb said to trigger panic attacks.   ROS-see HPI   + = positive Constitutional:    weight loss, night sweats, fevers, chills, fatigue, lassitude. HEENT:    headaches, difficulty swallowing, tooth/dental problems, sore throat,       sneezing, itching, ear ache, +nasal congestion, post nasal drip, snoring CV:    chest pain, orthopnea, PND, swelling in lower extremities, anasarca,                                                   dizziness, palpitations Resp:  + shortness of breath with exertion or at rest.                productive cough, +  non-productive cough, coughing up of blood.              change in color of mucus.  wheezing.   Skin:  rash or lesions. GI:  No-   heartburn, indigestion, abdominal pain, nausea, vomiting,  GU:  MS:   joint pain, stiffness,  Neuro-     nothing unusual Psych:  change in mood or affect.  depression or anxiety.   memory loss.  OBJ- Physical  Exam General- Alert, Oriented, Affect-appropriate, Distress- none acute Skin- rash-none, lesions- none, excoriation- none Lymphadenopathy- none Head- atraumatic            Eyes- Gross vision intact, PERRLA, conjunctivae and secretions clear            Ears- Hearing, canals-normal            Nose- not- stuffy, no-Septal dev, mucus, polyps, erosion, perforation             Throat- Mallampati III , mucosa clear/not dry , drainage- none, tonsils- atrophic                    + dentures, Neck- flexible , trachea midline, no stridor , thyroid nl, carotid no bruit Chest - symmetrical excursion , unlabored           Heart/CV- RRR , no murmur , no gallop  , no rub, nl s1 s2                           - JVD- none , edema- none, stasis changes- none, varices- none           Lung- clear to P&A, wheeze- none, cough +,  dullness-none, rub- none           Chest wall-  Abd-  Br/ Gen/ Rectal- Not done, not indicated Extrem- cyanosis- none, clubbing, none, atrophy- none, strength- nl Neuro- grossly intact to observation

## 2019-01-26 ENCOUNTER — Ambulatory Visit (INDEPENDENT_AMBULATORY_CARE_PROVIDER_SITE_OTHER)
Admission: RE | Admit: 2019-01-26 | Discharge: 2019-01-26 | Disposition: A | Discharge status: Home / Self Care | Payer: Medicare Other | Source: Ambulatory Visit | Attending: Internal Medicine | Admitting: Internal Medicine

## 2019-01-26 ENCOUNTER — Other Ambulatory Visit: Payer: Self-pay

## 2019-01-26 DIAGNOSIS — J449 Chronic obstructive pulmonary disease, unspecified: Secondary | ICD-10-CM

## 2019-01-26 DIAGNOSIS — J1289 Other viral pneumonia: Secondary | ICD-10-CM | POA: Diagnosis not present

## 2019-01-26 DIAGNOSIS — U071 COVID-19: Secondary | ICD-10-CM | POA: Insufficient documentation

## 2019-01-26 DIAGNOSIS — J1282 Pneumonia due to coronavirus disease 2019: Secondary | ICD-10-CM

## 2019-01-26 NOTE — Assessment & Plan Note (Signed)
Gradually improving. Discussed progression of activity, maintenance of social distancing. Suggest retest in 1 week where she had the first one.

## 2019-01-26 NOTE — Assessment & Plan Note (Signed)
Recovering from recent Covid-19 pneumonia at [facility] w/o ventilator or home. Had overlapping inhalers, including ones from discharge. Plan- inhalers reviewed and instructions given per AVS.

## 2019-01-29 ENCOUNTER — Telehealth: Payer: Self-pay | Admitting: Internal Medicine

## 2019-01-29 NOTE — Telephone Encounter (Signed)
Recommend we send her for nasal swab for Covid. Nobody really knows how long people stay contagious- it varies.

## 2019-01-29 NOTE — Telephone Encounter (Signed)
I previously spoke w/ pt regarding her CXR results from 01/25/2019. Pt is calling inquiring if she is still contagious in regards to her previous COVID-19 infection. Pt was seen in the office 01/25/2019. Over the phone, pt states her symptoms have been resolving since she was in the hospital. She states her cough has subsided, however, that her GERD is causing some issues with her breathing. She states she hardly ate in the hospital and when she got home she ate various unhealthy foods that caused her reflux.   Routing to [facility] for follow-up. CY, please advise with your recommendations for this pt. Thank you.

## 2019-01-29 NOTE — Telephone Encounter (Signed)
Called and spoke w/ pt regarding CY's recommendations. Pt verbalized understanding and stated she would be going to a [place] testing site to have the nasal swab for COVID done, as it is more convenient  for her than one of the select [place] sites. I let her know that would be fine and to keep [place] updated. Pt expressed understanding. Nothing further needed at this time.

## 2019-01-31 ENCOUNTER — Inpatient Hospital Stay: Payer: Medicare Other | Admitting: Internal Medicine

## 2019-02-01 ENCOUNTER — Telehealth: Payer: Self-pay | Admitting: Gastroenterology

## 2019-02-01 ENCOUNTER — Ambulatory Visit: Payer: Medicare Other | Admitting: Internal Medicine

## 2019-02-01 MED ORDER — HYOSCYAMINE SULFATE 0.125 MG SL SUBL: SUBLINGUAL_TABLET | SUBLINGUAL | 1 refills | Status: DC

## 2019-02-01 NOTE — Telephone Encounter (Signed)
Sent in refills for patient and called her to inform her she needs a follow up before any further refills

## 2019-02-01 NOTE — Telephone Encounter (Signed)
Dr Fuller Plan please advise if patient can have this refill  Seen in 2019

## 2019-02-01 NOTE — Telephone Encounter (Signed)
Levsin 0.125 mg po q4h prn abdominal pain, #60, 1 refill  Schedule REV for additional refills

## 2019-02-02 ENCOUNTER — Telehealth: Payer: Self-pay | Admitting: Internal Medicine

## 2019-02-02 NOTE — Telephone Encounter (Signed)
Called the patient and advised her of Dr. Janee Morn response. The patient stated that she called our office because she was tested at [facility] [employer] on 01/30/19 by a company named [employer] who called her today and told her she tested positive for COVID-19. Patient stated her husband was going to get the antibody test. I told her that he needs to contact his PCP to let them know she tested positive as the antibody test may not be an accurate result for him since she just tested positive Tuesday.  I asked the patient to contact [employer] to get a copy of the results so the information can be scanned into her chart. Patient voiced understanding.  Patient advised of precautions and self-isolation. Advised the patient the message will be sent to Dr. Annamaria Boots for any additional instructions.  Dr. Annamaria Boots, please see above and advise. Thank you much.

## 2019-02-02 NOTE — Telephone Encounter (Signed)
I think she should try to self quarantine for another couple of weeks, and be very protective of other people if she has to go out.

## 2019-02-02 NOTE — Telephone Encounter (Signed)
Called spoke with patient. Shared Dr. Janee Morn rec with her. She is going to fax her positive results when she is able to. Fax number given to patient.  Nothing further needed at this time.

## 2019-02-02 NOTE — Telephone Encounter (Signed)
As we had told her on 8/17 call, we can't tell how long she will be contagious- it varies. The left over pneumonia shadows on her chest xray don't necessarily mean she is still contagious. That is why we asked her on the 17th to go back and get another nasal swab test done to see is she is still shedding virus.  Im glad she is getting better. If she does have to go out, she needs to wear a mask, wash her hands, and stay as much away from other people as she can.

## 2019-02-02 NOTE — Telephone Encounter (Signed)
Spoke with pt, she states she is concerned about being contagious since her xray from 8/14 showed she still had pockets of pneumonia. CY please advise if she is contagious and how long she should wait to go out.   Current Outpatient Medications on File Prior to Visit  Medication Sig Dispense Refill  . albuterol (PROVENTIL HFA;VENTOLIN HFA) 108 (90 Base) MCG/ACT inhaler Inhale 2 puffs into the lungs every 6 (six) hours as needed for wheezing or shortness of breath. 1 Inhaler 6  . calcium-vitamin D (OSCAL WITH D) 250-125 MG-UNIT tablet Take 1 tablet by mouth daily.    . clidinium-chlordiazePOXIDE (LIBRAX) 5-2.5 MG capsule TAKE 1 CAPSULE BY MOUTH THREE TIMES A DAY AS NEEDED 270 capsule 0  . clopidogrel (PLAVIX) 75 MG tablet Take 1 tablet (75 mg total) by mouth daily. 90 tablet 3  . COMBIVENT RESPIMAT 20-100 MCG/ACT AERS respimat TAKE 1 PUFF BY MOUTH TWICE A DAY 1 Inhaler 3  . docusate sodium (COLACE) 100 MG capsule Take 100 mg by mouth 2 (two) times daily.    Marland Kitchen escitalopram (LEXAPRO) 5 MG tablet Take 1 tablet (5 mg total) by mouth daily. 90 tablet 3  . ferrous sulfate 325 (65 FE) MG EC tablet Take 325 mg by mouth every other day.     . hyoscyamine (LEVSIN SL) 0.125 MG SL tablet DISSOVE 1 TABLET UNDER TONGUE EVERY 4 (FOUR) HOURS AS NEEDED FOR CRAMPING. 60 tablet 1  . methylphenidate (RITALIN) 10 MG tablet Take one pill po up to three times a day 90 tablet 0  . mometasone-formoterol (DULERA) 100-5 MCG/ACT AERO Inhale 2 puffs into the lungs daily.     . ondansetron (ZOFRAN) 8 MG tablet Take 1 tablet (8 mg total) by mouth every 8 (eight) hours as needed for nausea or vomiting. 5 tablet 0  . pantoprazole (PROTONIX) 40 MG tablet TAKE 1 TABLET BY MOUTH TWO  TIMES DAILY 180 tablet 0  . predniSONE (DELTASONE) 10 MG tablet TAKE 1 TABLET (10 MG TOTAL) BY MOUTH DAILY WITH BREAKFAST. 30 tablet 6  . Spacer/Aero-Holding Chambers (AEROCHAMBER MV) inhaler Use as instructed 1 each 0   No current facility-administered  medications on file prior to visit.    Allergies  Allergen Reactions  . Aspirin Shortness Of Breath

## 2019-02-07 ENCOUNTER — Other Ambulatory Visit: Payer: Self-pay | Admitting: Neurology

## 2019-02-07 MED ORDER — METHYLPHENIDATE HCL 10 MG PO TABS: ORAL_TABLET | ORAL | 0 refills | Status: DC

## 2019-02-07 NOTE — Telephone Encounter (Signed)
Pt is needing a refill on her methylphenidate (RITALIN) 10 MG tablet sent to the [facility] on [address]

## 2019-02-07 NOTE — Telephone Encounter (Signed)
Checked drug registry. She last refilled Ritalin 10/28/18 #90. Last seen 09/20/2018 and next f/u 03/27/2019  She received Tylenol #3 from [facility] pulmonology) 01/01/19 #30. Appears to have been a one time fill for headache post covid-19 infection

## 2019-02-16 ENCOUNTER — Telehealth: Payer: Self-pay | Admitting: Gastroenterology

## 2019-02-16 MED ORDER — PANTOPRAZOLE SODIUM 40 MG PO TBEC: 40.0000 mg | DELAYED_RELEASE_TABLET | Freq: Two times a day (BID) | ORAL | 0 refills | Status: DC

## 2019-02-16 NOTE — Telephone Encounter (Signed)
Informed patient she is over due for follow up visit and that's why we did not fill her pantoprazole. Patient states she was going to schedule a follow up visit but she is Covid-19 positive. Patient states she will get a negative test before she makes a follow up visit. Informed patient that we will fill her medication for now until she is recovered from the virus.

## 2019-02-22 ENCOUNTER — Other Ambulatory Visit: Payer: Self-pay

## 2019-03-12 ENCOUNTER — Telehealth: Payer: Self-pay | Admitting: Internal Medicine

## 2019-03-12 DIAGNOSIS — R0602 Shortness of breath: Secondary | ICD-10-CM

## 2019-03-12 DIAGNOSIS — Z8616 Personal history of COVID-19: Secondary | ICD-10-CM

## 2019-03-12 DIAGNOSIS — Z8619 Personal history of other infectious and parasitic diseases: Secondary | ICD-10-CM

## 2019-03-12 NOTE — Telephone Encounter (Signed)
Left message for patient to call back  

## 2019-03-13 ENCOUNTER — Ambulatory Visit: Payer: Medicare Other

## 2019-03-13 NOTE — Telephone Encounter (Signed)
Called pt and advised message from the provider. Pt understood and verbalized understanding. Nothing further is needed.   Pt states she is not SOB and doesn't have any Covid symptoms like she had in the past. CXR ordered stat.

## 2019-03-13 NOTE — Telephone Encounter (Signed)
Called and spoke with Patient.  Patient stated she is having occasional increased respirations, and hot breath. Patient stated she has never felt like this and is concerned that it may be pneumonia.  Patient stated it started 2 weeks ago. Patient denies fever, sob, cough, congestion, sore throat. Patient stated she has been taking her medications as prescribed. Patient stated she had some nausea today. Patient requested recommendations from Dr. Annamaria Boots.  Allergies  Allergen Reactions  . Aspirin Shortness Of Breath   Current Outpatient Medications on File Prior to Visit  Medication Sig Dispense Refill  . albuterol (PROVENTIL HFA;VENTOLIN HFA) 108 (90 Base) MCG/ACT inhaler Inhale 2 puffs into the lungs every 6 (six) hours as needed for wheezing or shortness of breath. 1 Inhaler 6  . calcium-vitamin D (OSCAL WITH D) 250-125 MG-UNIT tablet Take 1 tablet by mouth daily.    . clidinium-chlordiazePOXIDE (LIBRAX) 5-2.5 MG capsule TAKE 1 CAPSULE BY MOUTH THREE TIMES A DAY AS NEEDED 270 capsule 0  . clopidogrel (PLAVIX) 75 MG tablet Take 1 tablet (75 mg total) by mouth daily. 90 tablet 3  . COMBIVENT RESPIMAT 20-100 MCG/ACT AERS respimat TAKE 1 PUFF BY MOUTH TWICE A DAY 1 Inhaler 3  . docusate sodium (COLACE) 100 MG capsule Take 100 mg by mouth 2 (two) times daily.    Marland Kitchen escitalopram (LEXAPRO) 5 MG tablet Take 1 tablet (5 mg total) by mouth daily. 90 tablet 3  . ferrous sulfate 325 (65 FE) MG EC tablet Take 325 mg by mouth every other day.     . hyoscyamine (LEVSIN SL) 0.125 MG SL tablet DISSOVE 1 TABLET UNDER TONGUE EVERY 4 (FOUR) HOURS AS NEEDED FOR CRAMPING. 60 tablet 1  . methylphenidate (RITALIN) 10 MG tablet Take one pill po up to three times a day 90 tablet 0  . mometasone-formoterol (DULERA) 100-5 MCG/ACT AERO Inhale 2 puffs into the lungs daily.     . ondansetron (ZOFRAN) 8 MG tablet Take 1 tablet (8 mg total) by mouth every 8 (eight) hours as needed for nausea or vomiting. 5 tablet 0  .  pantoprazole (PROTONIX) 40 MG tablet Take 1 tablet (40 mg total) by mouth 2 (two) times daily. 180 tablet 0  . predniSONE (DELTASONE) 10 MG tablet TAKE 1 TABLET (10 MG TOTAL) BY MOUTH DAILY WITH BREAKFAST. 30 tablet 6  . Spacer/Aero-Holding Chambers (AEROCHAMBER MV) inhaler Use as instructed 1 each 0   No current facility-administered medications on file prior to visit.    Message routed to Dr. Annamaria Boots to advise

## 2019-03-13 NOTE — Telephone Encounter (Signed)
Suggest order CXR    Dx  Dyspnea, history of Covid infection.

## 2019-03-14 ENCOUNTER — Ambulatory Visit (INDEPENDENT_AMBULATORY_CARE_PROVIDER_SITE_OTHER): Payer: Medicare Other

## 2019-03-14 DIAGNOSIS — R0602 Shortness of breath: Secondary | ICD-10-CM

## 2019-03-14 DIAGNOSIS — Z8619 Personal history of other infectious and parasitic diseases: Secondary | ICD-10-CM

## 2019-03-15 ENCOUNTER — Encounter: Payer: Self-pay | Admitting: Pulmonary Disease

## 2019-03-15 ENCOUNTER — Telehealth (INDEPENDENT_AMBULATORY_CARE_PROVIDER_SITE_OTHER): Payer: Medicare Other | Admitting: Pulmonary Disease

## 2019-03-15 ENCOUNTER — Telehealth: Payer: Self-pay | Admitting: Internal Medicine

## 2019-03-15 DIAGNOSIS — U071 COVID-19: Secondary | ICD-10-CM

## 2019-03-15 DIAGNOSIS — J1289 Other viral pneumonia: Secondary | ICD-10-CM

## 2019-03-15 NOTE — Patient Instructions (Addendum)
You were seen today by Lauraine Rinne, NP  for:   1. Pneumonia due to COVID-19 virus  Restart your Nasacort nasal spray Contact Dr. Erik Obey with a ENT  I will discuss your case with Dr. Annamaria Boots to see if he would like for [place] to order pulmonary function testing to further evaluate your breathing, this would be a test that compares to your February/2020 breathing test.  For right now we will plan on repeating a chest x-ray in 6 to 8 weeks.  If we continue to have improved aeration on chest x-ray then we can follow this with imaging.  If symptoms worsen likely will need to consider a CT of your chest.  Continue to increase your physical activity  I would recommend purchasing a pulse oximeter to monitor your oxygen saturations at home.  Continue your Dulera inhaler Continue Combivent as needed   Follow Up:    Return in about 4 weeks (around 04/12/2019), or if symptoms worsen or fail to improve, for Follow up with Dr. Annamaria Boots.   Please do your part to reduce the spread of COVID-19:      Reduce your risk of any infection  and COVID19 by using the similar precautions used for avoiding the common cold or flu:  Marland Kitchen Wash your hands often with soap and warm water for at least 20 seconds.  If soap and water are not readily available, use an alcohol-based hand sanitizer with at least 60% alcohol.  . If coughing or sneezing, cover your mouth and nose by coughing or sneezing into the elbow areas of your shirt or coat, into a tissue or into your sleeve (not your hands). Langley Gauss A MASK when in public  . Avoid shaking hands with others and consider head nods or verbal greetings only. . Avoid touching your eyes, nose, or mouth with unwashed hands.  . Avoid close contact with people who are sick. . Avoid places or events with large numbers of people in one location, like concerts or sporting events. . If you have some symptoms but not all symptoms, continue to monitor at home and seek medical attention if  your symptoms worsen. . If you are having a medical emergency, call 911.   [place] / e-Visit: [email or web address]         [facility]: [facility]: [id number]                   [facility]: [id number]     It is flu season:   >>> Best ways to protect herself from the flu: Receive the yearly flu vaccine, practice good hand hygiene washing with soap and also using hand sanitizer when available, eat a nutritious meals, get adequate rest, hydrate appropriately   Please contact the office if your symptoms worsen or you have concerns that you are not improving.   Thank you for choosing [facility] for your healthcare, and for allowing [place] to partner with you on your healthcare journey. I am thankful to be able to provide care to you today.   Wyn Quaker FNP-C

## 2019-03-15 NOTE — Assessment & Plan Note (Addendum)
Chest x-ray from 03/14/2019 showing some improvement with bilateral opacities.  Questionable scarring.  Reviewed with patient over the appointment today.  Unsure what to make of patient's symptoms she is reporting today reporting that she is breathing more through her mouth and she does not know why.  She denies any sort of acute or worsening symptoms.  Denies wheezing.  Denies nasal congestion.  Denies sinus pressure.  She does not feel that she is having a nasal polyp.  She just feels that she is happening to breathe more through her mouth ever since having COVID and she does not know why.  Not sure what to fully make of this.  Suspect there could be with her history and nasal obstruction or potentially nasal polyps.  I have instructed her to resume her Nasacort nasal spray.  Continue her 10 mg of prednisone.  Ultimately this is difficult to fully assess telephonically.  Patient needs in person evaluation.  Plan: [facility] Follow-up with our office in 2 to 4 weeks for an in person evaluation If patient symptoms do not improve then likely patient will need high-resolution CT scan to further evaluate lungs. Tentatively will plan to do repeat chest x-ray in 6 to 8 weeks May need repeat pulmonary function testing to compare to February/2020 PFT results

## 2019-03-15 NOTE — Telephone Encounter (Signed)
Called and spoke to pt. Appt made with CY on 04/17/2019 at 1130. Pt verbalized understanding and denied any further questions or concerns at this time.

## 2019-03-15 NOTE — Telephone Encounter (Signed)
Yes to held spot

## 2019-03-15 NOTE — Progress Notes (Signed)
Virtual Visit via Video Note  I connected with BLANKA LUTTMAN on 03/15/19 at  3:00 PM EDT by a video enabled telemedicine application and verified that I am speaking with the correct person using two identifiers.  Location: Patient: Home Provider: Office - [facility] - [id number], [address], [place], [place] [address]  I discussed the limitations of evaluation and management by telemedicine and the availability of in person appointments. The patient expressed understanding and agreed to proceed. I also discussed with the patient that there may be a patient responsible charge related to this service. The patient expressed understanding and agreed to proceed.  Patient consented to consult via telephone: Yes People present and their role in pt care: Pt   History of Present Illness: 68 year old female former smoker followed in our office for asthma/COPD, allergic rhinitis, history of nasal polyps, recurrent sinusitis  Past medical history: Aspirin allergy, GERD Smoking history: Former smoker. Quit 1990. 10 pack year smoker.  Maintenance: Dulera 100, prednisone 10mg   Patient of Dr. Annamaria Boots  Chief complaint: Breathing more through her mouth  68 year old female former smoker followed in our office for asthma with COPD, allergic rhinitis as well as a history of nasal polyps and history of recurrent sinusitis.  Unfortunately patient was also diagnosed earlier this year with SARS-CoV-2.  She was hospitalized in July/2020 with COVID-19 pneumonia.  This is still visible on chest x-rays.  Chest x-ray completed on 03/14/2019 showed improved aeration slightly.  Also now showing potential scarring.  Patient contacted our office letting us know that she feels that she has been breathing more through her mouth lately.  She is unsure if this is related to COVID.  She denies worsened shortness of breath, worsened wheezing, nasal congestion, cough, sinus pressure or pain.  She feels that she is breathing  less through her nose and when she does breathe through her nose she does not feel she is getting enough oxygen.  She does not have a pulse oximeter at home to check her oxygen.  She does have an incentive spirometer which she is still achieving 2250 on.  She reports adherence to her [facility] inhaler.  Her Combivent.  She also continues to take 10 mg of prednisone daily.  Patient has not followed up with Dr. Erik Obey her ENT regarding her difficulty breathing through her nose.  She has Nasacort at home which she has not been using.  Observations/Objective:  Office Spirometry 06/01/12- moderate obstructive airways disease. FVC 3.15/85%, FEV1 1.87/65%, FEV1/FVC 0.59% FEF 25-75% 0.92/36% PFT 02/01/08- we compared her old PFT-mild obstructive airways disease with minimal response to bronchodilator. FVC 4.14/107%, FEV1 2.83/98%, FEV1/FVC 0.68, FEF 25-75% 1.72/56%. Scores have declined. PFT 07/20/2012-moderate obstructive airways disease with response to bronchodilator, normal lung volumes, diffusion mildly reduced. FVC 3.87/107%, FEV1 2.37/88%, FEV1/FVC 0.61, FEF 25-75% 1.12/39%. TLC 104%, DLCO 71%. 6MWT 07/20/2012-98%, 97%, 98%, 444 m. Noted hip pain. No oxygen limitation. Echocardiogram 08/03/2012-EF 0000000, gr 1 diastolic dysfunction. No pulmonary hypertension or wall motion abnormality FeNO-12/18/15- 13 PFT 08/03/2018-FVC 3.25/87%, FEV1 2.29/80%, ratio 0.70, FEF 25-75% 1.39/59%, NSC with dilator, TLC 98%, DLCO 88%  02/05/2019- SARS-CoV-2-positive  01/26/2019-chest x-ray-bilateral reticular nodular opacities in the periphery of lungs compatible with given history of COVID pneumonia  03/14/2019-chest x-ray-mildly improved bilateral peripheral interstitial lung opacities are noted suggesting improving pneumonia or postinfectious scarring  Assessment and Plan:  Discussion: This is very difficult to differentiate telephonically.  Patient is reporting no worsened acute symptoms, no shortness of breath, no  fatigue just  simply that she is breathing more into her mouth.  She is concerned this is related to COVID 19 which she was recently diagnosed with earlier this year.  I explained to her that this very much could be related to her having COVID-19 but difficult to fully say especially without a physical assessment.  It is reassuring that her chest x-ray showing improved aeration.  It is showing some postinfectious scarring potentially which we may need to get a better imaging picture on such as a CT in the future.  We also may need to consider repeating pulmonary function testing if she starts to develop more symptoms.  We will keep a close eye on her and have her follow-up with [place] in the next 2 to 4 weeks in our office for physical assessment.  I have also encouraged her to follow-up with the ENT to notify that she feels like she is not breathing in through her nose well.  She will also resume Nasacort.  Pneumonia due to COVID-19 virus Chest x-ray from 03/14/2019 showing some improvement with bilateral opacities.  Questionable scarring.  Reviewed with patient over the appointment today.  Unsure what to make of patient's symptoms she is reporting today reporting that she is breathing more through her mouth and she does not know why.  She denies any sort of acute or worsening symptoms.  Denies wheezing.  Denies nasal congestion.  Denies sinus pressure.  She does not feel that she is having a nasal polyp.  She just feels that she is happening to breathe more through her mouth ever since having COVID and she does not know why.  Not sure what to fully make of this.  Suspect there could be with her history and nasal obstruction or potentially nasal polyps.  I have instructed her to resume her Nasacort nasal spray.  Continue her 10 mg of prednisone.  Ultimately this is difficult to fully assess telephonically.  Patient needs in person evaluation.  Plan: [facility] Follow-up with our office in  2 to 4 weeks for an in person evaluation If patient symptoms do not improve then likely patient will need high-resolution CT scan to further evaluate lungs. Tentatively will plan to do repeat chest x-ray in 6 to 8 weeks May need repeat pulmonary function testing to compare to February/2020 PFT results   Follow Up Instructions:  Return in about 4 weeks (around 04/12/2019), or if symptoms worsen or fail to improve, for Follow up with Dr. Annamaria Boots.   I discussed the assessment and treatment plan with the patient. The patient was provided an opportunity to ask questions and all were answered. The patient agreed with the plan and demonstrated an understanding of the instructions.   The patient was advised to call back or seek an in-person evaluation if the symptoms worsen or if the condition fails to improve as anticipated.  I provided 26 minutes of non-face-to-face time during this encounter.   Lauraine Rinne, NP

## 2019-03-15 NOTE — Telephone Encounter (Signed)
Patient was seen today by Aaron Edelman for a televisit. He would like for the patient to follow up in 4 weeks with Dr. Annamaria Boots.   Dr. Annamaria Boots, are you ok with [place] using one of your held spots? Thank you!

## 2019-03-27 ENCOUNTER — Ambulatory Visit: Payer: Medicare Other | Admitting: Neurology

## 2019-03-27 ENCOUNTER — Encounter: Payer: Self-pay | Admitting: Neurology

## 2019-03-28 ENCOUNTER — Ambulatory Visit: Payer: Medicare Other | Admitting: Gastroenterology

## 2019-03-28 ENCOUNTER — Encounter: Payer: Self-pay | Admitting: Gastroenterology

## 2019-03-28 VITALS — BP 126/74 | HR 84 | Temp 97.8°F | Ht 69.0 in | Wt 168.6 lb

## 2019-03-28 DIAGNOSIS — K219 Gastro-esophageal reflux disease without esophagitis: Secondary | ICD-10-CM

## 2019-03-28 DIAGNOSIS — R1310 Dysphagia, unspecified: Secondary | ICD-10-CM | POA: Diagnosis not present

## 2019-03-28 NOTE — Patient Instructions (Signed)
Patient advised to avoid spicy, acidic, citrus, chocolate, mints, fruit and fruit juices.  Limit the intake of caffeine, alcohol and Soda.  Don't exercise too soon after eating.  Don't lie down within 3-4 hours of eating.  Elevate the head of your bed.  You have been scheduled for a Barium Esophogram at [facility] (1st floor of the hospital) on 04/03/19 at 11:00am. Please arrive 15 minutes prior to your appointment for registration. Make certain not to have anything to eat or drink 3 hours prior to your test. If you need to reschedule for any reason, please contact radiology at [phone] to do so. __________________________________________________________________ A barium swallow is an examination that concentrates on views of the esophagus. This tends to be a double contrast exam (barium and two liquids which, when combined, create a gas to distend the wall of the oesophagus) or single contrast (non-ionic iodine based). The study is usually tailored to your symptoms so a good history is essential. Attention is paid during the study to the form, structure and configuration of the esophagus, looking for functional disorders (such as aspiration, dysphagia, achalasia, motility and reflux) EXAMINATION You may be asked to change into a gown, depending on the type of swallow being performed. A radiologist and radiographer will perform the procedure. The radiologist will advise you of the type of contrast selected for your procedure and direct you during the exam. You will be asked to stand, sit or lie in several different positions and to hold a small amount of fluid in your mouth before being asked to swallow while the imaging is performed .In some instances you may be asked to swallow barium coated marshmallows to assess the motility of a solid food bolus. The exam can be recorded as a digital or video fluoroscopy procedure. POST PROCEDURE It will take 1-2 days for the barium to pass through your  system. To facilitate this, it is important, unless otherwise directed, to increase your fluids for the next 24-48hrs and to resume your normal diet.  This test typically takes about 30 minutes to perform. __________________________________________________________________  Thank you for choosing me and [facility].  Pricilla Riffle. Dagoberto Ligas., MD., Mar[facility] Regal

## 2019-03-28 NOTE — Progress Notes (Signed)
    History of Present Illness: This is a 68 year old female diagnosed with Covid-19 in July and hospitalized.  Her breathing problems have substantially resolved and she has had recent follow-up with her pulmonologist.  CXR is improved.  Shortly after hospitalization she developed worsening reflux symptoms and intermittent solid food dysphagia.  She has readjusted her diet and regularly takes pantoprazole twice daily however she has frequent breakthrough systems and persistent intermittent solid food dysphagia.  EGD performed in February 2014 showed a small hiatal hernia was otherwise normal.  Current Medications, Allergies, Past Medical History, Past Surgical History, Family History and Social History were reviewed in [employer] record.   Physical Exam: General: Well developed, well nourished, no acute distress Head: Normocephalic and atraumatic Eyes:  sclerae anicteric, EOMI Ears: Normal auditory acuity Mouth: No deformity or lesions Lungs: Clear throughout to auscultation Heart: Regular rate and rhythm; no murmurs, rubs or bruits Abdomen: Soft, non tender and non distended. No masses, hepatosplenomegaly or hernias noted. Normal Bowel sounds Rectal: Not done Musculoskeletal: Symmetrical with no gross deformities  Pulses:  Normal pulses noted Extremities: No clubbing, cyanosis, edema or deformities noted Neurological: Alert oriented x 4, grossly nonfocal Psychological:  Alert and cooperative. Normal mood and affect   Assessment and Recommendations:  1.  GERD and solid food dysphagia.  Rule out esophagitis, stricture, less likely neoplasm.  Recommended barium esophagram and EGD.  She declines EGD at this time but states she will reconsider based on her symptoms and findings on barium esophagram.  Continue pantoprazole 40 mg twice daily and follow all standard antireflux measures. REV in 6 weeks.

## 2019-04-03 ENCOUNTER — Other Ambulatory Visit: Payer: Self-pay

## 2019-04-03 ENCOUNTER — Ambulatory Visit (HOSPITAL_COMMUNITY)
Admission: RE | Admit: 2019-04-03 | Discharge: 2019-04-03 | Disposition: A | Discharge status: Home / Self Care | Payer: Medicare Other | Source: Ambulatory Visit | Attending: Gastroenterology | Admitting: Gastroenterology

## 2019-04-03 DIAGNOSIS — R1310 Dysphagia, unspecified: Secondary | ICD-10-CM

## 2019-04-03 DIAGNOSIS — K219 Gastro-esophageal reflux disease without esophagitis: Secondary | ICD-10-CM | POA: Diagnosis present

## 2019-04-03 MED ORDER — SUCRALFATE 1 GM/10ML PO SUSP: 1.0000 g | Freq: Four times a day (QID) | ORAL | 1 refills | Status: DC | PRN

## 2019-04-05 ENCOUNTER — Telehealth: Payer: Self-pay | Admitting: Gastroenterology

## 2019-04-05 NOTE — Telephone Encounter (Signed)
Discussed with patient Dr Silvio Pate recommendations because carafte is too expensive.  She has been using Gaviscon which is  Helping and will try it before she goes to bed at night

## 2019-04-05 NOTE — Telephone Encounter (Signed)
Pt states that carafate is over $100, she wants to know if there is something more affordable. She mentioned that generic is also expensive so she would like to know how long she needs to be on this medication. Pls call her.

## 2019-04-05 NOTE — Telephone Encounter (Signed)
Dr Fuller Plan Please advise

## 2019-04-05 NOTE — Telephone Encounter (Signed)
See 10/20 esophagram notes. Carafate prescribed per patient request for a liquid medication. Mylanta or similar OTC liquid antacid qid prn. Only need to take if it helps her symptoms.

## 2019-04-06 ENCOUNTER — Other Ambulatory Visit: Payer: Self-pay | Admitting: Gastroenterology

## 2019-04-16 DIAGNOSIS — N760 Acute vaginitis: Secondary | ICD-10-CM | POA: Insufficient documentation

## 2019-04-16 DIAGNOSIS — N951 Menopausal and female climacteric states: Secondary | ICD-10-CM | POA: Insufficient documentation

## 2019-04-17 ENCOUNTER — Ambulatory Visit: Payer: Medicare Other | Admitting: Internal Medicine

## 2019-04-17 ENCOUNTER — Other Ambulatory Visit: Payer: Self-pay

## 2019-04-17 ENCOUNTER — Ambulatory Visit (INDEPENDENT_AMBULATORY_CARE_PROVIDER_SITE_OTHER)
Admission: RE | Admit: 2019-04-17 | Discharge: 2019-04-17 | Disposition: A | Discharge status: Home / Self Care | Payer: Medicare Other | Source: Ambulatory Visit | Attending: Internal Medicine | Admitting: Internal Medicine

## 2019-04-17 ENCOUNTER — Encounter: Payer: Self-pay | Admitting: Internal Medicine

## 2019-04-17 VITALS — BP 108/80 | HR 100 | Temp 97.6°F | Ht 69.0 in | Wt 167.2 lb

## 2019-04-17 DIAGNOSIS — J1289 Other viral pneumonia: Secondary | ICD-10-CM

## 2019-04-17 DIAGNOSIS — K2100 Gastro-esophageal reflux disease with esophagitis, without bleeding: Secondary | ICD-10-CM | POA: Diagnosis not present

## 2019-04-17 DIAGNOSIS — J301 Allergic rhinitis due to pollen: Secondary | ICD-10-CM

## 2019-04-17 DIAGNOSIS — U071 COVID-19: Secondary | ICD-10-CM

## 2019-04-17 DIAGNOSIS — J1282 Pneumonia due to coronavirus disease 2019: Secondary | ICD-10-CM

## 2019-04-17 NOTE — Assessment & Plan Note (Signed)
Distal esophagitis indicated by Barium Swallow 04/03/2019. Plan- f/u with GI. Reflux precautions

## 2019-04-17 NOTE — Patient Instructions (Signed)
Order- CXR   Dx Covid pneumonia, resolving  Suggest- try using otc Breathe Right strips. See if they make it a little easier to breathe through your nose.   Please call if we can help

## 2019-04-17 NOTE — Progress Notes (Signed)
Subjective:    Patient ID: [name] [name], female    DOB: [birth year], 68 y.o.   MRN: [id number]  HPI  female former smoker followed for asthma/COPD, allergic rhinitis/history nasal polyps/aspirin allergy, recurrent sinusitis, complicated by GERD Office Spirometry 06/01/12- moderate obstructive airways disease. FVC 3.15/85%, FEV1 1.87/65%, FEV1/FVC 0.59% FEF 25-75% 0.92/36% PFT 02/01/08- we compared her old PFT-mild obstructive airways disease with minimal response to bronchodilator. FVC 4.14/107%, FEV1 2.83/98%, FEV1/FVC 0.68, FEF 25-75% 1.72/56%. Scores have declined. PFT 07/20/2012-moderate obstructive airways disease with response to bronchodilator, normal lung volumes, diffusion mildly reduced. FVC 3.87/107%, FEV1 2.37/88%, FEV1/FVC 0.61, FEF 25-75% 1.12/39%. TLC 104%, DLCO 71%. 6MWT 07/20/2012-98%, 97%, 98%, 444 m. Noted hip pain. No oxygen limitation. Echocardiogram 08/03/2012-EF 0000000, gr 1 diastolic dysfunction. No pulmonary hypertension or wall motion abnormality FeNO-12/18/15- 13 PFT 08/03/2018-FVC 3.25/87%, FEV1 2.29/80%, ratio 0.70, FEF 25-75% 1.39/59%, NSC with dilator, TLC 98%, DLCO 88% --------------------------------------------------------------------------------------------------------------   01/25/2019- 68 year old female former smoker followed for asthma/COPD, allergic rhinitis/history nasal polyps/aspirin allergy, recurrent sinusitis, complicated by GERD -----pt reports varying cough and DOE, recently in hosp for COVID-19 +, Albuterol hfa, CombiventRespimat, Dulera 100, Prednisone 10 mg daily,  Here with husband. After being symptomatic for 2-3 weeks, she had covid testing + in [place]. Got more weak and SOB and was directed to [facility]-hosp several days on O2, no vent. Discharged on room air. Slowly better with dry cough, weak, no fever/ chills. Discussed progression of activity, continued exposure precautions.  She brought several inhaler- reviewed. Doesn't use nebulizer- even  Xopenex neb said to trigger panic attacks.   04/17/2019- 68 year old female former smoker followed for asthma/COPD, allergic rhinitis/history nasal polyps/aspirin allergy, recurrent sinusitis, complicated by GERD Recent televist for complaint that she was breathing more through her mouth. Covid pneumonia hosp this summer with delayed clearing CXR. -----f/u 4 week  Back to normal breathing except more aware of mouth breathing. Doing saline rinse and nasacort. Not really congested, no sinus pain or drainage. May be related to increased work of breathing when active.  Had flu vax. She is awaiting return of her call to Dr Erik Obey. SARS swab positive 9/10- Covid pneumonia> hosp. CXR 03/14/2019- Mildly improved bilateral peripheral interstitial lung opacities are noted suggesting improving pneumonia or postinfectious scarring. Barium Swallow 04/03/2019- IMPRESSION: 1. Small type 1 hiatal hernia. 2. Widely patent distal esophageal mucosal ring at 1.9 cm in diameter. 3. Feline esophagus appearance with mild distal esophageal fold thickening compatible with distal esophagitis. No ulceration Identified.  ROS-see HPI   + = positive Constitutional:    weight loss, night sweats, fevers, chills, fatigue, lassitude. HEENT:    headaches, difficulty swallowing, tooth/dental problems, sore throat,       sneezing, itching, ear ache, +nasal congestion, post nasal drip, snoring CV:    chest pain, orthopnea, PND, swelling in lower extremities, anasarca,                                                   dizziness, palpitations Resp:  + shortness of breath with exertion or at rest.                productive cough, +  non-productive cough, coughing up of blood.              change in color of mucus.  wheezing.   Skin:    rash or  lesions. GI:  No-   heartburn, indigestion, abdominal pain, nausea, vomiting,  GU:  MS:   joint pain, stiffness,  Neuro-     nothing unusual Psych:  change in mood or affect.   depression or anxiety.   memory loss.  OBJ- Physical Exam General- Alert, Oriented, Affect-appropriate, Distress- none acute Skin- rash-none, lesions- none, excoriation- none Lymphadenopathy- none Head- atraumatic            Eyes- Gross vision intact, PERRLA, conjunctivae and secretions clear            Ears- Hearing, canals-normal            Nose- not- stuffy, no-Septal dev, mucus, polyps, erosion, perforation             Throat- Mallampati III , mucosa clear/not dry , drainage- none, tonsils- atrophic                    + dentures, Neck- flexible , trachea midline, no stridor , thyroid nl, carotid no bruit Chest - symmetrical excursion , unlabored           Heart/CV- RRR , no murmur , no gallop  , no rub, nl s1 s2                           - JVD- none , edema- none, stasis changes- none, varices- none           Lung- clear to P&A, wheeze- none, cough +,  dullness-none, rub- none           Chest wall-  Abd-  Br/ Gen/ Rectal- Not done, not indicated Extrem- cyanosis- none, clubbing, none, atrophy- none, strength- nl Neuro- grossly intact to observation

## 2019-04-17 NOTE — Assessment & Plan Note (Signed)
Clinically resolved with possible residual DOE. Plan- CXR

## 2019-04-17 NOTE — Assessment & Plan Note (Signed)
Her comments suggest some increase work of breathing or nasal stuffiness. Plan- Breathe Right strips

## 2019-04-20 ENCOUNTER — Telehealth: Payer: Self-pay | Admitting: Gastroenterology

## 2019-04-20 NOTE — Telephone Encounter (Signed)
Patient states she started taking Carafate two days ago on a empty stomach and has felt nauseous for 2 days. Patient states she would like to stop taking it. Informed patient to stop taking Carafate and call back with worsening symptoms. Patient verbalized understanding.

## 2019-04-25 ENCOUNTER — Other Ambulatory Visit: Payer: Self-pay | Admitting: Internal Medicine

## 2019-04-25 MED ORDER — CILIDINIUM-CHLORDIAZEPOXIDE 2.5-5 MG PO CAPS: 1.0000 | ORAL_CAPSULE | Freq: Three times a day (TID) | ORAL | 2 refills | Status: DC | PRN

## 2019-05-08 ENCOUNTER — Other Ambulatory Visit: Payer: Self-pay

## 2019-05-08 ENCOUNTER — Encounter: Payer: Self-pay | Admitting: Primary Care

## 2019-05-08 ENCOUNTER — Ambulatory Visit (INDEPENDENT_AMBULATORY_CARE_PROVIDER_SITE_OTHER): Payer: Medicare Other | Admitting: Primary Care

## 2019-05-08 DIAGNOSIS — U071 COVID-19: Secondary | ICD-10-CM

## 2019-05-08 DIAGNOSIS — J1282 Pneumonia due to coronavirus disease 2019: Secondary | ICD-10-CM

## 2019-05-08 DIAGNOSIS — J1289 Other viral pneumonia: Secondary | ICD-10-CM

## 2019-05-08 MED ORDER — PREDNISONE 10 MG PO TABS: ORAL_TABLET | ORAL | 0 refills | Status: DC

## 2019-05-08 NOTE — Addendum Note (Signed)
Addended by: Nena Polio on: 05/08/2019 02:42 PM   Modules accepted: Orders

## 2019-05-08 NOTE — Progress Notes (Signed)
Virtual Visit via Telephone Note  I connected with [name] [name] on 05/08/19 at  2:00 PM EST by telephone and verified that I am speaking with the correct person using two identifiers.  Location: Patient: Home Provider: Home   I discussed the limitations, risks, security and privacy concerns of performing an evaluation and management service by telephone and the availability of in person appointments. I also discussed with the patient that there may be a patient responsible charge related to this service. The patient expressed understanding and agreed to proceed.   History of Present Illness: 68 year old female, former smoker. PMH significant for asthma/COPD, pneumonia d/t covid-19, nasal polyps, recurrent sinusitis, GERD, esophageal stricture, osteoporosis. Patient of Dr. Annamaria Boots, last seen on 04/17/19. Tested positive for COVID in July 2020. CXR in September showed mildly improved bilateral peripheral interstitial lung opacities suggesting improving pneumonia or postinfectious scarring. Maintained on Dulera, combivent and prednisone 10mg  daily.   05/08/2019 Patient contacted today for acute televisit. Noticed shortness of breath on exertion since last week. Associated chest tightness and GERD symptoms following with GI. She got short winded moving some light furniture. She remains active and continues to be able to do all of her ADLs. Using incentive spirometer daily, states that she is getting all the way to the top. Repeat COVID testing negative in September. Denies fever, chills, cough.    Observations/Objective:  - No shortness of breath, wheezing or cough noted during phone call   Office Spirometry 06/01/12- moderate obstructive airways disease. FVC 3.15/85%, FEV1 1.87/65%, FEV1/FVC 0.59% FEF 25-75% 0.92/36% PFT 02/01/08- we compared her old PFT-mild obstructive airways disease with minimal response to bronchodilator. FVC 4.14/107%, FEV1 2.83/98%, FEV1/FVC 0.68, FEF 25-75% 1.72/56%.  Scores have declined. PFT 07/20/2012-moderate obstructive airways disease with response to bronchodilator, normal lung volumes, diffusion mildly reduced. FVC 3.87/107%, FEV1 2.37/88%, FEV1/FVC 0.61, FEF 25-75% 1.12/39%. TLC 104%, DLCO 71%. 6MWT 07/20/2012-98%, 97%, 98%, 444 m. Noted hip pain. No oxygen limitation. Echocardiogram 08/03/2012-EF 0000000, gr 1 diastolic dysfunction. No pulmonary hypertension or wall motion abnormality FeNO-12/18/15- 13 PFT 08/03/2018-FVC 3.25/87%, FEV1 2.29/80%, ratio 0.70, FEF 25-75% 1.39/59%, NSC with dilator, TLC 98%, DLCO 88%  02/05/2019- SARS-CoV-2-positive  01/26/2019-chest x-ray-bilateral reticular nodular opacities in the periphery of lungs compatible with given history of COVID pneumonia  03/14/2019-chest x-ray-mildly improved bilateral peripheral interstitial lung opacities are noted suggesting improving pneumonia or postinfectious scarring  Assessment and Plan:  Pneumonia d/t covid-19: - Experiencing dyspnea on exertion  - Needs HRCT to monitor scarring seen on CXR - Recommend patient stay active and continue to use IS - FU in office with 6MWT and will need repeat PFTs down the line  COPD/asthma - Acute exacerbation - RX prednisone taper as directed and then resume 10mg  daily - Continue Dulera and combivent twice daily  Follow Up Instructions:   - 4 weeks with Dr. Annamaria Boots and 6MWT  I discussed the assessment and treatment plan with the patient. The patient was provided an opportunity to ask questions and all were answered. The patient agreed with the plan and demonstrated an understanding of the instructions.   The patient was advised to call back or seek an in-person evaluation if the symptoms worsen or if the condition fails to improve as anticipated.  I provided 22 minutes of non-face-to-face time during this encounter.   Martyn Ehrich, NP

## 2019-05-08 NOTE — Patient Instructions (Signed)
  Pneumonia d/t covid-19: - Needs HRCT to monitor scarring seen on CXR - Recommend patient stay active and continue to use IS - FU in office with 6MWT and will need repeat PFTs down the line  COPD/asthma - Acute exacerbation - RX prednisone taper as directed and then resume 10mg  daily - Continue Dulera and combivent twice daily  Follow-up: 4 weeks with 6MWT DR. Young

## 2019-05-15 ENCOUNTER — Ambulatory Visit (INDEPENDENT_AMBULATORY_CARE_PROVIDER_SITE_OTHER): Payer: Medicare Other

## 2019-05-15 ENCOUNTER — Other Ambulatory Visit: Payer: Self-pay | Admitting: Neurology

## 2019-05-15 ENCOUNTER — Other Ambulatory Visit: Payer: Self-pay

## 2019-05-15 DIAGNOSIS — J1289 Other viral pneumonia: Secondary | ICD-10-CM | POA: Diagnosis not present

## 2019-05-15 DIAGNOSIS — J1282 Pneumonia due to coronavirus disease 2019: Secondary | ICD-10-CM

## 2019-05-15 DIAGNOSIS — U071 COVID-19: Secondary | ICD-10-CM | POA: Diagnosis not present

## 2019-05-15 NOTE — Telephone Encounter (Signed)
Pt is needing a refill on her methylphenidate (RITALIN) 10 MG tablet sent to the [facility] on [address]

## 2019-05-16 ENCOUNTER — Encounter: Payer: Self-pay | Admitting: Gastroenterology

## 2019-05-16 ENCOUNTER — Ambulatory Visit: Payer: Medicare Other | Admitting: Gastroenterology

## 2019-05-16 VITALS — BP 112/64 | HR 74 | Temp 98.6°F | Ht 69.0 in | Wt 162.2 lb

## 2019-05-16 DIAGNOSIS — R112 Nausea with vomiting, unspecified: Secondary | ICD-10-CM

## 2019-05-16 DIAGNOSIS — K219 Gastro-esophageal reflux disease without esophagitis: Secondary | ICD-10-CM | POA: Diagnosis not present

## 2019-05-16 DIAGNOSIS — R1013 Epigastric pain: Secondary | ICD-10-CM | POA: Diagnosis not present

## 2019-05-16 MED ORDER — METHYLPHENIDATE HCL 10 MG PO TABS: ORAL_TABLET | ORAL | 0 refills | Status: DC

## 2019-05-16 NOTE — Progress Notes (Signed)
    History of Present Illness: This is a 68 year old female with intemittent post abdominal pain, GERD,  infrequent nausea and vomiting.  Her dysphagia has resolved.  She notes intermittent epigastric crampy pain following some meals.  Seemingly occurs randomly.  She had symptoms after a large meal at Thanksgiving.  She has episodes of nausea and vomiting that occurred every several months of unclear etiology.  She states a antiemetic suppository she has at home has not been effective and we do not have that medication on her list.  She does not recall the name.  Barium esophagram 03/2019 IMPRESSION: 1. Small type 1 hiatal hernia. 2. Widely patent distal esophageal mucosal ring at 1.9 cm in diameter. 3. Feline esophagus appearance with mild distal esophageal fold thickening compatible with distal esophagitis. No ulceration identified.  Current Medications, Allergies, Past Medical History, Past Surgical History, Family History and Social History were reviewed in [employer] record.   Physical Exam: General: Well developed, well nourished, no acute distress Head: Normocephalic and atraumatic Eyes:  sclerae anicteric, EOMI Ears: Normal auditory acuity Mouth: No deformity or lesions Lungs: Clear throughout to auscultation Heart: Regular rate and rhythm; no murmurs, rubs or bruits Abdomen: Soft, non tender and non distended. No masses, hepatosplenomegaly or hernias noted. Normal Bowel sounds Rectal: Not done Musculoskeletal: Symmetrical with no gross deformities  Pulses:  Normal pulses noted Extremities: No clubbing, cyanosis, edema or deformities noted Neurological: Alert oriented x 4, grossly nonfocal Psychological:  Alert and cooperative. Normal mood and affect   Assessment add Recommendations:  1. GERD, frequent post prandial epigastric pain, infrequent N/V.  Continue pantoprazole 40 mg twice daily.  Closely follow all antireflux measures, Gaviscon as needed.   Levsin 1-2 before meals as needed and every 4 hours as needed.  Patient is advised to contact us with her antiemetic suppository so we can update her medication list and offer an alternative antiemetic suppository.  2.  Personal history of adenomatous colon polyps.  Tubulovillous adenoma in 2010.  Surveillance colonoscopy recommended in April 2022.

## 2019-05-16 NOTE — Patient Instructions (Addendum)
Call our office back and let us know which suppository is not helping so we can prescribe a different one.   You can take hysocamine 1-2 capsules before meals as well.   Thank you for choosing me and [facility].  Pricilla Riffle. Dagoberto Ligas., MD., Mar[facility] Regal

## 2019-05-17 ENCOUNTER — Telehealth: Payer: Self-pay | Admitting: Gastroenterology

## 2019-05-17 MED ORDER — PROCHLORPERAZINE 25 MG RE SUPP: 25.0000 mg | Freq: Two times a day (BID) | RECTAL | 2 refills | Status: DC | PRN

## 2019-05-17 NOTE — Telephone Encounter (Signed)
Carafate tablets 1 g po tid prn, #90, 2 refills

## 2019-05-17 NOTE — Telephone Encounter (Signed)
Patient also wanted to know if she can have Carafate tablets in the place of the suspension?

## 2019-05-17 NOTE — Telephone Encounter (Signed)
Compazine 25 mg supp, 1 PR bid prn N/V, #12, 2 refills

## 2019-05-17 NOTE — Telephone Encounter (Signed)
Patient is calling back from her appointment yesterday to let Dr. Fuller Plan know which suppository she is taking and is not helping. The name of it is called promethazine and it is 25 mg. She also stated she had a few other questions.

## 2019-05-17 NOTE — Telephone Encounter (Signed)
Patient is calling back to let us know the medication she previously used was phenergan 25 mg suppository. Patient states this did not help with nausea and would like something else prescribed. Patient also wanted to know if she can try Carafate tablets. Patient was previously prescribed Carafate suspension and had severe nausea but thinks she would like to try the tablets to see if they have the same effect on her. Please advise.

## 2019-05-18 MED ORDER — SUCRALFATE 1 G PO TABS: 1.0000 g | ORAL_TABLET | Freq: Three times a day (TID) | ORAL | 2 refills | Status: DC | PRN

## 2019-05-18 NOTE — Telephone Encounter (Signed)
Prescription sent to patient's pharmacy and patient notified.  

## 2019-05-23 NOTE — Progress Notes (Signed)
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 Dr. Vaughan Browner was wanting to follow some of these patient with serial scans. You are seeing her in 1 week.

## 2019-05-31 ENCOUNTER — Other Ambulatory Visit: Payer: Self-pay

## 2019-05-31 ENCOUNTER — Ambulatory Visit (INDEPENDENT_AMBULATORY_CARE_PROVIDER_SITE_OTHER): Payer: Medicare Other | Admitting: Internal Medicine

## 2019-05-31 ENCOUNTER — Encounter: Payer: Self-pay | Admitting: Internal Medicine

## 2019-05-31 DIAGNOSIS — U071 COVID-19: Secondary | ICD-10-CM | POA: Diagnosis not present

## 2019-05-31 DIAGNOSIS — J449 Chronic obstructive pulmonary disease, unspecified: Secondary | ICD-10-CM

## 2019-05-31 NOTE — Assessment & Plan Note (Signed)
Active infection is resolved. CT notes NSIP pattern, to watch over time.

## 2019-05-31 NOTE — Patient Instructions (Signed)
I think you have recovered well from your covid pneumonia.  We can continue current meds.  Please call if we can help

## 2019-05-31 NOTE — Assessment & Plan Note (Signed)
Now returning to baseline with mild residual symptoms currently, after covid pneumonia Fall, 2020. Plan- continue Dulera. Ok to try again to use 2 puffs BID through spacer. If thrush, go back to 1 puff BID.

## 2019-05-31 NOTE — Progress Notes (Signed)
Subjective:    Patient ID: [name] [name], female    DOB: 1951-04-17, 68 y.o.   MRN: [id number]  HPI  female former smoker followed for asthma/COPD, allergic rhinitis/history nasal polyps/aspirin allergy, recurrent sinusitis, complicated by GERD Office Spirometry 06/01/12- moderate obstructive airways disease. FVC 3.15/85%, FEV1 1.87/65%, FEV1/FVC 0.59% FEF 25-75% 0.92/36% PFT 02/01/08- we compared her old PFT-mild obstructive airways disease with minimal response to bronchodilator. FVC 4.14/107%, FEV1 2.83/98%, FEV1/FVC 0.68, FEF 25-75% 1.72/56%. Scores have declined. PFT 07/20/2012-moderate obstructive airways disease with response to bronchodilator, normal lung volumes, diffusion mildly reduced. FVC 3.87/107%, FEV1 2.37/88%, FEV1/FVC 0.61, FEF 25-75% 1.12/39%. TLC 104%, DLCO 71%. 6MWT 07/20/2012-98%, 97%, 98%, 444 m. Noted hip pain. No oxygen limitation. Echocardiogram 08/03/2012-EF 0000000, gr 1 diastolic dysfunction. No pulmonary hypertension or wall motion abnormality FeNO-12/18/15- 13 PFT 08/03/2018-FVC 3.25/87%, FEV1 2.29/80%, ratio 0.70, FEF 25-75% 1.39/59%, NSC with dilator, TLC 98%, DLCO 88% Covid pneumonia 2020- CTchest HR- 05/15/2019-  1. 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 Covid pneumonia hosp this summer with delayed clearing CXR. -----f/u 4 week  Back to normal breathing except more aware of mouth breathing. Doing saline rinse and nasacort. Not really congested, no sinus pain or drainage. May be related to increased work of breathing when active.   Had flu vax. She is awaiting return of her call to Dr Erik Obey. SARS swab positive 9/10- Covid pneumonia> hosp. CXR 03/14/2019- Mildly improved bilateral peripheral interstitial lung opacities are noted suggesting improving pneumonia or postinfectious scarring. Barium Swallow 04/03/2019- IMPRESSION: 1. Small type 1 hiatal hernia. 2. Widely patent distal esophageal mucosal ring at 1.9 cm in diameter. 3. Feline esophagus appearance with mild distal esophageal fold thickening compatible with distal esophagitis. No ulceration Identified.  05/31/2019- Virtual Visit via Telephone Note  I connected with [name] [name] on 05/31/19 at  1:30 PM EST by telephone and verified that I am speaking with the correct person using two identifiers.  Location: Patient: H Provider:O   I discussed the limitations, risks, security and privacy concerns of performing an evaluation and management service by telephone and the availability of in person appointments. I also discussed with the patient that there may be a patient responsible charge related to this service. The patient expressed understanding and agreed to proceed.   History of Present Illness: 68 year old female former smoker followed for asthma/COPD, allergic rhinitis/history nasal polyps/aspirin allergy, recurrent sinusitis, complicated by GERD, Covid pneumonia 2020/ NSIP,  Still self-aware of breathing through mouth- says exhaled breath is "hot"after she gets up in the morning, but denies fever. Little cough or wheeze. Continues Dulera 1 puff through aerochamber twice daily to minimize thrush.  Discussed CT chest. I reviewed images.    Observations/Objective: CTchest HR- 05/15/2019-  IMPRESSION: 1. 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 Findings are suggestive of an alternative diagnosis (not UIP) per consensus guidelines: Diagnosis of Idiopathic Pulmonary  Fibrosis: An Official ATS/ERS/JRS/ALAT Clinical Practice Guideline. [place], Iss 5, [id number], Feb 12 2017. 2. Mild patchy air trapping in both lungs, indicative of small airways disease. 3. One vessel coronary atherosclerosis. 4. Moderate hiatal hernia. Aortic Atherosclerosis (ICD10-I70.0).  Assessment and Plan: Covid pneumonia with post-inflammatory NSIP- plan 6 MWT next  ov Asthma/ COPD- returning to baseline  Follow Up Instructions: 6 months   I discussed the assessment and treatment plan with the patient. The patient was provided an opportunity to ask questions and all were answered. The patient agreed with the plan and demonstrated an understanding of the instructions.   The patient was advised to call back or seek an in-person evaluation if the symptoms worsen or if the condition fails to improve as anticipated.  I provided  16 minutes of non-face-to-face time during this encounter.   Baird Lyons, MD    ROS-see HPI   + = positive Constitutional:    weight loss, night sweats, fevers, chills, fatigue, lassitude. HEENT:    headaches, difficulty swallowing, tooth/dental problems, sore throat,       sneezing, itching, ear ache, +nasal congestion, post nasal drip, snoring CV:    chest pain, orthopnea, PND, swelling in lower extremities, anasarca,                                                   dizziness, palpitations Resp:  + shortness of breath with exertion or at rest.                productive cough, +  non-productive cough, coughing up of blood.              change in color of mucus.  wheezing.   Skin:    rash or lesions. GI:  No-   heartburn, indigestion, abdominal pain, nausea, vomiting,  GU:  MS:   joint pain, stiffness,  Neuro-     nothing unusual Psych:  change in mood or affect.  depression or anxiety.   memory loss.  OBJ- Physical Exam General- Alert, Oriented, Affect-appropriate, Distress- none acute Skin- rash-none, lesions- none,  excoriation- none Lymphadenopathy- none Head- atraumatic            Eyes- Gross vision intact, PERRLA, conjunctivae and secretions clear            Ears- Hearing, canals-normal            Nose- not- stuffy, no-Septal dev, mucus, polyps, erosion, perforation             Throat- Mallampati III , mucosa clear/not dry , drainage- none, tonsils- atrophic                    + dentures, Neck- flexible , trachea midline, no stridor , thyroid nl, carotid no bruit Chest - symmetrical excursion , unlabored           Heart/CV- RRR , no murmur , no gallop  , no rub, nl s1 s2                           - JVD- none , edema- none, stasis changes- none, varices- none           Lung- clear to P&A, wheeze- none, cough +,  dullness-none, rub- none           Chest wall-  Abd-  Br/ Gen/ Rectal- Not done, not indicated Extrem- cyanosis- none, clubbing, none, atrophy- none, strength- nl Neuro- grossly intact to observation

## 2019-06-04 ENCOUNTER — Telehealth: Payer: Self-pay | Admitting: Gastroenterology

## 2019-06-04 NOTE — Telephone Encounter (Signed)
Pt states that she is still having stomach issues. She states that after eating she gets severe cramping and sometimes diarrhea. She wants to know what she can do.

## 2019-06-04 NOTE — Telephone Encounter (Signed)
Left message for patient to call back with the gentleman that answered the phone.

## 2019-06-05 NOTE — Telephone Encounter (Signed)
Patient reports that she had an episode of IBS .  She reports all of her symptoms have resolved

## 2019-06-27 ENCOUNTER — Telehealth: Payer: Self-pay | Admitting: Internal Medicine

## 2019-06-27 DIAGNOSIS — R002 Palpitations: Secondary | ICD-10-CM

## 2019-06-27 NOTE — Telephone Encounter (Signed)
Called and spoke to pt. Pt states she has had a several episodes of heart pounding and some SOB over the last month. Pt states this can happen with activity or rest. Pt states today she walked up front the basement and the heart pounding and SOB occurred 'around the same time'. Another time pt was sitting on the couch and just the heart pounding occurred, denying SOB at that time. Pt states she feels it may be similar to a panic attack. Pt denies any current acute s/s. Pt requests the message be sent to Dr. Annamaria Boots to advise on any recs.   Dr. Annamaria Boots please advise. Thanks.

## 2019-06-27 NOTE — Telephone Encounter (Signed)
I called and spoke with the pt and notified of recs per Dr Annamaria Boots  She declined the referral  She does not think her heart is the issue  I advised to let us know if she changes her mind about referral

## 2019-06-27 NOTE — Telephone Encounter (Signed)
This feeling of palpitations can come from anxiety or sudden exertion. It can also be from a cardiac arrhythmia. I would suggest referral to cardiology for dx of palpitations. They can discuss it with her and consider if she could wear a portable heart monitor for a while to see if they can record it.

## 2019-06-27 NOTE — Telephone Encounter (Signed)
Spoke with patient. She stated that she had changed her mind in regards to the cardiology referral. She would like to establish with someone in [place] since she lives there. Referral has been placed to [facility], she is aware that they will contact her in regards to scheduling an appointment.   Nothing further needed at time of call.

## 2019-07-04 ENCOUNTER — Encounter: Payer: Self-pay | Admitting: Neurology

## 2019-07-04 ENCOUNTER — Telehealth: Payer: Self-pay | Admitting: *Deleted

## 2019-07-04 ENCOUNTER — Telehealth (INDEPENDENT_AMBULATORY_CARE_PROVIDER_SITE_OTHER): Payer: Medicare Other | Admitting: Neurology

## 2019-07-04 DIAGNOSIS — F418 Other specified anxiety disorders: Secondary | ICD-10-CM | POA: Diagnosis not present

## 2019-07-04 DIAGNOSIS — R413 Other amnesia: Secondary | ICD-10-CM

## 2019-07-04 DIAGNOSIS — R9082 White matter disease, unspecified: Secondary | ICD-10-CM | POA: Diagnosis not present

## 2019-07-04 DIAGNOSIS — R41840 Attention and concentration deficit: Secondary | ICD-10-CM | POA: Diagnosis not present

## 2019-07-04 MED ORDER — CLOPIDOGREL BISULFATE 75 MG PO TABS: 75.0000 mg | ORAL_TABLET | Freq: Every day | ORAL | 3 refills | Status: DC

## 2019-07-04 MED ORDER — ESCITALOPRAM OXALATE 5 MG PO TABS: 5.0000 mg | ORAL_TABLET | Freq: Every day | ORAL | 3 refills | Status: DC

## 2019-07-04 MED ORDER — METHYLPHENIDATE HCL 10 MG PO TABS: ORAL_TABLET | ORAL | 0 refills | Status: DC

## 2019-07-04 NOTE — Progress Notes (Signed)
[facility]  PATIENT: [name] [name] DOB: 11-25-1950  REFERRING DOCTOR OR PCP:  Ricke Hey SOURCE: patients and records form [facility] records  _________________________________   HISTORICAL  CHIEF COMPLAINT:  Chief Complaint  Patient presents with  . Memory Loss    HISTORY OF PRESENT ILLNESS:  [name] [name] is a 69 y.o. woman who reporting memory decline.   Update 07/04/19 Virtual Visit via Video Note I connected with [name] on 07/04/19 at  3:00 PM EST by a video enabled telemedicine application and verified that I am speaking with the correct person using two identifiers.   I discussed the limitations of evaluation and management by telemedicine and the availability of in person appointments. The patient expressed understanding and agreed to proceed.  History of Present Illness: She had Covid over the summer but is ok.  She was hospitalized x 5 days in July/Aug.  She also had COPD and she reports being told she had a pneumonia.     She was treated with a steroid.  She followed up with Dr. Annamaria Boots (pulmonary).  She is feeling her heart pounding some and is supposed to see cardiology.     She feels memory is doing about the same and has continued to do better since taking methylphenidate 10 mg daily.  She rarely a second pill.   She tries to keep her mind active with crafts and Sudoku and other games.     She felt a pop in her back after moving furniture.  She was recently placed on a mediation for osteoporosis but did not tolerate it well.  She was told she may have fractured a vertebrae (we don't have results of any recent spine imaging)  She feels mood is doing well.   She occasionally gets irritable.    She takes Lexapro 5 mg daily.  She denies any TIA or stroke symptoms.   She is on Plavix and tolerates it well.  I reviewed her last MRI of the brain.  It shows confluent white matter changes consistent with severe chronic microvascular  ischemic change.   She reports that her gait is stable,   No numbness or weakness.  Bladder is doing well.   Observations/Objective: She is a well-developed well-nourished woman in no acute distress.  Head is normocephalic and atraumatic.  Visible skin appears normal.  She is alert and fully oriented fluent speech and apparent good attention, knowledge and memory.  Extraocular muscles are intact.  Facial strength and trapezius strength appear normal.  Palatal elevation and tongue protrusion are midline.  Finger-nose-finger and rapid alternating movements are performed well.  Arm strength appears normal.  Sensation, reflexes and gait cannot be adequately assessed.  Assessment and Plan: Memory difficulties  White matter abnormality on MRI of brain  Attention deficit  Depression with anxiety  1.  She will continue Plavix.  I'll renew. 2.  Ritalin 10 mg up to 3 times a day as needed for focus/attention.  Renewed 3.  Stay active and exercise as tolerated. 4.   Renew Lexapro 5.  Return in 6 months or sooner if there are new or worsening neurologic symptoms.   Follow Up Instructions:    I discussed the assessment and treatment plan with the patient. The patient was provided an opportunity to ask questions and all were answered. The patient agreed with the plan and demonstrated an understanding of the instructions.   The patient was advised to call back or seek an in-person evaluation if  the symptoms worsen or if the condition fails to improve as anticipated.  I provided 75minutes of non-face-to-face time during this encounter.   Britt Bottom, MD  _____________________ From previous visits: Update 03/16/2018: She feels her memory is doing the same.   She notes problems with remembering what's been said in conversation.   She feels she does ok with names. She sometimes repeats herself.    She remains on Ritalin and thinks it may help her mildly.   She never started the donepezil  prescribed at the last visit  She has had some depression but stopped Prozac as she wondered if she needed the med for a while. However, as she reported feeling sad,  it was rewritten for her and she is taking 1/2 pill daily.    She is taking clopidogrel and Folbee and tolerates them well.   She has moderately severe chronic microvascular ischemic changes on CT.      She was having back pain radiating down her leg and was given a dosepak and is scheduled for MRI.    Update 09/14/2017:     She feels her short-term memory is doing about the same.  It has not worsened since the last visit.  She notes that when she forgets,  she usually remembers if she gets a hint.  She sometimes repeats herself.   She forgets where things are placed in the house.    She is not noting any difficulty driving. Ritalin seems to help her energy but she is not sure it helps memory much .    She tolerates it well and there have not been any complications.       Mood is doing about the same as the last visit. She feels down at times.  She gets frustrated easily, especially if she forgets something.  Fluoxetine may be helping a little bit.  There are no suicidal or homicidal ideations.  Although Ritalin helped her focus, it did not change the mood.  She sleeps well most nights getting about 6 hours.   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    She feels she is about the sane as last year or slightly worse but not much worse.    She does not get lost while driving and remembers to turn off appliances.  She doesn't always remember what she has just talked about in conversation.   This frustrates her when it occurs at work.    She usually feels that with hints she remembers what was forgotten.    She felt Ritalin helped her focus and attention.   She tolerates it well.  She feels she has a mild depression and sometimes gets frustrated easily..   She is on  fluoxetine.    She sleeps well most nights getting about 6 hours of sleep.   She will usually just take Ritalin twice a day because the third one sometimes caused insomnia.  ______________________________- From 02/08/2016 Short term memory issues are her biggest concern.    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  E doesn't remember to make lists most days.  She uses some reminders Holiday representative notes)   She denies any difficulty doing math.  She has not become lost while driving. She has not had difficulty remembering to turn appliances off.   Attention and focus are poor.  Ritalin and feels it helped her focus but not her memory.      Her mother was in her 37s when she had Alzheimer's disease. Her sister, brother and aunt all had strokes.     CT scan 12/10/2014 showed moderate to severe periventricular subcortical white matter changes consistent with chronic microvascular ischemia.    Mood:   She has had depression and gets frustrated easily.  She feels her husband hasn't been responsive enough to her problems.   She gets frustrated easily, especially at her husband.   She stopped escitalopram and had more mood swings.    Sertraline made her sleepy   Sleep:    She feels that she falls asleep and stays asleep well. Husband has not noted her snoring or gasping for air at night.   REVIEW OF SYSTEMS: Constitutional: No fevers, chills, sweats, or change in appetite.    Some fatigue Eyes: No visual changes, double vision, eye pain Ear, nose and throat: No hearing loss, ear pain, nasal congestion, sore throat Cardiovascular: No chest pain, palpitations Respiratory: reports shortness of breath and wheezes   No snores GastrointestinaI: No nausea, vomiting, diarrhea, abdominal pain, fecal incontinence Genitourinary: No dysuria, urinary retention or frequency.  No nocturia. Musculoskeletal: No neck pain, back pain but notes pain in the hips and knees. . Integumentary:  No rash, pruritus, skin lesions Neurological: as above Psychiatric: Notes depression at this time but no anxiety Endocrine: No palpitations, diaphoresis, change in appetite, change in weigh or increased thirst Hematologic/Lymphatic: No anemia, purpura, petechiae. Allergic/Immunologic: No itchy/runny eyes, nasal congestion,   She has some seasonal allergies.  ALLERGIES: Allergies  Allergen Reactions  . Aspirin Shortness Of Breath    HOME MEDICATIONS:  Current Outpatient Medications:  .  Acetaminophen-Codeine (TYLENOL WITH CODEINE #3 PO), Take by mouth., Disp: , Rfl:  .  clopidogrel (PLAVIX) 75 MG tablet, Take 1 tablet (75 mg total) by mouth daily., Disp: 90 tablet, Rfl: 3 .  COMBIVENT RESPIMAT 20-100 MCG/ACT AERS respimat, TAKE 1 PUFF BY MOUTH TWICE A DAY, Disp: 1 Inhaler, Rfl: 3 .  escitalopram (LEXAPRO) 5 MG tablet, Take 1 tablet (5 mg total) by mouth daily., Disp: 90 tablet, Rfl: 3 .  hyoscyamine (LEVSIN SL) 0.125 MG SL tablet, DISSOVE 1 TABLET UNDER TONGUE EVERY 4 (FOUR) HOURS AS NEEDED FOR CRAMPING., Disp: 60 tablet, Rfl: 1 .  methylphenidate (RITALIN) 10 MG tablet, Take one pill po up to three times a day, Disp: 90 tablet, Rfl: 0 .  mometasone-formoterol (DULERA) 100-5 MCG/ACT AERO, Inhale 2 puffs into the lungs daily. , Disp: , Rfl:  .  pantoprazole (PROTONIX) 40 MG tablet, TAKE 1 TABLET BY MOUTH  TWICE DAILY, Disp: 180 tablet, Rfl: 3 .  predniSONE (DELTASONE) 10 MG tablet, Take 4 tabs po daily x 2 days; then 3 tabs for 2 days; then 2 tabs for 2 days; then 1 tab for 2 days (Patient taking differently: Take 10 mg by mouth daily. ), Disp: 20 tablet, Rfl: 0 .  prochlorperazine (COMPAZINE) 25 MG suppository, Place 1 suppository (25 mg total) rectally every 12 (twelve) hours as needed for nausea or vomiting., Disp: 12 suppository, Rfl: 2 .  Spacer/Aero-Holding Chambers (AEROCHAMBER MV) inhaler, Use as instructed, Disp: 1 each, Rfl: 0  PAST MEDICAL HISTORY: Past Medical History:    Diagnosis Date  . Allergic rhinitis, cause unspecified    failed allergy vaccine  . Allergy   . Anxiety    panic attacks  . Arthritis   . COPD (  chronic obstructive pulmonary disease) ([place])   . Depression   . Diverticulosis   . Extrinsic asthma, unspecified   . GERD (gastroesophageal reflux disease)   . Hiatal hernia   . Polyp of nasal cavity   . PONV (postoperative nausea and vomiting)   . S/P dilatation of esophageal stricture 08-2008  . 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 Unspecified sinusitis (chronic)   . Vision abnormalities     PAST SURGICAL HISTORY: Past Surgical History:  Procedure Laterality Date  . BUNIONECTOMY     left foot  . CHOLECYSTECTOMY  05/05/2012   Procedure: LAPAROSCOPIC CHOLECYSTECTOMY WITH INTRAOPERATIVE CHOLANGIOGRAM;  Surgeon: Gayland Curry, MD,FACS;  Location: [facility];  Service: General;  Laterality: N/A;  . COLONOSCOPY    . COLONOSCOPY W/ BIOPSIES AND POLYPECTOMY    . NASAL POLYP SURGERY     x 3   . POLYPECTOMY    . THUMB FUSION  10/13   rt hand  pinned but pin removed  . TONSILLECTOMY      FAMILY HISTORY: Family History  Problem Relation Age of Onset  . Colon cancer Maternal Grandfather        unsure age of onset   . Cancer Maternal Grandfather        colon  . Stroke Maternal Aunt   . Heart disease Father        Vague history  . Prostate cancer Brother   . Ataxia Daughter        died at age 70.5    SOCIAL HISTORY:  Social History   Socioeconomic History  . Marital status: Married    Spouse name: Not on file  . Number of children: 2  . Years of education: Not on file  . Highest education level: Not on file  Occupational History  . Occupation: Scientist, research (medical): [employer]  Tobacco Use  . Smoking status: Former Smoker    Packs/day: 0.50    Years: 20.00    Pack years: 10.00    Types: Cigarettes    Quit date: 06/14/1988    Years since quitting: 31.0  . Smokeless tobacco: Never Used   Substance and Sexual Activity  . Alcohol use: No  . Drug use: No  . Sexual activity: Yes    Birth control/protection: Post-menopausal  Other Topics Concern  . Not on file  Social History Narrative   Lives with husband.     Social Determinants of Health   Financial Resource Strain:   . Difficulty of Paying Living Expenses: Not on file  Food Insecurity:   . Worried About Charity fundraiser in the Last Year: Not on file  . Ran Out of Food in the Last Year: Not on file  Transportation Needs:   . Lack of Transportation (Medical): Not on file  . Lack of Transportation (Non-Medical): Not on file  Physical Activity:   . Days of Exercise per Week: Not on file  . Minutes of Exercise per Session: Not on file  Stress:   . Feeling of Stress : Not on file  Social Connections:   . Frequency of Communication with Friends and Family: Not on file  . Frequency of Social Gatherings with Friends and Family: Not on file  . Attends Religious Services: Not on file  . Active Member of Clubs or Organizations: Not on file  . Attends Archivist Meetings: Not on file  . Marital Status: Not on file  Intimate Partner Violence:   .  Fear of Current or Ex-Partner: Not on file  . Emotionally Abused: Not on file  . Physically Abused: Not on file  . Sexually Abused: Not on file         ASSESSMENT AND PLAN  Memory difficulties  White matter abnormality on MRI of brain  Attention deficit  Depression with anxiety  Glenda Kunst A. Felecia Shelling, MD, PhD [id number], [id number] PM Certified in Neurology, Clinical Neurophysiology, Sleep Medicine, Pain Medicine and Neuroimaging  [facility] [address] [place], [place] [address] [phone]

## 2019-07-04 NOTE — Telephone Encounter (Signed)
Called and spoke with pt. Scheduled 6 month f/u for 01/02/20 at 2:30pm with Dr. Felecia Shelling.

## 2019-07-04 NOTE — Telephone Encounter (Signed)
Called pt. Got her set up for mychart video visit. Her appt was listed as televist but she was not set up. She will call back if she could not get logged on to [place]. She has to change her password. I explained steps on how to start visit.  I updated med list/pharmacy/allergies on file.

## 2019-07-10 ENCOUNTER — Telehealth: Payer: Self-pay | Admitting: Internal Medicine

## 2019-07-10 MED ORDER — ALBUTEROL SULFATE HFA 108 (90 BASE) MCG/ACT IN AERS: 1.0000 | INHALATION_SPRAY | Freq: Four times a day (QID) | RESPIRATORY_TRACT | 3 refills | Status: DC | PRN

## 2019-07-10 MED ORDER — CILIDINIUM-CHLORDIAZEPOXIDE 2.5-5 MG PO CAPS: 1.0000 | ORAL_CAPSULE | Freq: Three times a day (TID) | ORAL | 3 refills | Status: DC | PRN

## 2019-07-10 MED ORDER — COMBIVENT RESPIMAT 20-100 MCG/ACT IN AERS: INHALATION_SPRAY | RESPIRATORY_TRACT | 3 refills | Status: DC

## 2019-07-10 MED ORDER — MOMETASONE FURO-FORMOTEROL FUM 100-5 MCG/ACT IN AERO: 2.0000 | INHALATION_SPRAY | Freq: Every day | RESPIRATORY_TRACT | 3 refills | Status: DC

## 2019-07-10 MED ORDER — PREDNISONE 10 MG PO TABS: 10.0000 mg | ORAL_TABLET | Freq: Every day | ORAL | 1 refills | Status: DC

## 2019-07-10 NOTE — Telephone Encounter (Signed)
Called and spoke with pt verifying with pt the meds she was needing to have refilled as well as the preferred pharmacy. All meds have been sent to preferred pharmacy for pt. Nothing further needed.

## 2019-07-10 NOTE — Addendum Note (Signed)
Addended by: Lorretta Harp on: 07/10/2019 03:52 PM   Modules accepted: Orders

## 2019-07-10 NOTE — Telephone Encounter (Signed)
Librax script e-sent

## 2019-07-10 NOTE — Addendum Note (Signed)
Addended by: Baird Lyons D on: 07/10/2019 04:45 PM   Modules accepted: Orders

## 2019-07-10 NOTE — Telephone Encounter (Signed)
With the librax rx, it keeps printing so seems like this one has to be sent in by MD. Dr. Annamaria Boots, please advise if you are okay refilling the librax rx for pt [facility].

## 2019-07-20 ENCOUNTER — Telehealth: Payer: Self-pay | Admitting: Internal Medicine

## 2019-07-20 MED ORDER — PREDNISONE 10 MG PO TABS: 10.0000 mg | ORAL_TABLET | Freq: Every day | ORAL | 1 refills | Status: DC

## 2019-07-20 MED ORDER — CILIDINIUM-CHLORDIAZEPOXIDE 2.5-5 MG PO CAPS: 1.0000 | ORAL_CAPSULE | Freq: Three times a day (TID) | ORAL | 3 refills | Status: DC | PRN

## 2019-07-20 MED ORDER — COMBIVENT RESPIMAT 20-100 MCG/ACT IN AERS: INHALATION_SPRAY | RESPIRATORY_TRACT | 3 refills | Status: DC

## 2019-07-20 MED ORDER — MOMETASONE FURO-FORMOTEROL FUM 100-5 MCG/ACT IN AERO: 2.0000 | INHALATION_SPRAY | Freq: Every day | RESPIRATORY_TRACT | 3 refills | Status: DC

## 2019-07-20 MED ORDER — ALBUTEROL SULFATE HFA 108 (90 BASE) MCG/ACT IN AERS: 1.0000 | INHALATION_SPRAY | Freq: Four times a day (QID) | RESPIRATORY_TRACT | 3 refills | Status: DC | PRN

## 2019-07-20 NOTE — Telephone Encounter (Signed)
Called [facility], [place], [place], where prescriptions were sent 07/10/19.  Prescriptions were received and pharmacy attempted to contact Patient, but unable to speak with Patient, and Patient never returned call. Called and spoke with Patient. Patient is stating [facility], [facility].. Patient gave number [phone]. Called [phone], which is [facility] mail order pharmacy. Requested prescriptions proair, prednisone, dulera, combivent sent to [facility]. Patient is also requesting Dr. Annamaria Boots send a prescription for Librax to [facility]. Patient aware prescriptions are being sent to [facility] not [facility].  Message routed to Dr. Annamaria Boots to advise on Librax prescription refill.   Allergies  Allergen Reactions  . Aspirin Shortness Of Breath   Current Outpatient Medications on File Prior to Visit  Medication Sig Dispense Refill  . Acetaminophen-Codeine (TYLENOL WITH CODEINE #3 PO) Take by mouth.    . clopidogrel (PLAVIX) 75 MG tablet Take 1 tablet (75 mg total) by mouth daily. 90 tablet 3  . escitalopram (LEXAPRO) 5 MG tablet Take 1 tablet (5 mg total) by mouth daily. 90 tablet 3  . hyoscyamine (LEVSIN SL) 0.125 MG SL tablet DISSOVE 1 TABLET UNDER TONGUE EVERY 4 (FOUR) HOURS AS NEEDED FOR CRAMPING. 60 tablet 1  . methylphenidate (RITALIN) 10 MG tablet Take one pill po up to three times a day 90 tablet 0  . mometasone-formoterol (DULERA) 100-5 MCG/ACT AERO Inhale 2 puffs into the lungs daily. 2 Inhaler 0  . pantoprazole (PROTONIX) 40 MG tablet TAKE 1 TABLET BY MOUTH  TWICE DAILY 180 tablet 3  . prochlorperazine (COMPAZINE) 25 MG suppository Place 1 suppository (25 mg total) rectally every 12 (twelve) hours as needed for nausea or vomiting. 12 suppository 2  . Spacer/Aero-Holding Chambers (AEROCHAMBER MV) inhaler Use as instructed 1 each 0   No current facility-administered medications on file prior to visit.

## 2019-07-20 NOTE — Telephone Encounter (Signed)
Pt calling back about her prescriptions being sent to the new pharmacy, she called in January with information for the new pharmacy. Pt states the insurance pharmacy has not received the scripts. pt can be reached at [phone].

## 2019-07-23 MED ORDER — CILIDINIUM-CHLORDIAZEPOXIDE 2.5-5 MG PO CAPS: 1.0000 | ORAL_CAPSULE | Freq: Three times a day (TID) | ORAL | 3 refills | Status: DC | PRN

## 2019-07-23 NOTE — Telephone Encounter (Signed)
Librax refill e-sent

## 2019-07-26 ENCOUNTER — Other Ambulatory Visit: Payer: Self-pay

## 2019-07-26 MED ORDER — PANTOPRAZOLE SODIUM 40 MG PO TBEC: 40.0000 mg | DELAYED_RELEASE_TABLET | Freq: Two times a day (BID) | ORAL | 2 refills | Status: DC

## 2019-08-03 ENCOUNTER — Other Ambulatory Visit: Payer: Self-pay

## 2019-08-03 MED ORDER — HYOSCYAMINE SULFATE 0.125 MG SL SUBL: SUBLINGUAL_TABLET | SUBLINGUAL | 1 refills | Status: DC

## 2019-08-13 ENCOUNTER — Telehealth: Payer: Self-pay

## 2019-08-13 MED ORDER — METHYLPHENIDATE HCL 10 MG PO TABS: ORAL_TABLET | ORAL | 0 refills | Status: DC

## 2019-08-13 NOTE — Telephone Encounter (Signed)
Pt called and left a VM asking for a refill on her ritalin. Pt is due for a refill and up to date on her appts. [facility] checked.

## 2019-08-14 NOTE — Telephone Encounter (Signed)
I called pharmacy. They had old rx on file from 07/04/19 #90 that they filled for pt for this month. Rx sent electronically yesterday will be placed on hold for next month. I called pt and relayed this info. She verbalized understanding. She will pick up refill from pharmacy. Nothing further needed.

## 2019-08-14 NOTE — Telephone Encounter (Signed)
Patient called stating she was advised by pharmacy she cant fill until the 28th but states she will be out before than

## 2019-08-17 ENCOUNTER — Other Ambulatory Visit: Payer: Self-pay | Admitting: Internal Medicine

## 2019-08-20 ENCOUNTER — Telehealth: Payer: Self-pay | Admitting: Gastroenterology

## 2019-08-20 NOTE — Telephone Encounter (Signed)
Left detailed message on her mobile number to call me back tomorrow to confirm that the pantoprazole is all she needs from [place].

## 2019-08-20 NOTE — Telephone Encounter (Signed)
I called and spoke with someone who said to call back in an hour. I told him it may be after 5 when I call back. I need to know if the pantoprazole is the only medicine she needs [place] to send to [facility].

## 2019-08-21 NOTE — Telephone Encounter (Signed)
I spoke with [name] [name] and told her I have put [facility] as one of her pharmacy's. She has plenty of her pantoprazole and hyoscyamine currently as they were just refilled last month. I told her to call when she gets low on her supply and we can send them to [facility] for her.

## 2019-09-24 ENCOUNTER — Telehealth: Payer: Self-pay

## 2019-09-24 NOTE — Telephone Encounter (Signed)
-----   Message from Marshell Garfinkel, MD sent at 09/22/2019  3:13 PM EDT ----- Regarding: RE: Covid pneumonia patient - HRCT changes Missed this message  Danae Chen- can you call to make an appointment.  Thanks Praveen ----- Message ----- From: Martyn Ehrich, NP Sent: 05/23/2019  10:14 AM EDT To: Marshell Garfinkel, MD Subject: Covid pneumonia patient - HRCT changes         Covid pneumonia patient has fibrosis changes on HRCT Dr. Annamaria Boots patient   Do you want to follow?  Where am I leaving their information again? Are we using teams, is there a shared document or just message you?  [phone]

## 2019-09-24 NOTE — Telephone Encounter (Signed)
I spoke with the patient and have scheduled her for 10/24/19.

## 2019-10-02 ENCOUNTER — Other Ambulatory Visit: Payer: Self-pay | Admitting: *Deleted

## 2019-10-02 ENCOUNTER — Telehealth: Payer: Self-pay | Admitting: Neurology

## 2019-10-02 MED ORDER — CLOPIDOGREL BISULFATE 75 MG PO TABS: 75.0000 mg | ORAL_TABLET | Freq: Every day | ORAL | 3 refills | Status: DC

## 2019-10-02 MED ORDER — ESCITALOPRAM OXALATE 5 MG PO TABS: 5.0000 mg | ORAL_TABLET | Freq: Every day | ORAL | 3 refills | Status: DC

## 2019-10-02 NOTE — Telephone Encounter (Signed)
E-scribed [facility]

## 2019-10-02 NOTE — Telephone Encounter (Signed)
Pt states she was advised by pharmacy updated prescriptions for her medications need to be sent over   clopidogrel (PLAVIX) 75 MG tablet   escitalopram (LEXAPRO) 5 MG tablet   Pharmacy:[facility] phone#[phone]

## 2019-10-24 ENCOUNTER — Other Ambulatory Visit: Payer: Self-pay

## 2019-10-24 ENCOUNTER — Ambulatory Visit: Payer: Medicare Other | Admitting: Pulmonary Disease

## 2019-10-24 ENCOUNTER — Encounter: Payer: Self-pay | Admitting: Pulmonary Disease

## 2019-10-24 VITALS — BP 120/84 | HR 84 | Temp 97.8°F | Ht 69.0 in | Wt 160.0 lb

## 2019-10-24 DIAGNOSIS — U071 COVID-19: Secondary | ICD-10-CM

## 2019-10-24 DIAGNOSIS — J1282 Pneumonia due to coronavirus disease 2019: Secondary | ICD-10-CM | POA: Diagnosis not present

## 2019-10-24 NOTE — Progress Notes (Signed)
[name] [name]    BE:[id number]    10/07/1950  Primary Care Physician:Brooks, Barnie Del, MD  Referring Physician: [place], [place] [id number] [place],  [place] [address]  Chief complaint: Consult for post COVID-32  HPI: 69 year old with history of COPD, allergies, GERD, nasal polyps with aspirin allergy  Hospitalized with COVID-19 in July 2020 for 5 days at [facility].  Patient does not recall the therapy she received.  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  Continues on Dulera, albuterol as needed and prednisone 10 mg States that overall she is doing well and back to baseline.  Outpatient Encounter Medications as of 10/24/2019  Medication Sig  . Acetaminophen-Codeine (TYLENOL WITH CODEINE #3 PO) Take by mouth.  Marland Kitchen albuterol (PROAIR HFA) 108 (90 Base) MCG/ACT inhaler Inhale 1-2 puffs into the lungs every 6 (six) hours as needed for wheezing or shortness of breath.  . clidinium-chlordiazePOXIDE (LIBRAX) 5-2.5 MG capsule Take 1 capsule by mouth 3 (three) times daily as needed.  . clopidogrel (PLAVIX) 75 MG tablet Take 1 tablet (75 mg total) by mouth daily.  Marland Kitchen escitalopram (LEXAPRO) 5 MG tablet Take 1 tablet (5 mg total) by mouth daily.  . hyoscyamine (LEVSIN SL) 0.125 MG SL tablet DISSOVE 1 TABLET UNDER TONGUE EVERY 4 (FOUR) HOURS AS NEEDED FOR CRAMPING.  Marland Kitchen Ipratropium-Albuterol (COMBIVENT RESPIMAT) 20-100 MCG/ACT AERS respimat TAKE 1 PUFF BY MOUTH TWICE A DAY  . methylphenidate (RITALIN) 10 MG tablet Take one pill po up to three times a day  . mometasone-formoterol (DULERA) 100-5 MCG/ACT AERO Inhale 2 puffs into the lungs daily.  . pantoprazole (PROTONIX) 40 MG tablet Take 1 tablet (40 mg total) by mouth 2 (two) times daily.  . predniSONE (DELTASONE) 10 MG tablet Take 1 tablet (10 mg total) by mouth daily.  . prochlorperazine (COMPAZINE) 25 MG  suppository Place 1 suppository (25 mg total) rectally every 12 (twelve) hours as needed for nausea or vomiting.  Marland Kitchen Spacer/Aero-Holding Chambers (AEROCHAMBER MV) inhaler Use as instructed  . [DISCONTINUED] mometasone-formoterol (DULERA) 100-5 MCG/ACT AERO Inhale 2 puffs into the lungs daily.   No facility-administered encounter medications on file as of 10/24/2019.   Physical Exam: Blood pressure 120/84, pulse 84, temperature 97.8 F (36.6 C), temperature source Temporal, height 5\' 9"  (1.753 m), weight 160 lb (72.6 kg), SpO2 98 %. Gen:      No acute distress HEENT:  EOMI, sclera anicteric Neck:     No masses; no thyromegaly Lungs:    Clear to auscultation bilaterally; normal respiratory effort CV:         Regular rate and rhythm; no murmurs Abd:      + bowel sounds; soft, non-tender; no palpable masses, no distension Ext:    No edema; adequate peripheral perfusion Skin:      Warm and dry; no rash Neuro: alert and oriented x 3 Psych: normal mood and affect  Data Reviewed: Imaging: HRCT 05/15/2019-patchy reticulation, groundglass opacities without traction bronchiectasis.  Mild air trapping Alternate pattern, NSIP.  I have reviewed the images personally.  PFTs: 08/03/2018 FVC 3.25 [87%], FEV1 2.29 [80%], F/F 70, TLC 5.72 [98%], DLCO 20.53 [88%] Mild obstruction, normal lung volumes and diffusion capacity.  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 Annamaria Boots.  Marshell Garfinkel MD [facility] 10/24/2019, 2:07 PM  CC: Gemma Payor, MD

## 2019-10-24 NOTE — Patient Instructions (Signed)
Glad you are doing well with your breathing I have reviewed the CT which shows very mild scarring from COVID-19 pneumonia We will get an function test to reevaluate your lung and see if there is any change after your hospitalization Continue inhalers as prescribed Follow-up as scheduled with Dr. Annamaria Boots.

## 2019-11-01 ENCOUNTER — Telehealth: Payer: Self-pay | Admitting: Gastroenterology

## 2019-11-01 MED ORDER — PROMETHAZINE HCL 25 MG RE SUPP: 25.0000 mg | Freq: Two times a day (BID) | RECTAL | 0 refills | Status: DC | PRN

## 2019-11-01 NOTE — Telephone Encounter (Signed)
Called patient with no answer and no voicemail to leave a message. 

## 2019-11-01 NOTE — Telephone Encounter (Signed)
I have sent a prescription to her local pharmacy for a small number of phenergan suppositories.  It should be noted that the 05/17/19 phone note indicates that the patient stated this medicine had NOT been helpful for her in the past.  Since she has apparently changed her mind in that regard, I sent the prescription.  Please be clear with her that she should NOT use both compazine and phenergan suppositories.  Dr. Fuller Plan is only off the afternoon and can address this further when he returns to the office tomorrow.

## 2019-11-01 NOTE — Telephone Encounter (Signed)
Patient states she woke up this morning with intense nausea. She used a compazine suppository that normally resolves her nausea. She states this time it did not and it has been several hours with no relief. Patient states she cannot take any oral medications for nausea because she will vomit them up. Patient would like a alternative suppository. She has used phenergan suppositories in the past and would like to know if we can prescribe them again. Please advise Dr. Loletha Carrow, Doc of the day.

## 2019-11-01 NOTE — Telephone Encounter (Signed)
Pt stated that Dr. Fuller Plan had prescribed suppositories for nausea but she is still experiencing nausea.

## 2019-11-02 NOTE — Telephone Encounter (Signed)
Informed patient that a prescription for phenergan suppositories have been sent to the pharmacy. Also, informed patient that she told us in 05/17/19 that she thought the phenergan suppositories did not help. Patient states the phenergan suppositories last year were outdated and that she thought that that could be a reason why they are not helping control her nausea. Informed patient I will put that in her chart. Informed patient to not use both suppositories at the same time which patient verbalized understanding.

## 2019-11-16 ENCOUNTER — Other Ambulatory Visit: Payer: Self-pay | Admitting: Neurology

## 2019-11-16 NOTE — Telephone Encounter (Signed)
Pt is calling for a refill on her methylphenidate (RITALIN) 10 MG tablet to [id number]

## 2019-11-19 MED ORDER — METHYLPHENIDATE HCL 10 MG PO TABS: ORAL_TABLET | ORAL | 0 refills | Status: DC

## 2019-11-19 NOTE — Addendum Note (Signed)
Addended by: Wyvonnia Lora on: 11/19/2019 07:35 AM   Modules accepted: Orders

## 2019-11-29 ENCOUNTER — Ambulatory Visit: Payer: Medicare Other | Admitting: Internal Medicine

## 2019-12-04 ENCOUNTER — Telehealth: Payer: Self-pay | Admitting: Internal Medicine

## 2019-12-05 NOTE — Telephone Encounter (Signed)
I'm not sure why the PA hasn;t been done before now but [facility] [place] is not in network with the patient's insurance. I have called and spoke with Bonnita Nasuti she is cancelling the CT appt. I have LVM for patient to call be back so I can explain that her CT will need to be done at [facility]

## 2019-12-06 ENCOUNTER — Other Ambulatory Visit: Payer: Medicare Other

## 2019-12-07 NOTE — Telephone Encounter (Signed)
Patient's CT has been rescheduled at [facility] on 12/12/19 @ 3:50pm Patient is aware of the appt and Auth has been obtained

## 2019-12-11 ENCOUNTER — Other Ambulatory Visit (HOSPITAL_COMMUNITY): Payer: Medicare Other

## 2019-12-14 ENCOUNTER — Ambulatory Visit: Payer: Medicare Other | Admitting: Internal Medicine

## 2020-01-02 ENCOUNTER — Ambulatory Visit: Payer: Medicare Other | Admitting: Neurology

## 2020-01-02 ENCOUNTER — Encounter: Payer: Self-pay | Admitting: Neurology

## 2020-01-02 ENCOUNTER — Other Ambulatory Visit: Payer: Self-pay

## 2020-01-02 VITALS — BP 133/88 | HR 80 | Ht 69.0 in | Wt 157.5 lb

## 2020-01-02 DIAGNOSIS — R7989 Other specified abnormal findings of blood chemistry: Secondary | ICD-10-CM

## 2020-01-02 DIAGNOSIS — R413 Other amnesia: Secondary | ICD-10-CM

## 2020-01-02 DIAGNOSIS — R9082 White matter disease, unspecified: Secondary | ICD-10-CM

## 2020-01-02 DIAGNOSIS — R41840 Attention and concentration deficit: Secondary | ICD-10-CM

## 2020-01-02 DIAGNOSIS — F418 Other specified anxiety disorders: Secondary | ICD-10-CM

## 2020-01-02 NOTE — Progress Notes (Signed)
[facility]  PATIENT: [name] [name] DOB: [birth year]  REFERRING DOCTOR OR PCP:  Ricke Hey SOURCE: patients and records form [facility] records  _________________________________   HISTORICAL  CHIEF COMPLAINT:  Chief Complaint  Patient presents with   Follow-up    RM 12, alone. Last seen 07/04/2019. Takes plavix for hx TIA. No new sx.     HISTORY OF PRESENT ILLNESS:  [name] [name] is a 69 y.o. woman who reporting memory decline.   Update 01/02/2020: She feels memory is doing about the same.   She gets some benefit from methylphenidate 10 mg daily.  She rarely a second pill.   She tries to keep her mind active with crafts and Sudoku and other games.   She was working - Medical sales representative.   She feels mood is doing well.   She occasionally gets irritable.    She takes Lexapro 5 mg daily.  Her husband has irritability and anger issues.   She denies any TIA or stroke symptoms.   She is on Plavix and tolerates it well.  MRI of the brain shows confluent white matter changes consistent with severe chronic microvascular ischemic change.   She reports that her gait is stable,   No numbness or weakness.  Bladder is doing well.  She had Covid summer 2020 and was hospitalized x 5 days in July/Aug.  She did not need ventilation but due to COPD and pneumonia needed to be observed.   Vascular risks:   H/o smoking.  She does not have HTN or DM.    REVIEW OF SYSTEMS: Constitutional: No fevers, chills, sweats, or change in appetite.    Some fatigue Eyes: No visual changes, double vision, eye pain Ear, nose and throat: No hearing loss, ear pain, nasal congestion, sore throat Cardiovascular: No chest pain, palpitations Respiratory: reports shortness of breath and wheezes   No snores GastrointestinaI: No nausea, vomiting, diarrhea, abdominal pain, fecal incontinence Genitourinary: No dysuria, urinary retention or frequency.  No nocturia. Musculoskeletal: No neck pain, back  pain but notes pain in the hips and knees. . Integumentary: No rash, pruritus, skin lesions Neurological: as above Psychiatric: Notes mild depression at this time but no anxiety Endocrine: No palpitations, diaphoresis, change in appetite, change in weigh or increased thirst Hematologic/Lymphatic: No anemia, purpura, petechiae. Allergic/Immunologic: No itchy/runny eyes, nasal congestion,   She has some seasonal allergies.  ALLERGIES: Allergies  Allergen Reactions   Aspirin Shortness Of Breath    HOME MEDICATIONS:  Current Outpatient Medications:    Acetaminophen-Codeine (TYLENOL WITH CODEINE #3 PO), Take by mouth., Disp: , Rfl:    albuterol (PROAIR HFA) 108 (90 Base) MCG/ACT inhaler, Inhale 1-2 puffs into the lungs every 6 (six) hours as needed for wheezing or shortness of breath., Disp: 18 g, Rfl: 3   clidinium-chlordiazePOXIDE (LIBRAX) 5-2.5 MG capsule, Take 1 capsule by mouth 3 (three) times daily as needed., Disp: 90 capsule, Rfl: 3   clopidogrel (PLAVIX) 75 MG tablet, Take 1 tablet (75 mg total) by mouth daily., Disp: 90 tablet, Rfl: 3   escitalopram (LEXAPRO) 5 MG tablet, Take 1 tablet (5 mg total) by mouth daily., Disp: 90 tablet, Rfl: 3   hyoscyamine (LEVSIN SL) 0.125 MG SL tablet, DISSOVE 1 TABLET UNDER TONGUE EVERY 4 (FOUR) HOURS AS NEEDED FOR CRAMPING., Disp: 60 tablet, Rfl: 1   Ipratropium-Albuterol (COMBIVENT RESPIMAT) 20-100 MCG/ACT AERS respimat, TAKE 1 PUFF BY MOUTH TWICE A DAY, Disp: 12 g, Rfl: 3   methylphenidate (RITALIN) 10 MG tablet, Take  one pill po up to three times a day, Disp: 90 tablet, Rfl: 0   mometasone-formoterol (DULERA) 100-5 MCG/ACT AERO, Inhale 2 puffs into the lungs daily., Disp: 2 Inhaler, Rfl: 0   pantoprazole (PROTONIX) 40 MG tablet, Take 1 tablet (40 mg total) by mouth 2 (two) times daily., Disp: 180 tablet, Rfl: 2   predniSONE (DELTASONE) 10 MG tablet, Take 1 tablet (10 mg total) by mouth daily., Disp: 90 tablet, Rfl: 1   promethazine  (PHENERGAN) 25 MG suppository, Place 1 suppository (25 mg total) rectally every 12 (twelve) hours as needed for nausea or vomiting., Disp: 6 each, Rfl: 0   Spacer/Aero-Holding Chambers (AEROCHAMBER MV) inhaler, Use as instructed, Disp: 1 each, Rfl: 0  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   Unspecified sinusitis (chronic)    Vision abnormalities     PAST SURGICAL HISTORY: Past Surgical History:  Procedure Laterality Date   BUNIONECTOMY     left foot   CHOLECYSTECTOMY  05/05/2012   Procedure: LAPAROSCOPIC CHOLECYSTECTOMY WITH INTRAOPERATIVE CHOLANGIOGRAM;  Surgeon: Gayland Curry, MD,FACS;  Location: [facility] OR;  Service: General;  Laterality: N/A;   COLONOSCOPY     COLONOSCOPY W/ BIOPSIES AND POLYPECTOMY     NASAL POLYP SURGERY     x 3    POLYPECTOMY     THUMB FUSION  10/13   rt hand  pinned but pin removed   TONSILLECTOMY      FAMILY HISTORY: Family History  Problem Relation Age of Onset   Colon cancer Maternal Grandfather        unsure age of onset    Cancer Maternal Grandfather        colon   Stroke Maternal Aunt    Heart disease Father        Vague history   Prostate cancer Brother    Ataxia Daughter        died at age 58.5    SOCIAL HISTORY:  Social History   Socioeconomic History   Marital status: Married    Spouse name: Not on file   Number of children: 2   Years of education: Not on file   Highest education level: Not on file  Occupational  History   Occupation: Theatre manager    Employer: [employer]  Tobacco Use   Smoking status: Former Smoker    Packs/day: 0.50    Years: 20.00    Pack years: 10.00    Types: Cigarettes    Quit date: 06/14/1988    Years since quitting: 31.5   Smokeless tobacco: Never Used  Substance and Sexual Activity   Alcohol use: No   Drug use: No   Sexual activity: Yes    Birth control/protection: Post-menopausal  Other Topics Concern   Not on file  Social History Narrative   Lives with husband.     Social Determinants of Health   Financial Resource Strain:    Difficulty of Paying Living  Expenses:   Food Insecurity:    Worried About Charity fundraiser in the Last Year:    Arboriculturist in the Last Year:   Transportation Needs:    Film/video editor (Medical):    Lack of Transportation (Non-Medical):   Physical Activity:    Days of Exercise per Week:    Minutes of Exercise per Session:   Stress:    Feeling of Stress :   Social Connections:    Frequency of Communication with Friends and Family:    Frequency of Social Gatherings with Friends and Family:    Attends Religious Services:    Active Member of Clubs or Organizations:    Attends Archivist Meetings:    Marital Status:   Intimate Partner Violence:    Fear of Current or Ex-Partner:    Emotionally Abused:    Physically Abused:    Sexually Abused:     PHYSICAL EXAM  Vitals:   01/02/20 1416  BP: 133/88  Pulse: 80  Weight: 157 lb 8 oz (71.4 kg)  Height: 5\' 9"  (1.753 m)    Body mass index is 23.26 kg/m.   General: The patient is well-developed and well-nourished and in no acute distress.  Head is [place]/AT   Skin: Extremities are without rash or  edema.   Neurologic Exam  Mental status: The patient is alert and oriented x 3 at the time of the examination. Today, the patient has apparent normal recent and remote memory, with mildly reduced concentration ability.    Speech is normal.  Cranial nerves: Extraocular movements are full. Facial strength is normal.  Trapezius and sternocleidomastoid strength is normal. No dysarthria is noted.    No obvious hearing deficits are noted.  Motor:  Muscle bulk is normal.   Tone is normal. Strength is  5 / 5 in all 4 extremities.   Sensory: Sensory testing is intact to touch  Coordination: Cerebellar testing reveals good finger-nose-finger   Gait and station: Station is normal.   Gait is normal for age. Tandem gait is mildly wide. Romberg is negative.   Reflexes: Deep tendon reflexes are symmetric and normal bilaterally.       ASSESSMENT AND PLAN  Memory difficulties - Plan: TSH, CBC with Differential/Platelet, Comprehensive metabolic panel  Attention deficit - Plan: TSH  Depression with anxiety - Plan: TSH, CBC with Differential/Platelet, Comprehensive metabolic panel  Low serum vitamin D - Plan: VITAMIN D 25 Hydroxy (Vit-D Deficiency, Fractures), CBC with Differential/Platelet, Comprehensive metabolic panel  White matter abnormality on MRI of brain   1.   Continue Ritalin 10 mg up to bid.   Continue Lexapro for mood 2.   Stay active physically and mentally.   Ok to do some work.  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   3.   Check vit D, TSH, CBC, CMP 4.    rtc 6 months sooner if new or wrosening issues  [name] [name] A. Felecia Shelling, MD, PhD 7/37/1062, 6:94 PM Certified in Neurology, Clinical Neurophysiology, Sleep Medicine, Pain Medicine and Neuroimaging  [facility] [address] [place], [place] [address] [phone]

## 2020-01-03 ENCOUNTER — Telehealth: Payer: Self-pay | Admitting: *Deleted

## 2020-01-03 LAB — COMPREHENSIVE METABOLIC PANEL
ALT: 9 IU/L (ref 0–32)
AST: 17 IU/L (ref 0–40)
Albumin/Globulin Ratio: 2.2 (ref 1.2–2.2)
Albumin: 4.7 g/dL (ref 3.8–4.8)
Alkaline Phosphatase: 78 IU/L (ref 48–121)
BUN/Creatinine Ratio: 18 (ref 12–28)
BUN: 21 mg/dL (ref 8–27)
Bilirubin Total: 0.3 mg/dL (ref 0.0–1.2)
CO2: 24 mmol/L (ref 20–29)
Calcium: 9.4 mg/dL (ref 8.7–10.3)
Chloride: 104 mmol/L (ref 96–106)
Creatinine, Ser: 1.17 mg/dL — ABNORMAL HIGH (ref 0.57–1.00)
GFR calc Af Amer: 55 mL/min/{1.73_m2} — ABNORMAL LOW (ref >59)
GFR calc non Af Amer: 48 mL/min/{1.73_m2} — ABNORMAL LOW (ref >59)
Globulin, Total: 2.1 g/dL (ref 1.5–4.5)
Glucose: 109 mg/dL — ABNORMAL HIGH (ref 65–99)
Potassium: 4.5 mmol/L (ref 3.5–5.2)
Sodium: 141 mmol/L (ref 134–144)
Total Protein: 6.8 g/dL (ref 6.0–8.5)

## 2020-01-03 LAB — CBC WITH DIFFERENTIAL/PLATELET
Basophils Absolute: 0 10*3/uL (ref 0.0–0.2)
Basos: 0 %
EOS (ABSOLUTE): 0 10*3/uL (ref 0.0–0.4)
Eos: 0 %
Hematocrit: 37.8 % (ref 34.0–46.6)
Hemoglobin: 12.5 g/dL (ref 11.1–15.9)
Immature Grans (Abs): 0 10*3/uL (ref 0.0–0.1)
Immature Granulocytes: 1 %
Lymphocytes Absolute: 1.2 10*3/uL (ref 0.7–3.1)
Lymphs: 18 %
MCH: 26.9 pg (ref 26.6–33.0)
MCHC: 33.1 g/dL (ref 31.5–35.7)
MCV: 82 fL (ref 79–97)
Monocytes Absolute: 0.2 10*3/uL (ref 0.1–0.9)
Monocytes: 3 %
Neutrophils Absolute: 5.4 10*3/uL (ref 1.4–7.0)
Neutrophils: 78 %
Platelets: 332 10*3/uL (ref 150–450)
RBC: 4.64 x10E6/uL (ref 3.77–5.28)
RDW: 14.2 % (ref 11.7–15.4)
WBC: 6.8 10*3/uL (ref 3.4–10.8)

## 2020-01-03 LAB — VITAMIN D 25 HYDROXY (VIT D DEFICIENCY, FRACTURES): Vit D, 25-Hydroxy: 31.9 ng/mL (ref 30.0–100.0)

## 2020-01-03 LAB — TSH: TSH: 0.56 u[IU]/mL (ref 0.450–4.500)

## 2020-01-03 NOTE — Telephone Encounter (Signed)
Called and spoke with pt about results per Dr. Felecia Shelling note. She takes a calcium/vit d supplement but it only has 800U of Vit D. She will start on a separate Vit D supplement to be 2000U/day.  She will also call to make appt with PCP to f/u on mildly reduced kidney function. She has no upcoming f.u with them. She has appt next Friday with urologist.

## 2020-01-03 NOTE — Telephone Encounter (Signed)
-----   Message from Britt Bottom, MD sent at 01/03/2020  8:29 AM EDT ----- Vit D was low normal --- if not taking supplements should take 2000 U daily.   Kidney labs show that the kidney function is mildly reduced.   She should discuss further next time she sees her PCP

## 2020-01-07 ENCOUNTER — Telehealth: Payer: Self-pay | Admitting: Gastroenterology

## 2020-01-07 MED ORDER — PANTOPRAZOLE SODIUM 40 MG PO TBEC: 40.0000 mg | DELAYED_RELEASE_TABLET | Freq: Two times a day (BID) | ORAL | 2 refills | Status: DC

## 2020-01-07 NOTE — Telephone Encounter (Signed)
Prescription has been sent to patient's pharmacy

## 2020-01-18 ENCOUNTER — Telehealth: Payer: Self-pay | Admitting: Gastroenterology

## 2020-01-18 MED ORDER — HYOSCYAMINE SULFATE 0.125 MG SL SUBL: SUBLINGUAL_TABLET | SUBLINGUAL | 3 refills | Status: DC

## 2020-01-18 NOTE — Telephone Encounter (Signed)
Prescription sent to [facility].

## 2020-02-06 ENCOUNTER — Ambulatory Visit (INDEPENDENT_AMBULATORY_CARE_PROVIDER_SITE_OTHER): Payer: Medicare Other | Admitting: Internal Medicine

## 2020-02-06 ENCOUNTER — Encounter: Payer: Self-pay | Admitting: Internal Medicine

## 2020-02-06 ENCOUNTER — Other Ambulatory Visit: Payer: Self-pay

## 2020-02-06 DIAGNOSIS — J449 Chronic obstructive pulmonary disease, unspecified: Secondary | ICD-10-CM | POA: Diagnosis not present

## 2020-02-06 DIAGNOSIS — U071 COVID-19: Secondary | ICD-10-CM | POA: Diagnosis not present

## 2020-02-06 LAB — PULMONARY FUNCTION TEST
DL/VA % pred: 81 %
DL/VA: 3.27 ml/min/mmHg/L
DLCO cor % pred: 68 %
DLCO cor: 15.92 ml/min/mmHg
DLCO unc % pred: 66 %
DLCO unc: 15.47 ml/min/mmHg
FEF 25-75 Post: 2.15 L/sec
FEF 25-75 Pre: 1.94 L/sec
FEF2575-%Change-Post: 11 %
FEF2575-%Pred-Post: 93 %
FEF2575-%Pred-Pre: 84 %
FEV1-%Change-Post: 3 %
FEV1-%Pred-Post: 86 %
FEV1-%Pred-Pre: 83 %
FEV1-Post: 2.44 L
FEV1-Pre: 2.35 L
FEV1FVC-%Change-Post: 0 %
FEV1FVC-%Pred-Pre: 99 %
FEV6-%Change-Post: 4 %
FEV6-%Pred-Post: 90 %
FEV6-%Pred-Pre: 86 %
FEV6-Post: 3.21 L
FEV6-Pre: 3.08 L
FEV6FVC-%Change-Post: 0 %
FEV6FVC-%Pred-Post: 104 %
FEV6FVC-%Pred-Pre: 104 %
FVC-%Change-Post: 3 %
FVC-%Pred-Post: 86 %
FVC-%Pred-Pre: 83 %
FVC-Post: 3.21 L
FVC-Pre: 3.09 L
Post FEV1/FVC ratio: 76 %
Post FEV6/FVC ratio: 100 %
Pre FEV1/FVC ratio: 76 %
Pre FEV6/FVC Ratio: 100 %
RV % pred: 69 %
RV: 1.67 L
TLC % pred: 86 %
TLC: 5.04 L

## 2020-02-06 NOTE — Progress Notes (Signed)
Subjective:    Patient ID: [name] [name], female    DOB: 03-01-1951, 69 y.o.   MRN: [id number]  HPI  female former smoker followed for asthma/COPD, allergic rhinitis/history nasal polyps/aspirin allergy, recurrent sinusitis, complicated by GERD Office Spirometry 06/01/12- moderate obstructive airways disease. FVC 3.15/85%, FEV1 1.87/65%, FEV1/FVC 0.59% FEF 25-75% 0.92/36% PFT 02/01/08- we compared her old PFT-mild obstructive airways disease with minimal response to bronchodilator. FVC 4.14/107%, FEV1 2.83/98%, FEV1/FVC 0.68, FEF 25-75% 1.72/56%. Scores have declined. PFT 07/20/2012-moderate obstructive airways disease with response to bronchodilator, normal lung volumes, diffusion mildly reduced. FVC 3.87/107%, FEV1 2.37/88%, FEV1/FVC 0.61, FEF 25-75% 1.12/39%. TLC 104%, DLCO 71%. 6MWT 07/20/2012-98%, 97%, 98%, 444 m. Noted hip pain. No oxygen limitation. Echocardiogram 08/03/2012-EF 73-71%, gr 1 diastolic dysfunction. No pulmonary hypertension or wall motion abnormality FeNO-12/18/15- 13 PFT 08/03/2018-FVC 3.25/87%, FEV1 2.29/80%, ratio 0.70, FEF 25-75% 1.39/59%, NSC with dilator, TLC 98%, DLCO 88% Covid pneumonia 2020- CTchest HR- 05/15/2019-  1. 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 Little cough or wheeze. Continues Dulera 1 puff through aerochamber twice daily to minimize thrush.   Discussed CT chest. I reviewed images.    Observations/Objective: CTchest HR- 05/15/2019-  IMPRESSION: 1. 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 Findings are suggestive of an alternative diagnosis (not UIP) per consensus guidelines: Diagnosis of Idiopathic Pulmonary Fibrosis: An Official ATS/ERS/JRS/ALAT Clinical Practice Guideline. [place], Iss 5, [phone], Feb 12 2017. 2. Mild patchy air trapping in both lungs, indicative of small airways disease. 3. One vessel coronary atherosclerosis. 4. Moderate hiatal hernia. Aortic Atherosclerosis (ICD10-I70.0).  Assessment and Plan: Covid pneumonia with post-inflammatory NSIP- plan 6 MWT next ov Asthma/ COPD- returning to baseline  Follow Up Instructions: 6 months     02/06/20- 69 year old female former smoker followed for asthma/COPD, ILD/NSIP, allergic rhinitis/history nasal polyps/aspirin allergy, recurrent sinusitis, complicated by GERD, Covid pneumonia 2020, Watching post-covid ILD  From CT chest 05/15/2019. Saw Dr Vaughan Browner 10/24/19-back to baseline. He ordered HRCT( was due in June, 2021- not done), PFT and 6 MW w f/u by me. -----copd,states the heat,humidity causes some shob, feels breathing is better, improved Breathing now ok after resolving Covid last winter, but heat and humidity bother her.  PFT 02/06/20- Diffusion mildly reduced (used Combivent inhaler pre-test, flows normal.  She will get f/u CT tracking [facility]IP at [facility].  ROS-see HPI   + = positive Constitutional:    weight loss, night sweats, fevers, chills, fatigue, lassitude. HEENT:    headaches, difficulty swallowing, tooth/dental problems, sore throat,       sneezing, itching, ear ache, +nasal congestion, post nasal drip, snoring CV:    chest pain, orthopnea, PND, swelling in lower extremities, anasarca,                                                    dizziness,  palpitations Resp:  + shortness of breath with  exertion or at rest.                productive cough, +  non-productive cough, coughing up of blood.              change in color of mucus.  wheezing.   Skin:    rash or lesions. GI:  No-   heartburn, indigestion, abdominal pain, nausea, vomiting,  GU:  MS:   joint pain, stiffness,  Neuro-     nothing unusual Psych:  change in mood or affect.  depression or anxiety.   memory loss.  OBJ- Physical Exam General- Alert, Oriented, Affect-appropriate, Distress- none acute Skin- rash-none, lesions- none, excoriation- none Lymphadenopathy- none Head- atraumatic            Eyes- Gross vision intact, PERRLA, conjunctivae and secretions clear            Ears- Hearing, canals-normal            Nose- not- stuffy, no-Septal dev, mucus, polyps, erosion, perforation             Throat- Mallampati III , mucosa clear/not dry , drainage- none, tonsils- atrophic , + dentures, Neck- flexible , trachea midline, no stridor , thyroid nl, carotid no bruit Chest - symmetrical excursion , unlabored           Heart/CV- RRR , no murmur , no gallop  , no rub, nl s1 s2                           - JVD- none , edema- none, stasis changes- none, varices- none           Lung- clear to P&A, wheeze- none, cough +,  dullness-none, rub- none           Chest wall-  Abd-  Br/ Gen/ Rectal- Not done, not indicated Extrem- cyanosis- none, clubbing, none, atrophy- none, strength- nl Neuro- grossly intact to observation

## 2020-02-06 NOTE — Patient Instructions (Signed)
Get flu vaccine and Covid booster as discussed   We will get the report of your HRCT scan from [facility]  Please call if we can help

## 2020-02-06 NOTE — Progress Notes (Signed)
Full PFT performed today. °

## 2020-02-11 ENCOUNTER — Telehealth: Payer: Self-pay | Admitting: Internal Medicine

## 2020-02-11 NOTE — Telephone Encounter (Signed)
Ok to change Dulera to [place], Anoro or Symbicort 160. She can check with her insurance to see which her formulary prefers, let us know and we can prescribe it. Marland Kitchen

## 2020-02-11 NOTE — Telephone Encounter (Signed)
Spoke with patient regarding insurance not covering [place].Patient stated it would cost between 200-300$ even with discount card. Dr.Young can you please advise if you would like to try a different inhaler.

## 2020-02-11 NOTE — Telephone Encounter (Signed)
Spoke with patient regarding prior message. Advised patient to contact her insurance company to see what is the formulary prefers.  Bevespi, Anoro or Symbicort 160 patient will let us know. Patient's voice was understanding . Northing else further needed.

## 2020-02-12 ENCOUNTER — Telehealth: Payer: Self-pay | Admitting: Internal Medicine

## 2020-02-12 MED ORDER — PREDNISONE 10 MG PO TABS
10.0000 mg | ORAL_TABLET | Freq: Every day | ORAL | 1 refills | Status: DC
Start: 2020-02-12 — End: 2020-06-16

## 2020-02-12 MED ORDER — ANORO ELLIPTA 62.5-25 MCG/INH IN AEPB: 1.0000 | INHALATION_SPRAY | Freq: Every day | RESPIRATORY_TRACT | 5 refills | Status: DC

## 2020-02-12 NOTE — Telephone Encounter (Signed)
Called and spoke with Patient. Patient stated [name] [name] is approved by her insurance.  Patient also requested a prednisone refill.  Patient requested all prescriptions be sent to [facility], with 90 days supply. Prescriptions sent.  Nothing further at this time.  Per 8/30 encounter from [facility] to change Dulera to [place], Anoro or Symbicort 160. She can check with her insurance to see which her formulary prefers, let us know and we can prescribe it. Marland Kitchen

## 2020-02-15 ENCOUNTER — Other Ambulatory Visit: Payer: Self-pay | Admitting: Neurology

## 2020-02-15 NOTE — Telephone Encounter (Signed)
Pt request refill methylphenidate (RITALIN) 10 MG tablet at [id number] 

## 2020-02-19 ENCOUNTER — Other Ambulatory Visit: Payer: Self-pay | Admitting: Gastroenterology

## 2020-02-19 IMAGING — DX DG CHEST 2V
2 series · 2 of 2 positions shown · non-contrast
Comparison: CT chest 08/10/2012.  Chest x-ray 05/01/2012.

CLINICAL DATA: COPD.

EXAM:
CHEST - 2 VIEW

[chest pa]
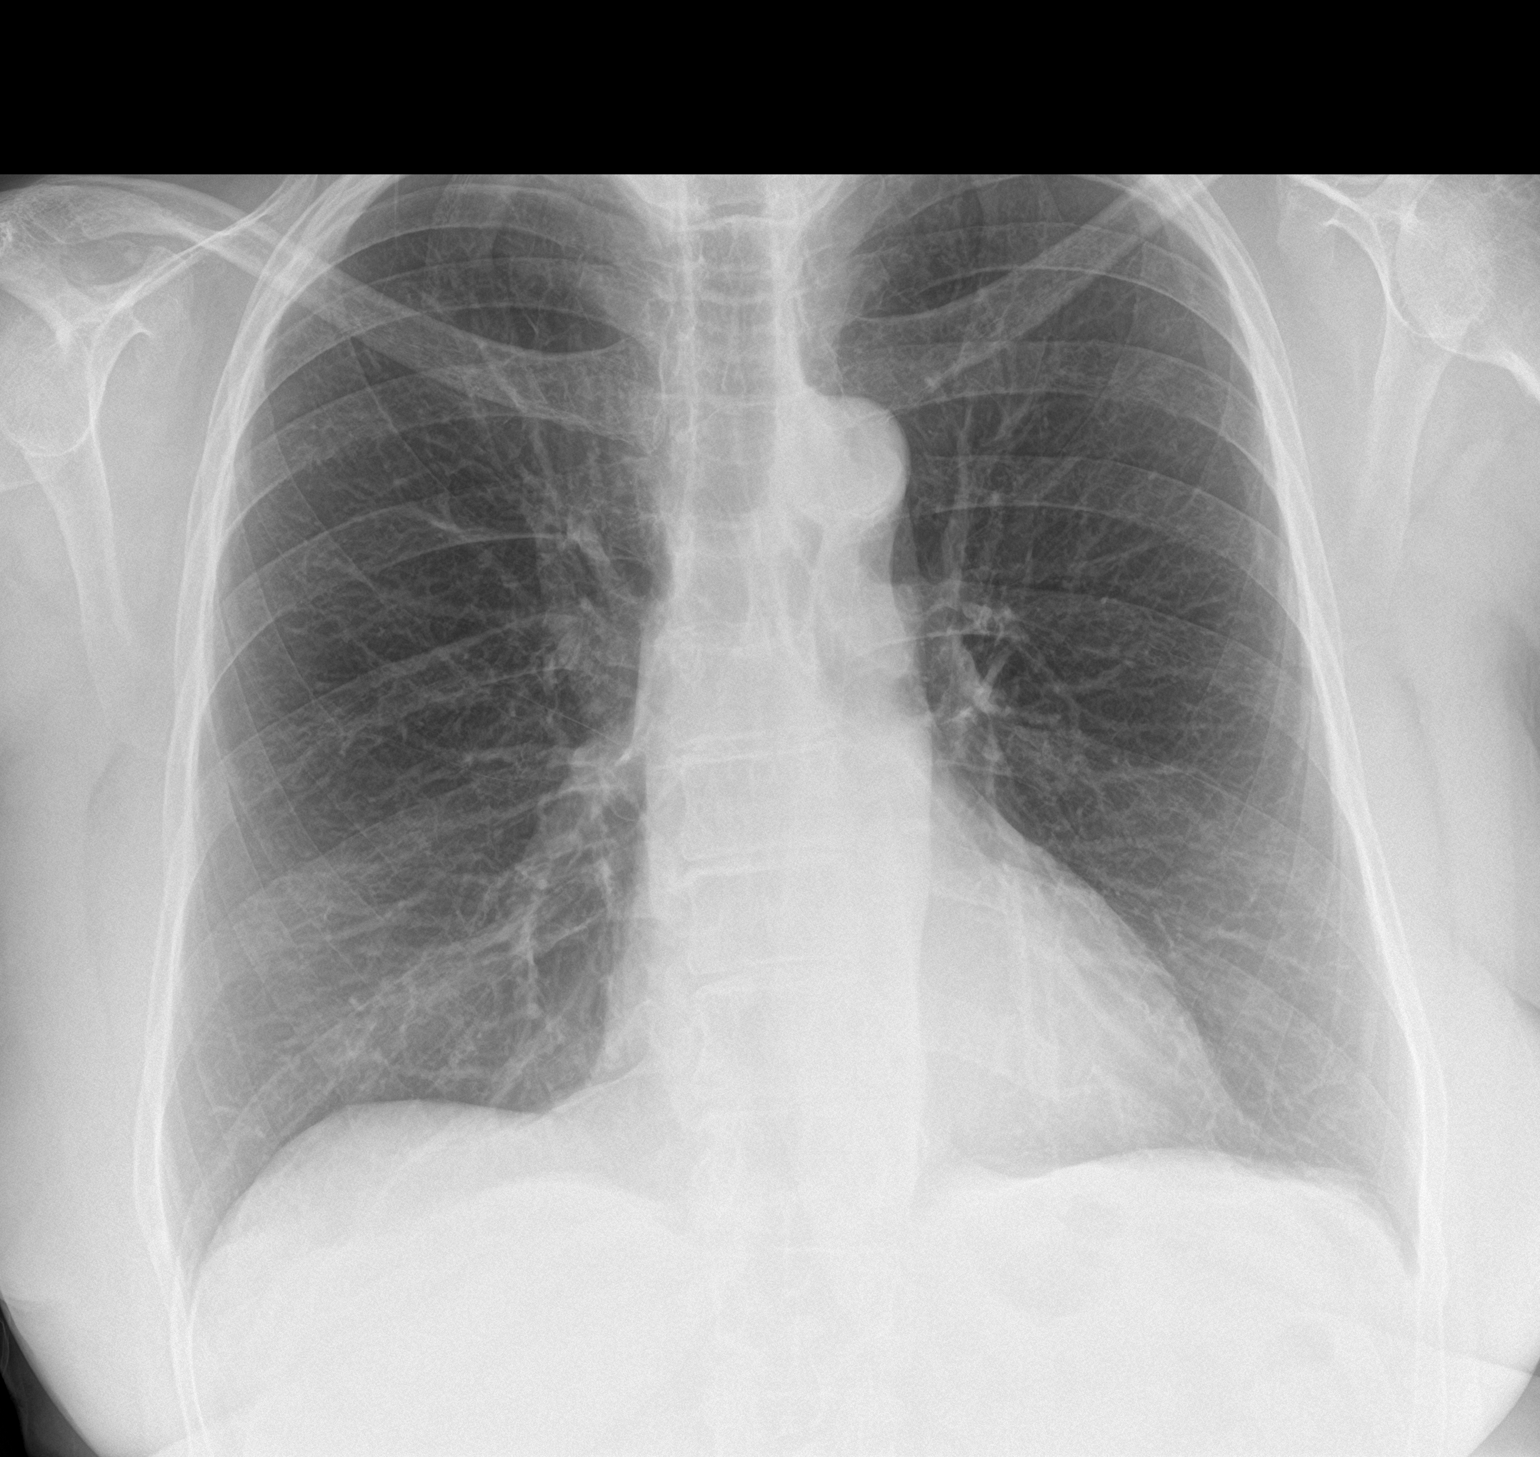

[chest lat]
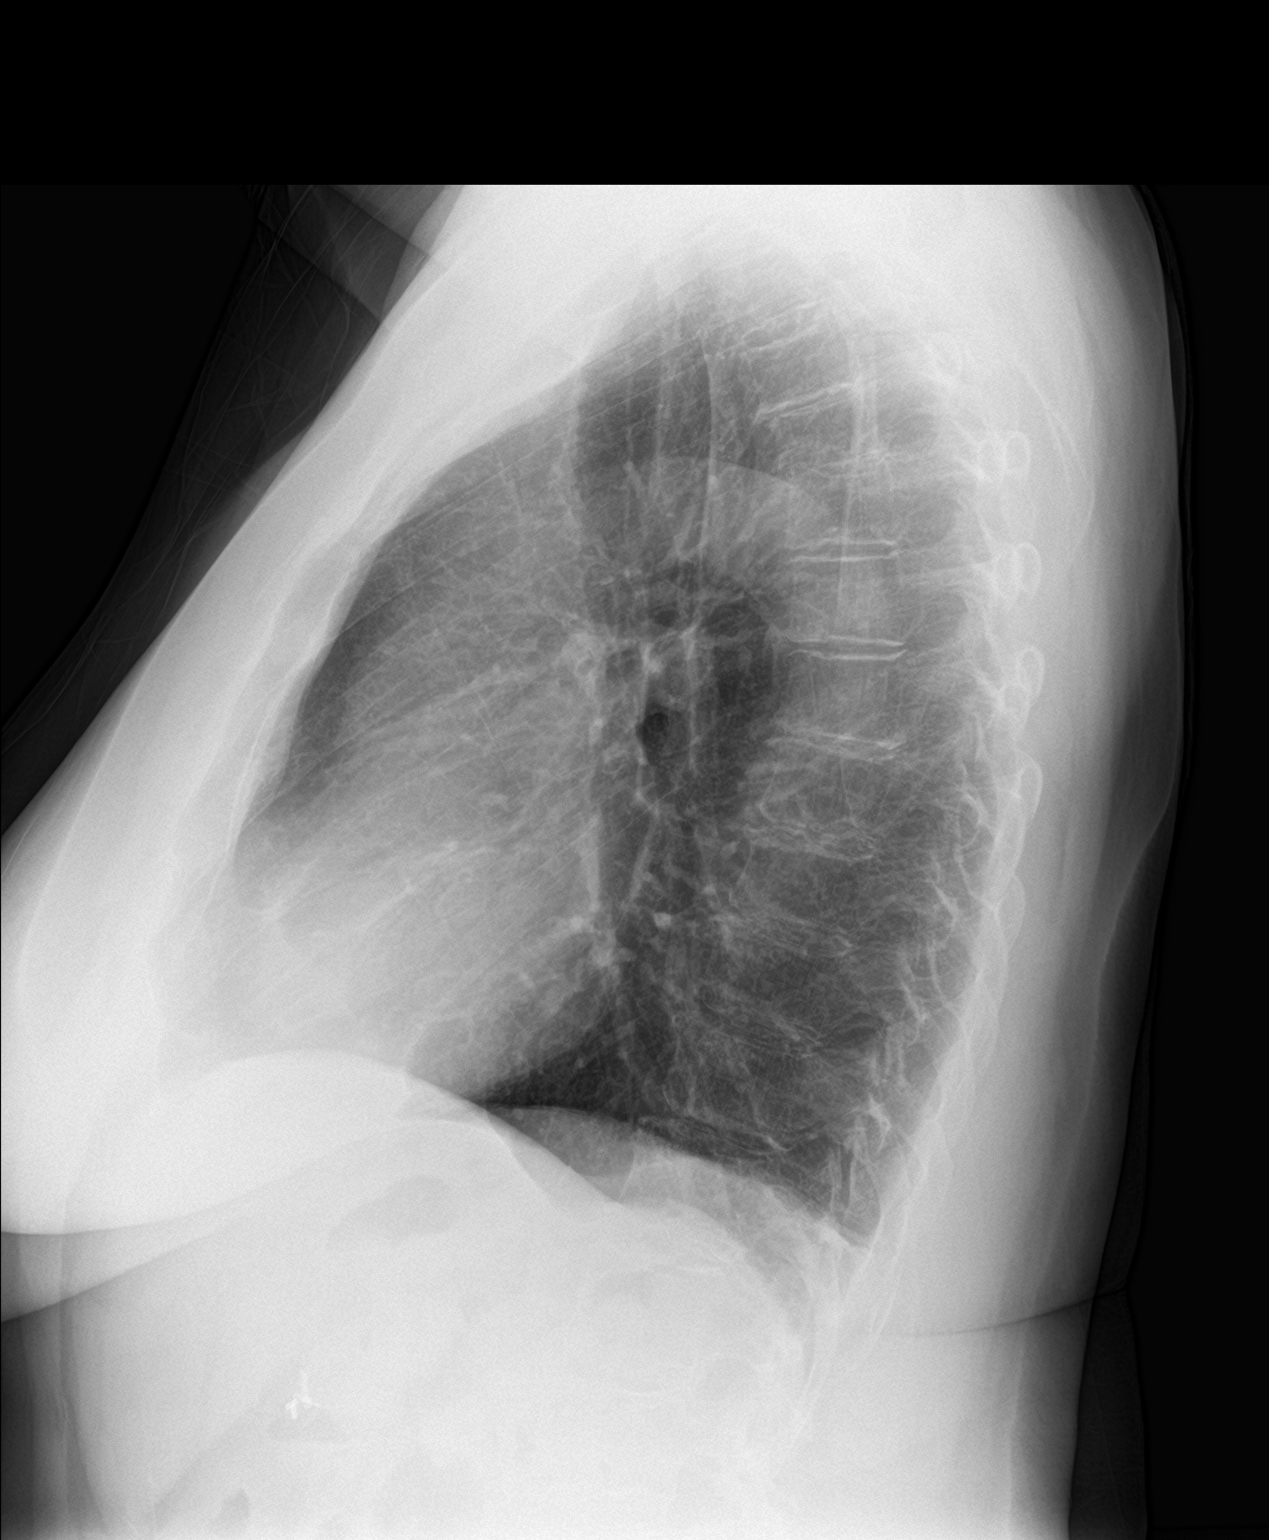

[2 of 2 positions shown; findings below may reference images not displayed]

FINDINGS: Mediastinum hilar structures normal. Lungs are clear. Mild bibasilar
subsegmental atelectasis. No pleural effusion or pneumothorax.
Diffuse thoracic cage osteopenia. Degenerative change thoracic
spine.
IMPRESSION: Mild bibasilar subsegmental atelectasis, otherwise negative chest.
No acute infiltrate.

## 2020-02-19 MED ORDER — METHYLPHENIDATE HCL 10 MG PO TABS
ORAL_TABLET | ORAL | 0 refills | Status: DC
Start: 2020-02-19 — End: 2020-02-20

## 2020-02-19 NOTE — Addendum Note (Signed)
Addended by: Wyvonnia Lora on: 02/19/2020 07:24 AM   Modules accepted: Orders

## 2020-02-20 ENCOUNTER — Other Ambulatory Visit: Payer: Self-pay | Admitting: Neurology

## 2020-02-20 MED ORDER — METHYLPHENIDATE HCL 10 MG PO TABS
ORAL_TABLET | ORAL | 0 refills | Status: DC
Start: 2020-02-20 — End: 2020-05-19

## 2020-02-20 NOTE — Assessment & Plan Note (Signed)
Mild COPD, mainly impacting DLCO Plan- no change

## 2020-02-20 NOTE — Telephone Encounter (Signed)
I checked and [id number] [place], [address] 02/19/20. I called them at [phone].  Asked they d/c rx methylphenidate sent to them yesterday. Sent request to MD to [facility] [address] in [place] instead

## 2020-02-20 NOTE — Telephone Encounter (Signed)
[facility] notify [facility] do not have [facility]phen[facility]ate (RITALIN) 10 MG tablet in stock. Pt request prescription refill sent to [place], [place].

## 2020-02-20 NOTE — Assessment & Plan Note (Addendum)
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 Expect this to be pretty stable. Plan- f/u CT chest at [facility] for comparability, recommend booster

## 2020-02-28 ENCOUNTER — Telehealth: Payer: Self-pay | Admitting: Internal Medicine

## 2020-02-28 NOTE — Telephone Encounter (Signed)
[facility] requested records from [facility] Dept received a fax stating they have no records for the treatment dates requested.  02/28/20  KLM

## 2020-03-17 ENCOUNTER — Telehealth: Payer: Self-pay | Admitting: Internal Medicine

## 2020-03-17 NOTE — Telephone Encounter (Signed)
Pt was informed to stop combivent and use anoro and rescue inhaler as back every 6 hour as needed

## 2020-03-17 NOTE — Telephone Encounter (Signed)
Yes- Ajor should work as her maintenance inhaler and she can use her albuterol inhaler up to every 6 hours if needed. This should be cheaper fro her than trying to also use Combivent

## 2020-03-17 NOTE — Telephone Encounter (Signed)
Called and spoke with patient she states that [facility] is not covered anymore and that she was taking Dulera and Combivent together. Pharmacy told her when she tried to refill her Combivent that she no longer needs it because she is on Anoro.  Dr. Annamaria Boots please advise if this is correct and if she only needs to be on Anoro and rescue inhaler.

## 2020-05-14 ENCOUNTER — Telehealth: Payer: Self-pay | Admitting: Internal Medicine

## 2020-05-14 NOTE — Telephone Encounter (Signed)
Then ok for her to try otc nasal saline sinus rinse like NeilMed or similar. Follow directions on box. Can also use otc throat lozenges.

## 2020-05-14 NOTE — Telephone Encounter (Signed)
Called patient let her know Dr. Janee Morn recommendations.   Patient states she cannot take anything that will dry up her sinuses per her "sinus doctor" but her old doctor retired and she wont see someone new until end of December.   She states Zyrtec and Claritin do not work.   She wants to know if she needs to make an appointment.   Dr. Annamaria Boots please advise

## 2020-05-14 NOTE — Telephone Encounter (Signed)
Suggest an otc antihistamine like Claritin or Zyrtec, and an otc nasal steroid spray like Flonase.  I don't think she will need antibiotic or prednisone at this time.

## 2020-05-14 NOTE — Telephone Encounter (Signed)
Called and spoke with patient about recs from Dr. Annamaria Boots she has been doing saline rinses already and using throat lozenges. States she will continue to do them and if she is not better or feeling worse by Monday she will call office back. Expressed understanding. Nothing further needed at this time.

## 2020-05-14 NOTE — Telephone Encounter (Signed)
Called and spoke with patient who states that she is having seasonal allergy symptoms for about 2 weeks and wanted a RX for it - scratchy throat, sneezing, feeling bad. Has been using saline nasal rinse so mucus is clear has not tried anything else. Feels congested/stuffy headed. No fevers, has not been covid tested

## 2020-05-19 ENCOUNTER — Other Ambulatory Visit: Payer: Self-pay | Admitting: Neurology

## 2020-05-19 MED ORDER — METHYLPHENIDATE HCL 10 MG PO TABS
ORAL_TABLET | ORAL | 0 refills | Status: DC
Start: 2020-05-19 — End: 2020-09-01

## 2020-05-19 NOTE — Telephone Encounter (Signed)
Pt called needing a refill on her methylphenidate (RITALIN) 10 MG tablet [facility] [address]

## 2020-05-27 ENCOUNTER — Telehealth: Payer: Self-pay | Admitting: Internal Medicine

## 2020-05-27 MED ORDER — PREDNISONE 20 MG PO TABS: 20.0000 mg | ORAL_TABLET | Freq: Every day | ORAL | 0 refills | Status: DC

## 2020-05-27 NOTE — Telephone Encounter (Signed)
Called and spoke with pt letting her know the info stated by CY and she verbalized understanding. So she would not have to use 10mg  due to increasing to 20mg  for 4 days, pt requested sending rx for 20mg  to pharmacy for her. Verified preferred pharmacy and sent this in for pt. Nothing further needed.

## 2020-05-27 NOTE — Telephone Encounter (Signed)
Called and spoke with patient who states that for a couple of days she has a heaviness in her chest. Has been having problems with her sinuses, drainage and sneezing. Patient is supposed to see new sinus doctor on 06/02/20. Patient called on 05/14/20 for the same symptoms, except is not having heaviness in chest for the last 2 days. Denies wheezing, cough, fever  Dr. Annamaria Boots please advise

## 2020-05-27 NOTE — Telephone Encounter (Signed)
She has Anoro, albuterol hfa, a nebulizer machine and is on prednisone 10 mg daily. I can't tell what the heavy chest feeling is that she describes. We can try increasing prednisone to 20 mg / day for 4 days, then go back to 10 mg daily.

## 2020-06-16 ENCOUNTER — Telehealth: Payer: Self-pay | Admitting: Internal Medicine

## 2020-06-16 MED ORDER — PREDNISONE 10 MG PO TABS: 10.0000 mg | ORAL_TABLET | Freq: Every day | ORAL | 0 refills | Status: DC

## 2020-06-16 MED ORDER — PREDNISONE 10 MG PO TABS
10.0000 mg | ORAL_TABLET | Freq: Every day | ORAL | 1 refills | Status: DC
Start: 2020-06-16 — End: 2021-02-27

## 2020-06-16 NOTE — Telephone Encounter (Signed)
Standing script for prednisone sent to mail order Short term supply sent to [facility]

## 2020-06-16 NOTE — Telephone Encounter (Signed)
Will send to Dr. Maple Hudson  For approval

## 2020-06-16 NOTE — Telephone Encounter (Signed)
Spoke with the pt and notified that her rxs for pred have been sent  Nothing further needed

## 2020-06-16 NOTE — Telephone Encounter (Signed)
pt states that she reeives her predinosne via mail order - states that they will not get it to her until Friday and she is out - wants to get a few days to hold her over - [facility] [place] - also says that the mail order RX will need a refill as this is the last refill - please advise - mail order - [facility] [employer]

## 2020-06-23 ENCOUNTER — Other Ambulatory Visit: Payer: Self-pay | Admitting: Gastroenterology

## 2020-06-24 NOTE — Telephone Encounter (Signed)
This refill request on your patient came to my inbox.  - HD

## 2020-06-25 NOTE — Telephone Encounter (Signed)
Are you ok with me refilling the phenergan suppositories?

## 2020-06-25 NOTE — Telephone Encounter (Signed)
Estill Bamberg,    This refill request on [facility] patient came to my inbox.  I forwarded it to him yesterday, but it seems to have come back to me.  I cannot tell if it was refilled.  Please check to be sure Dr. Fuller Plan has seen and attended to it.  - HD

## 2020-06-27 ENCOUNTER — Telehealth: Payer: Self-pay | Admitting: Gastroenterology

## 2020-06-27 ENCOUNTER — Telehealth: Payer: Self-pay | Admitting: Internal Medicine

## 2020-06-27 NOTE — Telephone Encounter (Signed)
Suggest taking total 20 mg prednisone today, Saturday and Sunday. Continue with routine breathing meds.

## 2020-06-27 NOTE — Telephone Encounter (Signed)
Called and spoke with pt in regards to her call.  Pt went to see sinus doctor and was told that she had sinus reflux which was why she was having the symptoms.  Pt said she woke today 1/14 wheezing which did scare her almost making her have a panic attack pt said that she is taking chronic prednisone at 10mg  daily.  Pt stated that she did use her ventolin inhaler once so far today. Pt also is using her anoro inhaler daily. Pt denies any complaints of fever.  Due to the wheezing that has started, pt wants recommendations. Dr. Annamaria Boots, please advise.  Allergies  Allergen Reactions  . Aspirin Shortness Of Breath     Current Outpatient Medications:  .  Acetaminophen-Codeine (TYLENOL WITH CODEINE #3 PO), Take by mouth., Disp: , Rfl:  .  albuterol (PROAIR HFA) 108 (90 Base) MCG/ACT inhaler, Inhale 1-2 puffs into the lungs every 6 (six) hours as needed for wheezing or shortness of breath., Disp: 18 g, Rfl: 3 .  alendronate (FOSAMAX) 70 MG tablet, Take 70 mg by mouth once a week., Disp: , Rfl:  .  CALCIUM-VITAMIN D PO, Take 1 Dose by mouth daily. 800U VIt D, Disp: , Rfl:  .  clopidogrel (PLAVIX) 75 MG tablet, Take 1 tablet (75 mg total) by mouth daily., Disp: 90 tablet, Rfl: 3 .  escitalopram (LEXAPRO) 5 MG tablet, Take 1 tablet (5 mg total) by mouth daily., Disp: 90 tablet, Rfl: 3 .  hyoscyamine (LEVSIN SL) 0.125 MG SL tablet, DISSOVE 1 TABLET UNDER TONGUE EVERY 4 (FOUR) HOURS AS NEEDED FOR CRAMPING., Disp: 60 tablet, Rfl: 1 .  Ipratropium-Albuterol (COMBIVENT RESPIMAT) 20-100 MCG/ACT AERS respimat, TAKE 1 PUFF BY MOUTH TWICE A DAY, Disp: 12 g, Rfl: 3 .  methylphenidate (RITALIN) 10 MG tablet, Take one pill po up to three times a day, Disp: 90 tablet, Rfl: 0 .  pantoprazole (PROTONIX) 40 MG tablet, Take 1 tablet (40 mg total) by mouth 2 (two) times daily., Disp: 180 tablet, Rfl: 2 .  predniSONE (DELTASONE) 10 MG tablet, Take 1 tablet (10 mg total) by mouth daily., Disp: 90 tablet, Rfl: 1 .   predniSONE (DELTASONE) 10 MG tablet, Take 1 tablet (10 mg total) by mouth daily with breakfast., Disp: 20 tablet, Rfl: 0 .  promethazine (PHENERGAN) 25 MG suppository, PLACE 1 SUPPOSITORY (25 MG TOTAL) RECTALLY EVERY 12 (TWELVE) HOURS AS NEEDED FOR NAUSEA OR VOMITING., Disp: 6 suppository, Rfl: 0 .  Spacer/Aero-Holding Chambers (AEROCHAMBER MV) inhaler, Use as instructed, Disp: 1 each, Rfl: 0 .  umeclidinium-vilanterol (ANORO ELLIPTA) 62.5-25 MCG/INH AEPB, Inhale 1 puff into the lungs daily., Disp: 60 each, Rfl: 5 .  VITAMIN D PO, Take 2,000 Units by mouth daily., Disp: , Rfl:

## 2020-06-27 NOTE — Telephone Encounter (Signed)
Attempted to return call. No answer or VM

## 2020-06-27 NOTE — Telephone Encounter (Signed)
Called and spoke with pt letting her know the recs stated by CY and she verbalized understanding. Nothing further needed.

## 2020-07-01 NOTE — Telephone Encounter (Signed)
Patient reports that she was told that she has silent reflux.  They added pepcid. She is already taking pantoprazole BID.  She will come in and see Dr. Fuller Plan on 08/05/20.  I will add her to the cancellation list. She will call back for additional questions or concerns.

## 2020-07-09 ENCOUNTER — Encounter: Payer: Self-pay | Admitting: Family Medicine

## 2020-07-09 ENCOUNTER — Ambulatory Visit: Payer: Medicare Other | Admitting: Family Medicine

## 2020-07-09 VITALS — BP 137/96 | HR 105 | Ht 69.0 in | Wt 163.4 lb

## 2020-07-09 DIAGNOSIS — R413 Other amnesia: Secondary | ICD-10-CM

## 2020-07-09 DIAGNOSIS — R41840 Attention and concentration deficit: Secondary | ICD-10-CM

## 2020-07-09 DIAGNOSIS — F418 Other specified anxiety disorders: Secondary | ICD-10-CM

## 2020-07-09 NOTE — Progress Notes (Signed)
Chief Complaint  Patient presents with  . Follow-up    Rm 2 alone Pt is well, things are about the same     HISTORY OF PRESENT ILLNESS: Today 07/09/20  [name] [name] is a 70 y.o. female here today for follow up for memory difficulties. 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 No concerns for AZD at that time. She continues methylphenidate 10mg  daily and escitalopram 5mg  daily. She feels that she is doing ok. She is no longer working. She is considering stopping methylphenidate. She is having more trouble with acid reflux. Medications have been changed due to concerns of long term PPI and Plavix use. She was started on Pepcid recently and has follow up with GI next month. No stroke or TIA symptoms.    HISTORY (copied from Dr Garth Bigness previous note)  [name] [name] is a 70 y.o. woman who reporting memory decline.   Update 01/02/2020: She feels memory is doing about the same.   She gets some benefit from methylphenidate 10 mg daily.  She rarely a second pill.   She tries to keep her mind active with crafts and Sudoku and other games.   She was working - Medical sales representative.   She feels mood is doing well.   She occasionally gets irritable.    She takes Lexapro 5 mg daily.  Her husband has irritability and anger issues.   She denies any TIA or stroke symptoms.   She is on Plavix and tolerates it well.  MRI of the brain shows confluent white matter changes consistent with severe chronic microvascular ischemic change.   She reports that her gait is stable,   No numbness or weakness.  Bladder is doing well.  She had Covid summer 2020 and was hospitalized x 5 days in July/Aug.  She did not need ventilation but due to COPD and pneumonia needed to be observed.   Vascular risks:   H/o smoking.  She does not have HTN or DM.    REVIEW OF SYSTEMS: Out of a complete 14 system review of symptoms, the patient complains only of the  following symptoms,memory loss, inattention, acid reflux, shoulder pain and all other reviewed systems are negative.   ALLERGIES: Allergies  Allergen Reactions  . Aspirin Shortness Of Breath     HOME MEDICATIONS: Outpatient Medications Prior to Visit  Medication Sig Dispense Refill  . Acetaminophen-Codeine (TYLENOL WITH CODEINE #3 PO) Take by mouth.    Marland Kitchen albuterol (PROAIR HFA) 108 (90 Base) MCG/ACT inhaler Inhale 1-2 puffs into the lungs every 6 (six) hours as needed for wheezing or shortness of breath. 18 g 3  . alendronate (FOSAMAX) 70 MG tablet Take 70 mg by mouth once a week.    Marland Kitchen CALCIUM-VITAMIN D PO Take 1 Dose by mouth daily. 800U VIt D    . clopidogrel (PLAVIX) 75 MG tablet Take 1 tablet (75 mg total) by mouth daily. 90 tablet 3  . escitalopram (LEXAPRO) 5 MG tablet Take 1 tablet (5 mg total) by mouth daily. 90 tablet 3  . hyoscyamine (LEVSIN SL) 0.125 MG SL tablet DISSOVE 1 TABLET UNDER TONGUE EVERY 4 (FOUR) HOURS AS NEEDED FOR CRAMPING. 60 tablet 1  . methylphenidate (RITALIN) 10 MG tablet Take one pill po up to three times a day 90 tablet 0  . pantoprazole (PROTONIX) 40 MG tablet Take 1 tablet (40 mg total) by mouth 2 (two) times daily. 180 tablet 2  . predniSONE (DELTASONE) 10  MG tablet Take 1 tablet (10 mg total) by mouth daily. 90 tablet 1  . predniSONE (DELTASONE) 10 MG tablet Take 1 tablet (10 mg total) by mouth daily with breakfast. 20 tablet 0  . promethazine (PHENERGAN) 25 MG suppository PLACE 1 SUPPOSITORY (25 MG TOTAL) RECTALLY EVERY 12 (TWELVE) HOURS AS NEEDED FOR NAUSEA OR VOMITING. 6 suppository 0  . umeclidinium-vilanterol (ANORO ELLIPTA) 62.5-25 MCG/INH AEPB Inhale 1 puff into the lungs daily. 60 each 5  . Ipratropium-Albuterol (COMBIVENT RESPIMAT) 20-100 MCG/ACT AERS respimat TAKE 1 PUFF BY MOUTH TWICE A DAY 12 g 3  . Spacer/Aero-Holding Chambers (AEROCHAMBER MV) inhaler Use as instructed 1 each 0  . VITAMIN D PO Take 2,000 Units by mouth daily.     No  facility-administered medications prior to visit.     PAST MEDICAL HISTORY: Past Medical History:  Diagnosis Date  . Allergic rhinitis, cause unspecified    failed allergy vaccine  . Allergy   . Anxiety    panic attacks  . Arthritis   . COPD (chronic obstructive pulmonary disease) ([place])   . Depression   . Diverticulosis   . Extrinsic asthma, unspecified   . GERD (gastroesophageal reflux disease)   . Hiatal hernia   . Polyp of nasal cavity   . PONV (postoperative nausea and vomiting)   . S/P dilatation of esophageal stricture 08-2008  . 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 Unspecified sinusitis (chronic)   . Vision abnormalities      PAST SURGICAL HISTORY: Past Surgical History:  Procedure Laterality Date  . BUNIONECTOMY     left foot  . CHOLECYSTECTOMY  05/05/2012   Procedure: LAPAROSCOPIC CHOLECYSTECTOMY WITH INTRAOPERATIVE CHOLANGIOGRAM;  Surgeon: Gayland Curry, MD,FACS;  Location: [facility];  Service: General;  Laterality: N/A;  . COLONOSCOPY    . COLONOSCOPY W/ BIOPSIES AND POLYPECTOMY    . NASAL POLYP SURGERY     x 3   . POLYPECTOMY    . THUMB FUSION  10/13   rt hand  pinned but pin removed  . TONSILLECTOMY       FAMILY HISTORY: Family History  Problem Relation Age of Onset  . Colon cancer Maternal Grandfather        unsure age of onset   . Cancer Maternal Grandfather        colon  . Stroke Maternal Aunt   . Heart disease Father        Vague history  . Prostate cancer Brother   . Ataxia Daughter        died at age 58.5     SOCIAL HISTORY: Social History   Socioeconomic History  . Marital status: Married    Spouse name: Not on file  . Number of children: 2  . Years of education: Not on file  . Highest education level: Not on file  Occupational History  . Occupation: Scientist, research (medical): [employer]  Tobacco Use  . Smoking status: Former Smoker    Packs/day: 0.50    Years: 20.00    Pack years: 10.00    Types:  Cigarettes    Quit date: 06/14/1988    Years since quitting: 32.0  . Smokeless tobacco: Never Used  Substance and Sexual Activity  . Alcohol use: No  . Drug use: No  . Sexual activity: Yes    Birth control/protection: Post-menopausal  Other Topics Concern  . Not on file  Social History Narrative   Lives with husband.  Social Determinants of Health   Financial Resource Strain: Not on file  Food Insecurity: Not on file  Transportation Needs: Not on file  Physical Activity: Not on file  Stress: Not on file  Social Connections: Not on file  Intimate Partner Violence: Not on file      PHYSICAL EXAM  Vitals:   07/09/20 1355  BP: (!) 137/96  Pulse: (!) 105  Weight: 163 lb 6.4 oz (74.1 kg)  Height: 5\' 9"  (1.753 m)   Body mass index is 24.13 kg/m.   Generalized: Well developed, in no acute distress  Cardiology: normal rate and rhythm, no murmur auscultated  Respiratory: clear to auscultation bilaterally    Neurological examination  Mentation: Alert oriented to time, place, history taking. Follows all commands speech and language fluent Cranial nerve II-XII: Pupils were equal round reactive to light. Extraocular movements were full, visual field were full on confrontational test. Facial sensation and strength were normal. Uvula tongue midline. Head turning and shoulder shrug  were normal and symmetric. Motor: The motor testing reveals 5 over 5 strength of all 4 extremities. Good symmetric motor tone is noted throughout.  Sensory: Sensory testing is intact to soft touch on all 4 extremities. No evidence of extinction is noted.  Coordination: Cerebellar testing reveals good finger-nose-finger and heel-to-shin bilaterally.  Gait and station: Gait is normal. Tandem gait is normal. Romberg is negative. No drift is seen.  Reflexes: Deep tendon reflexes are symmetric and normal bilaterally.     DIAGNOSTIC DATA (LABS, IMAGING, TESTING) - I reviewed patient records, labs,  notes, testing and imaging myself where available.  Lab Results  Component Value Date   WBC 6.8 01/02/2020   HGB 12.5 01/02/2020   HCT 37.8 01/02/2020   MCV 82 01/02/2020   PLT 332 01/02/2020      Component Value Date/Time   NA 141 01/02/2020 1448   K 4.5 01/02/2020 1448   CL 104 01/02/2020 1448   CO2 24 01/02/2020 1448   GLUCOSE 109 (H) 01/02/2020 1448   GLUCOSE 95 11/02/2017 1117   BUN 21 01/02/2020 1448   CREATININE 1.17 (H) 01/02/2020 1448   CALCIUM 9.4 01/02/2020 1448   PROT 6.8 01/02/2020 1448   ALBUMIN 4.7 01/02/2020 1448   AST 17 01/02/2020 1448   ALT 9 01/02/2020 1448   ALKPHOS 78 01/02/2020 1448   BILITOT 0.3 01/02/2020 1448   GFRNONAA 48 (L) 01/02/2020 1448   GFRAA 55 (L) 01/02/2020 1448   Lab Results  Component Value Date   CHOL 228 (H) 09/01/2012   HDL 60.00 09/01/2012   LDLDIRECT 146.4 09/01/2012   TRIG 167.0 (H) 09/01/2012   CHOLHDL 4 09/01/2012   Lab Results  Component Value Date   HGBA1C 5.7 09/01/2012   Lab Results  Component Value Date   VITAMINB12 507 10/02/2014   Lab Results  Component Value Date   TSH 0.560 01/02/2020      ASSESSMENT AND PLAN  70 y.o. year old female  has a past medical history of Allergic rhinitis, cause unspecified, Allergy, Anxiety, Arthritis, COPD (chronic obstructive pulmonary disease) ([place]), Depression, Diverticulosis, Extrinsic asthma, unspecified, GERD (gastroesophageal reflux disease), Hiatal hernia, Polyp of nasal cavity, PONV (postoperative nausea and vomiting), S/P dilatation of esophageal stricture (08-2008), Tubulovillous adenoma polyp of colon (08-2008), Unspecified sinusitis (chronic), and Vision abnormalities. here with   Memory difficulties  Attention deficit  Depression with anxiety  Mariska is doing well, today. Methylphenidate seems to not work as well now that she is not working.  She was advised to try to take 5mg  daily for the next 2-3 weeks to see if she notices any changes in attention or memory.  May discontinue if no benefit noted. PDMP shows appropriate refills. Last refill for 90 tablet on 12/6. She will continue escitalopram 5mg  daily. She will continue healthy lifestyle habits. Continue close follow up with PCP. Follow up with neurology in 6 months. She verbalizes understanding and agreement with this plan.   No orders of the defined types were placed in this encounter.  No orders of the defined types were placed in this encounter.    I spent 20 minutes of face-to-face and non-face-to-face time with patient.  This included previsit chart review, lab review, study review, order entry, electronic health record documentation, patient education.    Debbora Presto, MSN, FNP-C 07/09/2020, 2:09 PM  [facility] [address] [place], [place] [address] [phone]

## 2020-07-09 NOTE — Patient Instructions (Signed)
Below is our plan:  We will try to reduce methylphenidate to 5mg  daily to see how you feel. If you do not feel it is helping you may discontinue. Please continue escitalopram. Continue Plavix for concerns of vascular changes on MRI. Continue close follow up with PCP for stroke prevention. Call me with the name of the statin you started recently for cholesterol management and I will update your chart.   Please make sure you are staying well hydrated. I recommend 50-60 ounces daily. Well balanced diet and regular exercise encouraged.    Please continue follow up with care team as directed.   Follow up with Dr Felecia Shelling in 6 months   You may receive a survey regarding today's visit. I encourage you to leave honest feed back as I do use this information to improve patient care. Thank you for seeing me today!      Memory Compensation Strategies  1. Use "WARM" strategy.  W= write it down  A= associate it  R= repeat it  M= make a mental note  2.   You can keep a Social worker.  Use a 3-ring notebook with sections for the following: calendar, important names and phone numbers,  medications, doctors' names/phone numbers, lists/reminders, and a section to journal what you did  each day.   3.    Use a calendar to write appointments down.  4.    Write yourself a schedule for the day.  This can be placed on the calendar or in a separate section of the Memory Notebook.  Keeping a  regular schedule can help memory.  5.    Use medication organizer with sections for each day or morning/evening pills.  You may need help loading it  6.    Keep a basket, or pegboard by the door.  Place items that you need to take out with you in the basket or on the pegboard.  You may also want to  include a message board for reminders.  7.    Use sticky notes.  Place sticky notes with reminders in a place where the task is performed.  For example: " turn off the  stove" placed by the stove, "lock the door" placed  on the door at eye level, " take your medications" on  the bathroom mirror or by the place where you normally take your medications.  8.    Use alarms/timers.  Use while cooking to remind yourself to check on food or as a reminder to take your medicine, or as a  reminder to make a call, or as a reminder to perform another task, etc.   Attention Deficit Hyperactivity Disorder, Adult Attention deficit hyperactivity disorder (ADHD) is a mental health disorder that starts during childhood (neurodevelopmental disorder). For many people with ADHD, the disorder continues into the adult years. Treatment can help you manage your symptoms. What are the causes? The exact cause of ADHD is not known. Most experts believe genetics and environmental factors contribute to ADHD. What increases the risk? The following factors may make you more likely to develop this condition:  Having a family history of ADHD.  Being female.  Being born to a mother who smoked or drank alcohol during pregnancy.  Being exposed to lead or other toxins in the womb or early in life.  Being born before 55 weeks of pregnancy (prematurely) or at a low birth weight.  Having experienced a brain injury. What are the signs or symptoms? Symptoms of this condition  depend on the type of ADHD. The two main types are inattentive and hyperactive-impulsive. Some people may have symptoms of both types. Symptoms of the inattentive type include:  Difficulty paying attention.  Making careless mistakes.  Not following instructions.  Being disorganized.  Avoiding tasks that require time and attention.  Losing and forgetting things.  Being easily distracted. Symptoms of the hyperactive-impulsive type include:  Restlessness.  Talking too much.  Interrupting.  Difficulty with: ? Sitting still. ? Feeling motivated. ? Relaxing. ? Waiting in line or waiting for a turn. In adults, this condition may lead to certain problems, such  as:  Keeping jobs.  Performing tasks at work.  Having stable relationships.  Being on time or keeping to a schedule. How is this diagnosed? This condition is diagnosed based on your current symptoms and your history of symptoms. The diagnosis can be made by a health care provider such as a primary care provider or a mental health care specialist. Your health care provider may use a symptom checklist or a behavior rating scale to evaluate your symptoms. He or she may also want to talk with people who have observed your behaviors throughout your life. How is this treated? This condition can be treated with medicines and behavior therapy. Medicines may be the best option to reduce impulsive behaviors and improve attention. Your health care provider may recommend:  Stimulant medicines. These are the most common medicines used for adult ADHD. They affect certain chemicals in the brain (neurotransmitters) and improve your ability to control your symptoms.  A non-stimulant medicine for adult ADHD (atomoxetine). This medicine increases a neurotransmitter called norepinephrine. It may take weeks to months to see effects from this medicine. Counseling and behavioral management are also important for treating ADHD. Counseling is often used along with medicine. Your health care provider may suggest:  Cognitive behavioral therapy (CBT). This type of therapy teaches you to replace negative thoughts and actions with positive thoughts and actions. When used as part of ADHD treatment, this therapy may also include: ? Coping strategies for organization, time management, impulse control, and stress reduction. ? Mindfulness and meditation training.  Behavioral management. You may work with a Leisure centre manager who is specially trained to help people with ADHD manage and organize activities and function more effectively. Follow these instructions at home: Medicines  Take over-the-counter and prescription medicines only as  told by your health care provider.  Talk with your health care provider about the possible side effects of your medicines and how to manage them.   Lifestyle  Do not use drugs.  Do not drink alcohol if: ? Your health care provider tells you not to drink. ? You are pregnant, may be pregnant, or are planning to become pregnant.  If you drink alcohol: ? Limit how much you use to:  0-1 drink a day for women.  0-2 drinks a day for men. ? Be aware of how much alcohol is in your drink. In the [place] one drink equals one 12 oz bottle of beer (355 mL), one 5 oz glass of wine (148 mL), or one 1 oz glass of hard liquor (44 mL).  Get enough sleep.  Eat a healthy diet.  Exercise regularly. Exercise can help to reduce stress and anxiety.   General instructions  Learn as much as you can about adult ADHD, and work closely with your health care providers to find the treatments that work best for you.  Follow the same schedule each day.  Use reminder  devices like notes, calendars, and phone apps to stay on time and organized.  Keep all follow-up visits as told by your health care provider and therapist. This is important. Where to find more information A health care provider may be able to recommend resources that are available online or over the phone. You could start with:  Attention Deficit Disorder Association (ADDA): PubAddiction.co.nz  National Institute of Mental Health [facility]): https://carter.com/ Contact a health care provider if:  Your symptoms continue to cause problems.  You have side effects from your medicine, such as: ? Repeated muscle twitches, coughing, or speech outbursts. ? Sleep problems. ? Loss of appetite. ? Dizziness. ? Unusually fast heartbeat. ? Stomach pains. ? Headaches.  You are struggling with anxiety, depression, or substance abuse. Get help right away if you:  Have a severe reaction to a medicine. If you ever feel like you may hurt yourself or others, or have  thoughts about taking your own life, get help right away. You can go to the nearest emergency department or call:  Your local emergency services (911 in the [place]  A suicide crisis helpline, such as the [place] at 938-353-6230. This is open 24 hours a day. Summary  ADHD is a mental health disorder that starts during childhood (neurodevelopmental disorder) and often continues into the adult years.  The exact cause of ADHD is not known. Most experts believe genetics and environmental factors contribute to ADHD.  There is no cure for ADHD, but treatment with medicine, cognitive behavioral therapy, or behavioral management can help you manage your condition. This information is not intended to replace advice given to you by your health care provider. Make sure you discuss any questions you have with your health care provider. Document Revised: 10/23/2018 Document Reviewed: 10/23/2018 Elsevier Patient Education  [place].

## 2020-07-09 NOTE — Progress Notes (Signed)
I have read the note, and I agree with the clinical assessment and plan.  Shelma Eiben A. Morrell Fluke, MD, PhD, FAAN Certified in Neurology, Clinical Neurophysiology, Sleep Medicine, Pain Medicine and Neuroimaging  [facility] [address], [address] [place], [place] [address] [phone]  

## 2020-07-14 ENCOUNTER — Encounter: Payer: Self-pay | Admitting: *Deleted

## 2020-07-14 ENCOUNTER — Other Ambulatory Visit: Payer: Self-pay | Admitting: Family Medicine

## 2020-07-16 ENCOUNTER — Ambulatory Visit: Payer: Medicare Other | Admitting: Gastroenterology

## 2020-07-22 ENCOUNTER — Telehealth: Payer: Self-pay | Admitting: *Deleted

## 2020-07-22 NOTE — Telephone Encounter (Signed)
Received fax confirmation for [facility].  For pt. [phone],  [phone] ofv.

## 2020-07-29 ENCOUNTER — Telehealth: Payer: Self-pay | Admitting: Neurology

## 2020-07-29 MED ORDER — ESCITALOPRAM OXALATE 5 MG PO TABS: 5.0000 mg | ORAL_TABLET | Freq: Every day | ORAL | 0 refills | Status: DC

## 2020-07-29 NOTE — Addendum Note (Signed)
Addended by: Brandon Melnick on: 07/29/2020 02:12 PM   Modules accepted: Orders

## 2020-07-29 NOTE — Telephone Encounter (Signed)
Spoke to pt and one week #7 sent to local pharmacy for emer refill while waiting for her mail order.

## 2020-07-29 NOTE — Telephone Encounter (Signed)
PT said she has mail order prescriptions and it will not get to her for awhile and is wondering what to do in the mean time until she gets them. This is for her Lexapro. I said I can have nurse give her a call back.

## 2020-08-04 ENCOUNTER — Telehealth: Payer: Self-pay | Admitting: Family Medicine

## 2020-08-04 MED ORDER — CLOPIDOGREL BISULFATE 75 MG PO TABS: 75.0000 mg | ORAL_TABLET | Freq: Every day | ORAL | 0 refills | Status: DC

## 2020-08-04 NOTE — Telephone Encounter (Signed)
Pt. is asking for a weeks worth of clopidogrel (PLAVIX) 75 MG tablet before she receives full prescription.  [id number]

## 2020-08-04 NOTE — Addendum Note (Signed)
Addended by: Brandon Melnick on: 08/04/2020 04:32 PM   Modules accepted: Orders

## 2020-08-04 NOTE — Telephone Encounter (Signed)
Done

## 2020-08-05 ENCOUNTER — Ambulatory Visit: Payer: Medicare Other | Admitting: Gastroenterology

## 2020-08-28 ENCOUNTER — Telehealth: Payer: Self-pay | Admitting: Family Medicine

## 2020-08-28 NOTE — Telephone Encounter (Signed)
Antiplatelet letter on NP"s desk for review, signature.

## 2020-08-28 NOTE — Telephone Encounter (Signed)
Called Angela, LVM requesting call back to discuss.

## 2020-08-28 NOTE — Telephone Encounter (Signed)
[facility] called back, stated they do not have a standard form to hold medications prior to procedures. Patient is scheduled for a  paraesophageal hernia repair. Their Fax [phone]. I advised will have NP wrote letter. [facility] verbalized understanding, appreciation.

## 2020-08-28 NOTE — Telephone Encounter (Signed)
Levada Dy @ Dr Arvin Collard office is asking for a call re: clearance for pt's Plavix to be held 7 days before pt's procedure she is having on 04-06.  Please call Dr Arnetha Massy office [phone]

## 2020-08-28 NOTE — Telephone Encounter (Signed)
That should be fine. May adjust letter. TY.

## 2020-08-28 NOTE — Telephone Encounter (Signed)
Letter signed, faxed to Dr Arvin Collard office. Received confirmation.

## 2020-09-01 ENCOUNTER — Other Ambulatory Visit: Payer: Self-pay | Admitting: Family Medicine

## 2020-09-01 MED ORDER — METHYLPHENIDATE HCL 10 MG PO TABS: ORAL_TABLET | ORAL | 0 refills | Status: DC

## 2020-09-01 NOTE — Telephone Encounter (Signed)
Pt request refill methylphenidate (RITALIN) 10 MG tablet at [id number]

## 2020-09-08 ENCOUNTER — Telehealth: Payer: Self-pay | Admitting: Internal Medicine

## 2020-09-08 NOTE — Telephone Encounter (Signed)
Pt is calling with questions in regards to exercises to help improve the lungs for COPD. States that her and Dr. Annamaria Boots discuss the exercises and was wondering if it was a place in [facility] she could go to.. Pt is also wondering if fainting is a symptom of COPD. Pt states she walked inside after doing some yard work she was light headed and short widened so she sat down &  when she felt like she had caught her breath she stood up and she urinated on herself and couldn't stop.  Pt also states that she having a hernia removed soon.Marland Kitchen also questioned if getting the Hernia removed would help with COPD -- [phone]

## 2020-09-08 NOTE — Telephone Encounter (Signed)
Called and spoke with patient, advised of recommendations per Dr. Annamaria Boots.  She will her PCP office.  Also advised her to make her surgeon's office aware as well.  Nothing further needed.

## 2020-09-08 NOTE — Telephone Encounter (Signed)
She can see [place] or her PCP might be more appropriate

## 2020-09-08 NOTE — Telephone Encounter (Signed)
Spoke with patient, has upcoming hernia surgery, she was doing some yard work.  Picked up something heavy, got sob, sat down to get her breath.  Felt better and then urinated and could not stop.  Had fainting episode a couple of months ago, got tingling feeling and light headed and passed out at that time.  This was after her 3rd covid test. Took a tramadol for the pain during that episode.  She is unsure what caused this episode.  During her last visit, she had a PFT.  Patient is having more trouble with GERD now and this is why she is having the hernia repaired, it is large according to the surgeon.  She is going to have the robotic surgery.    Dr. Annamaria Boots, Patient wants to know if her COPD could have caused her to feel faint.  She was doing yard work, got sob and felt faint.  She went and sat down and when she felt better, she went to stand up, felt tingling and began urinating and it was uncontrolled and could not stop.  She has an upcoming surgery on 09/17/20 for a large hernia.  Do we need to schedule her for an office visit?  If so can we use one of your RN slots.  Please advise.  Thank you.

## 2020-09-08 NOTE — Telephone Encounter (Signed)
Primary Pulmonologist: Young Last office visit and with whom: 02/05/21 What do we see them for (pulmonary problems): COPD  Last OV assessment/plan:Assessment & Plan Note by Deneise Lever, MD at 02/20/2020 9:01 PM  Author: Deneise Lever, MD Author Type: Physician Filed: 02/20/2020 9:03 PM  Note Status: Bernell List: Cosign Not Required Encounter Date: 02/06/2020  Problem: COVID-19 virus infection  Editor: Deneise Lever, MD (Physician)      Prior Versions: 1. 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 Expect this to be pretty stable. Plan- f/u CT chest at [facility] for comparability, recommend booster        Assessment & Plan Note by Deneise Lever, MD at 02/20/2020 8:59 PM  Author: Deneise Lever, MD Author Type: Physician Filed: 02/20/2020 9:01 PM  Note Status: Written Cosign: Cosign Not Required Encounter Date: 02/06/2020  Problem: COPD mixed type [facility])  Editor: Deneise Lever, MD (Physician)               Mild COPD, mainly impacting DLCO Plan- no change       Patient Instructions by Deneise Lever, MD at 02/06/2020 10:00 AM  Author: Deneise Lever, MD Author Type: Physician Filed: 02/06/2020 10:31 AM  Note Status: Signed Cosign: Cosign Not Required Encounter Date: 02/06/2020  Editor: Deneise Lever, MD (Physician)               Get flu vaccine and Covid booster as discussed   We will get the report of your HRCT scan from [facility]  Please call if we can help        Instructions     Return in about 6 months (around 08/08/2020).  Get flu vaccine and Covid booster as discussed   We will get the report of your HRCT scan from [facility]  Please call if we can help        Reason for call:   (examples of things to ask: : When did symptoms start? Fever? Cough? Productive? Color to sputum? More sputum than usual? Wheezing? Have you needed increased oxygen? Are you  taking your respiratory medications? What over the counter measures have you tried?)  Allergies  Allergen Reactions  . Aspirin Shortness Of Breath    Immunization History  Administered Date(s) Administered  . Fluad Quad(high Dose 65+) 03/26/2019  . Influenza Split 05/14/2013, 03/26/2014, 03/25/2015  . Influenza, High Dose Seasonal PF 03/26/2019  . Influenza, Quadrivalent, Recombinant, Inj, Pf 03/27/2016, 03/27/2019  . Influenza, Seasonal, Injecte, Preservative Fre 03/05/2018  . Influenza,inj,Quad PF,6+ Mos 03/05/2018  . Influenza,trivalent, recombinat, inj, PF 03/27/2016  . Moderna Sars-Covid-2 Vaccination 08/01/2019  . Pneumococcal Polysaccharide-23 09/01/2012, 03/30/2018  . Pneumococcal-Unspecified 09/01/2012, 03/30/2018  . Tdap 09/01/2012

## 2020-09-23 ENCOUNTER — Telehealth: Payer: Self-pay | Admitting: Family Medicine

## 2020-09-23 NOTE — Telephone Encounter (Signed)
I called pt and she states that she is experiencing 2 days of not sleeping, concerned that may be related to ischemia, not taking plavix for 7-10 days prior to her appt. She did have some restless legs those days, tossed and turned.  Relayed also had 2 months ago fainting.  Did see pcp labs , EKG done prior to her hernia surgery (all ok). She had seen and spoken to surgeon and pcp about it as well.  Recommended to take otc sleep aid.  I relayed that due to most recent surgery, would monitor for now.  If things persist to let us know.  She appreciated call back.  I would like AL/NP know as well, out till next week.

## 2020-09-23 NOTE — Telephone Encounter (Signed)
Pt called stating that she had to come off of her blood thinner medication due to surgery and now she is having difficulty sleeping. Pt would like to know if her ischemia diagnosis and being off of the blood thinners would cause her to not be able to sleep. Please advise.

## 2020-10-24 ENCOUNTER — Other Ambulatory Visit: Payer: Self-pay | Admitting: Neurology

## 2020-10-24 NOTE — Telephone Encounter (Signed)
Error

## 2020-11-22 ENCOUNTER — Other Ambulatory Visit: Payer: Self-pay | Admitting: Neurology

## 2020-11-24 ENCOUNTER — Telehealth: Payer: Self-pay | Admitting: Family Medicine

## 2020-11-24 MED ORDER — CLOPIDOGREL BISULFATE 75 MG PO TABS: 75.0000 mg | ORAL_TABLET | Freq: Every day | ORAL | 0 refills | Status: DC

## 2020-11-24 NOTE — Telephone Encounter (Signed)
Pt called and LVM stating that she put in her refill request for her clopidogrel (PLAVIX) 75 MG tablet to [facility]. Pt states they informed her that it will take 5 days to get to her and she is wanting to know if an emergency qt of 5 can be [facility] [address]. Please advise.

## 2020-11-24 NOTE — Telephone Encounter (Signed)
Redid to both mail order and local pharmacy for pt.

## 2020-12-04 ENCOUNTER — Other Ambulatory Visit: Payer: Self-pay | Admitting: Family Medicine

## 2020-12-04 NOTE — Telephone Encounter (Signed)
Pt called needing a refill on her methylphenidate (RITALIN) 10 MG tablet [facility] [address]

## 2020-12-08 MED ORDER — METHYLPHENIDATE HCL 10 MG PO TABS: ORAL_TABLET | ORAL | 0 refills | Status: DC

## 2020-12-08 NOTE — Telephone Encounter (Signed)
Received refill request for Ritalin 10mg . Last OV was on 07/09/20 with Amy, NP.  Next OV is scheduled for 01/07/21 with Dr. Felecia Shelling.  Last RX was written on 09/01/20 for 90 tabs.   [place] [facility] Database has been reviewed.

## 2021-01-07 ENCOUNTER — Ambulatory Visit: Payer: Medicare Other | Admitting: Neurology

## 2021-02-02 IMAGING — DX DG CHEST 2V
2 series · 2 of 2 positions shown · non-contrast
Comparison: Radiograph January 26, 2019.

CLINICAL DATA: Dyspnea.

EXAM:
CHEST - 2 VIEW

[chest pa]
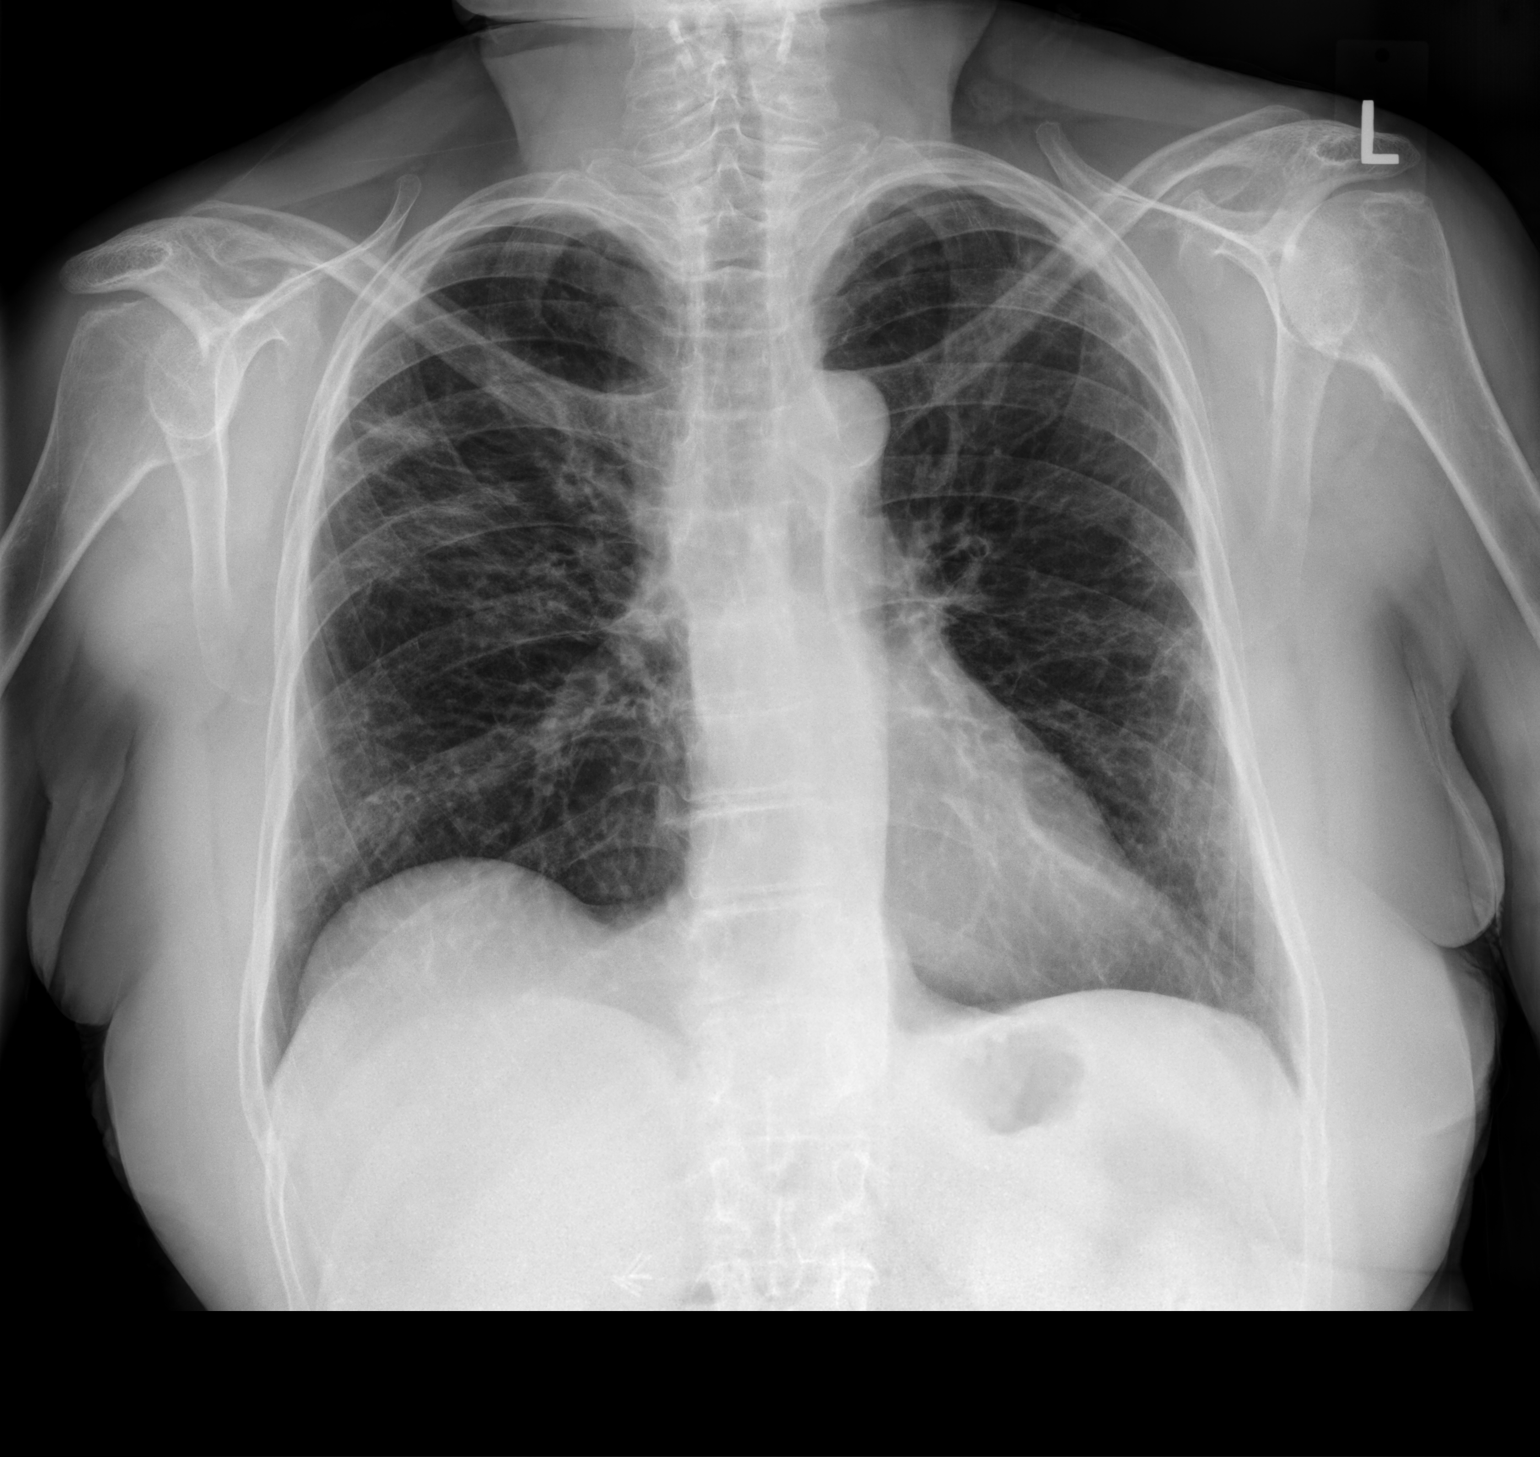

[chest lat]
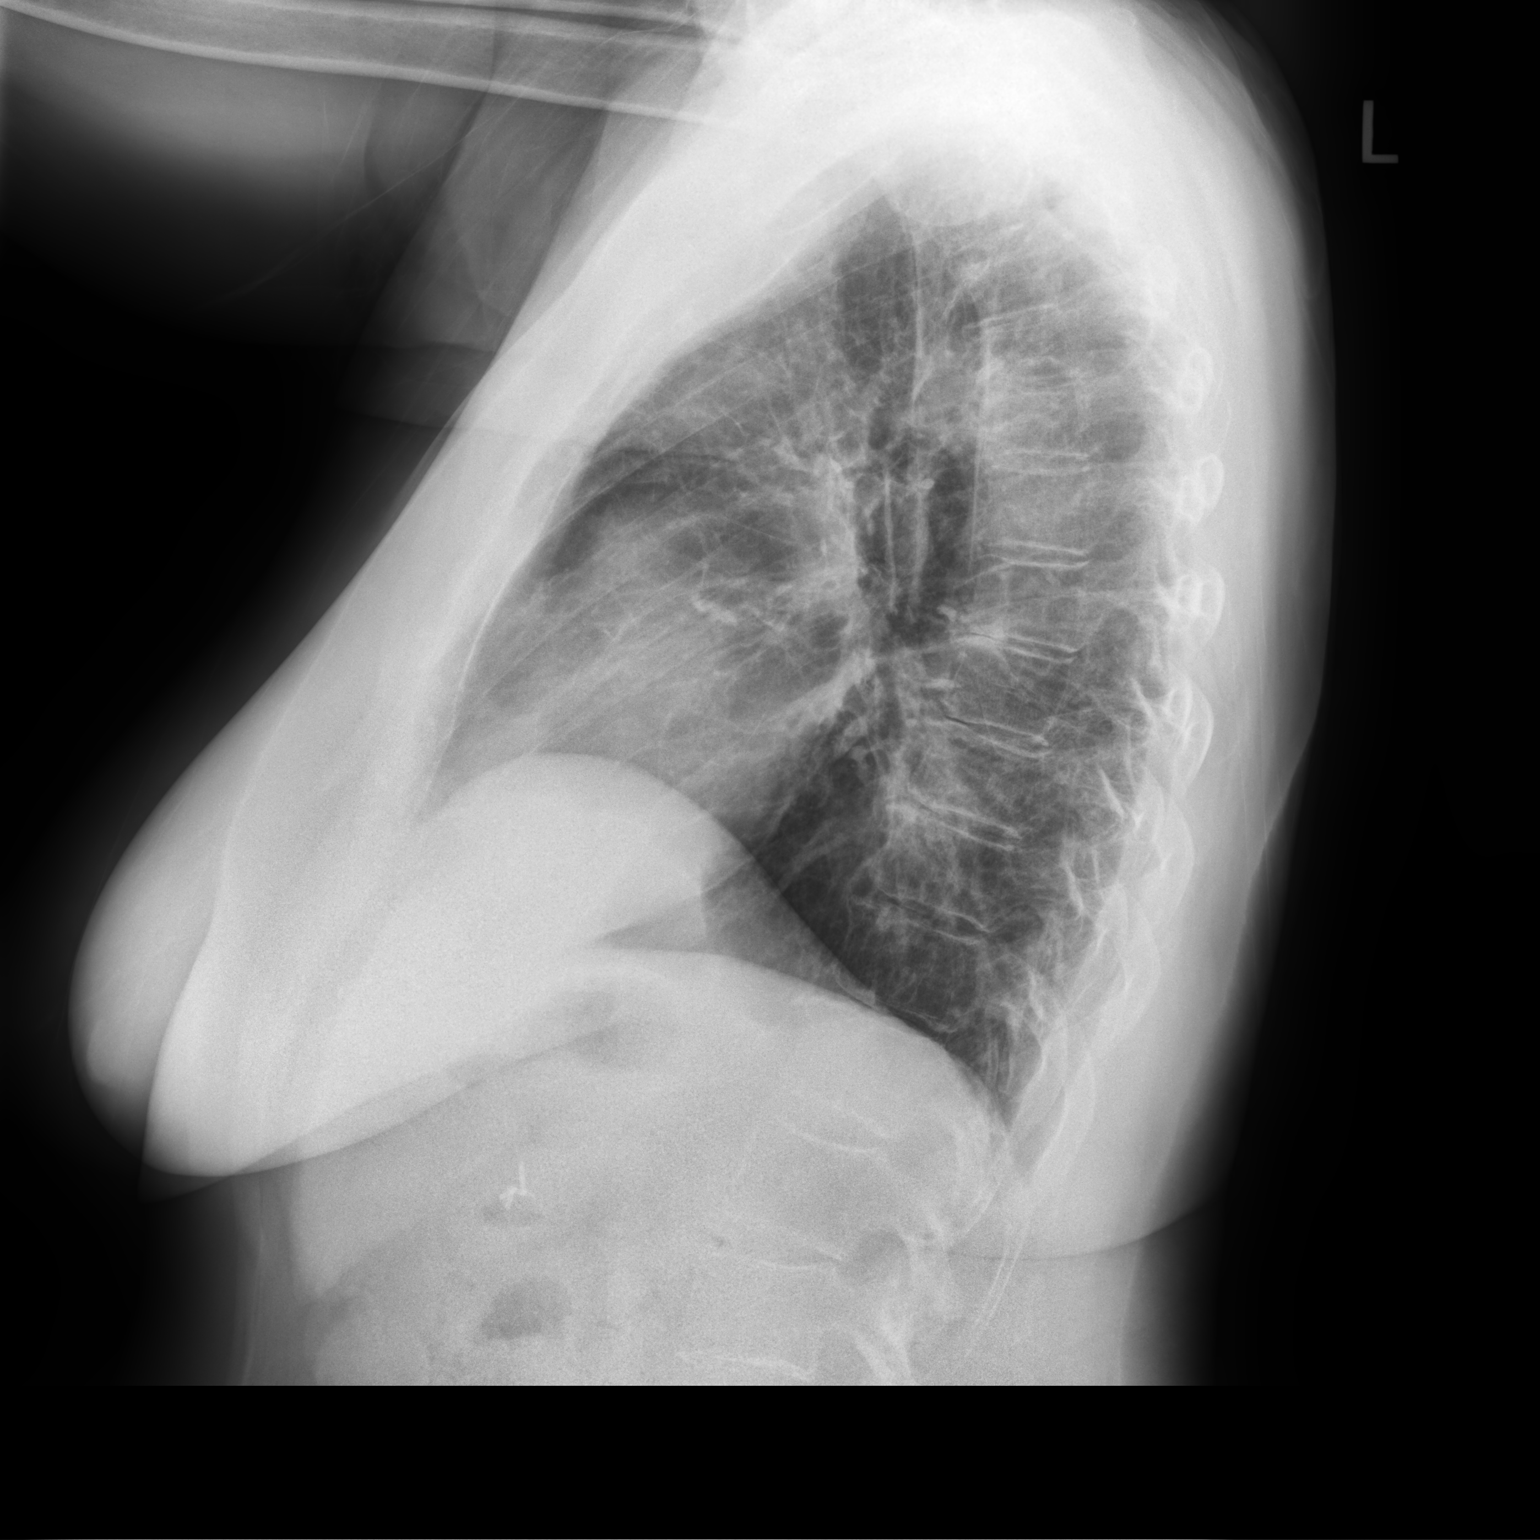

[2 of 2 positions shown; findings below may reference images not displayed]

FINDINGS: The heart size and mediastinal contours are within normal limits. No
pneumothorax or pleural effusion is noted. Mildly decreased stable
bilateral interstitial peripheral lung opacities are noted. The
visualized skeletal structures are unremarkable.
IMPRESSION: Mildly improved bilateral peripheral interstitial lung opacities are
noted suggesting improving pneumonia or postinfectious scarring.

## 2021-02-17 ENCOUNTER — Ambulatory Visit: Payer: Medicare (Managed Care) | Admitting: Neurology

## 2021-02-25 ENCOUNTER — Other Ambulatory Visit: Payer: Self-pay | Admitting: Internal Medicine

## 2021-02-25 NOTE — Telephone Encounter (Signed)
Ok to refill, but she also needs 1 year f/u with me please- next couple of months

## 2021-02-25 NOTE — Telephone Encounter (Signed)
Patient requesting refill for Prednisone. Last office visit 02/06/20. Last refill 06/16/20 #90 1 RF.  Dr. Annamaria Boots is it ok to send in refill?  Thanks

## 2021-02-27 NOTE — Telephone Encounter (Signed)
Called and spoke with Patient.  Patient scheduled yearly follow up with Dr. Annamaria Boots 05/04/21.  Prescription sent to pharmacy.  Nothing further at this time.

## 2021-03-09 ENCOUNTER — Other Ambulatory Visit: Payer: Self-pay | Admitting: Gastroenterology

## 2021-03-09 ENCOUNTER — Other Ambulatory Visit: Payer: Self-pay | Admitting: Family Medicine

## 2021-03-09 MED ORDER — METHYLPHENIDATE HCL 10 MG PO TABS: ORAL_TABLET | ORAL | 0 refills | Status: DC

## 2021-03-09 NOTE — Telephone Encounter (Signed)
Pt request refill methylphenidate (RITALIN) 10 MG tablet at [id number]

## 2021-03-10 MED ORDER — METHYLPHENIDATE HCL 10 MG PO TABS: ORAL_TABLET | ORAL | 0 refills | Status: DC

## 2021-03-10 NOTE — Telephone Encounter (Signed)
[id number]  states they do not have the methylphenidate (RITALIN) 10 MG tablet . [facility] [address] [facility] do [facility] it.  Please send it to them, their # is [phone]

## 2021-03-10 NOTE — Addendum Note (Signed)
Addended by: Wyvonnia Lora on: 03/10/2021 02:50 PM   Modules accepted: Orders

## 2021-03-26 ENCOUNTER — Ambulatory Visit: Payer: Medicare (Managed Care) | Admitting: Neurology

## 2021-03-26 ENCOUNTER — Encounter: Payer: Self-pay | Admitting: Neurology

## 2021-03-26 VITALS — BP 169/77 | HR 114 | Ht 64.0 in | Wt 161.6 lb

## 2021-03-26 DIAGNOSIS — R41840 Attention and concentration deficit: Secondary | ICD-10-CM

## 2021-03-26 DIAGNOSIS — M4317 Spondylolisthesis, lumbosacral region: Secondary | ICD-10-CM

## 2021-03-26 DIAGNOSIS — R413 Other amnesia: Secondary | ICD-10-CM | POA: Diagnosis not present

## 2021-03-26 DIAGNOSIS — F418 Other specified anxiety disorders: Secondary | ICD-10-CM | POA: Diagnosis not present

## 2021-03-26 DIAGNOSIS — R9082 White matter disease, unspecified: Secondary | ICD-10-CM | POA: Diagnosis not present

## 2021-03-26 MED ORDER — DULOXETINE HCL 30 MG PO CPEP: 30.0000 mg | ORAL_CAPSULE | Freq: Every day | ORAL | 5 refills | Status: DC

## 2021-03-26 NOTE — Progress Notes (Signed)
[facility]  PATIENT: [name] [name] DOB: 08-12-1950  REFERRING DOCTOR OR PCP:  Ricke Hey SOURCE: patients and records form [facility] records  _________________________________   HISTORICAL  CHIEF COMPLAINT:  Chief Complaint  Patient presents with   Follow-up    New rm, alone. Pt reports of back pn due to a slip disc in L5. Pt reports that sometimes because of her pn and pn medication her memory/ concentration worsens.     HISTORY OF PRESENT ILLNESS:  [name] [name] is a 70 y.o. woman who reporting memory decline.   Update 03/26/2021 She feels memory is worse since she developed more back pain.    She is not on any pain medication.     She gets some benefit from methylphenidate 10 mg daily.  She rarely takes a second pill.   She does crafts and Sudoku and other games to keep hr mind active.   She was working - Medical sales representative.  She feels mood is doing worse since her LBP worsened.   She occasionally gets irritable.    She takes Lexapro 5 mg daily.  Her husband has irritability and anger issues.   She denies any TIA or stroke symptoms.   She is on Plavix and tolerates it well.  MRI of the brain shows confluent white matter changes consistent with severe chronic microvascular ischemic change.   She reports that her gait is stable,   No numbness or weakness.  Bladder is doing well.  She notes more pain in her back and neck.   The worse pai is in the lower back into the hips and down the legs but not to the foot.     She sometimes feels her laegs are mildly weak.     She had been prescribed hyoscyamine for GI cramps buthas not taken any x months.   Advised not totake as it could mildly affect cognition.    Vascular risks:   H/o smoking.  She does not have HTN or DM.     She had COvid x 2 .     IMAGING: MRI Brain 04/22/2017 1.    Brain volume is normal for age. 2.    Extensive T2/FLAIR hyperintense confluent changes in the hemispheres with some focal intense  foci in the subcortical/juxtacortical and anterior temporal lobe white matter.   Extensive changes in the pons.   The etiology is uncertain. This could represent severe chronic microvascular ischemic change will be due to a genetic leukodystrophy.    Further labwork might be of benefit. 3.    History of sinus surgery. Current mild left frontal chronic sinusitis. 4.    The pituitary gland is mildly increased in height. This could be an incidental normal finding but microadenoma is not ruled out. Consider repeat evaluation in the future with thin sections through this region MRI Lumbar spine 11/15/2020  IMPRESSION:  1.  Stable anterolisthesis L5 on S1 with severe central and lateral recess stenosis and moderate to severe bilateral foraminal stenosis. Also narrowing around the L5 nerves far laterally and around the dorsal branch of the L5 nerves bilaterally.  2.  Stable mild degenerative changes in the mid lumbar spine without significant stenosis or root compression.  3.  Stable superior central endplate depression at L3 with some retropulsion, but interval decrease in marrow edema.  4.  Synovial cysts at L5-S1. Lower lumbar interspinous degeneration as above.   REVIEW OF SYSTEMS: Constitutional: No fevers, chills, sweats, or change in appetite.  Some fatigue Eyes: No visual changes, double vision, eye pain Ear, nose and throat: No hearing loss, ear pain, nasal congestion, sore throat Cardiovascular: No chest pain, palpitations Respiratory:  reports shortness of breath and wheezes   No snores GastrointestinaI: No nausea, vomiting, diarrhea, abdominal pain, fecal incontinence Genitourinary:  No dysuria, urinary retention or frequency.  No nocturia. Musculoskeletal:  No neck pain, back pain but notes pain in the hips and knees. . Integumentary: No rash, pruritus, skin lesions Neurological: as above Psychiatric: Notes mild depression at this time but no anxiety Endocrine: No palpitations,  diaphoresis, change in appetite, change in weigh or increased thirst Hematologic/Lymphatic:  No anemia, purpura, petechiae. Allergic/Immunologic: No itchy/runny eyes, nasal congestion,   She has some seasonal allergies.  ALLERGIES: Allergies  Allergen Reactions   Aspirin Shortness Of Breath    HOME MEDICATIONS:  Current Outpatient Medications:    albuterol (PROAIR HFA) 108 (90 Base) MCG/ACT inhaler, Inhale 1-2 puffs into the lungs every 6 (six) hours as needed for wheezing or shortness of breath., Disp: 18 g, Rfl: 3   alendronate (FOSAMAX) 70 MG tablet, Take 70 mg by mouth once a week., Disp: , Rfl:    CALCIUM-VITAMIN D PO, Take 1 Dose by mouth daily. 800U VIt D, Disp: , Rfl:    clopidogrel (PLAVIX) 75 MG tablet, TAKE 1 TABLET DAILY, Disp: 90 tablet, Rfl: 1   methylphenidate (RITALIN) 10 MG tablet, Take one pill po up to three times a day, Disp: 90 tablet, Rfl: 0   pantoprazole (PROTONIX) 40 MG tablet, Take 1 tablet (40 mg total) by mouth 2 (two) times daily., Disp: 180 tablet, Rfl: 2   predniSONE (DELTASONE) 10 MG tablet, TAKE 1 TABLET DAILY, Disp: 90 tablet, Rfl: 3   promethazine (PHENERGAN) 25 MG suppository, PLACE 1 SUPPOSITORY (25 MG TOTAL) RECTALLY EVERY 12 (TWELVE) HOURS AS NEEDED FOR NAUSEA OR VOMITING., Disp: 6 suppository, Rfl: 0   rosuvastatin (CRESTOR) 10 MG tablet, Take 10 mg by mouth daily., Disp: , Rfl:    umeclidinium-vilanterol (ANORO ELLIPTA) 62.5-25 MCG/INH AEPB, Inhale 1 puff into the lungs daily., Disp: 35 each, Rfl: 5  PAST MEDICAL HISTORY: Past Medical History:  Diagnosis Date   Allergic rhinitis, cause unspecified    failed allergy vaccine   Allergy    Anxiety    panic attacks   Arthritis    COPD (chronic obstructive pulmonary disease) (HCC)    Depression    Diverticulosis    Extrinsic asthma, unspecified    GERD (gastroesophageal reflux disease)    Hiatal hernia    Polyp of nasal cavity    PONV (postoperative nausea and vomiting)    S/P dilatation of  esophageal stricture 08-2008   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   Unspecified sinusitis (chronic)    Vision abnormalities     PAST SURGICAL HISTORY: Past Surgical History:  Procedure Laterality Date   BUNIONECTOMY     left foot   CHOLECYSTECTOMY  05/05/2012   Procedure: LAPAROSCOPIC CHOLECYSTECTOMY WITH INTRAOPERATIVE CHOLANGIOGRAM;  Surgeon: Gayland Curry, MD,FACS;  Location: [facility];  Service: General;  Laterality: N/A;   COLONOSCOPY     COLONOSCOPY W/ BIOPSIES AND POLYPECTOMY     NASAL POLYP SURGERY     x 3    POLYPECTOMY     THUMB FUSION  10/13   rt hand  pinned but pin removed   TONSILLECTOMY      FAMILY HISTORY: Family History  Problem Relation Age of Onset   Colon cancer Maternal  Grandfather        unsure age of onset    Cancer Maternal Grandfather        colon   Stroke Maternal Aunt    Heart disease Father        Vague history   Prostate cancer Brother    Ataxia Daughter        died at age 1.5    SOCIAL HISTORY:  Social History   Socioeconomic History   Marital status: Married    Spouse name: Not on file   Number of children: 2   Years of education: Not on file   Highest education level: Not on file  Occupational History   Occupation: Theatre manager    Employer: [employer]  Tobacco Use   Smoking status: Former    Packs/day: 0.50    Years: 20.00    Pack years: 10.00    Types: Cigarettes    Quit date: 06/14/1988    Years since quitting: 32.8   Smokeless tobacco: Never  Substance and Sexual Activity   Alcohol use: No   Drug use: No   Sexual activity: Yes    Birth control/protection: Post-menopausal  Other Topics Concern   Not on file  Social History Narrative   Lives with husband.     Social Determinants of Health   Financial Resource Strain: Not on file  Food Insecurity: Not on file  Transportation Needs: Not on file  Physical Activity: Not on file  Stress: Not on file  Social Connections: Not on file  Intimate  Partner Violence: Not on file    PHYSICAL EXAM  Vitals:   03/26/21 1405  BP: (!) 169/77  Pulse: (!) 114  Weight: 161 lb 9.6 oz (73.3 kg)  Height: 5\' 4"  (1.626 m)    Body mass index is 27.74 kg/m.   General: The patient is well-developed and well-nourished and in no acute distress.  Head is [place]/AT   Skin: Extremities are without rash or  edema.   Neurologic Exam  Mental status: The patient is alert and oriented x 3 at the time of the examination. Today, the patient has apparent normal recent and remote memory, with mildly reduced concentration ability.   Speech is normal.  Cranial nerves: Extraocular movements are full. Facial strength is normal.  Trapezius and sternocleidomastoid strength is normal. No dysarthria is noted.    No obvious hearing deficits are noted.  Motor:  Muscle bulk is normal.   Tone is normal. Strength is  5 / 5 in all 4 extremities.   Sensory: Sensory testing is intact to touch  Coordination: Cerebellar testing reveals good finger-nose-finger   Gait and station: Station is normal.   Gait is more arthritic than last visit. . Tandem gait is mildly wide. Romberg is negative.   Reflexes: Deep tendon reflexes are symmetric and normal bilaterally.       ASSESSMENT AND PLAN  Memory difficulties  Attention deficit  Depression with anxiety  White matter abnormality on MRI of brain  Spondylolisthesis at L5-S1 level    1.   Continue Ritalin 10 mg up to bid.   Change  Lexapro for Cymbalta as it may help pain more 2.   Stay active physically and mentally.     3.   She will likely need lumbar fusion ([facility] in [place]) due to spondylolisthesis and other degenerative change. 4.    rtc 6 months sooner if new or wrosening issues  [place] Sarinana A. Felecia Shelling, MD, PhD 03/26/2021, 2:59 PM  Certified in Neurology, [place] Neurophysiology, Sleep Medicine, Pain Medicine and Neuroimaging  [facility] [address], [place], [place]  [address] [phone]

## 2021-04-19 ENCOUNTER — Other Ambulatory Visit: Payer: Self-pay | Admitting: Neurology

## 2021-04-20 ENCOUNTER — Other Ambulatory Visit: Payer: Self-pay | Admitting: Internal Medicine

## 2021-05-01 NOTE — Progress Notes (Signed)
Subjective:    Patient ID: [name] [name], female    DOB: Jul 03, 1950, 70 y.o.   MRN: [id number]  HPI  female former smoker followed for asthma/COPD, allergic rhinitis/history nasal polyps/aspirin allergy, recurrent sinusitis, complicated by GERD Office Spirometry 06/01/12- moderate obstructive airways disease. FVC 3.15/85%, FEV1 1.87/65%, FEV1/FVC 0.59% FEF 25-75% 0.92/36% PFT 02/01/08- we compared her old PFT-mild obstructive airways disease with minimal response to bronchodilator. FVC 4.14/107%, FEV1 2.83/98%, FEV1/FVC 0.68, FEF 25-75% 1.72/56%. Scores have declined. PFT 07/20/2012-moderate obstructive airways disease with response to bronchodilator, normal lung volumes, diffusion mildly reduced. FVC 3.87/107%, FEV1 2.37/88%, FEV1/FVC 0.61, FEF 25-75% 1.12/39%. TLC 104%, DLCO 71%. 6MWT 07/20/2012-98%, 97%, 98%, 444 m. Noted hip pain. No oxygen limitation. Echocardiogram 08/03/2012-EF 02-72%, gr 1 diastolic dysfunction. No pulmonary hypertension or wall motion abnormality FeNO-12/18/15- 13 PFT 08/03/2018-FVC 3.25/87%, FEV1 2.29/80%, ratio 0.70, FEF 25-75% 1.39/59%, NSC with dilator, TLC 98%, DLCO 88% Covid pneumonia 2020- CTchest HR- 05/15/2019-  1. 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 Saw Dr Vaughan Browner 10/24/19-back to baseline. He ordered HRCT( was due in June, 2021- not done), PFT and 6 MW w f/u by me. -----copd,states the heat,humidity causes some shob, feels breathing is better, improved Breathing now ok after resolving  Covid last winter, but heat and humidity bother her.  PFT 02/06/20- Diffusion mildly reduced (used Combivent inhaler pre-test, flows normal.  She will get f/u CT tracking NSIP at [facility].  05/04/21- 70 year old female former smoker (10 pkyrs) followed for asthma/COPD, ILD/NSIP, allergic rhinitis/history nasal polyps/aspirin allergy, recurrent sinusitis, complicated by GERD, Covid pneumonia 2020, Low Back Pain,  -Anoro, prednisone 10 mg daily, ProAir hfa,  Watching post-covid ILD   Covid vax-3 Moderna Flu vax-had Getting deconditioned as she is limited by back pain, pending lumbar spine surgery in December. Followed by neurology for memory decline she thinks associated with ischemic changes.  This raised discussion about oxygenation during sleep and we can check overnight oximetry. Second COVID infection in September treated with Paxlovid.  She asked if this might affect the known mild ILD.  ROS-see HPI   + = positive Constitutional:    weight loss, night sweats, fevers, chills, fatigue, lassitude. HEENT:    headaches, difficulty swallowing, tooth/dental problems, sore throat,       sneezing, itching, ear ache, +nasal congestion, post nasal drip, snoring CV:    chest pain, orthopnea, PND, swelling in lower extremities, anasarca,                                                    dizziness, palpitations Resp:  + shortness of breath with exertion or at rest.                productive cough, +  non-productive cough, coughing up of blood.              change in color of mucus.  wheezing.   Skin:    rash or lesions. GI:  No-   heartburn, indigestion, abdominal pain, nausea, vomiting,  GU:  MS:   joint pain, stiffness,  Neuro-     nothing  unusual Psych:  change in mood or affect.  depression or anxiety.   memory loss.  OBJ- Physical Exam General- Alert, Oriented, Affect-appropriate, Distress- none acute Skin- rash-none, lesions- none, excoriation- none Lymphadenopathy- none Head- atraumatic             Eyes- Gross vision intact, PERRLA, conjunctivae and secretions clear            Ears- Hearing, canals-normal            Nose- not- stuffy, no-Septal dev, mucus, polyps, erosion, perforation             Throat- Mallampati III , mucosa clear/not dry , drainage- none, tonsils- atrophic , + dentures, Neck- flexible , trachea midline, no stridor , thyroid nl, carotid no bruit Chest - symmetrical excursion , unlabored           Heart/CV- RRR , no murmur , no gallop  , no rub, nl s1 s2                           - JVD- none , edema- none, stasis changes- none, varices- none           Lung- clear to P&A, wheeze- none, cough +,  dullness-none, rub- none           Chest wall-  Abd-  Br/ Gen/ Rectal- Not done, not indicated Extrem- cyanosis- none, clubbing, none, atrophy- none, strength- nl Neuro- grossly intact to observation

## 2021-05-04 ENCOUNTER — Other Ambulatory Visit: Payer: Self-pay

## 2021-05-04 ENCOUNTER — Ambulatory Visit: Payer: Medicare (Managed Care) | Admitting: Internal Medicine

## 2021-05-04 ENCOUNTER — Encounter: Payer: Self-pay | Admitting: Internal Medicine

## 2021-05-04 VITALS — BP 150/90 | HR 100 | Temp 98.3°F | Ht 69.0 in | Wt 161.8 lb

## 2021-05-04 DIAGNOSIS — J449 Chronic obstructive pulmonary disease, unspecified: Secondary | ICD-10-CM | POA: Diagnosis not present

## 2021-05-04 DIAGNOSIS — J849 Interstitial pulmonary disease, unspecified: Secondary | ICD-10-CM | POA: Diagnosis not present

## 2021-05-04 NOTE — Patient Instructions (Signed)
Order- HRCT chest ILD  protocol    dx ILD  Order- schedule overnight oximetry   dx ILD  Good luck with your back surgery !Marland Kitchen

## 2021-05-04 NOTE — Assessment & Plan Note (Addendum)
No exacerbation and did well through second Covid infection.  Discussed meds.  I don't see pulmonary concern about planned lumbar spine surgery next month. Should have routine O2 saturation monitoring. Encouraged to be active again as able- PT likely to help. Plan- overnight oximetry

## 2021-05-04 NOTE — Assessment & Plan Note (Signed)
I don't hear significant crackle Plan- update HRCT chest

## 2021-05-26 ENCOUNTER — Ambulatory Visit (INDEPENDENT_AMBULATORY_CARE_PROVIDER_SITE_OTHER): Payer: Medicare (Managed Care)

## 2021-05-26 ENCOUNTER — Other Ambulatory Visit: Payer: Self-pay

## 2021-05-26 DIAGNOSIS — J849 Interstitial pulmonary disease, unspecified: Secondary | ICD-10-CM

## 2021-05-26 DIAGNOSIS — Z87891 Personal history of nicotine dependence: Secondary | ICD-10-CM

## 2021-05-26 DIAGNOSIS — Z8709 Personal history of other diseases of the respiratory system: Secondary | ICD-10-CM

## 2021-06-02 ENCOUNTER — Encounter: Payer: Self-pay | Admitting: *Deleted

## 2021-06-15 ENCOUNTER — Other Ambulatory Visit: Payer: Self-pay | Admitting: Neurology

## 2021-06-16 ENCOUNTER — Other Ambulatory Visit: Payer: Self-pay | Admitting: Family Medicine

## 2021-06-16 MED ORDER — METHYLPHENIDATE HCL 10 MG PO TABS
ORAL_TABLET | ORAL | 0 refills | Status: DC
Start: 2021-06-16 — End: 2021-09-16

## 2021-06-16 NOTE — Telephone Encounter (Signed)
I have routed this request to Dr Sater for review. The pt is due for the medication and [place] registry was verified.  

## 2021-06-16 NOTE — Telephone Encounter (Signed)
Pt requesting refill for methylphenidate (RITALIN) 10 MG tablet. Pharmacy [id number] - [address], [place]. AT [address] [place]

## 2021-07-02 ENCOUNTER — Ambulatory Visit: Payer: Medicare (Managed Care) | Admitting: Neurology

## 2021-09-06 ENCOUNTER — Other Ambulatory Visit: Payer: Self-pay | Admitting: Internal Medicine

## 2021-09-16 ENCOUNTER — Other Ambulatory Visit: Payer: Self-pay | Admitting: Neurology

## 2021-09-16 MED ORDER — METHYLPHENIDATE HCL 10 MG PO TABS: 10.0000 mg | ORAL_TABLET | Freq: Two times a day (BID) | ORAL | 0 refills | Status: DC

## 2021-09-16 NOTE — Addendum Note (Signed)
Addended by: Wyvonnia Lora on: 09/16/2021 12:59 PM ? ? Modules accepted: Orders ? ?

## 2021-09-16 NOTE — Telephone Encounter (Signed)
Pt is requesting a refill for methylphenidate (RITALIN) 10 MG tablet. ? ?Pharmacy: [id number] ? ?

## 2021-09-30 ENCOUNTER — Other Ambulatory Visit: Payer: Self-pay | Admitting: Neurology

## 2021-09-30 ENCOUNTER — Telehealth: Payer: Self-pay | Admitting: Adult Health

## 2021-09-30 MED ORDER — CLOPIDOGREL BISULFATE 75 MG PO TABS: 75.0000 mg | ORAL_TABLET | Freq: Every day | ORAL | 0 refills | Status: DC

## 2021-09-30 NOTE — Telephone Encounter (Signed)
Called the pt back. Advised that typically they GI MD likes to have [place] complete a surgical clearance form prior to procedure. Advised that want to hold the plavix 5 days leading up to procedure and then can restart after procedure.  ?She is on her last pill and her delivery from mail will not be there til 4/28 and she is asking if she can hold all the way until her procedure. I advised we can clarify this but likely they wont to hold longer than they need to. I have went ahead and sent over a 7 day fill to the pharmacy to hold her until she gets her shipment. Advised she can clarify instructions with Amy at her visit tomorrow and that she may want to inform the GI Doc in case they need out office to sign clearance form. Pt verbalized understanding. ?  ?

## 2021-09-30 NOTE — Telephone Encounter (Signed)
Pt has a coloscopy on May 1st. Pt wondering if she needs to stop taking her clopidogrel (PLAVIX) 75 MG tablet in preporation for procedure.  ?Pt requesting refill of clopidogrel (PLAVIX) 75 MG tablet from [place]. ?

## 2021-09-30 NOTE — Telephone Encounter (Signed)
Called the pt back. Advised that typically they GI MD likes to have [place] complete a surgical clearance form prior to procedure. Advised that want to hold the plavix 5 days leading up to procedure and then can restart after procedure.  ?She is on her last pill and her delivery from mail will not be there til 4/28 and she is asking if she can hold all the way until her procedure. I advised we can clarify this but likely they wont to hold longer than they need to. I have went ahead and sent over a 7 day fill to the pharmacy to hold her until she gets her shipment. Advised she can clarify instructions with Amy at her visit tomorrow and that she may want to inform the GI Doc in case they need out office to sign clearance form. Pt verbalized understanding. ? ?

## 2021-10-01 ENCOUNTER — Encounter: Payer: Self-pay | Admitting: Family Medicine

## 2021-10-01 ENCOUNTER — Ambulatory Visit: Payer: Medicare (Managed Care) | Admitting: Family Medicine

## 2021-10-01 VITALS — BP 160/88 | HR 108 | Ht 69.0 in | Wt 162.0 lb

## 2021-10-01 DIAGNOSIS — F418 Other specified anxiety disorders: Secondary | ICD-10-CM | POA: Diagnosis not present

## 2021-10-01 DIAGNOSIS — R41840 Attention and concentration deficit: Secondary | ICD-10-CM | POA: Diagnosis not present

## 2021-10-01 DIAGNOSIS — R9082 White matter disease, unspecified: Secondary | ICD-10-CM | POA: Diagnosis not present

## 2021-10-01 DIAGNOSIS — R413 Other amnesia: Secondary | ICD-10-CM

## 2021-10-01 NOTE — Progress Notes (Signed)
? ? ?Chief Complaint  ?Patient presents with  ? Follow-up  ?  rm 2, alone. Here for 6 month f/u for memory. Pt had back surgery in Dec and has slowed her down in her activities. No change in memory. MMSE:27  ? ? ?HISTORY OF PRESENT ILLNESS: ? ?10/01/21 ALL: ?[name] [name] returns for follow up for memory difficulty. She was last seen by Dr Felecia Shelling 03/2021. She reported worsening memory and irritability. She was advised to continue methyphenidate '10mg'$  up to twice daily and escitalopram was switched to duloxetine due to worsening mood in setting of low back pain.  ? ?Since, she feels that she is doing better. She is s/p lumbar spinal fusion. She feels pain is improving. She is walking without her cane. No falls. She continues to be cautious. She feels her balance is off at times. She feels that she has had to slow down a little which has bothered her. She continues methylphenidate '10mg'$  daily. She does not usually take second dose. She feels memory is stable. She is not sure if she started duloxetine. She does report taking escitalopram but unsure of dose.  ? ?She continues Plavix and rosuvastatin for stroke preventions. She is followed regularly by PCP. BP is usually well managed. She reports pain in tongue for the past few weeks and feels this is why it is elevated.  ? ?07/09/2020 ALL:  ?[name] [name] is a 71 y.o. female here today for follow up for memory difficulties. 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 No concerns for AZD at that time. She continues methylphenidate '10mg'$  daily and escitalopram '5mg'$  daily. She feels that she is doing ok. She is no longer working. She is considering stopping methylphenidate. She is having more trouble with acid reflux. Medications have been changed due to concerns of long term PPI and Plavix use. She was started on Pepcid recently and has follow up with GI next month. No stroke or TIA symptoms.  ? ? ?HISTORY (copied from  Dr Garth Bigness previous note) ? ?[name] [name] is a 71 y.o. woman who reporting memory decline.  ?  ?Update 01/02/2020: ?She feels memory is doing about the same.   She gets some benefit from methylphenidate 10 mg daily.  She rarely a second pill.   She tries to keep her mind active with crafts and Sudoku and other games.   She was working - Medical sales representative. ?  ? She feels mood is doing well.   She occasionally gets irritable.    She takes Lexapro 5 mg daily.  Her husband has irritability and anger issues.  ?  ?She denies any TIA or stroke symptoms.   She is on Plavix and tolerates it well.  MRI of the brain shows confluent white matter changes consistent with severe chronic microvascular ischemic change.   She reports that her gait is stable,   No numbness or weakness.  Bladder is doing well. ?  ?She had Covid summer 2020 and was hospitalized x 5 days in July/Aug.  She did not need ventilation but due to COPD and pneumonia needed to be observed.   ?Vascular risks:   H/o smoking.  She does not have HTN or DM.  ? ? ?REVIEW OF SYSTEMS: Out of a complete 14 system review of symptoms, the patient complains only of the following symptoms,memory loss, inattention, acid reflux, shoulder pain and all other reviewed systems are negative. ? ? ?ALLERGIES: ?Allergies  ?Allergen Reactions  ? Aspirin Shortness  Of Breath  ? ? ? ?HOME MEDICATIONS: ?Outpatient Medications Prior to Visit  ?Medication Sig Dispense Refill  ? albuterol (PROAIR HFA) 108 (90 Base) MCG/ACT inhaler Inhale 1-2 puffs into the lungs every 6 (six) hours as needed for wheezing or shortness of breath. 18 g 3  ? alendronate (FOSAMAX) 70 MG tablet Take 70 mg by mouth once a week.    ? ANORO ELLIPTA 62.5-25 MCG/ACT AEPB USE 1 INHALATION DAILY 180 each 3  ? CALCIUM-VITAMIN D PO Take 1 Dose by mouth daily. 800U VIt D    ? clopidogrel (PLAVIX) 75 MG tablet TAKE 1 TABLET DAILY 90 tablet 3  ? clopidogrel (PLAVIX) 75 MG tablet Take 1 tablet (75 mg total) by mouth daily. 7 tablet 0   ? DULoxetine (CYMBALTA) 30 MG capsule TAKE 1 CAPSULE BY MOUTH EVERY DAY 90 capsule 2  ? methylphenidate (RITALIN) 10 MG tablet Take 1 tablet (10 mg total) by mouth 2 (two) times daily. 60 tablet 0  ? pantoprazole (PROTONIX) 40 MG tablet Take 1 tablet (40 mg total) by mouth 2 (two) times daily. 180 tablet 2  ? predniSONE (DELTASONE) 10 MG tablet TAKE 1 TABLET DAILY 90 tablet 3  ? promethazine (PHENERGAN) 25 MG suppository PLACE 1 SUPPOSITORY (25 MG TOTAL) RECTALLY EVERY 12 (TWELVE) HOURS AS NEEDED FOR NAUSEA OR VOMITING. 6 suppository 0  ? rosuvastatin (CRESTOR) 10 MG tablet Take 10 mg by mouth daily.    ? ?No facility-administered medications prior to visit.  ? ? ? ?PAST MEDICAL HISTORY: ?Past Medical History:  ?Diagnosis Date  ? Allergic rhinitis, cause unspecified   ? failed allergy vaccine  ? Allergy   ? Anxiety   ? panic attacks  ? Arthritis   ? COPD (chronic obstructive pulmonary disease) ([place])   ? Depression   ? Diverticulosis   ? Extrinsic asthma, unspecified   ? GERD (gastroesophageal reflux disease)   ? Hiatal hernia   ? Polyp of nasal cavity   ? PONV (postoperative nausea and vomiting)   ? S/P dilatation of esophageal stricture 08-2008  ? 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 Unspecified sinusitis (chronic)   ? Vision abnormalities   ? ? ? ?PAST SURGICAL HISTORY: ?Past Surgical History:  ?Procedure Laterality Date  ? BUNIONECTOMY    ? left foot  ? CHOLECYSTECTOMY  05/05/2012  ? Procedure: LAPAROSCOPIC CHOLECYSTECTOMY WITH INTRAOPERATIVE CHOLANGIOGRAM;  Surgeon: Gayland Curry, MD,FACS;  Location: [facility];  Service: General;  Laterality: N/A;  ? COLONOSCOPY    ? COLONOSCOPY W/ BIOPSIES AND POLYPECTOMY    ? NASAL POLYP SURGERY    ? x 3   ? POLYPECTOMY    ? THUMB FUSION  10/13  ? rt hand  pinned but pin removed  ? TONSILLECTOMY    ? ? ? ?FAMILY HISTORY: ?Family History  ?Problem Relation Age of Onset  ? Colon cancer Maternal Grandfather   ?     unsure age of onset   ? Cancer Maternal Grandfather   ?      colon  ? Stroke Maternal Aunt   ? Heart disease Father   ?     Vague history  ? Prostate cancer Brother   ? Ataxia Daughter   ?     died at age 59.5  ? ? ? ?SOCIAL HISTORY: ?Social History  ? ?Socioeconomic History  ? Marital status: Married  ?  Spouse name: Not on file  ? Number of children: 2  ? Years of education: Not  on file  ? Highest education level: Not on file  ?Occupational History  ? Occupation: Theatre manager  ?  Employer: [employer]  ?Tobacco Use  ? Smoking status: Former  ?  Packs/day: 0.50  ?  Years: 20.00  ?  Pack years: 10.00  ?  Types: Cigarettes  ?  Quit date: 06/14/1988  ?  Years since quitting: 33.3  ? Smokeless tobacco: Never  ?Vaping Use  ? Vaping Use: Never used  ?Substance and Sexual Activity  ? Alcohol use: No  ? Drug use: No  ? Sexual activity: Yes  ?  Birth control/protection: Post-menopausal  ?Other Topics Concern  ? Not on file  ?Social History Narrative  ? Lives with husband.    ? ?Social Determinants of Health  ? ?Financial Resource Strain: Not on file  ?Food Insecurity: Not on file  ?Transportation Needs: Not on file  ?Physical Activity: Not on file  ?Stress: Not on file  ?Social Connections: Not on file  ?Intimate Partner Violence: Not on file  ? ? ? ? ?PHYSICAL EXAM ? ?Vitals:  ? 10/01/21 1425  ?BP: (!) 160/88  ?Pulse: (!) 108  ?Weight: 162 lb (73.5 kg)  ?Height: '5\' 9"'$  (1.753 m)  ? ? ?Body mass index is 23.92 kg/m?. ? ? ?Generalized: Well developed, in no acute distress ? ?Cardiology: normal rate and rhythm, no murmur auscultated  ?Respiratory: clear to auscultation bilaterally   ? ?Neurological examination  ?Mentation: Alert oriented to time, place, history taking. Follows all commands speech and language fluent ?Cranial nerve II-XII: Pupils were equal round reactive to light. Extraocular movements were full, visual field were full on confrontational test. Facial sensation and strength were normal. Head turning and shoulder shrug  were normal and symmetric. ?Motor: The  motor testing reveals 5 over 5 strength of all 4 extremities. Good symmetric motor tone is noted throughout.  ?Gait and station: Gait is stable without assistive device  ? ? ? ?DIAGNOSTIC DATA (LABS,

## 2021-10-01 NOTE — Patient Instructions (Addendum)
Below is our plan: ? ?We will continue methylphenidate '10mg'$  once daily. May take an extra '10mg'$  tablet as needed.  ? ?Please call me when you get home to let me know what medicaitons you are taking. Dr Felecia Shelling wanted you to stop escitalopram '5mg'$  and start duloxetine '30mg'$  daily. Let me know if you are taking these medications.  ? ?Please keep an eye on your blood pressure. Please let PCP know if your blood pressure continues to be elevated.  ? ?Please make sure you are staying well hydrated. I recommend 50-60 ounces daily. Well balanced diet and regular exercise encouraged. Consistent sleep schedule with 6-8 hours recommended.  ? ?Please continue follow up with care team as directed.  ? ?Follow up with me in 6 months  ? ?You may receive a survey regarding today's visit. I encourage you to leave honest feed back as I do use this information to improve patient care. Thank you for seeing me today!  ? ?Management of Memory Problems ?  ?There are some general things you can do to help manage your memory problems.  Your memory may not in fact recover, but by using techniques and strategies you will be able to manage your memory difficulties better. ?  ?1)  Establish a routine. ?Try to establish and then stick to a regular routine.  By doing this, you will get used to what to expect and you will reduce the need to rely on your memory.  Also, try to do things at the same time of day, such as taking your medication or checking your calendar first thing in the morning. ?Think about think that you can do as a part of a regular routine and make a list.  Then enter them into a daily planner to remind you.  This will help you establish a routine. ?  ?2)  Organize your environment. ?Organize your environment so that it is uncluttered.  Decrease visual stimulation.  Place everyday items such as keys or cell phone in the same place every day (ie.  Basket next to front door) ?Use post it notes with a brief message to yourself (ie. Turn  off light, lock the door) ?Use labels to indicate where things go (ie. Which cupboards are for food, dishes, etc.) ?Keep a notepad and pen by the telephone to take messages ?  ?3)  Memory Aids ?A diary or journal/notebook/daily planner ?Making a list (shopping list, chore list, to do list that needs to be done) ?Using an alarm as a reminder (kitchen timer or cell phone alarm) ?Using cell phone to store information (Notes, Calendar, Reminders) ?Calendar/White board placed in a prominent position ?Post-it notes ?  ?In order for memory aids to be useful, you need to have good habits.  It's no good remembering to make a note in your journal if you don't remember to look in it.  Try setting aside a certain time of day to look in journal. ?  ?4)  Improving mood and managing fatigue. ?There may be other factors that contribute to memory difficulties.  Factors, such as anxiety, depression and tiredness can affect memory. ?Regular gentle exercise can help improve your mood and give you more energy. ?Simple relaxation techniques may help relieve symptoms of anxiety ?Try to get back to completing activities or hobbies you enjoyed doing in the past. ?Learn to pace yourself through activities to decrease fatigue. ?Find out about some local support groups where you can share experiences with others. ?Try and achieve 7-8 hours  of sleep at night. ? ?

## 2021-10-19 ENCOUNTER — Encounter: Payer: Self-pay | Admitting: Internal Medicine

## 2021-10-19 NOTE — Telephone Encounter (Signed)
[facility] (supporting Baird Lyons D, MD) 2 hours ago (1:00 PM)  ? ?Want to get excuse classes here in [facility]. to bill-up my endurance after having back surgery   Thought a medical doctor would have to sign me up for these classes.  Thanks for your response. ? ? ?Dr. Annamaria Boots, please advise if okay to refer to pulmonary rehab. Thanks ?

## 2021-11-17 ENCOUNTER — Other Ambulatory Visit: Payer: Self-pay | Admitting: Family Medicine

## 2021-11-17 MED ORDER — METHYLPHENIDATE HCL 10 MG PO TABS: 10.0000 mg | ORAL_TABLET | Freq: Two times a day (BID) | ORAL | 0 refills | Status: DC

## 2021-11-17 NOTE — Telephone Encounter (Signed)
Pt called needing a refill request for her methylphenidate (RITALIN) 10 MG tablet sent in to the [facility] on [address].

## 2021-12-10 ENCOUNTER — Other Ambulatory Visit: Payer: Self-pay | Admitting: Neurology

## 2021-12-16 ENCOUNTER — Encounter: Payer: Self-pay | Admitting: Neurology

## 2022-01-07 ENCOUNTER — Encounter: Payer: Self-pay | Admitting: Internal Medicine

## 2022-01-08 NOTE — Telephone Encounter (Signed)
Dr. Annamaria Boots, please advise on pt's email. Thanks.

## 2022-01-08 NOTE — Telephone Encounter (Signed)
Ok to take NSAIDS, Tylenol or tramadol while on your maintenance prednisone. NSAIDS like ibuprofen or naprosyn can irritate thee stomach and cause bleeding, especially with prednisone. Take them no more often than necessary, and take them with food in your stomach.

## 2022-01-13 NOTE — Telephone Encounter (Signed)
Most recent mychart message sent by pt:  [facility] (supporting Deneise Lever, MD) 11 hours ago (9:31 PM)    I don't take ibuprofen and have taken tramdol and tramdol didn't help. Taken hydrocodone-acetamin, tizanidine hcl and gabapentin that didn't help either.  If these are NSAIDs.    These I have mentioned to [facility], too.  So I still don't have another for pain.  Cherly Anderson    Dr. Annamaria Boots, please advise.

## 2022-01-13 NOTE — Telephone Encounter (Signed)
I think the question was whether it would be ok to take an NSAID fotr pain, while on prednsione.  The answer is yes- ok to take ANY NSAID (those are meds like ibuprofen, diclofenac, naprosyn, indocin). Just important to protect the stomach lining so you don't get bleeding. Only take this type of pain medicine with food in your stomach, especially while on prednisone.

## 2022-01-15 ENCOUNTER — Encounter: Payer: Self-pay | Admitting: Neurology

## 2022-01-18 ENCOUNTER — Other Ambulatory Visit: Payer: Self-pay | Admitting: *Deleted

## 2022-01-18 MED ORDER — METHYLPHENIDATE HCL 10 MG PO TABS: 10.0000 mg | ORAL_TABLET | Freq: Two times a day (BID) | ORAL | 0 refills | Status: DC

## 2022-02-15 ENCOUNTER — Other Ambulatory Visit: Payer: Self-pay | Admitting: Internal Medicine

## 2022-02-16 NOTE — Telephone Encounter (Signed)
Prednisone refilled

## 2022-03-22 ENCOUNTER — Other Ambulatory Visit: Payer: Self-pay | Admitting: *Deleted

## 2022-03-22 ENCOUNTER — Encounter: Payer: Self-pay | Admitting: Neurology

## 2022-03-22 MED ORDER — METHYLPHENIDATE HCL 10 MG PO TABS: 10.0000 mg | ORAL_TABLET | Freq: Two times a day (BID) | ORAL | 0 refills | Status: DC

## 2022-04-01 NOTE — Patient Instructions (Signed)
Below is our plan:  We will continue duloxetine '30mg'$  daily and methylphenidate '10mg'$  daily. Try taking '5mg'$  twice daily if you wish. Keep a close eye on your BP and stay well hydrated.   Please make sure you are staying well hydrated. I recommend 50-60 ounces daily. Well balanced diet and regular exercise encouraged. Consistent sleep schedule with 6-8 hours recommended.   Please continue follow up with care team as directed.   Follow up with me in 6 months   You may receive a survey regarding today's visit. I encourage you to leave honest feed back as I do use this information to improve patient care. Thank you for seeing me today!   Management of Memory Problems   There are some general things you can do to help manage your memory problems.  Your memory may not in fact recover, but by using techniques and strategies you will be able to manage your memory difficulties better.   1)  Establish a routine. Try to establish and then stick to a regular routine.  By doing this, you will get used to what to expect and you will reduce the need to rely on your memory.  Also, try to do things at the same time of day, such as taking your medication or checking your calendar first thing in the morning. Think about think that you can do as a part of a regular routine and make a list.  Then enter them into a daily planner to remind you.  This will help you establish a routine.   2)  Organize your environment. Organize your environment so that it is uncluttered.  Decrease visual stimulation.  Place everyday items such as keys or cell phone in the same place every day (ie.  Basket next to front door) Use post it notes with a brief message to yourself (ie. Turn off light, lock the door) Use labels to indicate where things go (ie. Which cupboards are for food, dishes, etc.) Keep a notepad and pen by the telephone to take messages   3)  Memory Aids A diary or journal/notebook/daily planner Making a list  (shopping list, chore list, to do list that needs to be done) Using an alarm as a reminder (kitchen timer or cell phone alarm) Using cell phone to store information (Notes, Calendar, Reminders) Calendar/White board placed in a prominent position Post-it notes   In order for memory aids to be useful, you need to have good habits.  It's no good remembering to make a note in your journal if you don't remember to look in it.  Try setting aside a certain time of day to look in journal.   4)  Improving mood and managing fatigue. There may be other factors that contribute to memory difficulties.  Factors, such as anxiety, depression and tiredness can affect memory. Regular gentle exercise can help improve your mood and give you more energy. Exercise: there are short videos created by the [employer] on Health specially for older adults: https://bit.ly/2I30q97.  Mediterranean diet: which emphasizes fruits, vegetables, whole grains, legumes, fish, and other seafood; unsaturated fats such as olive oils; and low amounts of red meat, eggs, and sweets. A variation of this, called MIND ([place] Intervention for Neurodegenerative Delay) incorporates the DASH (Dietary Approaches to Stop Hypertension) diet, which has been shown to lower high blood pressure, a risk factor for Alzheimer's disease. More information at: [employer]For[employer].gl.  Aerobic exercise that improve heart health is also good for the mind.  National  Institute on Aging have short videos for exercises that you can do at home: GoldCloset.com.ee Simple relaxation techniques may help relieve symptoms of anxiety Try to get back to completing activities or hobbies you enjoyed doing in the past. Learn to pace yourself through activities to decrease fatigue. Find out about some local support groups where you can share experiences with others. Try and achieve 7-8  hours of sleep at night.

## 2022-04-01 NOTE — Progress Notes (Signed)
Chief Complaint  Patient presents with   Follow-up    Pt in room #16 and alone.Pt here today for f/u on her memory difficulties. Pt has hx of fainting on last Wednesday.    HISTORY OF PRESENT ILLNESS:  04/05/22 ALL: [name] [name] returns for follow up for memory deficits. She continues methyphenidate '10mg'$  daily (rx for BID) and duloxetine '30mg'$  daily. She feels that she is doing fairly well. No significant change in memory. Mood seems to be good. She reports having a fainting spell last week. PCP saw her and reported blood work stable. She started iron transfusions for anemia in 02/2022. She passed out shortly after transfusion while shopping at [place]. She reports drinking about 32 ounces of water daily. BP is up and down.   10/01/2021 ALL: [name] [name] returns for follow up for memory difficulty. She was last seen by Dr Felecia Shelling 03/2021. She reported worsening memory and irritability. She was advised to continue methyphenidate '10mg'$  up to twice daily and escitalopram was switched to duloxetine due to worsening mood in setting of low back pain.   Since, she feels that she is doing better. She is s/p lumbar spinal fusion. She feels pain is improving. She is walking without her cane. No falls. She continues to be cautious. She feels her balance is off at times. She feels that she has had to slow down a little which has bothered her. She continues methylphenidate '10mg'$  daily. She does not usually take second dose. She feels memory is stable. She is not sure if she started duloxetine. She does report taking escitalopram but unsure of dose.   She continues Plavix and rosuvastatin for stroke preventions. She is followed regularly by PCP. BP is usually well managed. She reports pain in tongue for the past few weeks and feels this is why it is elevated.   07/09/2020 ALL:  [name] [name] is a 71 y.o. female here today for follow up for memory difficulties. Neurocognitive eval in 2019 showed concerns for attention deficit  and mild vascular cognitive impairment. No concerns for AZD at that time. She continues methylphenidate '10mg'$  daily and escitalopram '5mg'$  daily. She feels that she is doing ok. She is no longer working. She is considering stopping methylphenidate. She is having more trouble with acid reflux. Medications have been changed due to concerns of long term PPI and Plavix use. She was started on Pepcid recently and has follow up with GI next month. No stroke or TIA symptoms.    HISTORY (copied from Dr Garth Bigness previous note)  [name] [name] is a 71 y.o. woman who reporting memory decline.    Update 01/02/2020: She feels memory is doing about the same.   She gets some benefit from methylphenidate 10 mg daily.  She rarely a second pill.   She tries to keep her mind active with crafts and Sudoku and other games.   She was working - Medical sales representative.    She feels mood is doing well.   She occasionally gets irritable.    She takes Lexapro 5 mg daily.  Her husband has irritability and anger issues.    She denies any TIA or stroke symptoms.   She is on Plavix and tolerates it well.  MRI of the brain shows confluent white matter changes consistent with severe chronic microvascular ischemic change.   She reports that her gait is stable,   No numbness or weakness.  Bladder is doing well.   She had Covid summer 2020 and was hospitalized x  5 days in July/Aug.  She did not need ventilation but due to COPD and pneumonia needed to be observed.   Vascular risks:   H/o smoking.  She does not have HTN or DM.    REVIEW OF SYSTEMS: Out of a complete 14 system review of symptoms, the patient complains only of the following symptoms,memory loss, inattention, acid reflux, shoulder pain and all other reviewed systems are negative.   ALLERGIES: Allergies  Allergen Reactions   Aspirin Shortness Of Breath     HOME MEDICATIONS: Outpatient Medications Prior to Visit  Medication Sig Dispense Refill   albuterol (PROAIR HFA) 108 (90  Base) MCG/ACT inhaler Inhale 1-2 puffs into the lungs every 6 (six) hours as needed for wheezing or shortness of breath. 18 g 3   alendronate (FOSAMAX) 70 MG tablet Take 70 mg by mouth once a week.     ANORO ELLIPTA 62.5-25 MCG/ACT AEPB USE 1 INHALATION DAILY 180 each 3   CALCIUM-VITAMIN D PO Take 1 Dose by mouth daily. 800U VIt D     clopidogrel (PLAVIX) 75 MG tablet TAKE 1 TABLET DAILY 90 tablet 3   clopidogrel (PLAVIX) 75 MG tablet Take 1 tablet (75 mg total) by mouth daily. 7 tablet 0   DULoxetine (CYMBALTA) 30 MG capsule TAKE 1 CAPSULE BY MOUTH EVERY DAY 90 capsule 2   pantoprazole (PROTONIX) 40 MG tablet Take 1 tablet (40 mg total) by mouth 2 (two) times daily. 180 tablet 2   predniSONE (DELTASONE) 10 MG tablet TAKE 1 TABLET DAILY 90 tablet 3   promethazine (PHENERGAN) 25 MG suppository PLACE 1 SUPPOSITORY (25 MG TOTAL) RECTALLY EVERY 12 (TWELVE) HOURS AS NEEDED FOR NAUSEA OR VOMITING. 6 suppository 0   rosuvastatin (CRESTOR) 10 MG tablet Take 10 mg by mouth daily.     methylphenidate (RITALIN) 10 MG tablet Take 1 tablet (10 mg total) by mouth 2 (two) times daily. 60 tablet 0   No facility-administered medications prior to visit.     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   Unspecified sinusitis (chronic)    Vision abnormalities      PAST SURGICAL HISTORY: Past Surgical History:  Procedure Laterality Date   BUNIONECTOMY     left foot   CHOLECYSTECTOMY  05/05/2012   Procedure: LAPAROSCOPIC  CHOLECYSTECTOMY WITH INTRAOPERATIVE CHOLANGIOGRAM;  Surgeon: Gayland Curry, MD,FACS;  Location: [facility] OR;  Service: General;  Laterality: N/A;   COLONOSCOPY     COLONOSCOPY W/ BIOPSIES AND POLYPECTOMY     NASAL POLYP SURGERY     x 3    POLYPECTOMY     THUMB FUSION  10/13   rt hand  pinned but pin removed   TONSILLECTOMY       FAMILY HISTORY: Family History  Problem Relation Age of Onset   Colon cancer Maternal Grandfather        unsure age of onset    Cancer Maternal Grandfather  colon   Stroke Maternal Aunt    Heart disease Father        Vague history   Prostate cancer Brother    Ataxia Daughter        died at age 14.5     SOCIAL HISTORY: Social History   Socioeconomic History   Marital status: Married    Spouse name: Not on file   Number of children: 2   Years of education: Not on file   Highest education level: Not on file  Occupational History   Occupation: Theatre manager    Employer: [employer]  Tobacco Use   Smoking status: Former    Packs/day: 0.50    Years: 20.00    Total pack years: 10.00    Types: Cigarettes    Quit date: 06/14/1988    Years since quitting: 33.8   Smokeless tobacco: Never  Vaping Use   Vaping Use: Never used  Substance and Sexual Activity   Alcohol use: No   Drug use: No   Sexual activity: Yes    Birth control/protection: Post-menopausal  Other Topics Concern   Not on file  Social History Narrative   Lives with husband.     Social Determinants of Health   Financial Resource Strain: Not on file  Food Insecurity: Not on file  Transportation Needs: Not on file  Physical Activity: Not on file  Stress: Not on file  Social Connections: Not on file  Intimate Partner Violence: Not on file      PHYSICAL EXAM  Vitals:   04/05/22 1425  BP: (!) 158/87  Pulse: (!) 114  Weight: 165 lb (74.8 kg)  Height: '5\' 9"'$  (1.753 m)     Body mass index is 24.37 kg/m.   Generalized: Well developed, in no acute  distress  Cardiology: normal rate and rhythm, no murmur auscultated  Respiratory: clear to auscultation bilaterally    Neurological examination  Mentation: Alert oriented to time, place, history taking. Follows all commands speech and language fluent Cranial nerve II-XII: Pupils were equal round reactive to light. Extraocular movements were full, visual field were full on confrontational test. Facial sensation and strength were normal. Head turning and shoulder shrug  were normal and symmetric. Motor: The motor testing reveals 5 over 5 strength of all 4 extremities. Good symmetric motor tone is noted throughout.  Gait and station: Gait is stable without assistive device    DIAGNOSTIC DATA (LABS, IMAGING, TESTING) - I reviewed patient records, labs, notes, testing and imaging myself where available.  Lab Results  Component Value Date   WBC 6.8 01/02/2020   HGB 12.5 01/02/2020   HCT 37.8 01/02/2020   MCV 82 01/02/2020   PLT 332 01/02/2020      Component Value Date/Time   NA 141 01/02/2020 1448   K 4.5 01/02/2020 1448   CL 104 01/02/2020 1448   CO2 24 01/02/2020 1448   GLUCOSE 109 (H) 01/02/2020 1448   GLUCOSE 95 11/02/2017 1117   BUN 21 01/02/2020 1448   CREATININE 1.17 (H) 01/02/2020 1448   CALCIUM 9.4 01/02/2020 1448   PROT 6.8 01/02/2020 1448   ALBUMIN 4.7 01/02/2020 1448   AST 17 01/02/2020 1448   ALT 9 01/02/2020 1448   ALKPHOS 78 01/02/2020 1448   BILITOT 0.3 01/02/2020 1448   GFRNONAA 48 (L) 01/02/2020 1448   GFRAA 55 (L) 01/02/2020 1448   Lab Results  Component Value Date   CHOL 228 (H) 09/01/2012   HDL 60.00 09/01/2012  LDLDIRECT 146.4 09/01/2012   TRIG 167.0 (H) 09/01/2012   CHOLHDL 4 09/01/2012   Lab Results  Component Value Date   HGBA1C 5.7 09/01/2012   Lab Results  Component Value Date   VITAMINB12 507 10/02/2014   Lab Results  Component Value Date   TSH 0.560 01/02/2020      04/05/2022    2:32 PM 10/01/2021    2:28 PM 07/09/2020    2:00  PM  MMSE - Mini Mental State Exam  Orientation to time '5 4 4  '$ Orientation to Place '5 5 5  '$ Registration '3 3 3  '$ Attention/ Calculation '5 3 3  '$ Recall '3 3 2  '$ Language- name 2 objects '2 2 2  '$ Language- repeat '1 1 1  '$ Language- follow 3 step command '3 3 2  '$ Language- read & follow direction '1 1 1  '$ Write a sentence '1 1 1  '$ Copy design '1 1 1  '$ Total score '30 27 25     '$ ASSESSMENT AND PLAN  71 y.o. year old female  has a past medical history of Allergic rhinitis, cause unspecified, Allergy, Anxiety, Arthritis, COPD (chronic obstructive pulmonary disease) ([place]), Depression, Diverticulosis, Extrinsic asthma, unspecified, GERD (gastroesophageal reflux disease), Hiatal hernia, Polyp of nasal cavity, PONV (postoperative nausea and vomiting), S/P dilatation of esophageal stricture (08-2008), Tubulovillous adenoma polyp of colon (08-2008), Unspecified sinusitis (chronic), and Vision abnormalities. here with   Memory difficulties  Depression with anxiety  Sama is doing well, today. Methylphenidate seems to help with attention and focus. Memory is stable. MMSE 30/30. PDMP shows appropriate refills. Last refill for 60 tablet on 03/22/2022. I will update rx for current dose of '10mg'$  daily. She may take 1/2 tablet ('5mg'$ ) twice daily if she prefers. Continue duloxetine '30mg'$  daily. She will keep a close eye on her blood pressure at home. She was advised to call PCP for consistently elevated readings. She will continue healthy lifestyle habits. I have encouraged her to increase water intake. Continue close follow up with PCP. Follow up with me in 6 months. She verbalizes understanding and agreement with this plan.    No orders of the defined types were placed in this encounter.  Meds ordered this encounter  Medications   methylphenidate (RITALIN) 10 MG tablet    Sig: Take 1 tablet (10 mg total) by mouth daily.    Dispense:  30 tablet    Refill:  0    Order Specific Question:   Supervising Provider    Answer:    Melvenia Beam [[id number]]     GYB WLSLH, MSN, FNP-C 04/05/2022, 2:58 PM  [facility] [address] [place], [place] [address] [phone]

## 2022-04-05 ENCOUNTER — Ambulatory Visit (INDEPENDENT_AMBULATORY_CARE_PROVIDER_SITE_OTHER): Payer: Medicare (Managed Care) | Admitting: Family Medicine

## 2022-04-05 ENCOUNTER — Encounter: Payer: Self-pay | Admitting: Family Medicine

## 2022-04-05 VITALS — BP 158/87 | HR 114 | Ht 69.0 in | Wt 165.0 lb

## 2022-04-05 DIAGNOSIS — R413 Other amnesia: Secondary | ICD-10-CM | POA: Diagnosis not present

## 2022-04-05 DIAGNOSIS — F418 Other specified anxiety disorders: Secondary | ICD-10-CM | POA: Diagnosis not present

## 2022-04-05 MED ORDER — METHYLPHENIDATE HCL 10 MG PO TABS: 10.0000 mg | ORAL_TABLET | Freq: Every day | ORAL | 0 refills | Status: DC

## 2022-05-28 ENCOUNTER — Other Ambulatory Visit: Payer: Self-pay | Admitting: Neurology

## 2022-05-28 ENCOUNTER — Other Ambulatory Visit: Payer: Self-pay | Admitting: Family Medicine

## 2022-05-28 NOTE — Telephone Encounter (Signed)
Pt request refill for methylphenidate (RITALIN) 10 MG tablet at  [id number]

## 2022-05-31 MED ORDER — METHYLPHENIDATE HCL 10 MG PO TABS: 10.0000 mg | ORAL_TABLET | Freq: Every day | ORAL | 0 refills | Status: DC

## 2022-05-31 NOTE — Telephone Encounter (Signed)
Last seen 04/05/22 and next f/u 10/05/22. Per drug registry, last refilled 03/22/22 #60.

## 2022-06-15 ENCOUNTER — Telehealth: Payer: Self-pay | Admitting: Family Medicine

## 2022-06-15 NOTE — Telephone Encounter (Signed)
Pt request refill for methylphenidate (RITALIN) 10 MG tablet at  [id number]

## 2022-06-15 NOTE — Telephone Encounter (Signed)
Last seen 04/05/22 and next f/u 10/05/22. Per drug registry, last refilled 05/31/22 #30. She is not due for refill until 06/30/22.   It is a little early to send refill.

## 2022-06-17 NOTE — Telephone Encounter (Signed)
Please call patient and ask her when she ran out of her Ritalin and if she was taking more than was prescribed.  According to the last note from October 2023 she was taking 1 pill daily whereas it was prescribed for twice daily prn, which should technically mean that she has extra rather than running out.  Please get more information, as of right now I am unable to refill this medication early as it is a controlled substance and high risk medication.

## 2022-06-17 NOTE — Telephone Encounter (Signed)
Dr. Rexene Alberts, how would you like to proceed?

## 2022-06-17 NOTE — Telephone Encounter (Addendum)
Called pt. She misplaced bottle that she refilled on 05/31/22. Unable to refill again until closer to 06/28/22. Pt will try looking again at home for misplaced bottle. Aware we do not refill controlled prescriptions early.

## 2022-06-17 NOTE — Telephone Encounter (Signed)
Phone rep called pt and relayed message from [place], [place].  Pt states she is out of the medication, and she is unable to find the medication bottle.  Phone rep repeated what RN stated, but also informed her that she would tell RN that she is out of the medication.

## 2022-06-24 ENCOUNTER — Encounter: Payer: Self-pay | Admitting: Internal Medicine

## 2022-06-24 ENCOUNTER — Ambulatory Visit: Payer: Medicare (Managed Care) | Admitting: Internal Medicine

## 2022-06-24 VITALS — BP 132/84 | HR 90 | Ht 69.0 in | Wt 163.6 lb

## 2022-06-24 DIAGNOSIS — J449 Chronic obstructive pulmonary disease, unspecified: Secondary | ICD-10-CM | POA: Diagnosis not present

## 2022-06-24 DIAGNOSIS — J849 Interstitial pulmonary disease, unspecified: Secondary | ICD-10-CM | POA: Diagnosis not present

## 2022-06-24 MED ORDER — PREDNISONE 5 MG PO TABS: ORAL_TABLET | ORAL | 3 refills | Status: DC

## 2022-06-24 NOTE — Progress Notes (Signed)
Subjective:    Patient ID: [name] [name], female    DOB: 11-Nov-1950, 72 y.o.   MRN: [id number]  HPI  female former smoker followed for asthma/COPD, allergic rhinitis/history nasal polyps/aspirin allergy, recurrent sinusitis, complicated by GERD Office Spirometry 06/01/12- moderate obstructive airways disease. FVC 3.15/85%, FEV1 1.87/65%, FEV1/FVC 0.59% FEF 25-75% 0.92/36% PFT 02/01/08- we compared her old PFT-mild obstructive airways disease with minimal response to bronchodilator. FVC 4.14/107%, FEV1 2.83/98%, FEV1/FVC 0.68, FEF 25-75% 1.72/56%. Scores have declined. PFT 07/20/2012-moderate obstructive airways disease with response to bronchodilator, normal lung volumes, diffusion mildly reduced. FVC 3.87/107%, FEV1 2.37/88%, FEV1/FVC 0.61, FEF 25-75% 1.12/39%. TLC 104%, DLCO 71%. 6MWT 07/20/2012-98%, 97%, 98%, 444 m. Noted hip pain. No oxygen limitation. Echocardiogram 08/03/2012-EF 0000000, gr 1 diastolic dysfunction. No pulmonary hypertension or wall motion abnormality FeNO-12/18/15- 13 PFT 08/03/2018-FVC 3.25/87%, FEV1 2.29/80%, ratio 0.70, FEF 25-75% 1.39/59%, NSC with dilator, TLC 98%, DLCO 88% Covid pneumonia 2020- CTchest HR- 05/15/2019-  1. 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 Followed by neurology for memory decline she thinks  associated with ischemic changes.  This raised discussion about oxygenation during sleep and we can check overnight oximetry. Second COVID infection in September treated with Paxlovid.  She asked if this might affect the known mild ILD.  06/24/22- 72 year old female former smoker (10 pkyrs) followed for asthma/COPD, ILD/NSIP, allergic rhinitis/history nasal polyps/aspirin allergy, recurrent sinusitis, complicated by GERD, Covid pneumonia 2020, Low Back Pain, Osteoporosis, CAD,  -Anoro, prednisone 10 mg daily, ProAir hfa,  ONOX 05/15/21-Time 88% or less 4.5 minutes Watching post-covid ILD   Covid vax-3 Moderna  Flu vax-had ED 05/21/22- multiple bilateral rib fxs and midback compression fx recent. Rx'd by ortho with opioids causing constipation addressed by her primary care. Broke ribs moving furniture to put up Christmas tree- denies falls.  Her breathing feels stable.  Albuterol and Anoro are managed by her primary care provider now.  We talked about reducing her maintenance prednisone. CT chest [facility] 05/21/22- CONCLUSION: 1.  No acute findings in the thorax. No acute rib fractures. Multiple bilateral chronic or late subacute fractures. Callus in a left fourth rib fracture may be more subacute. 2.  Improved pattern of organizing pneumonia compared to 2021. Residual interstitial and groundglass opacities likely fibrotic. 3.  Refer to thoracic spine CT for additional findings. Exam End: 05/21/22 00:45   Specimen Collected: 05/21/22 01:51 Last Resulted: 05/21/22 08:42  Received From: [facility]  Result Received: 05/28/22 08:32   HRCT 05/27/21- 2022 IMPRESSION: 1. 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 Findings are suggestive of an alternative diagnosis (not UIP) per consensus guidelines: Diagnosis of Idiopathic Pulmonary Fibrosis: An Official ATS/ERS/JRS/ALAT  Clinical Practice Guideline. [place], Iss 5, [id number], Feb 12 2017. 2. Aortic atherosclerosis (ICD10-I70.0). Coronary artery calcification.  ROS-see HPI   + = positive Constitutional:    weight loss, night sweats, fevers, chills, fatigue, lassitude. HEENT:    headaches, difficulty swallowing, tooth/dental problems,  sore throat,       sneezing, itching, ear ache, +nasal congestion, post nasal drip, snoring CV:    chest pain, orthopnea, PND, swelling in lower extremities, anasarca,                                                    dizziness, palpitations Resp:  + shortness of breath with exertion or at rest.                productive cough, +  non-productive cough, coughing up of blood.              change in color of mucus.  wheezing.   Skin:    rash or lesions. GI:  No-   heartburn, indigestion, abdominal pain, nausea, vomiting,  GU:  MS:   joint pain, stiffness,  Neuro-     nothing unusual Psych:  change in mood or affect.  depression or anxiety.   memory loss.  OBJ- Physical Exam General- Alert, Oriented, Affect-appropriate, Distress- none acute Skin- rash-none, lesions- none, excoriation- none Lymphadenopathy- none Head- atraumatic            Eyes- Gross vision intact, PERRLA, conjunctivae and secretions clear            Ears- Hearing, canals-normal            Nose- not- stuffy, no-Septal dev, mucus, polyps, erosion, perforation             Throat- Mallampati III , mucosa clear/not dry , drainage- none, tonsils- atrophic , + dentures, Neck- flexible , trachea midline, no stridor , thyroid nl, carotid no bruit Chest - symmetrical excursion , unlabored           Heart/CV- RRR , no murmur , no gallop  , no rub, nl s1 s2                           - JVD- none , edema- none, stasis changes- none, varices- none           Lung- clear to P&A, wheeze- none, cough +,  dullness-none, rub- none           Chest wall-  Abd-  Br/ Gen/ Rectal- Not done, not  indicated Extrem- cyanosis- none, clubbing, none, atrophy- none, strength- nl Neuro- grossly intact to observation

## 2022-06-24 NOTE — Patient Instructions (Signed)
We are changing your prednisone to a lower dose. Prescriptions sent to [facility] for 5 mg tabs. Take 2 tabs (10 mg) one day, alternating with 1 tab (5 mg) the next and continue like that alternating every other day. It may help to keep track if you mark a calendar.  Ok to continue your inhalers.

## 2022-07-05 ENCOUNTER — Other Ambulatory Visit: Payer: Self-pay | Admitting: Neurology

## 2022-07-05 NOTE — Telephone Encounter (Signed)
Patient follow scheduled on 10/05/22 with Amy Lomax.

## 2022-07-18 ENCOUNTER — Other Ambulatory Visit: Payer: Self-pay | Admitting: Family Medicine

## 2022-07-19 NOTE — Telephone Encounter (Addendum)
Follow up scheduled on 10/05/22 with Amy Lomax Last filled on 05/31/22 #30 tablets Rx pending for you to sign

## 2022-07-20 MED ORDER — METHYLPHENIDATE HCL 10 MG PO TABS: 10.0000 mg | ORAL_TABLET | Freq: Every day | ORAL | 0 refills | Status: DC

## 2022-07-27 ENCOUNTER — Encounter: Payer: Self-pay | Admitting: Internal Medicine

## 2022-07-27 NOTE — Assessment & Plan Note (Signed)
She is stable with Anoro.  We discussed reducing prednisone Plan-reduce prednisone to alternate 10 mg with 5 mg every other day

## 2022-07-27 NOTE — Assessment & Plan Note (Signed)
HRCT 05/27/21- 2022 1. 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 Findings are suggestive of an alternative diagnosis (not UIP) Plan-follow conservatively

## 2022-08-19 ENCOUNTER — Encounter: Payer: Self-pay | Admitting: Internal Medicine

## 2022-08-23 ENCOUNTER — Other Ambulatory Visit: Payer: Self-pay | Admitting: Family Medicine

## 2022-08-23 MED ORDER — METHYLPHENIDATE HCL 10 MG PO TABS: 10.0000 mg | ORAL_TABLET | Freq: Every day | ORAL | 0 refills | Status: DC

## 2022-08-23 NOTE — Telephone Encounter (Signed)
Received the following message from patient:   "Lately, I having been having a wheezy noise coming from my throat (I am not having a asthma attack but the sound makes me fell like I am wheezing).  I will use my inhaler that makes me feel better for the monment but is there anything I can do to not use my inhaler.  The sound (wheezing) is come right from my throat area.    Thanks"  She was asked if she had been coughing or more SOB as well as how long this symptom has been doing on. Below is her response:   " I feel short winded for when I use my inhaler I feel better but it just feels tight in the throat."  Dr. Annamaria Boots, can you please advise? Thanks!

## 2022-08-23 NOTE — Telephone Encounter (Signed)
I can't tell what this is. Please get her in as able to see me or an APP.

## 2022-09-21 ENCOUNTER — Encounter: Payer: Self-pay | Admitting: Internal Medicine

## 2022-09-22 NOTE — Telephone Encounter (Signed)
Methylhenidate handled by Shawnie Dapper for pt. Pt instructed to reach out the Amy's office for refills. Nothing further needed at this time.

## 2022-09-23 ENCOUNTER — Other Ambulatory Visit: Payer: Self-pay

## 2022-09-23 MED ORDER — DULOXETINE HCL 30 MG PO CPEP: ORAL_CAPSULE | ORAL | 0 refills | Status: DC

## 2022-09-29 NOTE — Patient Instructions (Signed)
Below is our plan:  We will continue methylphenidate , duloxetine  and Plavix  daily. Please keep medications in a safe place out of reach of children and animals.   Please make sure you are staying well hydrated. I recommend 50-60 ounces daily. Well balanced diet and regular exercise encouraged. Consistent sleep schedule with 6-8 hours recommended.   Please continue follow up with care team as directed.   Follow up with me in 6 months   You may receive a survey regarding today's visit. I encourage you to leave honest feed back as I do use this information to improve patient care. Thank you for seeing me today!   Management of Memory Problems   There are some general things you can do to help manage your memory problems.  Your memory may not in fact recover, but by using techniques and strategies you will be able to manage your memory difficulties better.   1)  Establish a routine. Try to establish and then stick to a regular routine.  By doing this, you will get used to what to expect and you will reduce the need to rely on your memory.  Also, try to do things at the same time of day, such as taking your medication or checking your calendar first thing in the morning. Think about think that you can do as a part of a regular routine and make a list.  Then enter them into a daily planner to remind you.  This will help you establish a routine.   2)  Organize your environment. Organize your environment so that it is uncluttered.  Decrease visual stimulation.  Place everyday items such as keys or cell phone in the same place every day (ie.  Basket next to front door) Use post it notes with a brief message to yourself (ie. Turn off light, lock the door) Use labels to indicate where things go (ie. Which cupboards are for food, dishes, etc.) Keep a notepad and pen by the telephone to take messages   3)  Memory Aids A diary or journal/notebook/daily planner Making a list (shopping  list, chore list, to do list that needs to be done) Using an alarm as a reminder (kitchen timer or cell phone alarm) Using cell phone to store information (Notes, Calendar, Reminders) Calendar/White board placed in a prominent position Post-it notes   In order for memory aids to be useful, you need to have good habits.  It's no good remembering to make a note in your journal if you don't remember to look in it.  Try setting aside a certain time of day to look in journal.   4)  Improving mood and managing fatigue. There may be other factors that contribute to memory difficulties.  Factors, such as anxiety, depression and tiredness can affect memory. Regular gentle exercise can help improve your mood and give you more energy. Exercise: there are short videos created by the [employer] on Health specially for older adults: https://bit.ly/2I30q97.  Mediterranean diet: which emphasizes fruits, vegetables, whole grains, legumes, fish, and other seafood; unsaturated fats such as olive oils; and low amounts of red meat, eggs, and sweets. A variation of this, called MIND (Mediterranean-DASH Intervention for Neurodegenerative Delay) incorporates the DASH (Dietary Approaches to Stop Hypertension) diet, which has been shown to lower high blood pressure, a risk factor for Alzheimer's disease. More information at: ExitMarketing.de.  Aerobic exercise that improve heart health is also good for the mind.  [employer] on Aging have  short videos for exercises that you can do at home: BlindWorkshop.com.pt Simple relaxation techniques may help relieve symptoms of anxiety Try to get back to completing activities or hobbies you enjoyed doing in the past. Learn to pace yourself through activities to decrease fatigue. Find out about some local support groups where you can share experiences with others. Try and achieve 7-8 hours of  sleep at night.

## 2022-09-29 NOTE — Progress Notes (Signed)
Chief Complaint  Patient presents with   Follow-up    RM 1, alone. Last seen 04/05/22. Last MMSE 30/30. Today's MMSE: 28/30 (16 yr of education)  Thinks puppy got her methylphenidate that she [facility] a week and a [facility]. Wanting to discuss getting replacement rx for this. Feels short term memory worse. Example: yesterday she was going to wash white clothes. She had two piles in two different rooms and forgot to wash one of the piles.    HISTORY OF PRESENT ILLNESS:  10/05/22 ALL: [name] [name] returns for follow up. She has continued methylphenidate  and duloxetine  daily until recently (thinks the past month). She reports getting a puppy who she thinks has gotten her bottle of methylphenidate and moved it. She reports filling rx about 10 days ago but PDMP reports last refill 08/23/2022 for 30 tablets. She does not suspect the dog has taken any medication but she can not find the bottle. She reports being out of duloxetine as she thought it was on automatic refills but wasn't. She has requested a refill and waiting for pharmacy to get it ready. She does note being more forgetful over the past few weeks. She was diagnosed with a compression fracture 05/2022 and on oxycodone short term for pain management. Recently started on Fosamax for osteoporosis. S/P lumbar fusion 05/2021. She has not been sleeping as well. PCP recommended melatonin that does help. She was seen in follow up 09/27/2022 and started on doxepin. Unsure that she has filled this med. Back pain has resolved. She has had ringing in both ears. Recently seen by audiology and diagnosed with hearing loss. She continues Plavix and rosuvastatin. Last LDL 61 06/2022.  04/05/2022 ALL: [name] [name] returns for follow up for memory deficits. She continues methyphenidate  daily (rx for BID) and duloxetine  daily. She feels that she is doing fairly well. No significant change in memory. Mood seems to be good. She reports having  a fainting spell last week. PCP saw her and reported blood work stable. She started iron transfusions for anemia in 02/2022. She passed out shortly after transfusion while shopping at [place]. She reports drinking about 32 ounces of water daily. BP is up and down.   10/01/2021 ALL: [name] [name] returns for follow up for memory difficulty. She was last seen by Dr Epimenio Foot 03/2021. She reported worsening memory and irritability. She was advised to continue methyphenidate  up to twice daily and escitalopram was switched to duloxetine due to worsening mood in setting of low back pain.   Since, she feels that she is doing better. She is s/p lumbar spinal fusion. She feels pain is improving. She is walking without her cane. No falls. She continues to be cautious. She feels her balance is off at times. She feels that she has had to slow down a little which has bothered her. She continues methylphenidate  daily. She does not usually take second dose. She feels memory is stable. She is not sure if she started duloxetine. She does report taking escitalopram but unsure of dose.   She continues Plavix and rosuvastatin for stroke preventions. She is followed regularly by PCP. BP is usually well managed. She reports pain in tongue for the past few weeks and feels this is why it is elevated.   07/09/2020 ALL:  [name] [name] is a 72 y.o. female here today for follow up for memory difficulties. Neurocognitive eval in 2019 showed concerns for attention deficit and mild vascular cognitive  impairment. No concerns for AZD at that time. She continues methylphenidate 10mg  daily and escitalopram 5mg  daily. She feels that she is doing ok. She is no longer working. She is considering stopping methylphenidate. She is having more trouble with acid reflux. Medications have been changed due to concerns of long term PPI and Plavix use. She was started on Pepcid recently and has follow up with GI next month. No stroke or TIA symptoms.     HISTORY (copied from Dr Bonnita Hollow previous note)  [name] [name] is a 72 y.o. woman who reporting memory decline.    Update 01/02/2020: She feels memory is doing about the same.   She gets some benefit from methylphenidate 10 mg daily.  She rarely a second pill.   She tries to keep her mind active with crafts and Sudoku and other games.   She was working - Warehouse manager.    She feels mood is doing well.   She occasionally gets irritable.    She takes Lexapro 5 mg daily.  Her husband has irritability and anger issues.    She denies any TIA or stroke symptoms.   She is on Plavix and tolerates it well.  MRI of the brain shows confluent white matter changes consistent with severe chronic microvascular ischemic change.   She reports that her gait is stable,   No numbness or weakness.  Bladder is doing well.   She had Covid summer 2020 and was hospitalized x 5 days in July/Aug.  She did not need ventilation but due to COPD and pneumonia needed to be observed.   Vascular risks:   H/o smoking.  She does not have HTN or DM.    REVIEW OF SYSTEMS: Out of a complete 14 system review of symptoms, the patient complains only of the following symptoms,memory loss, inattention, insomnia and all other reviewed systems are negative.   ALLERGIES: Allergies  Allergen Reactions   Aspirin Shortness Of Breath     HOME MEDICATIONS: Outpatient Medications Prior to Visit  Medication Sig Dispense Refill   albuterol (PROAIR HFA) 108 (90 Base) MCG/ACT inhaler Inhale 1-2 puffs into the lungs every 6 (six) hours as needed for wheezing or shortness of breath. 18 g 3   alendronate (FOSAMAX) 70 MG tablet Take by mouth.     ANORO ELLIPTA 62.5-25 MCG/ACT AEPB USE 1 INHALATION DAILY 180 each 3   CALCIUM-VITAMIN D PO Take 1 Dose by mouth daily. 800U VIt D     clopidogrel (PLAVIX) 75 MG tablet TAKE 1 TABLET DAILY 90 tablet 3   pantoprazole (PROTONIX) 40 MG tablet Take 1 tablet (40 mg total) by mouth 2 (two) times daily. 180  tablet 2   predniSONE (DELTASONE) 5 MG tablet Alternate 2 tabs with 1 tab every other day 140 tablet 3   promethazine (PHENERGAN) 25 MG suppository PLACE 1 SUPPOSITORY (25 MG TOTAL) RECTALLY EVERY 12 (TWELVE) HOURS AS NEEDED FOR NAUSEA OR VOMITING. 6 suppository 0   rosuvastatin (CRESTOR) 10 MG tablet Take 10 mg by mouth daily.     DULoxetine (CYMBALTA) 30 MG capsule TAKE 1 CAPSULE BY MOUTH EVERY DAY 90 capsule 0   methylphenidate (RITALIN) 10 MG tablet Take 1 tablet (10 mg total) by mouth daily. 30 tablet 0   calcitonin, salmon, (MIACALCIN/FORTICAL) 200 UNIT/ACT nasal spray one spray daily.     clopidogrel (PLAVIX) 75 MG tablet Take 1 tablet (75 mg total) by mouth daily. 7 tablet 0   oxyCODONE (OXY IR/ROXICODONE) 5 MG immediate release tablet Take  5 mg by mouth every 6 (six) hours as needed.     tiZANidine (ZANAFLEX) 2 MG tablet Take 2 mg by mouth every 6 (six) hours as needed.     No facility-administered medications prior to visit.     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   Unspecified sinusitis (chronic)    Vision abnormalities      PAST SURGICAL HISTORY: Past Surgical History:  Procedure Laterality Date   BUNIONECTOMY     left foot   CHOLECYSTECTOMY  05/05/2012   Procedure: LAPAROSCOPIC CHOLECYSTECTOMY WITH INTRAOPERATIVE CHOLANGIOGRAM;  Surgeon: Atilano Ina, MD,FACS;  Location: [facility] OR;  Service: General;  Laterality: N/A;   COLONOSCOPY     COLONOSCOPY W/ BIOPSIES AND POLYPECTOMY     NASAL  POLYP SURGERY     x 3    POLYPECTOMY     THUMB FUSION  10/13   rt hand  pinned but pin removed   TONSILLECTOMY       FAMILY HISTORY: Family History  Problem Relation Age of Onset   Colon cancer Maternal Grandfather        unsure age of onset    Cancer Maternal Grandfather        colon   Stroke Maternal Aunt    Heart disease Father        Vague history   Prostate cancer Brother    Ataxia Daughter        died at age 43.5     SOCIAL HISTORY: Social History   Socioeconomic History   Marital status: Married    Spouse name: Not on file   Number of children: 2   Years of education: Not on file   Highest education level: Not on file  Occupational History   Occupation: Art gallery manager    Employer: [employer]  Tobacco Use   Smoking status: Former    Packs/day: 0.50    Years: 20.00    Additional pack years: 0.00    Total pack years: 10.00    Types: Cigarettes    Quit date: 06/14/1988    Years since quitting: 34.3   Smokeless tobacco: Never  Vaping Use   Vaping Use: Never used  Substance and Sexual Activity   Alcohol use: No   Drug use: No   Sexual activity: Yes    Birth control/protection: Post-menopausal  Other Topics Concern   Not on file  Social History Narrative   Lives with husband.     Social Determinants of Health   Financial Resource Strain: Not on file  Food Insecurity: Not on file  Transportation Needs: Not on file  Physical Activity: Not on file  Stress: Not on file  Social Connections: Not on file  Intimate Partner Violence: Not on file      PHYSICAL EXAM  Vitals:   10/05/22 1401  BP: 130/77  Pulse: 93  Weight: 156 lb 12.8 oz (71.1 kg)  Height: 5\' 9"  (  1.753 m)      Body mass index is 23.16 kg/m.   Generalized: Well developed, in no acute distress  Cardiology: normal rate and rhythm, no murmur auscultated  Respiratory: clear to auscultation bilaterally    Neurological examination  Mentation: Alert oriented to  time, place, history taking. Follows all commands speech and language fluent Cranial nerve II-XII: Pupils were equal round reactive to light. Extraocular movements were full, visual field were full on confrontational test. Facial sensation and strength were normal. Head turning and shoulder shrug  were normal and symmetric. Motor: The motor testing reveals 5 over 5 strength of all 4 extremities. Good symmetric motor tone is noted throughout.  Gait and station: Gait is stable without assistive device    DIAGNOSTIC DATA (LABS, IMAGING, TESTING) - I reviewed patient records, labs, notes, testing and imaging myself where available.  Lab Results  Component Value Date   WBC 6.8 01/02/2020   HGB 12.5 01/02/2020   HCT 37.8 01/02/2020   MCV 82 01/02/2020   PLT 332 01/02/2020      Component Value Date/Time   NA 141 01/02/2020 1448   K 4.5 01/02/2020 1448   CL 104 01/02/2020 1448   CO2 24 01/02/2020 1448   GLUCOSE 109 (H) 01/02/2020 1448   GLUCOSE 95 11/02/2017 1117   BUN 21 01/02/2020 1448   CREATININE 1.17 (H) 01/02/2020 1448   CALCIUM 9.4 01/02/2020 1448   PROT 6.8 01/02/2020 1448   ALBUMIN 4.7 01/02/2020 1448   AST 17 01/02/2020 1448   ALT 9 01/02/2020 1448   ALKPHOS 78 01/02/2020 1448   BILITOT 0.3 01/02/2020 1448   GFRNONAA 48 (L) 01/02/2020 1448   GFRAA 55 (L) 01/02/2020 1448   Lab Results  Component Value Date   CHOL 228 (H) 09/01/2012   HDL 60.00 09/01/2012   LDLDIRECT 146.4 09/01/2012   TRIG 167.0 (H) 09/01/2012   CHOLHDL 4 09/01/2012   Lab Results  Component Value Date   HGBA1C 5.7 09/01/2012   Lab Results  Component Value Date   VITAMINB12 507 10/02/2014   Lab Results  Component Value Date   TSH 0.560 01/02/2020      10/05/2022    2:11 PM 04/05/2022    2:32 PM 10/01/2021    2:28 PM  MMSE - Mini Mental State Exam  Orientation to time 4 5 4   Orientation to Place 5 5 5   Registration 3 3 3   Attention/ Calculation 4 5 3   Recall 3 3 3   Language- name 2  objects 2 2 2   Language- repeat 1 1 1   Language- follow 3 step command 3 3 3   Language- read & follow direction 1 1 1   Write a sentence 1 1 1   Copy design 1 1 1   Total score 28 30 27      ASSESSMENT AND PLAN  72 y.o. 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 here with   Memory difficulties  Depression with anxiety  Attention deficit  Alexxis is doing well, today. Methylphenidate seems to help with attention and focus. She feels memory has worsening since being off for the past few weeks. MMSE 28/30, previously 30/30. PDMP shows appropriate refills. Last refill  for 30 tablet on 08/23/2022. I have discussed importance of keeping this medication in a safe place and away from children or animals. I will place a refill, today. She will continue duloxetine  daily. She will continue healthy lifestyle habits. Continue close follow up with PCP. Follow up with me in 6 months. She verbalizes understanding and agreement with this plan.    No orders of the defined types were placed in this encounter.  Meds ordered this encounter  Medications   methylphenidate (RITALIN) 10 MG tablet    Sig: Take 1 tablet (10 mg total) by mouth daily.    Dispense:  30 tablet    Refill:  0    Order Specific Question:   Supervising Provider    Answer:   Anson Fret [[id number]]   DULoxetine (CYMBALTA) 30 MG capsule    Sig: TAKE 1 CAPSULE BY MOUTH EVERY DAY    Dispense:  90 capsule    Refill:  1    Order Specific Question:   Supervising Provider    Answer:    Anson Fret [id number]    I spent 30 minutes of face-to-face and non-face-to-face time with patient.  This included previsit chart review, lab review, study review, order entry, electronic health record documentation, patient education.   Shawnie Dapper, MSN, FNP-C 10/05/2022, 4:01 PM  [facility] [address] [place], [place] [address] [phone]

## 2022-10-05 ENCOUNTER — Ambulatory Visit: Payer: Medicare (Managed Care) | Admitting: Family Medicine

## 2022-10-05 ENCOUNTER — Encounter: Payer: Self-pay | Admitting: Family Medicine

## 2022-10-05 VITALS — BP 130/77 | HR 93 | Ht 69.0 in | Wt 156.8 lb

## 2022-10-05 DIAGNOSIS — R41840 Attention and concentration deficit: Secondary | ICD-10-CM | POA: Diagnosis not present

## 2022-10-05 DIAGNOSIS — R413 Other amnesia: Secondary | ICD-10-CM | POA: Diagnosis not present

## 2022-10-05 DIAGNOSIS — F418 Other specified anxiety disorders: Secondary | ICD-10-CM

## 2022-10-05 MED ORDER — DULOXETINE HCL 30 MG PO CPEP: ORAL_CAPSULE | ORAL | 1 refills | Status: DC

## 2022-10-05 MED ORDER — METHYLPHENIDATE HCL 10 MG PO TABS: 10.0000 mg | ORAL_TABLET | Freq: Every day | ORAL | 0 refills | Status: DC

## 2022-10-06 ENCOUNTER — Encounter: Payer: Self-pay | Admitting: Family Medicine

## 2022-10-11 ENCOUNTER — Encounter: Payer: Self-pay | Admitting: Family Medicine

## 2022-10-13 ENCOUNTER — Ambulatory Visit: Payer: Medicare (Managed Care) | Admitting: Primary Care

## 2022-10-14 ENCOUNTER — Telehealth: Payer: Self-pay | Admitting: Internal Medicine

## 2022-10-14 NOTE — Telephone Encounter (Signed)
[facility] [address]    PT calling stating she had to "Up" her Pred and wanted Dr. Maple Hudson to know. How should she proceed? Please call @ [phone]

## 2022-10-19 NOTE — Telephone Encounter (Signed)
Let's try Breo- thanks

## 2022-10-19 NOTE — Telephone Encounter (Signed)
Called and spoke with pt who states she was taking 10mg  prednisone one day then every other day she was taking 5mg .   Instead of alternating doing 10mg  one day then doing 5mg , pt said she is now taking 10mg  prednisone every day.  With this, pt wants to know how she should proceed as she said the prednisone was reduced due to her osteoporosis.  Pt said that when on the lower dose, she began wheezing more and was also having to use her albuterol more and due to that, she went back up to 10mg .   Dr. Maple Hudson, please advise on this for pt.   Allergies  Allergen Reactions   Aspirin Shortness Of Breath     Current Outpatient Medications:    albuterol (PROAIR HFA) 108 (90 Base) MCG/ACT inhaler, Inhale 1-2 puffs into the lungs every 6 (six) hours as needed for wheezing or shortness of breath., Disp: 18 g, Rfl: 3   alendronate (FOSAMAX) 70 MG tablet, Take by mouth., Disp: , Rfl:    ANORO ELLIPTA 62.5-25 MCG/ACT AEPB, USE 1 INHALATION DAILY, Disp: 180 each, Rfl: 3   CALCIUM-VITAMIN D PO, Take 1 Dose by mouth daily. 800U VIt D, Disp: , Rfl:    clopidogrel (PLAVIX) 75 MG tablet, TAKE 1 TABLET DAILY, Disp: 90 tablet, Rfl: 3   DULoxetine (CYMBALTA) 30 MG capsule, TAKE 1 CAPSULE BY MOUTH EVERY DAY, Disp: 90 capsule, Rfl: 1   methylphenidate (RITALIN) 10 MG tablet, Take 1 tablet (10 mg total) by mouth daily., Disp: 30 tablet, Rfl: 0   pantoprazole (PROTONIX) 40 MG tablet, Take 1 tablet (40 mg total) by mouth 2 (two) times daily., Disp: 180 tablet, Rfl: 2   predniSONE (DELTASONE) 5 MG tablet, Alternate 2 tabs with 1 tab every other day, Disp: 140 tablet, Rfl: 3   promethazine (PHENERGAN) 25 MG suppository, PLACE 1 SUPPOSITORY (25 MG TOTAL) RECTALLY EVERY 12 (TWELVE) HOURS AS NEEDED FOR NAUSEA OR VOMITING., Disp: 6 suppository, Rfl: 0   rosuvastatin (CRESTOR) 10 MG tablet, Take 10 mg by mouth daily., Disp: , Rfl:

## 2022-10-19 NOTE — Telephone Encounter (Signed)
Looks like pt was taking Anoro. Do you still want her to switch from the Anoro to [place]?

## 2022-10-19 NOTE — Telephone Encounter (Signed)
Suggest we order maintenance inhaler Breo 100    # 60, inhale 1 puff then rinse mouth,once daily, refill x 5 See if using that every day will allow her after a week or so, to drop back prednisone to alternating 10 mg with 5 mg every og=there day again.

## 2022-10-21 ENCOUNTER — Other Ambulatory Visit: Payer: Self-pay | Admitting: *Deleted

## 2022-10-21 MED ORDER — FLUTICASONE FUROATE-VILANTEROL 100-25 MCG/ACT IN AEPB: 1.0000 | INHALATION_SPRAY | Freq: Every day | RESPIRATORY_TRACT | 5 refills | Status: DC

## 2022-10-21 NOTE — Telephone Encounter (Signed)
Called and spoke with patient, advised of recommendations per Dr. Maple Hudson.  Verified pharmacy and script sent.  Nothing further needed.

## 2022-11-07 ENCOUNTER — Encounter: Payer: Self-pay | Admitting: Internal Medicine

## 2022-11-09 MED ORDER — METHYLPHENIDATE HCL 10 MG PO TABS: 10.0000 mg | ORAL_TABLET | Freq: Every day | ORAL | 0 refills | Status: DC

## 2022-11-09 NOTE — Telephone Encounter (Signed)
Methylphenidate refilled [address]

## 2022-11-30 ENCOUNTER — Encounter: Payer: Self-pay | Admitting: Internal Medicine

## 2022-12-02 NOTE — Telephone Encounter (Signed)
Dr. Maple Hudson, pt is asking if tapering down pred can cause depression  Please advise, thanks!

## 2022-12-03 NOTE — Telephone Encounter (Signed)
Tapering prednisone can cause feelings of fatigue and weakness that might seem like depression.

## 2022-12-06 ENCOUNTER — Other Ambulatory Visit: Payer: Self-pay | Admitting: Internal Medicine

## 2022-12-13 ENCOUNTER — Encounter: Payer: Self-pay | Admitting: Family Medicine

## 2022-12-13 ENCOUNTER — Other Ambulatory Visit: Payer: Self-pay

## 2022-12-13 ENCOUNTER — Encounter: Payer: Self-pay | Admitting: Internal Medicine

## 2022-12-13 MED ORDER — METHYLPHENIDATE HCL 10 MG PO TABS: 10.0000 mg | ORAL_TABLET | Freq: Every day | ORAL | 0 refills | Status: DC

## 2022-12-13 NOTE — Telephone Encounter (Signed)
Pt last seen 10/05/2022 Upcoming Appointment 04/07/2023  Ritalin last filled 11/09/2022 Escript 12/13/2022

## 2022-12-13 NOTE — Telephone Encounter (Signed)
Methylphenidate refilled at [facility]

## 2023-01-13 ENCOUNTER — Encounter: Payer: Self-pay | Admitting: Internal Medicine

## 2023-01-18 MED ORDER — METHYLPHENIDATE HCL 10 MG PO TABS: 10.0000 mg | ORAL_TABLET | Freq: Every day | ORAL | 0 refills | Status: DC

## 2023-01-18 NOTE — Telephone Encounter (Signed)
Methylphenidate refilled 

## 2023-02-14 ENCOUNTER — Encounter: Payer: Self-pay | Admitting: Internal Medicine

## 2023-02-15 NOTE — Telephone Encounter (Signed)
So now are you using Breo with prednisone 10 mg daily?  Suggest we send compressor nebulizer and Duoneb 360 ml,  ref x 12    use 1 neb, once or twice daily in addition to [facility] and prednisone

## 2023-02-22 ENCOUNTER — Other Ambulatory Visit: Payer: Self-pay

## 2023-02-22 DIAGNOSIS — J449 Chronic obstructive pulmonary disease, unspecified: Secondary | ICD-10-CM

## 2023-02-22 DIAGNOSIS — J849 Interstitial pulmonary disease, unspecified: Secondary | ICD-10-CM

## 2023-03-01 ENCOUNTER — Encounter: Payer: Self-pay | Admitting: Family Medicine

## 2023-03-03 MED ORDER — METHYLPHENIDATE HCL 10 MG PO TABS: 10.0000 mg | ORAL_TABLET | Freq: Every day | ORAL | 0 refills | Status: DC

## 2023-03-03 NOTE — Telephone Encounter (Signed)
Dr.Camara you are work in provider this am Last seen on 04/05/22 per note " Methylphenidate seems to help with attention and focus " Follow up scheduled on 04/07/23 Rx last filled on 01/22/23 #30 tablets (30 day supply) it was filled by Dr. Maple Hudson (pulmonologist ) Rx pending to be signed.     I called and spoke with patient she is aware to stick with one provider when refilling this Rx. Pt said she couldn't find Dr. Epimenio Foot name to send refill via my chart. I explained Amy lomax has filled in past and she will need to select her name. Pt verbalized she understood.

## 2023-03-10 NOTE — Telephone Encounter (Signed)
She can decline the nebulizer machine if he doesn't want it. In that case please also remove DuoNeb from her med listt.

## 2023-03-18 ENCOUNTER — Encounter: Payer: Self-pay | Admitting: Internal Medicine

## 2023-03-18 ENCOUNTER — Ambulatory Visit: Payer: Medicare (Managed Care) | Admitting: Internal Medicine

## 2023-03-18 VITALS — BP 150/73 | HR 111 | Temp 98.4°F | Ht 69.0 in | Wt 152.6 lb

## 2023-03-18 DIAGNOSIS — R0602 Shortness of breath: Secondary | ICD-10-CM

## 2023-03-18 DIAGNOSIS — J8410 Pulmonary fibrosis, unspecified: Secondary | ICD-10-CM | POA: Diagnosis not present

## 2023-03-18 DIAGNOSIS — Z87891 Personal history of nicotine dependence: Secondary | ICD-10-CM | POA: Diagnosis not present

## 2023-03-18 LAB — POCT EXHALED NITRIC OXIDE: FeNO level (ppb): 9

## 2023-03-18 MED ORDER — ALBUTEROL SULFATE HFA 108 (90 BASE) MCG/ACT IN AERS: 2.0000 | INHALATION_SPRAY | Freq: Four times a day (QID) | RESPIRATORY_TRACT | 2 refills | Status: AC | PRN

## 2023-03-18 MED ORDER — FLUTICASONE-SALMETEROL 250-50 MCG/ACT IN AEPB: 1.0000 | INHALATION_SPRAY | Freq: Two times a day (BID) | RESPIRATORY_TRACT | 5 refills | Status: DC

## 2023-03-18 NOTE — Patient Instructions (Addendum)
Follow up 3 months with Dr. Maple Hudson.   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 We can try a different inhaler to see if this helps. Stop the Breo and switch to [facility] 2 puffs twice daily. This will also treat asthma/copd. [facility].  Continue ventolin as needed - [facility].  Continue coming off the prednisone as discussed with him.   Make sure you get your flu shot this fall.

## 2023-03-18 NOTE — Progress Notes (Unsigned)
[name] [name]    [id number]    1950/06/18  Primary Care Physician:Derisse, Wynona Canes, MD Date of Appointment: 03/21/2023 Established Patient Visit  Chief complaint:   Chief Complaint  Patient presents with   Follow-up     HPI: [name] [name] is a 72 y.o. woman with former smokin on chronic prednisone. She also has post covid inflammatory fibrosis.  Interval Updates: Here for an acute visit but then says she has been having shortness of breath for the past 3 months. She is a patient of Dr. Maple Hudson. She is on long term prednisone and was in the process of tapering off the prednisone. She was put on Breo instead of prednisone.  She has been on prednisone for over 40 years. She is currently on 5 mg every other day alternating with 10 mg. She is on this for asthma.    She is more short of breath, having tightness   I have reviewed the patient's family social and past medical history and updated as appropriate.   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   Unspecified sinusitis (chronic)    Vision abnormalities     Past Surgical History:  Procedure Laterality Date   BUNIONECTOMY     left foot   CHOLECYSTECTOMY  05/05/2012   Procedure: LAPAROSCOPIC CHOLECYSTECTOMY WITH INTRAOPERATIVE CHOLANGIOGRAM;  Surgeon: Atilano Ina, MD,FACS;  Location: [facility] OR;  Service: General;  Laterality: N/A;   COLONOSCOPY     COLONOSCOPY W/ BIOPSIES AND POLYPECTOMY     NASAL POLYP SURGERY     x 3    POLYPECTOMY     THUMB  FUSION  10/13   rt hand  pinned but pin removed   TONSILLECTOMY      Family History  Problem Relation Age of Onset   Colon cancer Maternal Grandfather        unsure age of onset    Cancer Maternal Grandfather        colon   Stroke Maternal Aunt    Heart disease Father        Vague history   Prostate cancer Brother    Ataxia Daughter        died at age 30.5    Social History   Occupational History   Occupation: Advertising account planner: [employer]  Tobacco Use   Smoking status: Former    Current packs/day: 0.00    Average packs/day: 0.5 packs/day for 20.0 years (10.0 ttl pk-yrs)    Types: Cigarettes    Start date: 06/14/1968    Quit date: 06/14/1988    Years since quitting: 34.7   Smokeless tobacco: Never  Vaping Use   Vaping status: Never Used  Substance and Sexual Activity   Alcohol use: No   Drug use: No   Sexual activity: Yes    Birth control/protection: Post-menopausal     Physical Exam: Blood pressure (!) 150/73, pulse Marland Kitchen)  111, temperature 98.4 F (36.9 C), temperature source Oral, height 5\' 9"  (1.753 m), weight 152 lb 9.6 oz (69.2 kg), SpO2 95%.  Gen:      No acute distress ENT:  no nasal polyps, mucus membranes moist Lungs:    No increased respiratory effort, symmetric chest wall excursion, clear to auscultation bilaterally, no wheezes or crackles CV:         Regular rate and rhythm; no murmurs, rubs, or gallops.  No pedal edema   Data Reviewed: Imaging: I have personally reviewed the Dec 2022 Hi res CT Chest which shows diffuse ground glass opacities with traction bronchiectasis. No honeycombing.   PFTs:     Latest Ref Rng & Units 02/06/2020    9:11 AM 08/03/2018   10:07 AM  PFT Results  FVC-Pre L 3.09  3.34   FVC-Predicted Pre % 83  89   FVC-Post L 3.21  3.25   FVC-Predicted Post % 86  87   Pre FEV1/FVC % % 76  69   Post FEV1/FCV % % 76  70   FEV1-Pre L 2.35  2.31   FEV1-Predicted Pre % 83  81   FEV1-Post L 2.44  2.29   DLCO  uncorrected ml/min/mmHg 15.47  20.53   DLCO UNC% % 66  88   DLCO corrected ml/min/mmHg 15.92    DLCO COR %Predicted % 68    DLVA Predicted % 81  91   TLC L 5.04  5.72   TLC % Predicted % 86  98   RV % Predicted % 69  92    I have personally reviewed the patient's PFTs and moderately reduced diffusion capacity.   Labs: Lab Results  Component Value Date   WBC 6.8 01/02/2020   HGB 12.5 01/02/2020   HCT 37.8 01/02/2020   MCV 82 01/02/2020   PLT 332 01/02/2020   Lab Results  Component Value Date   NA 141 01/02/2020   K 4.5 01/02/2020   CL 104 01/02/2020   CO2 24 01/02/2020      Immunization status: Immunization History  Administered Date(s) Administered   Fluad Quad(high Dose 65+) 03/26/2019, 04/06/2021   Influenza Nasal 03/05/2018   Influenza Split 05/14/2013, 03/26/2014, 03/25/2015, 03/27/2016, 03/26/2019   Influenza, High Dose Seasonal PF 03/26/2019, 02/13/2020, 02/13/2020, 02/20/2020   Influenza, Quadrivalent, Recombinant, Inj, Pf 03/27/2016, 03/27/2019   Influenza, Seasonal, Injecte, Preservative Fre 03/27/2016, 03/05/2018, 03/26/2019   Influenza,inj,Quad PF,6+ Mos 03/05/2018   Influenza,trivalent, recombinat, inj, PF 03/27/2016   Influenza-Unspecified 03/26/2014, 03/26/2014, 03/27/2016, 03/26/2019, 04/06/2021, 03/31/2022   Moderna Covid-19 Vaccine Bivalent Booster 62yrs & up 04/05/2022   Moderna Sars-Covid-2 Vaccination 08/01/2019, 08/29/2019, 03/18/2020   PFIZER(Purple Top)SARS-COV-2 Vaccination 06/17/2021   Pneumococcal Polysaccharide-23 09/01/2012, 06/14/2013, 03/14/2016, 03/30/2018   Pneumococcal-Unspecified 09/01/2012, 06/14/2013, 03/14/2016, 03/30/2018   Tdap 09/01/2012    External Records Personally Reviewed: pulmonary  Assessment:  Post covid fibrosis with worsening symptoms.  Oral glucocorticoid dependence  Osteoporosis  Plan/Recommendations:  Feno obtained today 9 ppb  Your CT scan shows changes consistent with post-covid fibrosis/scarring. We  can try a different inhaler to see if this helps. Stop the Breo and switch to [facility] 2 puffs twice daily. This will also treat asthma/copd. [facility]  Continue ventolin as needed - [facility].  Continue coming off the prednisone as discussed with him.   Make sure you get your flu shot this fall.    Return to Care: Return in about  3 months (around 06/18/2023) for dr young.   Durel Salts, MD Pulmonary and Critical Care Medicine [facility] Office:[phone]

## 2023-03-20 ENCOUNTER — Encounter: Payer: Self-pay | Admitting: Internal Medicine

## 2023-03-21 ENCOUNTER — Encounter: Payer: Self-pay | Admitting: Internal Medicine

## 2023-04-06 ENCOUNTER — Encounter: Payer: Self-pay | Admitting: Family Medicine

## 2023-04-06 MED ORDER — METHYLPHENIDATE HCL 10 MG PO TABS: 10.0000 mg | ORAL_TABLET | Freq: Every day | ORAL | 0 refills | Status: DC

## 2023-04-06 NOTE — Telephone Encounter (Signed)
Pt appointment was changed to 10/31/22. Last seen on 10/05/22 Last filled on 03/09/23 #30 tablets (30 days) Rx pending to be signed

## 2023-04-07 ENCOUNTER — Ambulatory Visit: Payer: Medicare (Managed Care) | Admitting: Family Medicine

## 2023-04-11 MED ORDER — METHYLPHENIDATE HCL 10 MG PO TABS: 10.0000 mg | ORAL_TABLET | Freq: Every day | ORAL | 0 refills | Status: DC

## 2023-04-11 NOTE — Telephone Encounter (Signed)
Amy can you signed Rx pt said the other pharmacy did not have Rx in stock. I checked and Rx has not been dispensed since 03/09/23.  Rx pending to be signed to the requested pharmacy.

## 2023-04-11 NOTE — Addendum Note (Signed)
Addended by: Aura Camps on: 04/11/2023 11:21 AM   Modules accepted: Orders

## 2023-05-02 NOTE — Progress Notes (Deleted)
No chief complaint on file.   HISTORY OF PRESENT ILLNESS:  05/02/23 ALL: [name] [name] returns for follow up for memory loss and depression. She was last seen 09/2022 and had been off methylphenidate and duloxetine for about a month. We restarted medications and advised 6 months follow up. Since,   PDMP shows appropriate refills.   10/05/2022 ALL:  [name] [name] returns for follow up. She has continued methylphenidate 10mg  and duloxetine 30mg  daily until recently (thinks the past month). She reports getting a puppy who she thinks has gotten her bottle of methylphenidate and moved it. She reports filling rx about 10 days ago but PDMP reports last refill 08/23/2022 for 30 tablets. She does not suspect the dog has taken any medication but she can not find the bottle. She reports being out of duloxetine as she thought it was on automatic refills but wasn't. She has requested a refill and waiting for pharmacy to get it ready. She does note being more forgetful over the past few weeks. She was diagnosed with a compression fracture 05/2022 and on oxycodone short term for pain management. Recently started on Fosamax for osteoporosis. S/P lumbar fusion 05/2021. She has not been sleeping as well. PCP recommended melatonin that does help. She was seen in follow up 09/27/2022 and started on doxepin. Unsure that she has filled this med. Back pain has resolved. She has had ringing in both ears. Recently seen by audiology and diagnosed with hearing loss. She continues Plavix and rosuvastatin. Last LDL 61 06/2022.  04/05/2022 ALL: [name] [name] returns for follow up for memory deficits. She continues methyphenidate 10mg  daily (rx for BID) and duloxetine 30mg  daily. She feels that she is doing fairly well. No significant change in memory. Mood seems to be good. She reports having a fainting spell last week. PCP saw her and reported blood work stable. She started iron transfusions for anemia in 02/2022. She passed out shortly after transfusion  while shopping at [place]. She reports drinking about 32 ounces of water daily. BP is up and down.   10/01/2021 ALL: [name] [name] returns for follow up for memory difficulty. She was last seen by Dr Epimenio Foot 03/2021. She reported worsening memory and irritability. She was advised to continue methyphenidate 10mg  up to twice daily and escitalopram was switched to duloxetine due to worsening mood in setting of low back pain.   Since, she feels that she is doing better. She is s/p lumbar spinal fusion. She feels pain is improving. She is walking without her cane. No falls. She continues to be cautious. She feels her balance is off at times. She feels that she has had to slow down a little which has bothered her. She continues methylphenidate 10mg  daily. She does not usually take second dose. She feels memory is stable. She is not sure if she started duloxetine. She does report taking escitalopram but unsure of dose.   She continues Plavix and rosuvastatin for stroke preventions. She is followed regularly by PCP. BP is usually well managed. She reports pain in tongue for the past few weeks and feels this is why it is elevated.   07/09/2020 ALL:  [name] [name] is a 72 y.o. female here today for follow up for memory difficulties. 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 No concerns for AZD at that time. She continues methylphenidate 10mg  daily and escitalopram 5mg  daily. She feels that she is doing ok. She is no longer working. She is considering stopping  methylphenidate. She is having more trouble with acid reflux. Medications have been changed due to concerns of long term PPI and Plavix use. She was started on Pepcid recently and has follow up with GI next month. No stroke or TIA symptoms.    HISTORY (copied from Dr Bonnita Hollow previous note)  [name] [name] is a 72 y.o. woman who reporting memory decline.    Update 01/02/2020: She feels memory is  doing about the same.   She gets some benefit from methylphenidate 10 mg daily.  She rarely a second pill.   She tries to keep her mind active with crafts and Sudoku and other games.   She was working - Warehouse manager.    She feels mood is doing well.   She occasionally gets irritable.    She takes Lexapro 5 mg daily.  Her husband has irritability and anger issues.    She denies any TIA or stroke symptoms.   She is on Plavix and tolerates it well.  MRI of the brain shows confluent white matter changes consistent with severe chronic microvascular ischemic change.   She reports that her gait is stable,   No numbness or weakness.  Bladder is doing well.   She had Covid summer 2020 and was hospitalized x 5 days in July/Aug.  She did not need ventilation but due to COPD and pneumonia needed to be observed.   Vascular risks:   H/o smoking.  She does not have HTN or DM.    REVIEW OF SYSTEMS: Out of a complete 14 system review of symptoms, the patient complains only of the following symptoms,memory loss, inattention, insomnia and all other reviewed systems are negative.   ALLERGIES: Allergies  Allergen Reactions   Aspirin Shortness Of Breath     HOME MEDICATIONS: Outpatient Medications Prior to Visit  Medication Sig Dispense Refill   albuterol (VENTOLIN HFA) 108 (90 Base) MCG/ACT inhaler Inhale 2 puffs into the lungs every 6 (six) hours as needed for wheezing or shortness of breath. 8 g 2   alendronate (FOSAMAX) 70 MG tablet Take by mouth.     CALCIUM-VITAMIN D PO Take 1 Dose by mouth daily. 800U VIt D     clopidogrel (PLAVIX) 75 MG tablet TAKE 1 TABLET DAILY 90 tablet 3   DULoxetine (CYMBALTA) 30 MG capsule TAKE 1 CAPSULE BY MOUTH EVERY DAY 90 capsule 1   fluticasone-salmeterol (ADVAIR) 250-50 MCG/ACT AEPB Inhale 1 puff into the lungs in the morning and at bedtime. 1 each 5   losartan (COZAAR) 25 MG tablet Take 25 mg by mouth daily.     methylphenidate (RITALIN) 10 MG tablet Take 1 tablet (10 mg  total) by mouth daily. 30 tablet 0   pantoprazole (PROTONIX) 40 MG tablet Take 1 tablet (40 mg total) by mouth 2 (two) times daily. 180 tablet 2   predniSONE (DELTASONE) 5 MG tablet Alternate 2 tabs with 1 tab every other day (Patient taking differently: Take 5 mg by mouth daily with breakfast. Alternate 2 tabs with 1 tab every other day) 140 tablet 3   promethazine (PHENERGAN) 25 MG suppository PLACE 1 SUPPOSITORY (25 MG TOTAL) RECTALLY EVERY 12 (TWELVE) HOURS AS NEEDED FOR NAUSEA OR VOMITING. 6 suppository 0   rosuvastatin (CRESTOR) 10 MG tablet Take 10 mg by mouth daily.     No facility-administered medications prior to visit.     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   Unspecified sinusitis (chronic)    Vision abnormalities      PAST SURGICAL HISTORY: Past Surgical History:  Procedure Laterality Date   BUNIONECTOMY     left foot   CHOLECYSTECTOMY  05/05/2012   Procedure: LAPAROSCOPIC CHOLECYSTECTOMY WITH INTRAOPERATIVE CHOLANGIOGRAM;  Surgeon: Atilano Ina, MD,FACS;  Location: [facility] OR;  Service: General;  Laterality: N/A;   COLONOSCOPY     COLONOSCOPY W/ BIOPSIES AND POLYPECTOMY     NASAL POLYP SURGERY     x 3    POLYPECTOMY     THUMB FUSION  10/13   rt hand  pinned but pin removed   TONSILLECTOMY       FAMILY HISTORY: Family History  Problem Relation Age of Onset   Colon cancer Maternal Grandfather        unsure age of onset    Cancer Maternal Grandfather        colon   Stroke  Maternal Aunt    Heart disease Father        Vague history   Prostate cancer Brother    Ataxia Daughter        died at age 77.5     SOCIAL HISTORY: Social History   Socioeconomic History   Marital status: Married    Spouse name: Not on file   Number of children: 2   Years of education: Not on file   Highest education level: Not on file  Occupational History   Occupation: Art gallery manager    Employer: [employer]  Tobacco Use   Smoking status: Former    Current packs/day: 0.00    Average packs/day: 0.5 packs/day for 20.0 years (10.0 ttl pk-yrs)    Types: Cigarettes    Start date: 06/14/1968    Quit date: 06/14/1988    Years since quitting: 34.9   Smokeless tobacco: Never  Vaping Use   Vaping status: Never Used  Substance and Sexual Activity   Alcohol use: No   Drug use: No   Sexual activity: Yes    Birth control/protection: Post-menopausal  Other Topics Concern   Not on file  Social History Narrative   Lives with husband.     Social Determinants of Health   Financial Resource Strain: Low Risk  (08/25/2022)   Received [facility], [facility]   Overall Financial Resource Strain (CARDIA)    Difficulty of Paying Living Expenses: Not hard at all  Food Insecurity: No Food Insecurity (08/25/2022)   Received from [facility], [facility]   Hunger Vital Sign    Worried About Running Out of Food in the Last Year: Never true    Ran Out of Food in the Last Year: Never true  Transportation Needs: No Transportation Needs (08/25/2022)   Received from [facility], [facility]   PRAPARE - Transportation    Lack of Transportation (Medical): No    Lack of Transportation (Non-Medical): No  Physical Activity: Unknown (06/20/2022)   Received from [facility], [facility]   Exercise Vital Sign  Days of Exercise per Week: Not on file    Minutes of Exercise per Session: 0 min  Stress: No Stress Concern Present (06/20/2022)   Received from [facility],  [facility] - Occupational Stress Questionnaire    Feeling of Stress : Not at all  Social Connections: Somewhat Isolated (06/20/2022)   Received from [facility], [facility]   Social Network    How would you rate your social network (family, work, friends)?: Restricted participation with some degree of social isolation  Intimate Partner Violence: Not At Risk (06/20/2022)   Received from [facility], [facility]   HITS    Over the last 12 months how often did your partner physically hurt you?: 1    Over the last 12 months how often did your partner insult you or talk down to you?: 3    Over the last 12 months how often did your partner threaten you with physical harm?: 1    Over the last 12 months how often did your partner scream or curse at you?: 3      PHYSICAL EXAM  There were no vitals filed for this visit.     There is no height or weight on file to calculate BMI.   Generalized: Well developed, in no acute distress  Cardiology: normal rate and rhythm, no murmur auscultated  Respiratory: clear to auscultation bilaterally    Neurological examination  Mentation: Alert oriented to time, place, history taking. Follows all commands speech and language fluent Cranial nerve II-XII: Pupils were equal round reactive to light. Extraocular movements were full, visual field were full on confrontational test. Facial sensation and strength were normal. Head turning and shoulder shrug  were normal and symmetric. Motor: The motor testing reveals 5 over 5 strength of all 4 extremities. Good symmetric motor tone is noted throughout.  Gait and station: Gait is stable without assistive device    DIAGNOSTIC DATA (LABS, IMAGING, TESTING) - I reviewed patient records, labs, notes, testing and imaging myself where available.  Lab Results  Component Value Date   WBC 6.8 01/02/2020   HGB 12.5 01/02/2020   HCT 37.8 01/02/2020   MCV 82  01/02/2020   PLT 332 01/02/2020      Component Value Date/Time   NA 141 01/02/2020 1448   K 4.5 01/02/2020 1448   CL 104 01/02/2020 1448   CO2 24 01/02/2020 1448   GLUCOSE 109 (H) 01/02/2020 1448   GLUCOSE 95 11/02/2017 1117   BUN 21 01/02/2020 1448   CREATININE 1.17 (H) 01/02/2020 1448   CALCIUM 9.4 01/02/2020 1448   PROT 6.8 01/02/2020 1448   ALBUMIN 4.7 01/02/2020 1448   AST 17 01/02/2020 1448   ALT 9 01/02/2020 1448   ALKPHOS 78 01/02/2020 1448   BILITOT 0.3 01/02/2020 1448   GFRNONAA 48 (L) 01/02/2020 1448   GFRAA 55 (L) 01/02/2020 1448   Lab Results  Component Value Date   CHOL 228 (H) 09/01/2012   HDL 60.00 09/01/2012   LDLDIRECT 146.4 09/01/2012   TRIG 167.0 (H) 09/01/2012   CHOLHDL 4 09/01/2012   Lab Results  Component Value Date   HGBA1C 5.7 09/01/2012   Lab Results  Component Value Date   VITAMINB12 507 10/02/2014   Lab Results  Component Value Date   TSH 0.560 01/02/2020      10/05/2022    2:11 PM 04/05/2022    2:32 PM 10/01/2021    2:28 PM  MMSE -  Mini Mental State Exam  Orientation to time 4 5 4   Orientation to Place 5 5 5   Registration 3 3 3   Attention/ Calculation 4 5 3   Recall 3 3 3   Language- name 2 objects 2 2 2   Language- repeat 1 1 1   Language- follow 3 step command 3 3 3   Language- read & follow direction 1 1 1   Write a sentence 1 1 1   Copy design 1 1 1   Total score 28 30 27      ASSESSMENT AND PLAN  72 y.o. 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 here with   No diagnosis  found.  Kienna is doing well, today. Methylphenidate seems to help with attention and focus. Will continue 10mg  every day. MMSE 28/30, previously 30/30. PDMP shows appropriate refills. Last refill for 30 tablet on 04/11/2023. She will continue duloxetine 30mg  daily. She will continue healthy lifestyle habits. Continue close follow up with PCP. Follow up with me in 6 months. She verbalizes understanding and agreement with this plan.    No orders of the defined types were placed in this encounter.  No orders of the defined types were placed in this encounter.   I spent 30 minutes of face-to-face and non-face-to-face time with patient.  This included previsit chart review, lab review, study review, order entry, electronic health record documentation, patient education.   Shawnie Dapper, MSN, FNP-C 05/02/2023, 4:35 PM  [facility] [address], [address] [place], [place] [address] [phone]

## 2023-05-04 ENCOUNTER — Ambulatory Visit: Payer: Medicare (Managed Care) | Admitting: Family Medicine

## 2023-05-04 DIAGNOSIS — F418 Other specified anxiety disorders: Secondary | ICD-10-CM

## 2023-05-04 DIAGNOSIS — R413 Other amnesia: Secondary | ICD-10-CM

## 2023-05-09 ENCOUNTER — Other Ambulatory Visit: Payer: Self-pay | Admitting: Family Medicine

## 2023-05-09 ENCOUNTER — Telehealth: Payer: Self-pay | Admitting: Internal Medicine

## 2023-05-09 NOTE — Telephone Encounter (Addendum)
COPD Flare up  Cant do any strenous activity due to her getting out of breath

## 2023-05-09 NOTE — Telephone Encounter (Signed)
LVM for pt to return call

## 2023-05-09 NOTE — Telephone Encounter (Signed)
Last seen on 10/05/22 Follow up scheduled on 06/13/23 Last filled on 05/09/23 #30 tablets (30 day supply)

## 2023-05-09 NOTE — Telephone Encounter (Signed)
Spoke with the pt  She is c/o increased SOB over the past 2-3 wks  She has also had some occ chest tightness  She denies any cough, wheezing, fevers, aches  She is asking for something to be called in  She uses albuterol and advair hfa inhalers, and alternates pred 10 mg with 5 mg every other day  Please advise, thanks!  Allergies  Allergen Reactions   Aspirin Shortness Of Breath

## 2023-05-09 NOTE — Telephone Encounter (Signed)
I don't see an obvious suggestion. If she can wait till I get back, I can see her then, or she can see one of the APPS or go to her primary care.

## 2023-05-09 NOTE — Telephone Encounter (Signed)
Pt returning missed call. 

## 2023-05-10 MED ORDER — METHYLPHENIDATE HCL 10 MG PO TABS: 10.0000 mg | ORAL_TABLET | Freq: Every day | ORAL | 0 refills | Status: DC

## 2023-05-10 NOTE — Telephone Encounter (Signed)
Spoke with the pt and scheduled appt with CY 05/17/23  Nothing further needed

## 2023-05-10 NOTE — Telephone Encounter (Signed)
Dr. Epimenio Foot please deny it won't allow me since its controlled substance

## 2023-05-15 NOTE — Progress Notes (Signed)
Subjective:    Patient ID: [name] [name], female    DOB: [birth year], 72 y.o.   MRN: [id number]  HPI  female former smoker followed for asthma/COPD, allergic rhinitis/history nasal polyps/aspirin allergy, recurrent sinusitis, complicated by GERD, Post-Covid ILD,  Office Spirometry 06/01/12- moderate obstructive airways disease. FVC 3.15/85%, FEV1 1.87/65%, FEV1/FVC 0.59% FEF 25-75% 0.92/36% PFT 02/01/08- we compared her old PFT-mild obstructive airways disease with minimal response to bronchodilator. FVC 4.14/107%, FEV1 2.83/98%, FEV1/FVC 0.68, FEF 25-75% 1.72/56%. Scores have declined. PFT 07/20/2012-moderate obstructive airways disease with response to bronchodilator, normal lung volumes, diffusion mildly reduced. FVC 3.87/107%, FEV1 2.37/88%, FEV1/FVC 0.61, FEF 25-75% 1.12/39%. TLC 104%, DLCO 71%. 07/20/2012-98%, 97%, 98%, 444 m. Noted hip pain. No oxygen limitation. Echocardiogram 08/03/2012-EF 55-60%, gr 1 diastolic dysfunction. No pulmonary hypertension or wall motion abnormality FeNO-12/18/15- 13 PFT 08/03/2018-FVC 3.25/87%, FEV1 2.29/80%, ratio 0.70, FEF 25-75% 1.39/59%, NSC with dilator, TLC 98%, DLCO 88% Covid pneumonia 2020- CTchest HR- 05/15/2019-  1. 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 PFT 02/06/20- Diffusion mildly reduced  ONOX 05/15/21-Time 88% or less 4.5 minutes --------------------------------------------------------------------------------------------------------------   06/24/22- 72 year old female former smoker (10 pkyrs) followed for asthma/COPD, ILD/NSIP, allergic rhinitis/history nasal polyps/aspirin allergy, recurrent sinusitis, complicated by GERD, Covid pneumonia 2020, Low Back Pain, Osteoporosis, CAD,  -Anoro, prednisone 10 mg daily, ProAir hfa,  ONOX 05/15/21-Time 88% or less 4.5 minutes Watching post-covid ILD   Covid vax-3 Moderna  Flu vax-had ED 05/21/22- multiple bilateral rib  fxs and midback compression fx recent. Rx'd by ortho with opioids causing constipation addressed by her primary care. Broke ribs moving furniture to put up Christmas tree- denies falls.  Her breathing feels stable.  Albuterol and Anoro are managed by her primary care provider now.  We talked about reducing her maintenance prednisone. CT chest [facility] 05/21/22- CONCLUSION: 1.  No acute findings in the thorax. No acute rib fractures. Multiple bilateral chronic or late subacute fractures. Callus in a left fourth rib fracture may be more subacute. 2.  Improved pattern of organizing pneumonia compared to 2021. Residual interstitial and groundglass opacities likely fibrotic. 3.  Refer to thoracic spine CT for additional findings. Exam End: 05/21/22 00:45   Specimen Collected: 05/21/22 01:51 Last Resulted: 05/21/22 08:42  Received From: [facility]  Result Received: 05/28/22 08:32   HRCT 05/27/21- 2022 IMPRESSION: 1. 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 Findings are suggestive of an alternative diagnosis (not UIP) per consensus guidelines: Diagnosis of Idiopathic Pulmonary Fibrosis: An Official ATS/ERS/JRS/ALAT Clinical Practice Guideline. Am Rosezetta Schlatter Crit Care Med Vol 198, Iss 5, [phone], Feb 12 2017. 2. Aortic atherosclerosis (ICD10-I70.0). Coronary artery calcification.  05/17/23- 72 year old female former smoker (10 pkyrs) followed for Asthma/COPD, Post Covid ILD/NSIP, allergic rhinitis/history nasal polyps/aspirin allergy, recurrent sinusitis, complicated by GERD, Covid pneumonia 2020, Low Back Pain, Osteoporosis, CAD,  -Anoro/ Advair 250, prednisone 10 mg/ alt 5  every other day, ProAir hfa, ritalin 10, Osteoporosis, CAD,  -Anoro, prednisone 10 alt 5 mg alternates daily, ProAir hfa,  CTa chest PE 05/06/23 [facility]-- Neg for PE. Pos for small airway disease,  ASCVD. -----More SOB with exertion.  Increased use of albuterol Arrival O2 sat 99%. Thinks DOE may be worse- inhalers don't seem as helpful. No abrupt change. Little wheeze or cough and no chest pain.  Hasn't been able to manage with prednisone less than 10 mg alt with 5  mg every other day.  Consider Biologic?? Lost sense of smell after 3 nasal polypectomies.  ROS-see HPI   + = positive Constitutional:    weight loss, night sweats, fevers, chills, fatigue, lassitude. HEENT:    headaches, difficulty swallowing, tooth/dental problems, sore throat,       sneezing, itching, ear ache, +nasal congestion, post nasal drip, snoring CV:    chest pain, orthopnea, PND, swelling in lower extremities, anasarca,                                                    dizziness, palpitations Resp:  + shortness of breath with exertion or at rest.                productive cough, +  non-productive cough, coughing up of blood.              change in color of mucus.  wheezing.   Skin:    rash or lesions. GI:  No-   heartburn, indigestion, abdominal pain, nausea, vomiting,  GU:  MS:   joint pain, stiffness,  Neuro-     nothing unusual Psych:  change in mood or affect.  depression or anxiety.   memory loss.  OBJ- Physical Exam General- Alert, Oriented, Affect-appropriate, Distress- none acute Skin- rash-none, lesions- none, excoriation- none Lymphadenopathy- none Head- atraumatic            Eyes- Gross vision intact, PERRLA, conjunctivae and secretions clear            Ears- Hearing, canals-normal            Nose- not- stuffy, no-Septal dev, mucus, polyps, erosion, perforation             Throat- Mallampati III , mucosa clear/not dry , drainage- none, tonsils- atrophic , + dentures, Neck- flexible , trachea midline, no stridor , thyroid nl, carotid no bruit Chest - symmetrical excursion , unlabored           Heart/CV- RRR , no murmur , no gallop  , no rub, nl s1 s2                           - JVD- none ,  edema- none, stasis changes- none, varices- none           Lung- clear/ diminished wheeze- none, cough-none,  dullness-none, rub- none           Chest wall-  Abd-  Br/ Gen/ Rectal- Not done, not indicated Extrem- cyanosis- none, clubbing, none, atrophy- none, strength- nl Neuro- grossly intact to observation

## 2023-05-17 ENCOUNTER — Ambulatory Visit: Payer: Medicare (Managed Care) | Admitting: Internal Medicine

## 2023-05-17 ENCOUNTER — Encounter: Payer: Self-pay | Admitting: Internal Medicine

## 2023-05-17 VITALS — BP 138/80 | HR 97 | Temp 98.0°F | Ht 69.0 in | Wt 158.4 lb

## 2023-05-17 DIAGNOSIS — J849 Interstitial pulmonary disease, unspecified: Secondary | ICD-10-CM | POA: Diagnosis not present

## 2023-05-17 DIAGNOSIS — J449 Chronic obstructive pulmonary disease, unspecified: Secondary | ICD-10-CM

## 2023-05-17 MED ORDER — BREZTRI AEROSPHERE 160-9-4.8 MCG/ACT IN AERO: 2.0000 | INHALATION_SPRAY | Freq: Two times a day (BID) | RESPIRATORY_TRACT | Status: DC

## 2023-05-17 NOTE — Patient Instructions (Signed)
Order- sample x 4  Breztri inhaler    inhale 2 puffs, then rinse your mouth, twice daily Try this instead of the purple Advair disk You can still use your albuterol if needed.  Order- referral to Pulmonary Rehabilitation at [facility]    dx COPD mixed type

## 2023-06-12 NOTE — Progress Notes (Deleted)
No chief complaint on file.   HISTORY OF PRESENT ILLNESS:  06/12/23 ALL: [name] [name] returns for follow up. She was last seen 09/2022 and reported more difficulty with memory after being out of methylphenidate. Since,   She continues duloxetine 30mg  daily. Mood?  10/05/2022 ALL: [name] [name] returns for follow up. She has continued methylphenidate 10mg  and duloxetine 30mg  daily until recently (thinks the past month). She reports getting a puppy who she thinks has gotten her bottle of methylphenidate and moved it. She reports filling rx about 10 days ago but PDMP reports last refill 08/23/2022 for 30 tablets. She does not suspect the dog has taken any medication but she can not find the bottle. She reports being out of duloxetine as she thought it was on automatic refills but wasn't. She has requested a refill and waiting for pharmacy to get it ready. She does note being more forgetful over the past few weeks. She was diagnosed with a compression fracture 05/2022 and on oxycodone short term for pain management. Recently started on Fosamax for osteoporosis. S/P lumbar fusion 05/2021. She has not been sleeping as well. PCP recommended melatonin that does help. She was seen in follow up 09/27/2022 and started on doxepin. Unsure that she has filled this med. Back pain has resolved. She has had ringing in both ears. Recently seen by audiology and diagnosed with hearing loss. She continues Plavix and rosuvastatin. Last LDL 61 06/2022.  04/05/2022 ALL: [name] [name] returns for follow up for memory deficits. She continues methyphenidate 10mg  daily (rx for BID) and duloxetine 30mg  daily. She feels that she is doing fairly well. No significant change in memory. Mood seems to be good. She reports having a fainting spell last week. PCP saw her and reported blood work stable. She started iron transfusions for anemia in 02/2022. She passed out shortly after transfusion while shopping at [place]. She reports drinking about 32 ounces of  water daily. BP is up and down.   10/01/2021 ALL: [name] [name] returns for follow up for memory difficulty. She was last seen by Dr Epimenio Foot 03/2021. She reported worsening memory and irritability. She was advised to continue methyphenidate 10mg  up to twice daily and escitalopram was switched to duloxetine due to worsening mood in setting of low back pain.   Since, she feels that she is doing better. She is s/p lumbar spinal fusion. She feels pain is improving. She is walking without her cane. No falls. She continues to be cautious. She feels her balance is off at times. She feels that she has had to slow down a little which has bothered her. She continues methylphenidate 10mg  daily. She does not usually take second dose. She feels memory is stable. She is not sure if she started duloxetine. She does report taking escitalopram but unsure of dose.   She continues Plavix and rosuvastatin for stroke preventions. She is followed regularly by PCP. BP is usually well managed. She reports pain in tongue for the past few weeks and feels this is why it is elevated.   07/09/2020 ALL:  [name] [name] is a 72 y.o. female here today for follow up for memory difficulties. 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 No concerns for AZD at that time. She continues methylphenidate 10mg  daily and escitalopram 5mg  daily. She feels that she is doing ok. She is no longer working. She is considering stopping methylphenidate. She is having more trouble with acid reflux. Medications have been changed due  to concerns of long term PPI and Plavix use. She was started on Pepcid recently and has follow up with GI next month. No stroke or TIA symptoms.    HISTORY (copied from Dr Bonnita Hollow previous note)  [name] [name] is a 72 y.o. woman who reporting memory decline.    Update 01/02/2020: She feels memory is doing about the same.   She gets some benefit from methylphenidate 10  mg daily.  She rarely a second pill.   She tries to keep her mind active with crafts and Sudoku and other games.   She was working - Warehouse manager.    She feels mood is doing well.   She occasionally gets irritable.    She takes Lexapro 5 mg daily.  Her husband has irritability and anger issues.    She denies any TIA or stroke symptoms.   She is on Plavix and tolerates it well.  MRI of the brain shows confluent white matter changes consistent with severe chronic microvascular ischemic change.   She reports that her gait is stable,   No numbness or weakness.  Bladder is doing well.   She had Covid summer 2020 and was hospitalized x 5 days in July/Aug.  She did not need ventilation but due to COPD and pneumonia needed to be observed.   Vascular risks:   H/o smoking.  She does not have HTN or DM.    REVIEW OF SYSTEMS: Out of a complete 14 system review of symptoms, the patient complains only of the following symptoms,memory loss, inattention, insomnia and all other reviewed systems are negative.   ALLERGIES: Allergies  Allergen Reactions   Aspirin Shortness Of Breath     HOME MEDICATIONS: Outpatient Medications Prior to Visit  Medication Sig Dispense Refill   Abaloparatide (TYMLOS) 3120 MCG/1.56ML SOPN Inject into the skin daily.     albuterol (VENTOLIN HFA) 108 (90 Base) MCG/ACT inhaler Inhale 2 puffs into the lungs every 6 (six) hours as needed for wheezing or shortness of breath. 8 g 2   alendronate (FOSAMAX) 70 MG tablet Take by mouth.     Budeson-Glycopyrrol-Formoterol (BREZTRI AEROSPHERE) 160-9-4.8 MCG/ACT AERO Inhale 2 puffs into the lungs in the morning and at bedtime.     CALCIUM-VITAMIN D PO Take 1 Dose by mouth daily. 800U VIt D     clopidogrel (PLAVIX) 75 MG tablet TAKE 1 TABLET DAILY 90 tablet 3   DULoxetine (CYMBALTA) 30 MG capsule TAKE 1 CAPSULE BY MOUTH EVERY DAY 90 capsule 1   famotidine (PEPCID) 20 MG tablet Take 20 mg by mouth daily.     fluticasone-salmeterol (ADVAIR)  250-50 MCG/ACT AEPB Inhale 1 puff into the lungs in the morning and at bedtime. 1 each 5   losartan (COZAAR) 25 MG tablet Take 25 mg by mouth daily.     methylphenidate (RITALIN) 10 MG tablet Take 1 tablet (10 mg total) by mouth daily. 30 tablet 0   pantoprazole (PROTONIX) 40 MG tablet Take 1 tablet (40 mg total) by mouth 2 (two) times daily. (Patient not taking: Reported on 05/17/2023) 180 tablet 2   predniSONE (DELTASONE) 5 MG tablet Alternate 2 tabs with 1 tab every other day (Patient taking differently: Take 5 mg by mouth daily with breakfast. Alternate 2 tabs with 1 tab every other day) 140 tablet 3   promethazine (PHENERGAN) 25 MG suppository PLACE 1 SUPPOSITORY (25 MG TOTAL) RECTALLY EVERY 12 (TWELVE) HOURS AS NEEDED FOR NAUSEA OR VOMITING. 6 suppository 0   rosuvastatin (CRESTOR) 10 MG  tablet Take 10 mg by mouth daily.     No facility-administered medications prior to visit.     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   Unspecified sinusitis (chronic)    Vision abnormalities      PAST SURGICAL HISTORY: Past Surgical History:  Procedure Laterality Date   BUNIONECTOMY     left foot   CHOLECYSTECTOMY  05/05/2012   Procedure: LAPAROSCOPIC CHOLECYSTECTOMY WITH INTRAOPERATIVE CHOLANGIOGRAM;  Surgeon: Atilano Ina, MD,FACS;  Location: [facility] OR;  Service: General;  Laterality: N/A;   COLONOSCOPY     COLONOSCOPY W/ BIOPSIES AND POLYPECTOMY     NASAL POLYP SURGERY     x 3    POLYPECTOMY     THUMB FUSION   10/13   rt hand  pinned but pin removed   TONSILLECTOMY       FAMILY HISTORY: Family History  Problem Relation Age of Onset   Colon cancer Maternal Grandfather        unsure age of onset    Cancer Maternal Grandfather        colon   Stroke Maternal Aunt    Heart disease Father        Vague history   Prostate cancer Brother    Ataxia Daughter        died at age 31.5     SOCIAL HISTORY: Social History   Socioeconomic History   Marital status: Married    Spouse name: Not on file   Number of children: 2   Years of education: Not on file   Highest education level: Not on file  Occupational History   Occupation: Art gallery manager    Employer: [employer]  Tobacco Use   Smoking status: Former    Current packs/day: 0.00    Average packs/day: 0.5 packs/day for 20.0 years (10.0 ttl pk-yrs)    Types: Cigarettes    Start date: 06/14/1968    Quit date: 06/14/1988    Years since quitting: 35.0   Smokeless tobacco: Never  Vaping Use   Vaping status: Never Used  Substance and Sexual Activity   Alcohol use: No   Drug use: No   Sexual activity: Yes    Birth control/protection: Post-menopausal  Other Topics Concern   Not on file  Social History Narrative   Lives with husband.     Social Drivers of Corporate investment banker Strain: Low Risk  (08/25/2022)   Received from [facility], [facility]   Overall Financial Resource Strain (CARDIA)    Difficulty of Paying Living Expenses: Not hard at all  Food Insecurity: No Food Insecurity (08/25/2022)   Received from [facility], [facility]   Hunger Vital Sign    Worried About Running Out of Food in the Last Year: Never true    Ran Out of Food in the Last Year: Never true  Transportation Needs: No Transportation Needs (08/25/2022)   Received from [facility],  [facility]   PRAPARE - Administrator, Civil Service (Medical): No    Lack of Transportation (Non-Medical): No  Physical Activity:  Unknown (06/20/2022)   Received from [facility], [facility]   Exercise Vital Sign    Days of Exercise per Week: Not on file    Minutes of Exercise per Session: 0 min  Stress: No Stress Concern Present (06/20/2022)   Received from [facility], [facility] - Occupational Stress Questionnaire    Feeling of Stress : Not at all  Social Connections: Somewhat Isolated (06/20/2022)   Received from [facility], [facility]   Social Network    How would you rate your social network (family, work, friends)?: Restricted participation with some degree of social isolation  Intimate Partner Violence: Not At Risk (06/20/2022)   Received from [facility], [facility]   HITS    Over the last 12 months how often did your partner physically hurt you?: Never    Over the last 12 months how often did your partner insult you or talk down to you?: Sometimes    Over the last 12 months how often did your partner threaten you with physical harm?: Never    Over the last 12 months how often did your partner scream or curse at you?: Sometimes      PHYSICAL EXAM  There were no vitals filed for this visit.     There is no height or weight on file to calculate BMI.   Generalized: Well developed, in no acute distress  Cardiology: normal rate and rhythm, no murmur auscultated  Respiratory: clear to auscultation bilaterally    Neurological examination  Mentation: Alert oriented to time, place, history taking. Follows all commands speech and language fluent Cranial nerve II-XII: Pupils were equal round reactive to light. Extraocular movements were full, visual field were full on confrontational test. Facial sensation and strength were normal. Head turning and shoulder shrug  were normal and symmetric. Motor: The motor testing reveals 5 over 5 strength of all 4 extremities. Good symmetric motor tone is noted throughout.  Gait and station: Gait is stable  without assistive device    DIAGNOSTIC DATA (LABS, IMAGING, TESTING) - I reviewed patient records, labs, notes, testing and imaging myself where available.  Lab Results  Component Value Date   WBC 6.8 01/02/2020   HGB 12.5 01/02/2020   HCT 37.8 01/02/2020   MCV 82 01/02/2020   PLT 332 01/02/2020      Component Value Date/Time   NA 141 01/02/2020 1448   K 4.5 01/02/2020 1448   CL 104 01/02/2020 1448   CO2 24 01/02/2020 1448   GLUCOSE 109 (H) 01/02/2020 1448   GLUCOSE 95 11/02/2017 1117   BUN 21 01/02/2020 1448   CREATININE 1.17 (H) 01/02/2020 1448   CALCIUM 9.4 01/02/2020 1448   PROT 6.8 01/02/2020 1448   ALBUMIN 4.7 01/02/2020 1448   AST 17 01/02/2020 1448   ALT 9 01/02/2020 1448   ALKPHOS 78 01/02/2020 1448   BILITOT 0.3 01/02/2020 1448   GFRNONAA 48 (L) 01/02/2020 1448   GFRAA 55 (L) 01/02/2020 1448   Lab Results  Component Value Date   CHOL 228 (H) 09/01/2012   HDL 60.00 09/01/2012   LDLDIRECT 146.4 09/01/2012   TRIG 167.0 (H) 09/01/2012   CHOLHDL 4 09/01/2012   Lab Results  Component Value Date   HGBA1C 5.7 09/01/2012   Lab Results  Component Value Date  [id number] 507 10/02/2014   Lab Results  Component Value Date   TSH 0.560 01/02/2020      10/05/2022    2:11 PM 04/05/2022    2:32 PM 10/01/2021    2:28 PM  MMSE - Mini Mental State Exam  Orientation to time 4 5 4   Orientation to Place 5 5 5   Registration 3 3 3   Attention/ Calculation 4 5 3   Recall 3 3 3   Language- name 2 objects 2 2 2   Language- repeat 1 1 1   Language- follow 3 step command 3 3 3   Language- read & follow direction 1 1 1   Write a sentence 1 1 1   Copy design 1 1 1   Total score 28 30 27      ASSESSMENT AND PLAN  72 y.o. 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 here with   No diagnosis found.  Nakeisha is doing well, today. Methylphenidate seems to help with attention and focus. She feels memory has worsening since being off for the past few weeks. MMSE 28/30, previously 30/30. PDMP shows appropriate refills. Last refill for 30 tablet on 08/23/2022. I have discussed importance of keeping this medication in a safe place and away from children or animals. I will place a refill, today. She will continue duloxetine 30mg  daily. She will continue healthy lifestyle habits. Continue close follow up with PCP. Follow up with me in 6 months. She verbalizes understanding and agreement with this plan.    No orders of the defined types were placed in this encounter.  No orders of the defined types were placed in this encounter.   I spent 30 minutes of face-to-face and non-face-to-face time with patient.  This included previsit chart review, lab review, study review, order entry, electronic health record documentation, patient education.   Shawnie Dapper, MSN, FNP-C 06/12/2023, 3:19 PM  [facility] [address] [place], [place] [address] [phone]

## 2023-06-13 ENCOUNTER — Encounter: Payer: Self-pay | Admitting: Family Medicine

## 2023-06-13 ENCOUNTER — Ambulatory Visit: Payer: Medicare (Managed Care) | Admitting: Family Medicine

## 2023-06-13 DIAGNOSIS — R413 Other amnesia: Secondary | ICD-10-CM

## 2023-06-13 DIAGNOSIS — F418 Other specified anxiety disorders: Secondary | ICD-10-CM

## 2023-06-13 DIAGNOSIS — R41840 Attention and concentration deficit: Secondary | ICD-10-CM

## 2023-06-20 ENCOUNTER — Encounter: Payer: Self-pay | Admitting: Family Medicine

## 2023-06-25 NOTE — Progress Notes (Deleted)
 Subjective:    Patient ID: [name] [name], female    DOB: [birth year], 73 y.o.   MRN: [id number]  HPI  female former smoker followed for asthma/COPD, allergic rhinitis/history nasal polyps/aspirin allergy, recurrent sinusitis, complicated by GERD, Post-Covid ILD,  Office Spirometry 06/01/12- moderate obstructive airways disease. FVC 3.15/85%, FEV1 1.87/65%, FEV1/FVC 0.59% FEF 25-75% 0.92/36% PFT 02/01/08- we compared her old PFT-mild obstructive airways disease with minimal response to bronchodilator. FVC 4.14/107%, FEV1 2.83/98%, FEV1/FVC 0.68, FEF 25-75% 1.72/56%. Scores have declined. PFT 07/20/2012-moderate obstructive airways disease with response to bronchodilator, normal lung volumes, diffusion mildly reduced. FVC 3.87/107%, FEV1 2.37/88%, FEV1/FVC 0.61, FEF 25-75% 1.12/39%. TLC 104%, DLCO 71%. 07/20/2012-98%, 97%, 98%, 444 m. Noted hip pain. No oxygen limitation. Echocardiogram 08/03/2012-EF 55-60%, gr 1 diastolic dysfunction. No pulmonary hypertension or wall motion abnormality FeNO-12/18/15- 13 PFT 08/03/2018-FVC 3.25/87%, FEV1 2.29/80%, ratio 0.70, FEF 25-75% 1.39/59%, NSC with dilator, TLC 98%, DLCO 88% Covid pneumonia 2020- CTchest HR- 05/15/2019-  1. 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 PFT 02/06/20- Diffusion mildly reduced  ONOX 05/15/21-Time 88% or less 4.5 minutes --------------------------------------------------------------------------------------------------------------   05/17/23- 73 year old female former smoker (10 pkyrs) followed for asthma/COPD, Post Covid ILD/NSIP, allergic rhinitis/history nasal polyps/aspirin allergy, recurrent sinusitis, complicated by GERD, Covid pneumonia 2020, Low Back Pain, Osteoporosis, CAD,  -Anoro/ Advair 250, prednisone  10 mg/ alt 5  every other day, ProAir  hfa, ritalin  10, Osteoporosis, CAD,  -Anoro, prednisone  10 alt 5 mg alternates daily, ProAir  hfa,  CTa  chest PE 05/06/23 [facility]-- Neg for PE. Pos for small airway disease, ASCVD. -----More SOB with exertion.  Increased use of albuterol  Arrival O2 sat 99%.  06/27/23- 73 year old female former smoker (10 pkyrs) followed for asthma/COPD, Post Covid ILD/NSIP, allergic rhinitis/history nasal polyps/aspirin allergy, recurrent sinusitis, complicated by GERD, Covid pneumonia 2020, Low Back Pain, Osteoporosis, CAD,  -Anoro/ Advair 250, prednisone  10 mg/ alt 5  every other day, ProAir  hfa, ritalin  10, Osteoporosis, CAD,  -Anoro, prednisone  10 alt 5 mg alternates daily, ProAir  hfa,  At last visit given sample Breztri  to try instead of Advair/ Anoro    ROS-see HPI   + = positive Constitutional:    weight loss, night sweats, fevers, chills, fatigue, lassitude. HEENT:    headaches, difficulty swallowing, tooth/dental problems, sore throat,       sneezing, itching, ear ache, +nasal congestion, post nasal drip, snoring CV:    chest pain, orthopnea, PND, swelling in lower extremities, anasarca,                                                    dizziness, palpitations Resp:  + shortness of breath with exertion or at rest.                productive cough, +  non-productive cough, coughing up of blood.              change in color of mucus.  wheezing.   Skin:    rash or lesions. GI:  No-   heartburn, indigestion, abdominal pain, nausea, vomiting,  GU:  MS:   joint pain, stiffness,  Neuro-     nothing unusual Psych:  change in mood or affect.  depression or anxiety.   memory loss.  OBJ- Physical Exam General- Alert, Oriented, Affect-appropriate, Distress- none acute Skin- rash-none, lesions- none,  excoriation- none Lymphadenopathy- none Head- atraumatic            Eyes- Gross vision intact, PERRLA, conjunctivae and secretions clear            Ears- Hearing, canals-normal            Nose- not- stuffy, no-Septal dev, mucus, polyps, erosion, perforation             Throat- Mallampati III , mucosa  clear/not dry , drainage- none, tonsils- atrophic , + dentures, Neck- flexible , trachea midline, no stridor , thyroid nl, carotid no bruit Chest - symmetrical excursion , unlabored           Heart/CV- RRR , no murmur , no gallop  , no rub, nl s1 s2                           - JVD- none , edema- none, stasis changes- none, varices- none           Lung- clear to P&A, wheeze- none, cough +,  dullness-none, rub- none           Chest wall-  Abd-  Br/ Gen/ Rectal- Not done, not indicated Extrem- cyanosis- none, clubbing, none, atrophy- none, strength- nl Neuro- grossly intact to observation

## 2023-06-27 ENCOUNTER — Ambulatory Visit: Payer: Medicare (Managed Care) | Admitting: Internal Medicine

## 2023-06-27 ENCOUNTER — Encounter: Payer: Self-pay | Admitting: Internal Medicine

## 2023-06-29 ENCOUNTER — Encounter: Payer: Self-pay | Admitting: Internal Medicine

## 2023-07-01 ENCOUNTER — Telehealth: Payer: Self-pay | Admitting: Pulmonary Disease

## 2023-07-01 DIAGNOSIS — J449 Chronic obstructive pulmonary disease, unspecified: Secondary | ICD-10-CM

## 2023-07-01 MED ORDER — BREZTRI AEROSPHERE 160-9-4.8 MCG/ACT IN AERO: 2.0000 | INHALATION_SPRAY | Freq: Two times a day (BID) | RESPIRATORY_TRACT | 5 refills | Status: DC

## 2023-07-01 NOTE — Telephone Encounter (Signed)
After Hours Clinic Call  Patient requesting prescription for Breztri inhaler after running out of sample.  Script sent to her pharmacy.  Melody Comas, MD [facility] Office: [phone]   See Amion for personal pager PCCM on call pager [phone] until 7pm. Please call Elink 7p-7a. [phone]

## 2023-07-02 ENCOUNTER — Encounter: Payer: Self-pay | Admitting: Internal Medicine

## 2023-07-02 NOTE — Assessment & Plan Note (Signed)
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

## 2023-07-02 NOTE — Assessment & Plan Note (Signed)
We will refer for Pulmonary Rehab at [facility]- lives in [place]. Consider Biologic- hax Asthma/ COPD and nasal polyps Samples Breztri with discussion

## 2023-07-05 ENCOUNTER — Telehealth: Payer: Self-pay | Admitting: Internal Medicine

## 2023-07-05 NOTE — Telephone Encounter (Signed)
Can she check with pharmacy or insurance to see if Trelegy is covered, instead of Breztri?

## 2023-07-05 NOTE — Telephone Encounter (Signed)
PT calling because [name] [name] is not covered by ins. Used to be on Breo. Wonders if she should go back on that or another alternative to the [place]. Pls call PT to advise. Her # is [phone]  States [name] [name] is her Dr. Francine Graven was just the DOD.)

## 2023-07-06 NOTE — Telephone Encounter (Signed)
Trelegy Is on formulary but with Quantity Limit: 60 Quantity per 30 Day that would be covered

## 2023-07-07 ENCOUNTER — Other Ambulatory Visit (HOSPITAL_COMMUNITY): Payer: Self-pay

## 2023-07-07 MED ORDER — TRELEGY ELLIPTA 100-62.5-25 MCG/ACT IN AEPB: 1.0000 | INHALATION_SPRAY | Freq: Every day | RESPIRATORY_TRACT | 3 refills | Status: DC

## 2023-07-07 NOTE — Telephone Encounter (Signed)
I called and spoke with the pt and notified of response from Dr Maple Hudson  She verbalized understanding  Nothing further needed

## 2023-07-07 NOTE — Telephone Encounter (Signed)
Please send Trelegy 100, # 60 (1 unit)    inhale 1 puff, then rinse mouth, once daily

## 2023-07-07 NOTE — Telephone Encounter (Signed)
Refills were sent in 1/17 to confirmed pharmacy. Nfn

## 2023-07-07 NOTE — Telephone Encounter (Signed)
Test claim for Trelegy shows co-pay of $47.00

## 2023-07-11 ENCOUNTER — Other Ambulatory Visit: Payer: Self-pay | Admitting: Neurology

## 2023-07-11 ENCOUNTER — Other Ambulatory Visit: Payer: Self-pay

## 2023-07-12 ENCOUNTER — Other Ambulatory Visit: Payer: Self-pay | Admitting: Neurology

## 2023-07-14 ENCOUNTER — Telehealth: Payer: Self-pay | Admitting: Family Medicine

## 2023-07-14 MED ORDER — METHYLPHENIDATE HCL 10 MG PO TABS: 10.0000 mg | ORAL_TABLET | Freq: Every day | ORAL | 0 refills | Status: DC

## 2023-07-14 NOTE — Telephone Encounter (Signed)
Last seen 09/2022 and scheduled f/u for 07/18/23. Last refilled 06/06/23 #30.

## 2023-07-14 NOTE — Telephone Encounter (Signed)
Last seen 10/05/22 and has no f/u  I called pt. She is out of med. Scheduled f/u for 07/18/23 at 3:30pm to see Amy Lomax,NP for f/u. Aware I will send refill request to Amy to send in but aware she needs to f/u for ongoing refills.

## 2023-07-14 NOTE — Telephone Encounter (Signed)
Pt calling checking on refill request for methylphenidate (RITALIN) 10 MG tablet sent through MyChart on 07/11/23

## 2023-07-15 NOTE — Progress Notes (Deleted)
 No chief complaint on file.   HISTORY OF PRESENT ILLNESS:  07/15/23 ALL: [name] [name] returns for follow up. She continues methylphenidate 10mg  and duloxetine 30mg  daily???  10/05/2022 ALL:  [name] [name] returns for follow up. She has continued methylphenidate 10mg  and duloxetine 30mg  daily until recently (thinks the past month). She reports getting a puppy who she thinks has gotten her bottle of methylphenidate and moved it. She reports filling rx about 10 days ago but PDMP reports last refill 08/23/2022 for 30 tablets. She does not suspect the dog has taken any medication but she can not find the bottle. She reports being out of duloxetine as she thought it was on automatic refills but wasn't. She has requested a refill and waiting for pharmacy to get it ready. She does note being more forgetful over the past few weeks. She was diagnosed with a compression fracture 05/2022 and on oxycodone short term for pain management. Recently started on Fosamax for osteoporosis. S/P lumbar fusion 05/2021. She has not been sleeping as well. PCP recommended melatonin that does help. She was seen in follow up 09/27/2022 and started on doxepin. Unsure that she has filled this med. Back pain has resolved. She has had ringing in both ears. Recently seen by audiology and diagnosed with hearing loss. She continues Plavix and rosuvastatin. Last LDL 61 06/2022.  04/05/2022 ALL: [name] [name] returns for follow up for memory deficits. She continues methyphenidate 10mg  daily (rx for BID) and duloxetine 30mg  daily. She feels that she is doing fairly well. No significant change in memory. Mood seems to be good. She reports having a fainting spell last week. PCP saw her and reported blood work stable. She started iron transfusions for anemia in 02/2022. She passed out shortly after transfusion while shopping at [place]. She reports drinking about 32 ounces of water daily. BP is up and down.   10/01/2021 ALL: [name] [name] returns for follow up for memory  difficulty. She was last seen by Dr Epimenio Foot 03/2021. She reported worsening memory and irritability. She was advised to continue methyphenidate 10mg  up to twice daily and escitalopram was switched to duloxetine due to worsening mood in setting of low back pain.   Since, she feels that she is doing better. She is s/p lumbar spinal fusion. She feels pain is improving. She is walking without her cane. No falls. She continues to be cautious. She feels her balance is off at times. She feels that she has had to slow down a little which has bothered her. She continues methylphenidate 10mg  daily. She does not usually take second dose. She feels memory is stable. She is not sure if she started duloxetine. She does report taking escitalopram but unsure of dose.   She continues Plavix and rosuvastatin for stroke preventions. She is followed regularly by PCP. BP is usually well managed. She reports pain in tongue for the past few weeks and feels this is why it is elevated.   07/09/2020 ALL:  [name] [name] is a 73 y.o. female here today for follow up for memory difficulties. 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 No concerns for AZD at that time. She continues methylphenidate 10mg  daily and escitalopram 5mg  daily. She feels that she is doing ok. She is no longer working. She is considering stopping methylphenidate. She is having more trouble with acid reflux. Medications have been changed due to concerns of long term PPI and Plavix use. She was started on Pepcid recently and  has follow up with GI next month. No stroke or TIA symptoms.   HISTORY (copied from Dr Bonnita Hollow previous note)  [name] [name] is a 73 y.o. woman who reporting memory decline.    Update 01/02/2020: She feels memory is doing about the same.   She gets some benefit from methylphenidate 10 mg daily.  She rarely a second pill.   She tries to keep her mind active with crafts and  Sudoku and other games.   She was working - Warehouse manager.    She feels mood is doing well.   She occasionally gets irritable.    She takes Lexapro 5 mg daily.  Her husband has irritability and anger issues.    She denies any TIA or stroke symptoms.   She is on Plavix and tolerates it well.  MRI of the brain shows confluent white matter changes consistent with severe chronic microvascular ischemic change.   She reports that her gait is stable,   No numbness or weakness.  Bladder is doing well.   She had Covid summer 2020 and was hospitalized x 5 days in July/Aug.  She did not need ventilation but due to COPD and pneumonia needed to be observed.   Vascular risks:   H/o smoking.  She does not have HTN or DM.    REVIEW OF SYSTEMS: Out of a complete 14 system review of symptoms, the patient complains only of the following symptoms,memory loss, inattention, insomnia and all other reviewed systems are negative.   ALLERGIES: Allergies  Allergen Reactions   Aspirin Shortness Of Breath     HOME MEDICATIONS: Outpatient Medications Prior to Visit  Medication Sig Dispense Refill   Abaloparatide (TYMLOS) 3120 MCG/1.56ML SOPN Inject into the skin daily.     albuterol (VENTOLIN HFA) 108 (90 Base) MCG/ACT inhaler Inhale 2 puffs into the lungs every 6 (six) hours as needed for wheezing or shortness of breath. 8 g 2   alendronate (FOSAMAX) 70 MG tablet Take by mouth.     Budeson-Glycopyrrol-Formoterol (BREZTRI AEROSPHERE) 160-9-4.8 MCG/ACT AERO Inhale 2 puffs into the lungs in the morning and at bedtime. 10.7 g 5   CALCIUM-VITAMIN D PO Take 1 Dose by mouth daily. 800U VIt D     clopidogrel (PLAVIX) 75 MG tablet TAKE 1 TABLET DAILY 90 tablet 3   DULoxetine (CYMBALTA) 30 MG capsule TAKE 1 CAPSULE BY MOUTH EVERY DAY 90 capsule 1   famotidine (PEPCID) 20 MG tablet Take 20 mg by mouth daily.     Fluticasone-Umeclidin-Vilant (TRELEGY ELLIPTA) 100-62.5-25 MCG/ACT AEPB Inhale 1 puff into the lungs daily. 180 each 3    losartan (COZAAR) 25 MG tablet Take 25 mg by mouth daily.     methylphenidate (RITALIN) 10 MG tablet Take 1 tablet (10 mg total) by mouth daily. 30 tablet 0   pantoprazole (PROTONIX) 40 MG tablet Take 1 tablet (40 mg total) by mouth 2 (two) times daily. (Patient not taking: Reported on 05/17/2023) 180 tablet 2   predniSONE (DELTASONE) 5 MG tablet Alternate 2 tabs with 1 tab every other day (Patient taking differently: Take 5 mg by mouth daily with breakfast. Alternate 2 tabs with 1 tab every other day) 140 tablet 3   promethazine (PHENERGAN) 25 MG suppository PLACE 1 SUPPOSITORY (25 MG TOTAL) RECTALLY EVERY 12 (TWELVE) HOURS AS NEEDED FOR NAUSEA OR VOMITING. 6 suppository 0   rosuvastatin (CRESTOR) 10 MG tablet Take 10 mg by mouth daily.     No facility-administered medications prior to visit.  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   Unspecified sinusitis (chronic)    Vision abnormalities      PAST SURGICAL HISTORY: Past Surgical History:  Procedure Laterality Date   BUNIONECTOMY     left foot   CHOLECYSTECTOMY  05/05/2012   Procedure: LAPAROSCOPIC CHOLECYSTECTOMY WITH INTRAOPERATIVE CHOLANGIOGRAM;  Surgeon: Atilano Ina, MD,FACS;  Location: [facility] OR;  Service: General;  Laterality: N/A;   COLONOSCOPY     COLONOSCOPY W/ BIOPSIES AND POLYPECTOMY     NASAL POLYP SURGERY     x 3    POLYPECTOMY     THUMB FUSION  10/13   rt hand  pinned but pin removed   TONSILLECTOMY       FAMILY  HISTORY: Family History  Problem Relation Age of Onset   Colon cancer Maternal Grandfather        unsure age of onset    Cancer Maternal Grandfather        colon   Stroke Maternal Aunt    Heart disease Father        Vague history   Prostate cancer Brother    Ataxia Daughter        died at age 56.5     SOCIAL HISTORY: Social History   Socioeconomic History   Marital status: Married    Spouse name: Not on file   Number of children: 2   Years of education: Not on file   Highest education level: Not on file  Occupational History   Occupation: Art gallery manager    Employer: [employer]  Tobacco Use   Smoking status: Former    Current packs/day: 0.00    Average packs/day: 0.5 packs/day for 20.0 years (10.0 ttl pk-yrs)    Types: Cigarettes    Start date: 06/14/1968    Quit date: 06/14/1988    Years since quitting: 35.1   Smokeless tobacco: Never  Vaping Use   Vaping status: Never Used  Substance and Sexual Activity   Alcohol use: No   Drug use: No   Sexual activity: Yes    Birth control/protection: Post-menopausal  Other Topics Concern   Not on file  Social History Narrative   Lives with husband.     Social Drivers of Corporate investment banker Strain: Low Risk  (06/26/2023)   [facility]   Overall Financial Resource Strain (CARDIA)    Difficulty of Paying Living Expenses: Not hard at all  Food Insecurity: Low Risk  (06/29/2023)   Received from [facility]   Hunger Vital Sign    Worried About Running Out of Food in the Last Year: Never true    Ran Out of Food in the Last Year: Never true  Transportation Needs: No Transportation Needs (06/29/2023)   Received from [facility]    In the past 12 months, has lack of reliable transportation kept you from medical appointments, meetings, work or from  getting things needed for daily living? : No  Physical Activity: Unknown (06/26/2023)   Received from [facility]   Exercise  Vital Sign    Days of Exercise per Week: 0 days    Minutes of Exercise per Session: Not on file  Stress: No Stress Concern Present (06/26/2023)   Received from [facility] - Occupational Stress Questionnaire    Feeling of Stress : Not at all  Social Connections: Patient Declined (06/26/2023)   Received from [facility]   Social Network    How would you rate your social network (family, work, friends)?: Patient declined  Intimate Partner Violence: Not At Risk (06/26/2023)   Received from [facility]   HITS    Over the last 12 months how often did your partner physically hurt you?: Never    Over the last 12 months how often did your partner insult you or talk down to you?: Never    Over the last 12 months how often did your partner threaten you with physical harm?: Never    Over the last 12 months how often did your partner scream or curse at you?: Never      PHYSICAL EXAM  There were no vitals filed for this visit.     There is no height or weight on file to calculate BMI.   Generalized: Well developed, in no acute distress  Cardiology: normal rate and rhythm, no murmur auscultated  Respiratory: clear to auscultation bilaterally    Neurological examination  Mentation: Alert oriented to time, place, history taking. Follows all commands speech and language fluent Cranial nerve II-XII: Pupils were equal round reactive to light. Extraocular movements were full, visual field were full on confrontational test. Facial sensation and strength were normal. Head turning and shoulder shrug  were normal and symmetric. Motor: The motor testing reveals 5 over 5 strength of all 4 extremities. Good symmetric motor tone is noted throughout.  Gait and station: Gait is stable without assistive device    DIAGNOSTIC DATA (LABS, IMAGING, TESTING) - I reviewed patient records, labs, notes, testing and imaging myself where available.  Lab Results   Component Value Date   WBC 6.8 01/02/2020   HGB 12.5 01/02/2020   HCT 37.8 01/02/2020   MCV 82 01/02/2020   PLT 332 01/02/2020      Component Value Date/Time   NA 141 01/02/2020 1448   K 4.5 01/02/2020 1448   CL 104 01/02/2020 1448   CO2 24 01/02/2020 1448   GLUCOSE 109 (H) 01/02/2020 1448   GLUCOSE 95 11/02/2017 1117   BUN 21 01/02/2020 1448   CREATININE 1.17 (H) 01/02/2020 1448   CALCIUM 9.4 01/02/2020 1448   PROT 6.8 01/02/2020 1448   ALBUMIN 4.7 01/02/2020 1448   AST 17 01/02/2020 1448   ALT 9 01/02/2020 1448   ALKPHOS 78 01/02/2020 1448   BILITOT 0.3 01/02/2020 1448   GFRNONAA 48 (L) 01/02/2020 1448   GFRAA 55 (L) 01/02/2020 1448   Lab Results  Component Value Date   CHOL 228 (H) 09/01/2012   HDL 60.00 09/01/2012   LDLDIRECT 146.4 09/01/2012   TRIG 167.0 (H) 09/01/2012   CHOLHDL 4 09/01/2012   Lab Results  Component Value Date   HGBA1C 5.7 09/01/2012   Lab Results  Component Value Date   VITAMINB12 507 10/02/2014   Lab Results  Component Value Date   TSH 0.560 01/02/2020      10/05/2022    2:11 PM  04/05/2022    2:32 PM 10/01/2021    2:28 PM  MMSE - Mini Mental State Exam  Orientation to time 4 5 4   Orientation to Place 5 5 5   Registration 3 3 3   Attention/ Calculation 4 5 3   Recall 3 3 3   Language- name 2 objects 2 2 2   Language- repeat 1 1 1   Language- follow 3 step command 3 3 3   Language- read & follow direction 1 1 1   Write a sentence 1 1 1   Copy design 1 1 1   Total score 28 30 27      ASSESSMENT AND PLAN  73 y.o. year old female  has a past medical history of Allergic rhinitis, cause unspecified, Allergy, Anxiety, Arthritis, COPD (chronic obstructive pulmonary disease) (HCC), Depression, Diverticulosis, Extrinsic asthma, unspecified, GERD (gastroesophageal reflux disease), Hiatal hernia, Polyp of nasal cavity, PONV (postoperative nausea and vomiting), S/P dilatation of esophageal stricture (08-2008), Tubulovillous adenoma polyp of colon  (08-2008), Unspecified sinusitis (chronic), and Vision abnormalities. here with   Attention deficit  White matter abnormality on MRI of brain  Depression with anxiety  Memory difficulties  [name] [name] is doing well, today. Methylphenidate seems to help with attention and focus. She feels memory has worsening since being off for the past few weeks. MMSE 28/30, previously 30/30. PDMP shows appropriate refills. Last refill for 30 tablet on 08/23/2022. I have discussed importance of keeping this medication in a safe place and away from children or animals. I will place a refill, today. She will continue duloxetine 30mg  daily. She will continue healthy lifestyle habits. Continue close follow up with PCP. Follow up with me in 6 months. She verbalizes understanding and agreement with this plan.    No orders of the defined types were placed in this encounter.  No orders of the defined types were placed in this encounter.   I spent 30 minutes of face-to-face and non-face-to-face time with patient.  This included previsit chart review, lab review, study review, order entry, electronic health record documentation, patient education.   Shawnie Dapper, MSN, FNP-C 07/15/2023, 10:55 AM  [facility] [address], [address] [place], [place] [address] [phone]

## 2023-07-15 NOTE — Patient Instructions (Incomplete)

## 2023-07-18 ENCOUNTER — Ambulatory Visit: Payer: Medicare (Managed Care) | Admitting: Family Medicine

## 2023-07-18 ENCOUNTER — Encounter: Payer: Self-pay | Admitting: Family Medicine

## 2023-07-18 DIAGNOSIS — F418 Other specified anxiety disorders: Secondary | ICD-10-CM

## 2023-07-18 DIAGNOSIS — R413 Other amnesia: Secondary | ICD-10-CM

## 2023-07-18 DIAGNOSIS — R9082 White matter disease, unspecified: Secondary | ICD-10-CM

## 2023-07-18 DIAGNOSIS — R41840 Attention and concentration deficit: Secondary | ICD-10-CM

## 2023-07-30 NOTE — Progress Notes (Unsigned)
Subjective:    Patient ID: [name] [name], female    DOB: 1950/12/11, 73 y.o.   MRN: [id number]  HPI  female former smoker followed for asthma/COPD, allergic rhinitis/history nasal polyps/aspirin allergy, recurrent sinusitis, complicated by GERD, Post-Covid ILD,  Office Spirometry 06/01/12- moderate obstructive airways disease. FVC 3.15/85%, FEV1 1.87/65%, FEV1/FVC 0.59% FEF 25-75% 0.92/36% PFT 02/01/08- we compared her old PFT-mild obstructive airways disease with minimal response to bronchodilator. FVC 4.14/107%, FEV1 2.83/98%, FEV1/FVC 0.68, FEF 25-75% 1.72/56%. Scores have declined. PFT 07/20/2012-moderate obstructive airways disease with response to bronchodilator, normal lung volumes, diffusion mildly reduced. FVC 3.87/107%, FEV1 2.37/88%, FEV1/FVC 0.61, FEF 25-75% 1.12/39%. TLC 104%, DLCO 71%. 07/20/2012-98%, 97%, 98%, 444 m. Noted hip pain. No oxygen limitation. Echocardiogram 08/03/2012-EF 55-60%, gr 1 diastolic dysfunction. No pulmonary hypertension or wall motion abnormality FeNO-12/18/15- 13 PFT 08/03/2018-FVC 3.25/87%, FEV1 2.29/80%, ratio 0.70, FEF 25-75% 1.39/59%, NSC with dilator, TLC 98%, DLCO 88% Covid pneumonia 2020- CTchest HR- 05/15/2019-  1. 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 PFT 02/06/20- Diffusion mildly reduced  ONOX 05/15/21-Time 88% or less 4.5 minutes --------------------------------------------------------------------------------------------------------------   05/17/23- 73 year old female former smoker (10 pkyrs) followed for Asthma/COPD, Post Covid ILD/NSIP, allergic rhinitis/history nasal polyps/aspirin allergy, recurrent sinusitis, complicated by GERD, Covid pneumonia 2020, Low Back Pain, Osteoporosis, CAD,  -Anoro/ Advair 250, prednisone 10 mg/ alt 5  every other day, ProAir hfa, ritalin 10, Osteoporosis, CAD,  -Anoro, prednisone 10 alt 5 mg alternates daily, ProAir hfa,  CTa  chest PE 05/06/23 [facility]-- Neg for PE. Pos for small airway disease, ASCVD. -----More SOB with exertion.  Increased use of albuterol Arrival O2 sat 99%. Thinks DOE may be worse- inhalers don't seem as helpful. No abrupt change. Little wheeze or cough and no chest pain.  Hasn't been able to manage with prednisone less than 10 mg alt with 5 mg every other day.  Consider Biologic?? Lost sense of smell after 3 nasal polypectomies.  08/01/23- 73 year old female former smoker (10 pkyrs) followed for Asthma/COPD, Post Covid ILD/NSIP, allergic rhinitis/history nasal polyps/aspirin allergy, recurrent sinusitis, complicated by GERD, Covid pneumonia 2020, Low Back Pain, Osteoporosis, CAD,  -Anoro/ Advair 250, prednisone 10 mg/ alt 5  every other day, ProAir hfa, ritalin 10, Osteoporosis, CAD,  -Anoro, prednisone 10 alt 5 mg alternates daily, ProAir hfa,  -----Memory loss, ringing in ears, loss of appetite, not sleeping well at night. Less energy during day than at night.  More dyspnea with exertion x several months.  Seeing cardiologist at [facility]. Discussed the use of AI scribe software for clinical note transcription with the patient, who gave verbal consent to proceed.  History of Present Illness   The patient, with a history of lung disease, presents with increasing difficulty breathing. She describes the sensation as 'something sitting on my chest' and notes that she is 'breathing harder.' This occurs both at rest and with exertion, and does not seem to vary day-to-day. She uses an albuterol inhaler for relief, but it only sometimes helps. She also uses a Breo inhaler once daily, but does not feel it provides significant relief. CT in November was negative for PE. She is currently being evaluated by [facility] with stress test later this week.  No acute event, fever, cough or wheeze.  In addition to her breathing difficulties, the patient is struggling with insomnia. She has difficulty  falling asleep, but once asleep, she does not wake up during the night. She has tried melatonin without  success and reports feeling tired upon waking and energetic at night. She denies napping during the day and consumes caffeine, specifically tea.     ROS-see HPI   + = positive Constitutional:    weight loss, night sweats, fevers, chills, fatigue, lassitude. HEENT:    headaches, difficulty swallowing, tooth/dental problems, sore throat,       sneezing, itching, ear ache, +nasal congestion, post nasal drip, snoring CV:    chest pain, orthopnea, PND, swelling in lower extremities, anasarca,                                                    dizziness, palpitations Resp:  + shortness of breath with exertion or at rest.                productive cough, +  non-productive cough, coughing up of blood.              change in color of mucus.  wheezing.   Skin:    rash or lesions. GI:  No-   heartburn, indigestion, abdominal pain, nausea, vomiting,  GU:  MS:   joint pain, stiffness,  Neuro-     nothing unusual Psych:  change in mood or affect.  depression or anxiety.   memory loss.  OBJ- Physical Exam General- Alert, Oriented, Affect-appropriate, Distress- none acute Skin- rash-none, lesions- none, excoriation- none Lymphadenopathy- none Head- atraumatic            Eyes- Gross vision intact, PERRLA, conjunctivae and secretions clear            Ears- Hearing, canals-normal            Nose- not- stuffy, no-Septal dev, mucus, polyps, erosion, perforation             Throat- Mallampati III , mucosa clear/not dry , drainage- none, tonsils- atrophic , + dentures, Neck- flexible , trachea midline, no stridor , thyroid nl, carotid no bruit Chest - symmetrical excursion , unlabored           Heart/CV- RRR , no murmur , no gallop  , no rub, nl s1 s2                           - JVD- none , edema- none, stasis changes- none, varices- none           Lung- clear/ diminished wheeze- none, cough-none,   dullness-none, rub- none           Chest wall-  Abd-  Br/ Gen/ Rectal- Not done, not indicated Extrem- cyanosis- none, clubbing, none, atrophy- none, strength- nl Neuro- grossly intact to observation  Assessment and Plan    Dyspnea Increased shortness of breath at rest and with exertion. Cardiologist is currently evaluating with a stress test and a heart rhythm patch. Patient is also using Breo and Albuterol inhalers with limited relief. -Continue current inhaler regimen. -Consider repeat pulmonary function test to assess for worsening airflow obstruction. -Continue cardiology evaluation.  Insomnia Difficulty falling asleep, feeling unrested in the morning. No daytime napping. Possible caffeine intake in the afternoon. -Start Trazodone for sleep. -Avoid caffeine intake after lunch. -Consider decaf tea.  Chronic Steroid Use On Prednisone 10mg  alternating with 5mg  every other day. -Continue current regimen.  Follow-up after cardiology evaluation and stress test  results are available.

## 2023-08-01 ENCOUNTER — Encounter: Payer: Self-pay | Admitting: Internal Medicine

## 2023-08-01 ENCOUNTER — Ambulatory Visit: Payer: Medicare (Managed Care) | Admitting: Internal Medicine

## 2023-08-01 VITALS — BP 126/82 | HR 106 | Temp 97.7°F | Ht 69.0 in | Wt 147.8 lb

## 2023-08-01 DIAGNOSIS — J449 Chronic obstructive pulmonary disease, unspecified: Secondary | ICD-10-CM | POA: Diagnosis not present

## 2023-08-01 MED ORDER — TRAZODONE HCL 50 MG PO TABS: ORAL_TABLET | ORAL | 3 refills | Status: DC

## 2023-08-01 NOTE — Patient Instructions (Addendum)
Script sent to try Trazodone for sleep at night. Try taking in maybe 20 or 30 minutes before bed, so it has time to start working.  Avoid caffeine (coffee, tea, sodas) after lunch. Decaffeinated drinks are ok.  I hope the cardiology tests find issues that they can treat to make you feel better.

## 2023-08-02 ENCOUNTER — Telehealth: Payer: Self-pay | Admitting: Adult Health

## 2023-08-02 NOTE — Telephone Encounter (Signed)
Pt called in after clinic hours, complains that she started Trazodone yesterday, took it last night at bedtime and it has made her feel bacd all day, foggy headed, malaise/fatigue, bad all over. Has been lingering all day.  Denies chest pain, visual/speech changes or extremity weakness. Appetite is okay with no n/v/d.  Advised could be side effect of Trazodone to stop and not take any additional doses.  Push fluids and rest .  If not improving or worsens to seek urgent care /ER evaluation  .Please contact office for sooner follow up if symptoms do not improve or worsen or seek emergency care    Will send to Dr. Maple Hudson  for notification . She also wants to know what she will need to take for sleep if she can not tolerate Trazodone .

## 2023-08-03 ENCOUNTER — Encounter: Payer: Self-pay | Admitting: Internal Medicine

## 2023-08-03 ENCOUNTER — Encounter: Payer: Self-pay | Admitting: Family Medicine

## 2023-08-03 NOTE — Telephone Encounter (Signed)
Agree with stopping trazodone for now. We can consider trying it again later. For now, would not recommend another prescription sleep med. Instead try otc Zquil, Sominex or Unisom.

## 2023-08-04 MED ORDER — HYDROXYZINE HCL 25 MG PO TABS: ORAL_TABLET | ORAL | 1 refills | Status: DC

## 2023-08-04 NOTE — Telephone Encounter (Signed)
Adv pt of Dr's advise and she said these OTC do not work for her. They are not strong enough. She wanted me to get a message back to the Dr. About this. Also she has tried melatonin. Her # is [phone]

## 2023-08-04 NOTE — Telephone Encounter (Signed)
Script sent to [facility] for hydroxyzine for sleep. Try 1/2 or 1 tab as needed.

## 2023-08-05 NOTE — Telephone Encounter (Signed)
Spoke with the pt  This was already handled via mychart msg  Nothing further needed

## 2023-08-09 NOTE — Progress Notes (Unsigned)
 No chief complaint on file.   HISTORY OF PRESENT ILLNESS:  08/09/23 ALL: [name] [name] returns for follow up. She continues methylphenidate 10mg  and duloxetine 30mg  daily???  She is having difficulty sleeping. She was started on trazodone with Dr Maple Hudson, pulmonology, and felt it was too strong. Hydroxyzine called in last week.   10/05/2022 ALL:  [name] [name] returns for follow up. She has continued methylphenidate 10mg  and duloxetine 30mg  daily until recently (thinks the past month). She reports getting a puppy who she thinks has gotten her bottle of methylphenidate and moved it. She reports filling rx about 10 days ago but PDMP reports last refill 08/23/2022 for 30 tablets. She does not suspect the dog has taken any medication but she can not find the bottle. She reports being out of duloxetine as she thought it was on automatic refills but wasn't. She has requested a refill and waiting for pharmacy to get it ready. She does note being more forgetful over the past few weeks. She was diagnosed with a compression fracture 05/2022 and on oxycodone short term for pain management. Recently started on Fosamax for osteoporosis. S/P lumbar fusion 05/2021. She has not been sleeping as well. PCP recommended melatonin that does help. She was seen in follow up 09/27/2022 and started on doxepin. Unsure that she has filled this med. Back pain has resolved. She has had ringing in both ears. Recently seen by audiology and diagnosed with hearing loss. She continues Plavix and rosuvastatin. Last LDL 61 06/2022.  04/05/2022 ALL: [name] [name] returns for follow up for memory deficits. She continues methyphenidate 10mg  daily (rx for BID) and duloxetine 30mg  daily. She feels that she is doing fairly well. No significant change in memory. Mood seems to be good. She reports having a fainting spell last week. PCP saw her and reported blood work stable. She started iron transfusions for anemia in 02/2022. She passed out shortly after transfusion while  shopping at [place]. She reports drinking about 32 ounces of water daily. BP is up and down.   10/01/2021 ALL: [name] [name] returns for follow up for memory difficulty. She was last seen by Dr Epimenio Foot 03/2021. She reported worsening memory and irritability. She was advised to continue methyphenidate 10mg  up to twice daily and escitalopram was switched to duloxetine due to worsening mood in setting of low back pain.   Since, she feels that she is doing better. She is s/p lumbar spinal fusion. She feels pain is improving. She is walking without her cane. No falls. She continues to be cautious. She feels her balance is off at times. She feels that she has had to slow down a little which has bothered her. She continues methylphenidate 10mg  daily. She does not usually take second dose. She feels memory is stable. She is not sure if she started duloxetine. She does report taking escitalopram but unsure of dose.   She continues Plavix and rosuvastatin for stroke preventions. She is followed regularly by PCP. BP is usually well managed. She reports pain in tongue for the past few weeks and feels this is why it is elevated.   07/09/2020 ALL:  [name] [name] is a 73 y.o. female here today for follow up for memory difficulties. 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 No concerns for AZD at that time. She continues methylphenidate 10mg  daily and escitalopram 5mg  daily. She feels that she is doing ok. She is no longer working. She is considering stopping methylphenidate. She is  having more trouble with acid reflux. Medications have been changed due to concerns of long term PPI and Plavix use. She was started on Pepcid recently and has follow up with GI next month. No stroke or TIA symptoms.   HISTORY (copied from Dr Bonnita Hollow previous note)  [name] [name] is a 73 y.o. woman who reporting memory decline.    Update 01/02/2020: She feels memory is doing about  the same.   She gets some benefit from methylphenidate 10 mg daily.  She rarely a second pill.   She tries to keep her mind active with crafts and Sudoku and other games.   She was working - Warehouse manager.    She feels mood is doing well.   She occasionally gets irritable.    She takes Lexapro 5 mg daily.  Her husband has irritability and anger issues.    She denies any TIA or stroke symptoms.   She is on Plavix and tolerates it well.  MRI of the brain shows confluent white matter changes consistent with severe chronic microvascular ischemic change.   She reports that her gait is stable,   No numbness or weakness.  Bladder is doing well.   She had Covid summer 2020 and was hospitalized x 5 days in July/Aug.  She did not need ventilation but due to COPD and pneumonia needed to be observed.   Vascular risks:   H/o smoking.  She does not have HTN or DM.    REVIEW OF SYSTEMS: Out of a complete 14 system review of symptoms, the patient complains only of the following symptoms,memory loss, inattention, insomnia and all other reviewed systems are negative.   ALLERGIES: Allergies  Allergen Reactions   Aspirin Shortness Of Breath     HOME MEDICATIONS: Outpatient Medications Prior to Visit  Medication Sig Dispense Refill   Abaloparatide (TYMLOS) 3120 MCG/1.56ML SOPN Inject into the skin daily.     albuterol (VENTOLIN HFA) 108 (90 Base) MCG/ACT inhaler Inhale 2 puffs into the lungs every 6 (six) hours as needed for wheezing or shortness of breath. 8 g 2   alendronate (FOSAMAX) 70 MG tablet Take by mouth.     BREO ELLIPTA 100-25 MCG/ACT AEPB Inhale 1 puff into the lungs daily.     CALCIUM-VITAMIN D PO Take 1 Dose by mouth daily. 800U VIt D     clopidogrel (PLAVIX) 75 MG tablet TAKE 1 TABLET DAILY 90 tablet 3   famotidine (PEPCID) 20 MG tablet Take 20 mg by mouth daily as needed for heartburn.     hydrOXYzine (ATARAX) 25 MG tablet 1 tab as needed for sleep 30 tablet 1   ketoconazole (NIZORAL) 2 %  shampoo Apply 1 Application topically 2 (two) times a week.     losartan (COZAAR) 25 MG tablet Take 25 mg by mouth daily.     methylphenidate (RITALIN) 10 MG tablet Take 1 tablet (10 mg total) by mouth daily. 30 tablet 0   predniSONE (DELTASONE) 5 MG tablet Alternate 2 tabs with 1 tab every other day (Patient taking differently: Take 5 mg by mouth daily with breakfast. Alternate 2 tabs with 1 tab every other day) 140 tablet 3   promethazine (PHENERGAN) 25 MG suppository PLACE 1 SUPPOSITORY (25 MG TOTAL) RECTALLY EVERY 12 (TWELVE) HOURS AS NEEDED FOR NAUSEA OR VOMITING. 6 suppository 0   rosuvastatin (CRESTOR) 10 MG tablet Take 10 mg by mouth daily.     tacrolimus (PROTOPIC) 0.1 % ointment Apply topically 2 (two) times daily.  traMADol (ULTRAM) 50 MG tablet Take 50 mg by mouth every 8 (eight) hours as needed.     traZODone (DESYREL) 50 MG tablet 1 for sleep as needed 30 tablet 3   No facility-administered medications prior to visit.     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   Unspecified sinusitis (chronic)    Vision abnormalities      PAST SURGICAL HISTORY: Past Surgical History:  Procedure Laterality Date   BUNIONECTOMY     left foot   CHOLECYSTECTOMY  05/05/2012   Procedure: LAPAROSCOPIC CHOLECYSTECTOMY WITH INTRAOPERATIVE CHOLANGIOGRAM;  Surgeon: Atilano Ina, MD,FACS;  Location: [facility] OR;  Service: General;  Laterality: N/A;   COLONOSCOPY     COLONOSCOPY W/ BIOPSIES AND POLYPECTOMY     NASAL  POLYP SURGERY     x 3    POLYPECTOMY     THUMB FUSION  10/13   rt hand  pinned but pin removed   TONSILLECTOMY       FAMILY HISTORY: Family History  Problem Relation Age of Onset   Colon cancer Maternal Grandfather        unsure age of onset    Cancer Maternal Grandfather        colon   Stroke Maternal Aunt    Heart disease Father        Vague history   Prostate cancer Brother    Ataxia Daughter        died at age 65.5     SOCIAL HISTORY: Social History   Socioeconomic History   Marital status: Married    Spouse name: Not on file   Number of children: 2   Years of education: Not on file   Highest education level: Not on file  Occupational History   Occupation: Art gallery manager    Employer: [employer]  Tobacco Use   Smoking status: Former    Current packs/day: 0.00    Average packs/day: 0.5 packs/day for 20.0 years (10.0 ttl pk-yrs)    Types: Cigarettes    Start date: 06/14/1968    Quit date: 06/14/1988    Years since quitting: 35.1   Smokeless tobacco: Never  Vaping Use   Vaping status: Never Used  Substance and Sexual Activity   Alcohol use: No   Drug use: No   Sexual activity: Yes    Birth control/protection: Post-menopausal  Other Topics Concern   Not on file  Social History Narrative   Lives with husband.     Social Drivers of Corporate investment banker Strain: Low Risk  (06/26/2023)   Received from [facility]   Overall Financial Resource Strain (CARDIA)    Difficulty of Paying Living Expenses: Not hard at all  Food Insecurity: Low Risk  (06/29/2023)   Received from [facility]   Hunger Vital Sign    Worried About Running Out of Food in the Last Year: Never true    Ran Out of  Food in the Last Year: Never true  Transportation Needs: No Transportation Needs (06/29/2023)   Received from [facility]    In the past 12 months, has lack of reliable transportation kept you from medical appointments, meetings, work or  from getting things needed for daily living? : No  Physical Activity: Unknown (06/26/2023)   Received from [facility]   Exercise Vital Sign    Days of Exercise per Week: 0 days    Minutes of Exercise per Session: Not on file  Stress: No Stress Concern Present (06/26/2023)   Received from [facility] - Occupational Stress Questionnaire    Feeling of Stress : Not at all  Social Connections: Patient Declined (06/26/2023)   Received from [facility]   Social Network    How would you rate your social network (family, work, friends)?: Patient declined  Intimate Partner Violence: Not At Risk (06/26/2023)   Received from [facility]   HITS    Over the last 12 months how often did your partner physically hurt you?: Never    Over the last 12 months how often did your partner insult you or talk down to you?: Never    Over the last 12 months how often did your partner threaten you with physical harm?: Never    Over the last 12 months how often did your partner scream or curse at you?: Never      PHYSICAL EXAM  There were no vitals filed for this visit.     There is no height or weight on file to calculate BMI.   Generalized: Well developed, in no acute distress  Cardiology: normal rate and rhythm, no murmur auscultated  Respiratory: clear to auscultation bilaterally    Neurological examination  Mentation: Alert oriented to time, place, history taking. Follows all commands speech and language fluent Cranial nerve II-XII: Pupils were equal round reactive to light. Extraocular movements were full, visual field were full on confrontational test. Facial sensation and strength were normal. Head turning and shoulder shrug  were normal and symmetric. Motor: The motor testing reveals 5 over 5 strength of all 4 extremities. Good symmetric motor tone is noted throughout.  Gait and station: Gait is stable without assistive device    DIAGNOSTIC  DATA (LABS, IMAGING, TESTING) - I reviewed patient records, labs, notes, testing and imaging myself where available.  Lab Results  Component Value Date   WBC 6.8 01/02/2020   HGB 12.5 01/02/2020   HCT 37.8 01/02/2020   MCV 82 01/02/2020   PLT 332 01/02/2020      Component Value Date/Time   NA 141 01/02/2020 1448   K 4.5 01/02/2020 1448   CL 104 01/02/2020 1448   CO2 24 01/02/2020 1448   GLUCOSE 109 (H) 01/02/2020 1448   GLUCOSE 95 11/02/2017 1117   BUN 21 01/02/2020 1448   CREATININE 1.17 (H) 01/02/2020 1448   CALCIUM 9.4 01/02/2020 1448   PROT 6.8 01/02/2020 1448   ALBUMIN 4.7 01/02/2020 1448   AST 17 01/02/2020 1448   ALT 9 01/02/2020 1448   ALKPHOS 78 01/02/2020 1448   BILITOT 0.3 01/02/2020 1448   GFRNONAA 48 (L) 01/02/2020 1448   GFRAA 55 (L) 01/02/2020 1448   Lab Results  Component Value Date   CHOL 228 (H) 09/01/2012   HDL 60.00 09/01/2012   LDLDIRECT 146.4 09/01/2012   TRIG 167.0 (H) 09/01/2012   CHOLHDL 4 09/01/2012   Lab Results  Component Value Date   HGBA1C 5.7 09/01/2012   Lab Results  Component Value Date   VITAMINB12 507 10/02/2014   Lab Results  Component Value Date   TSH 0.560 01/02/2020      10/05/2022    2:11 PM 04/05/2022    2:32 PM 10/01/2021    2:28 PM  MMSE - Mini Mental State Exam  Orientation to time 4 5 4   Orientation to Place 5 5 5   Registration 3 3 3   Attention/ Calculation 4 5 3   Recall 3 3 3   Language- name 2 objects 2 2 2   Language- repeat 1 1 1   Language- follow 3 step command 3 3 3   Language- read & follow direction 1 1 1   Write a sentence 1 1 1   Copy design 1 1 1   Total score 28 30 27      ASSESSMENT AND PLAN  73 y.o. 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 here with   Memory difficulties  Depression with anxiety  Attention deficit  White matter abnormality on MRI of brain  Ebony is doing well, today. Methylphenidate seems to help with attention and focus. She feels memory has worsening since being off for the past few weeks. MMSE 28/30, previously 30/30. PDMP shows appropriate refills. Last refill for 30 tablet on 08/23/2022. I have discussed importance of keeping this medication in a safe place and away from children or animals. I will place a refill, today. She will continue duloxetine 30mg  daily. She will continue healthy lifestyle habits. Continue close follow up with PCP. Follow up with me in 6 months. She verbalizes understanding and agreement with this plan.    No orders of the defined types were placed in this encounter.  No orders of the defined types were placed in this encounter.   I spent 30 minutes of face-to-face and non-face-to-face time with patient.  This included previsit chart review, lab review, study review, order entry, electronic health record documentation, patient education.   Shawnie Dapper, MSN, FNP-C 08/09/2023, 10:52 AM  [facility] [address], [address] [place], [place] [address] [phone]

## 2023-08-09 NOTE — Patient Instructions (Incomplete)
Below is our plan:  We will continue Aricept (donepezil) 5mg  daily for now. We can consider stopping this medication if you feel it is worsening tremor. We could consider changing/adding Namenda (memantine). I have included information in this packet.   Please make sure you are staying well hydrated. I recommend 50-60 ounces daily. Well balanced diet and regular exercise encouraged. Consistent sleep schedule with 6-8 hours recommended.   Please continue follow up with care team as directed.   Follow up with me in 6 months   You may receive a survey regarding today's visit. I encourage you to leave honest feed back as I do use this information to improve patient care. Thank you for seeing me today!   Management of Memory Problems   There are some general things you can do to help manage your memory problems.  Your memory may not in fact recover, but by using techniques and strategies you will be able to manage your memory difficulties better.   1)  Establish a routine. Try to establish and then stick to a regular routine.  By doing this, you will get used to what to expect and you will reduce the need to rely on your memory.  Also, try to do things at the same time of day, such as taking your medication or checking your calendar first thing in the morning. Think about think that you can do as a part of a regular routine and make a list.  Then enter them into a daily planner to remind you.  This will help you establish a routine.   2)  Organize your environment. Organize your environment so that it is uncluttered.  Decrease visual stimulation.  Place everyday items such as keys or cell phone in the same place every day (ie.  Basket next to front door) Use post it notes with a brief message to yourself (ie. Turn off light, lock the door) Use labels to indicate where things go (ie. Which cupboards are for food, dishes, etc.) Keep a notepad and pen by the telephone to take messages   3)  Memory  Aids A diary or journal/notebook/daily planner Making a list (shopping list, chore list, to do list that needs to be done) Using an alarm as a reminder (kitchen timer or cell phone alarm) Using cell phone to store information (Notes, Calendar, Reminders) Calendar/White board placed in a prominent position Post-it notes   In order for memory aids to be useful, you need to have good habits.  It's no good remembering to make a note in your journal if you don't remember to look in it.  Try setting aside a certain time of day to look in journal.   4)  Improving mood and managing fatigue. There may be other factors that contribute to memory difficulties.  Factors, such as anxiety, depression and tiredness can affect memory. Regular gentle exercise can help improve your mood and give you more energy. Exercise: there are short videos created by the [employer] on Health specially for older adults: https://bit.ly/2I30q97.  Mediterranean diet: which emphasizes fruits, vegetables, whole grains, legumes, fish, and other seafood; unsaturated fats such as olive oils; and low amounts of red meat, eggs, and sweets. A variation of this, called MIND (Mediterranean-DASH Intervention for Neurodegenerative Delay) incorporates the DASH (Dietary Approaches to Stop Hypertension) diet, which has been shown to lower high blood pressure, a risk factor for Alzheimer's disease. More information at: ExitMarketing.de.  Aerobic exercise that improve heart health is also  good for the mind.  [employer] on Aging have short videos for exercises that you can do at home: BlindWorkshop.com.pt Simple relaxation techniques may help relieve symptoms of anxiety Try to get back to completing activities or hobbies you enjoyed doing in the past. Learn to pace yourself through activities to decrease fatigue. Find out about some local support groups  where you can share experiences with others. Try and achieve 7-8 hours of sleep at night.   Resources for Family/Caregiver  Online caregiver support groups can be found at WesternTunes.it or call Alzheimer's Association's 24/7 hotline: (603)715-0386. [facility] offers in-person, virtual support groups and individual counseling for both care partners and persons with memory loss. Call for more information at ([phone])885-8490.   Advanced care plan: there are two types of Power of Attorney: healthcare and durable. Healthcare POA is a designated person to make healthcare decisions on your behalf if you were too sick to make them yourself. This person can be selected and documented by your physician. Durable POA has to be set up with a lawyer who takes charge of your finances and estate if you were too sick or cognitively impaired to manage your finances accurately. You can find a local Elder Therapist, art here: [place].at.  Check out www.planyourlifespan.org, which will help you plan before a crisis and decide who will take care of life considerations in a circumstance where you may not be able to speak for yourself.   Helpful books (available on [employer] or your local bookstore):  By Dr. Carl Best: Keeping Love Alive as Memories Fade: The 5 Love Languages and the Alzheimer's Journey Mar 15, 2015 The Dementia Care Partner's Workbook: A Guide for Understanding, Education, and [employer] - November 12, 2017.  Both available for less than $15.   "Coping with behavior change in dementia: a family caregiver's guide" by Ricardo Jericho & Valora Piccolo "A Caregiver's Guide to Dementia: Using Activities and Other Strategies to Prevent, Reduce and Manage Behavioral Symptoms" by Mahala Menghini Gitlin and [employer].  Youth worker of Joy for the Person with Alzheimer's or Dementia" 4th edition by Tama High  Caregiver videos on common behaviors related to dementia:  PopulationGame.pl  [place] Caregiver Portal: free to sign up, links to local resources: https://[place]-caregivers.com/login    Tasks to improve attention/working memory 1. Good sleep hygiene (7-8 hrs of sleep) 2. Learning a new skill (Painting, Carpentry, Pottery, new language, Knitting). 3.Cognitive exercises (keep a daily journal, Puzzles) 4. Physical exercise and training  (30 min/day X 4 days week) 5. Being on Antidepressant if needed 6.Yoga, Meditation, Tai Chi 7. Decrease alcohol intake 8.Have a clear schedule and structure in daily routine   MIND Diet: The Mediterranean-DASH Diet Intervention for Neurodegenerative Delay, or MIND diet, targets the health of the aging brain. Research participants with the highest MIND diet scores had a significantly slower rate of cognitive decline compared with those with the lowest scores. The effects of the MIND diet on cognition showed greater effects than either the Mediterranean or the DASH diet alone.   The healthy items the MIND diet guidelines suggest include:   3+ servings a day of whole grains 1+ servings a day of vegetables (other than green leafy) 6+ servings a week of green leafy vegetables 5+ servings a week of nuts 4+ meals a week of beans 2+ servings a week of berries 2+ meals a week of poultry 1+ meals a week of fish Mainly olive oil if added fat is used  The unhealthy items, which are higher in saturated and trans fat, include: Less than 5 servings a week of pastries and sweets Less than 4 servings a week of red meat (including beef, pork, lamb, and products made from these meats) Less than one serving a week of cheese and fried foods Less than 1 tablespoon a day of butter/stick margarine

## 2023-08-11 ENCOUNTER — Encounter: Payer: Self-pay | Admitting: Family Medicine

## 2023-08-11 ENCOUNTER — Ambulatory Visit: Payer: Medicare (Managed Care) | Admitting: Family Medicine

## 2023-08-11 VITALS — BP 118/76 | HR 80 | Ht 69.0 in | Wt 151.0 lb

## 2023-08-11 DIAGNOSIS — F418 Other specified anxiety disorders: Secondary | ICD-10-CM

## 2023-08-11 DIAGNOSIS — R41840 Attention and concentration deficit: Secondary | ICD-10-CM

## 2023-08-11 DIAGNOSIS — R9082 White matter disease, unspecified: Secondary | ICD-10-CM

## 2023-08-11 DIAGNOSIS — R413 Other amnesia: Secondary | ICD-10-CM | POA: Diagnosis not present

## 2023-08-26 ENCOUNTER — Other Ambulatory Visit: Payer: Self-pay | Admitting: Internal Medicine

## 2023-08-26 NOTE — Telephone Encounter (Signed)
 Dr. Maple Hudson,  Patient would like a 90 days supply.  Can we send this in?  Please advise.  30 tablets with 1 refill was original rx.

## 2023-08-29 ENCOUNTER — Other Ambulatory Visit: Payer: Self-pay | Admitting: Neurology

## 2023-08-30 NOTE — Telephone Encounter (Signed)
Hydroxyzine refilled

## 2023-09-06 ENCOUNTER — Ambulatory Visit: Payer: Medicare (Managed Care) | Admitting: Neurology

## 2023-10-24 ENCOUNTER — Other Ambulatory Visit: Payer: Self-pay

## 2023-10-24 ENCOUNTER — Encounter: Payer: Self-pay | Admitting: Internal Medicine

## 2023-10-24 MED ORDER — PREDNISONE 5 MG PO TABS: ORAL_TABLET | ORAL | 0 refills | Status: DC

## 2023-10-24 NOTE — Telephone Encounter (Signed)
 Per office visit with Dr. Linder Revere on 08/01/2023:  On Prednisone  10mg  alternating with 5mg  every other day. -Continue current regimen. Return in about 6 months (around 01/29/2024).   Will okay prednisone  x one refill (90 days supply).

## 2023-10-26 ENCOUNTER — Encounter: Payer: Self-pay | Admitting: Neurology

## 2023-10-28 MED ORDER — PREDNISONE 5 MG (21) PO TBPK: ORAL_TABLET | ORAL | 0 refills | Status: DC

## 2023-10-28 NOTE — Telephone Encounter (Signed)
 Prednisone  # 25 tabs sent to [facility]

## 2023-10-30 ENCOUNTER — Encounter: Payer: Self-pay | Admitting: Family Medicine

## 2023-10-31 ENCOUNTER — Ambulatory Visit: Payer: Medicare (Managed Care) | Admitting: Family Medicine

## 2023-11-14 ENCOUNTER — Other Ambulatory Visit: Payer: Self-pay | Admitting: Internal Medicine

## 2023-11-14 MED ORDER — PREDNISONE 5 MG (21) PO TBPK: ORAL_TABLET | ORAL | 2 refills | Status: DC

## 2023-11-14 MED ORDER — PREDNISONE 5 MG PO TABS: ORAL_TABLET | ORAL | 2 refills | Status: DC

## 2023-11-14 NOTE — Telephone Encounter (Signed)
 [facility] sent. Local script sent to [facility]

## 2023-11-21 MED ORDER — UMECLIDINIUM-VILANTEROL 62.5-25 MCG/ACT IN AEPB: INHALATION_SPRAY | RESPIRATORY_TRACT | 5 refills | Status: DC

## 2023-11-21 NOTE — Addendum Note (Signed)
 Addended by: Rosa College D on: 11/21/2023 01:24 PM   Modules accepted: Orders

## 2023-11-21 NOTE — Telephone Encounter (Signed)
 [facility] sent to [facility] for Anoro maintenance inhaler to replace Trelegy.  If she wants short trerm script sent to her drug store, let me know.

## 2023-11-22 ENCOUNTER — Encounter: Payer: Self-pay | Admitting: Internal Medicine

## 2023-11-23 ENCOUNTER — Telehealth: Payer: Self-pay

## 2023-11-23 ENCOUNTER — Other Ambulatory Visit: Payer: Self-pay | Admitting: Internal Medicine

## 2023-11-23 MED ORDER — UMECLIDINIUM-VILANTEROL 62.5-25 MCG/ACT IN AEPB: 1.0000 | INHALATION_SPRAY | Freq: Every day | RESPIRATORY_TRACT | 3 refills | Status: AC

## 2023-11-23 NOTE — Telephone Encounter (Signed)
 Advise change request

## 2023-11-23 NOTE — Telephone Encounter (Addendum)
 Called patient to inform of Anoro called into pharmacy.  Patient states Dr. Linder Revere took her off Anoro due to being ineffective and put her on Trelegy.  Trelegy too expensive.  Insurance cost was $135 for a 90 days supply.  Patient cannot afford this.  Patient would like Dr. Linder Revere to check to see if there is another inhaler he could call in that is cheaper than the Trelegy. Will send PA request into PA team for Dr. Antonette Batters review.  Prior Auth Team:  Can you check for patient to see what inhalers are covered in place of the Anoro and Trelegy?  Anoro ineffective and Trelegy too expensive.  Thank you.

## 2023-11-23 NOTE — Telephone Encounter (Signed)
 Copied from CRM [id number]. Topic: Clinical - Prescription Issue >> Nov 22, 2023  9:54 AM [name] [name] wrote: Reason for CRM: Patient got a call from her mail order pharmacy and she stated Dr.Young changed her inhaler and wanted to know if he meant  to switch her to an old prescription and if so, she needs something sent to her old pharmacy in until she can get the prescription in the mail.  Dr.Young can you please advise   Thank you

## 2023-11-23 NOTE — Telephone Encounter (Unsigned)
 Copied from CRM [id number]. Topic: Clinical - Prescription Issue >> Nov 22, 2023  9:54 AM Corean Deutscher wrote: Reason for CRM: Patient got a call from her mail order pharmacy and she stated Dr.Young changed her inhaler and wanted to know if he meant  to switch her to an old prescription and if so, she needs something sent to her old pharmacy in until she can get the prescription in the mail.

## 2023-11-23 NOTE — Telephone Encounter (Signed)
 I think she has a script for Anoro to her mail order pharmacy, and I just sent as requested, a local script for Anoro so she could get it filled until mail order script arrives.

## 2023-11-23 NOTE — Telephone Encounter (Signed)
 [facility] order script comes. If Anoro doesn't work well, please let me know.

## 2023-11-28 ENCOUNTER — Telehealth: Payer: Self-pay

## 2023-11-28 ENCOUNTER — Other Ambulatory Visit (HOSPITAL_COMMUNITY): Payer: Self-pay

## 2023-11-28 NOTE — Telephone Encounter (Signed)
 Other triple therapy of Breztri  is not covered-patient may be able to do 1 month at a time (test claim shows $47.00 via [facility] so the 90 day does save her some money). Alternative would be to do multiple inhalers to complete triple therapy and this would be more expensive than the Trelegy.

## 2023-11-28 NOTE — Telephone Encounter (Signed)
 Called patient.  Patient states she cannot afford the Trelegy.  She can get the Anoro for free through [facility].  She wants to try Anoro again, as maybe it will work better this time.  If the Anoro does not help with sx again, patient will call back and she will have Dr. Linder Revere call in Trelegy and she will try to pay for a one month supply of this at $47 at via [facility].    Patient states she has already picked up an Anoro at a local pharmacy.

## 2023-11-28 NOTE — Telephone Encounter (Signed)
 Copied from CRM [id number]. Topic: Clinical - Prescription Issue >> Nov 25, 2023  3:46 PM [name] [name] O wrote: Reason for CRM: patient is calling cause the pharmacy called and they are needing more information for the prescription . They told patient to reach out to provider . The pharmacy has not heard back from the doctor and they can not fill prescription cause they need additional information  umeclidinium-vilanterol (ANORO ELLIPTA ) 62.5-25 MCG/ACT AEPB  She said the nurse said not to run out  Spoke with patient regarding prior message. Patient was able to pick up her Anoro inhaler.Patient's voice was understanding.Nothing else further needed.

## 2023-12-02 ENCOUNTER — Telehealth: Payer: Self-pay

## 2023-12-02 NOTE — Telephone Encounter (Signed)
 I would not expect either Anoro or Trelegy to explain leg weakness. I suggest you rest, pace yourself in this hot weather, and see how you do. You couldn't afford Trelegy,  but if you want to come to the office for a sample, we could leave you a sample of Trelegy 100 to try again with that.

## 2023-12-02 NOTE — Telephone Encounter (Signed)
 Dr Linder Revere,   Please advise any recommendations or if pt should continue Anoro and stop and go back to Trelegy.

## 2023-12-13 NOTE — Telephone Encounter (Signed)
 Drug store is asking for alternative to Anoro. I think patient's concern is cost and maybe effefctiveness. Alternatives would be Stiolto,  Bevespi .   Is it ok with the patient if we have drug store give her whichever is cheapest?

## 2024-02-22 ENCOUNTER — Ambulatory Visit: Payer: Medicare (Managed Care) | Admitting: Neurology

## 2024-02-22 ENCOUNTER — Encounter: Payer: Self-pay | Admitting: Neurology

## 2024-02-23 ENCOUNTER — Ambulatory Visit: Payer: Medicare (Managed Care) | Admitting: Family Medicine

## 2024-02-23 ENCOUNTER — Encounter: Payer: Self-pay | Admitting: Family Medicine

## 2024-02-23 VITALS — BP 150/97 | HR 78 | Ht 69.0 in | Wt 149.4 lb

## 2024-02-23 DIAGNOSIS — R9082 White matter disease, unspecified: Secondary | ICD-10-CM

## 2024-02-23 DIAGNOSIS — F418 Other specified anxiety disorders: Secondary | ICD-10-CM | POA: Diagnosis not present

## 2024-02-23 DIAGNOSIS — R41840 Attention and concentration deficit: Secondary | ICD-10-CM

## 2024-02-23 DIAGNOSIS — R413 Other amnesia: Secondary | ICD-10-CM | POA: Diagnosis not present

## 2024-02-23 NOTE — Progress Notes (Signed)
 Chief Complaint  Patient presents with   Follow-up    Pt in room 2. Alone. Here for memory follow up. MMSE:26    HISTORY OF PRESENT ILLNESS:  02/23/24 ALL: [name] [name] returns for follow up for memory difficulty. She was last seen by me 07/2023 and reported more difficulty with insomnia. PCP had started mirtazipine but she had not started the medication. Since, she reports stopping methylphenidate  about 6-8 months ago.   She usually goes to bed around midnight. She wakes between 5a-8a. She usually wakes up to let her dog outside then goes back to sleep. She does not nap.  She does not exercise. She needs hip surgery. She is not sure if she ever took the mirtazapine written by PCP.   She was seen by [name] [name] with [facility] and sleep. Sleep study showed mild OSA with AHI 13.9/hr. She reports trying on masks with [employer] but could not tolerate the air pressure and did not take the machine home with her. She does not feel she can tolerate therapy.   She drives without difficulty. Able to manage her medications independently. Lives with her husband in [place].   08/11/2023 ALL:  [name] [name] returns for follow up. She was last seen 09/2022 and continued continued methylphenidate  10mg  and duloxetine  30mg  daily. She tells me that she stopped duloxetine  as she did not feel it was working. She has had more depression. She is not sleeping well. She was started on trazodone  with Dr Neysa, pulmonology, and felt it was too strong. Hydroxyzine  called in last week. She previously failed doxepin. She saw PCP 2/24 and she started mirtazapine 7.5mg  at bedtime. [name] [name] reports she was not aware this medication was called in. She may go to bed anytime between 8p and 12p. She wakes between 7a-12p. She usually takes methylphenidate  when she wakes up but admits it could be 12-1p.   She continues Plavix  and rosuvastatin. No stroke symptoms. Memory stable. She lives with her husband. Able to perform ADLs, drive and  manage home/meds without difficulty.   10/05/2022 ALL:  [name] [name] returns for follow up. She has continued methylphenidate  10mg  and duloxetine  30mg  daily until recently (thinks the past month). She reports getting a puppy who she thinks has gotten her bottle of methylphenidate  and moved it. She reports filling rx about 10 days ago but PDMP reports last refill 08/23/2022 for 30 tablets. She does not suspect the dog has taken any medication but she can not find the bottle. She reports being out of duloxetine  as she thought it was on automatic refills but wasn't. She has requested a refill and waiting for pharmacy to get it ready. She does note being more forgetful over the past few weeks. She was diagnosed with a compression fracture 05/2022 and on oxycodone  short term for pain management. Recently started on Fosamax for osteoporosis. S/P lumbar fusion 05/2021. She has not been sleeping as well. PCP recommended melatonin that does help. She was seen in follow up 09/27/2022 and started on doxepin. Unsure that she has filled this med. Back pain has resolved. She has had ringing in both ears. Recently seen by audiology and diagnosed with hearing loss. She continues Plavix  and rosuvastatin. Last LDL 61 06/2022.  04/05/2022 ALL: [name] [name] returns for follow up for memory deficits. She continues methyphenidate 10mg  daily (rx for BID) and duloxetine  30mg  daily. She feels that she is doing fairly well. No significant change in memory. Mood seems to be good. She reports having a fainting spell  last week. PCP saw her and reported blood work stable. She started iron transfusions for anemia in 02/2022. She passed out shortly after transfusion while shopping at [place]. She reports drinking about 32 ounces of water daily. BP is up and down.   10/01/2021 ALL: [name] [name] returns for follow up for memory difficulty. She was last seen by Dr Vear 03/2021. She reported worsening memory and irritability. She was advised to continue methyphenidate  10mg  up to twice daily and escitalopram  was switched to duloxetine  due to worsening mood in setting of low back pain.   Since, she feels that she is doing better. She is s/p lumbar spinal fusion. She feels pain is improving. She is walking without her cane. No falls. She continues to be cautious. She feels her balance is off at times. She feels that she has had to slow down a little which has bothered her. She continues methylphenidate  10mg  daily. She does not usually take second dose. She feels memory is stable. She is not sure if she started duloxetine . She does report taking escitalopram  but unsure of dose.   She continues Plavix  and rosuvastatin for stroke preventions. She is followed regularly by PCP. BP is usually well managed. She reports pain in tongue for the past few weeks and feels this is why it is elevated.   07/09/2020 ALL:  [name] [name] is a 73 y.o. female here today for follow up for memory difficulties. 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 No concerns for AZD at that time. She continues methylphenidate  10mg  daily and escitalopram  5mg  daily. She feels that she is doing ok. She is no longer working. She is considering stopping methylphenidate . She is having more trouble with acid reflux. Medications have been changed due to concerns of long term PPI and Plavix  use. She was started on Pepcid recently and has follow up with GI next month. No stroke or TIA symptoms.   HISTORY (copied from Dr Duncan previous note)  [name] [name] is a 73 y.o. woman who reporting memory decline.    Update 01/02/2020: She feels memory is doing about the same.   She gets some benefit from methylphenidate  10 mg daily.  She rarely a second pill.   She tries to keep her mind active with crafts and Sudoku and other games.   She was working - Warehouse manager.    She feels mood is doing well.   She occasionally gets irritable.    She takes Lexapro  5  mg daily.  Her husband has irritability and anger issues.    She denies any TIA or stroke symptoms.   She is on Plavix  and tolerates it well.  MRI of the brain shows confluent white matter changes consistent with severe chronic microvascular ischemic change.   She reports that her gait is stable,   No numbness or weakness.  Bladder is doing well.   She had Covid summer 2020 and was hospitalized x 5 days in July/Aug.  She did not need ventilation but due to COPD and pneumonia needed to be observed.   Vascular risks:   H/o smoking.  She does not have HTN or DM.    REVIEW OF SYSTEMS: Out of a complete 14 system review of symptoms, the patient complains only of the following symptoms,memory loss, inattention, insomnia and all other reviewed systems are negative.   ALLERGIES: Allergies  Allergen Reactions   Aspirin Shortness Of Breath     HOME MEDICATIONS: Outpatient Medications  Prior to Visit  Medication Sig Dispense Refill   Abaloparatide (TYMLOS) 3120 MCG/1.56ML SOPN Inject into the skin daily.     albuterol  (VENTOLIN  HFA) 108 (90 Base) MCG/ACT inhaler Inhale 2 puffs into the lungs every 6 (six) hours as needed for wheezing or shortness of breath. 8 g 2   CALCIUM-VITAMIN D  PO Take 1 Dose by mouth daily. 800U VIt D     clopidogrel  (PLAVIX ) 75 MG tablet TAKE 1 TABLET DAILY 90 tablet 3   losartan (COZAAR) 25 MG tablet Take 25 mg by mouth daily.     predniSONE  (DELTASONE ) 5 MG tablet Alternate 2 tabs with 1 tab every other day 140 tablet 2   promethazine  (PHENERGAN ) 25 MG suppository PLACE 1 SUPPOSITORY (25 MG TOTAL) RECTALLY EVERY 12 (TWELVE) HOURS AS NEEDED FOR NAUSEA OR VOMITING. 6 suppository 0   rosuvastatin (CRESTOR) 10 MG tablet Take 10 mg by mouth daily.     traMADol (ULTRAM) 50 MG tablet Take 50 mg by mouth every 8 (eight) hours as needed.     umeclidinium-vilanterol (ANORO ELLIPTA ) 62.5-25 MCG/ACT AEPB Inhale 1 puff into the lungs daily. 60 each 3   famotidine (PEPCID) 20 MG tablet  Take 20 mg by mouth daily as needed for heartburn. (Patient not taking: Reported on 02/23/2024)     hydrOXYzine  (ATARAX ) 25 MG tablet 1 TAB AS NEEDED FOR SLEEP (Patient not taking: Reported on 02/23/2024) 90 tablet 3   methylphenidate  (RITALIN ) 10 MG tablet Take 1 tablet (10 mg total) by mouth daily. (Patient not taking: Reported on 02/23/2024) 30 tablet 0   predniSONE  (STERAPRED UNI-PAK 21 TAB) 5 MG (21) TBPK tablet Alternate 2 tabs with one tab every other day (Patient not taking: Reported on 02/23/2024) 25 each 2   tacrolimus (PROTOPIC) 0.1 % ointment Apply topically 2 (two) times daily. (Patient not taking: Reported on 02/23/2024)     umeclidinium-vilanterol (ANORO ELLIPTA ) 62.5-25 MCG/ACT AEPB One inhalation daily (Patient not taking: Reported on 02/23/2024) 180 each 5   No facility-administered medications prior to visit.     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   Unspecified sinusitis (chronic)    Vision abnormalities      PAST SURGICAL HISTORY: Past Surgical History:  Procedure Laterality Date   BUNIONECTOMY     left foot   CHOLECYSTECTOMY  05/05/2012   Procedure: LAPAROSCOPIC CHOLECYSTECTOMY WITH INTRAOPERATIVE CHOLANGIOGRAM;  Surgeon: Camellia CHRISTELLA Blush, MD,FACS;  Location: [facility] OR;  Service: General;  Laterality: N/A;   COLONOSCOPY     COLONOSCOPY W/ BIOPSIES AND POLYPECTOMY     NASAL POLYP SURGERY     x 3    POLYPECTOMY     THUMB FUSION  10/13   rt hand  pinned but pin  removed   TONSILLECTOMY       FAMILY HISTORY: Family History  Problem Relation Age of Onset   Alzheimer's disease Mother    Heart disease  Father        Vague history   Prostate cancer Brother    Stroke Maternal Aunt    Colon cancer Maternal Grandfather        unsure age of onset    Cancer Maternal Grandfather        colon   Ataxia Daughter        died at age 56.5     SOCIAL HISTORY: Social History   Socioeconomic History   Marital status: Married    Spouse name: Not on file   Number of children: 2   Years of education: Not on file   Highest education level: Not on file  Occupational History   Occupation: Art gallery manager    Employer: [employer]  Tobacco Use   Smoking status: Former    Current packs/day: 0.00    Average packs/day: 0.5 packs/day for 20.0 years (10.0 ttl pk-yrs)    Types: Cigarettes    Start date: 06/14/1968    Quit date: 06/14/1988    Years since quitting: 35.7   Smokeless tobacco: Never  Vaping Use   Vaping status: Never Used  Substance and Sexual Activity   Alcohol use: No   Drug use: No   Sexual activity: Yes    Birth control/protection: Post-menopausal  Other Topics Concern   Not on file  Social History Narrative   Lives with husband.     Retired    Teacher, early years/pre Strain: Low Risk  (06/26/2023)   Received from [employer]   Overall Financial Resource Strain (CARDIA)    Difficulty of Paying Living Expenses: Not hard at all  Food Insecurity: Low Risk  (06/29/2023)   Received from [facility]   Hunger Vital Sign    Within the past 12 months, you worried that your food would run out before you got money to buy more: Never true    Within the past 12 months, the food you bought just didn't last and you didn't have money to get more. : Never true  Transportation Needs: No Transportation Needs (06/29/2023)   Received from [facility]    In the past 12 months, has lack of  reliable transportation kept you from medical appointments, meetings, work or from getting things needed for daily living? : No  Physical Activity: Unknown (06/26/2023)   Received from [facility]   Exercise Vital Sign    On average, how many days per week do you engage in moderate to strenuous exercise (like a brisk walk)?: 0 days    Minutes of Exercise per Session: Not on file  Stress: No Stress Concern Present (06/26/2023)   Received from [facility] Occupational [facility] - Occupational Stress Questionnaire    Feeling of Stress : Not at all  Social Connections: Patient Declined (06/26/2023)   Received from [facility]   Social Network    How would you rate your social network (family, work, friends)?: Patient declined  Intimate Partner Violence: Not At Risk (06/26/2023)   Received from [facility]   HITS    Over the last 12 months how often did your partner physically hurt you?: Never    Over the last 12 months how often did your partner insult you or talk down to you?: Never    Over the last 12 months how often did your partner threaten you with physical harm?: Never    Over the last 12 months how often  did your partner scream or curse at you?: Never      PHYSICAL EXAM  Vitals:   02/23/24 1354  BP: (!) 150/97  Pulse: 78  SpO2: 99%  Weight: 149 lb 6.4 oz (67.8 kg)  Height: 5' 9 (1.753 m)        Body mass index is 22.06 kg/m.   Generalized: Well developed, in no acute distress  Cardiology: normal rate and rhythm, no murmur auscultated  Respiratory: clear to auscultation bilaterally    Neurological examination  Mentation: Alert oriented to time, place, history taking. Follows all commands speech and language fluent Cranial nerve II-XII: Pupils were equal round reactive to light. Extraocular movements were full, visual field were full on confrontational test. Facial sensation and strength were normal. Head turning and shoulder shrug  were  normal and symmetric. Motor: The motor testing reveals 5 over 5 strength of all 4 extremities. Good symmetric motor tone is noted throughout.  Gait and station: Gait is stable without assistive device    DIAGNOSTIC DATA (LABS, IMAGING, TESTING) - I reviewed patient records, labs, notes, testing and imaging myself where available.  Lab Results  Component Value Date   WBC 6.8 01/02/2020   HGB 12.5 01/02/2020   HCT 37.8 01/02/2020   MCV 82 01/02/2020   PLT 332 01/02/2020      Component Value Date/Time   NA 141 01/02/2020 1448   K 4.5 01/02/2020 1448   CL 104 01/02/2020 1448   CO2 24 01/02/2020 1448   GLUCOSE 109 (H) 01/02/2020 1448   GLUCOSE 95 11/02/2017 1117   BUN 21 01/02/2020 1448   CREATININE 1.17 (H) 01/02/2020 1448   CALCIUM 9.4 01/02/2020 1448   PROT 6.8 01/02/2020 1448   ALBUMIN 4.7 01/02/2020 1448   AST 17 01/02/2020 1448   ALT 9 01/02/2020 1448   ALKPHOS 78 01/02/2020 1448   BILITOT 0.3 01/02/2020 1448   GFRNONAA 48 (L) 01/02/2020 1448   GFRAA 55 (L) 01/02/2020 1448   Lab Results  Component Value Date   CHOL 228 (H) 09/01/2012   HDL 60.00 09/01/2012   LDLDIRECT 146.4 09/01/2012   TRIG 167.0 (H) 09/01/2012   CHOLHDL 4 09/01/2012   Lab Results  Component Value Date   HGBA1C 5.7 09/01/2012   Lab Results  Component Value Date   VITAMINB12 507 10/02/2014   Lab Results  Component Value Date   TSH 0.560 01/02/2020      02/23/2024    2:00 PM 08/11/2023    8:44 AM 10/05/2022    2:11 PM  MMSE - Mini Mental State Exam  Orientation to time 4 4 4   Orientation to Place 5 5 5   Registration 3 3 3   Attention/ Calculation 2 1 4   Recall 3 3 3   Language- name 2 objects 2 2 2   Language- repeat 1 1 1   Language- follow 3 step command 3 3 3   Language- read & follow direction 1 1 1   Write a sentence 1 1 1   Copy design 1 1 1   Total score 26 25 28      ASSESSMENT AND PLAN  73 y.o. 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 here with  Memory difficulties  Depression with anxiety  Attention deficit  White matter abnormality on MRI of brain  Journii is doing well, today. Methylphenidate  discontinued 6-8 months ago as she did not feel it was needed. She continues to have some insomnia. She was diagnosed with sleep apnea and followed by [facility]. She has follow up scheduled 04/2024 to discuss treatment options. She feels memory is stable. MMSE 26/30. Previously 25/30. Will continue to monitor. Sleep hygiene discussed. Consider melatonin. She will continue healthy lifestyle habits. Continue close follow up with PCP. Follow up in 6 months with Dr Vear. She verbalizes understanding and agreement with this plan.    No orders of the defined types were placed in this encounter.  No orders of the defined types were placed in this encounter.   I spent 30 minutes of face-to-face and non-face-to-face time with patient.  This included previsit chart review, lab review, study review, order entry, electronic health record documentation, patient education.   Greig Forbes, MSN, FNP-C 02/23/2024, 2:48 PM  [facility] [address], [address] [place], [place] [address] [phone]

## 2024-02-23 NOTE — Patient Instructions (Addendum)
 Below is our plan:  We will continue to monitor. Consider trying melatonin 5-10mg  with ashwaganda 300mg  at bedtime.   Please make sure you are staying well hydrated. I recommend 50-60 ounces daily. Well balanced diet and regular exercise encouraged. Consistent sleep schedule with 6-8 hours recommended.   Please continue follow up with care team as directed.   Follow up with Dr Vear in 6 months   You may receive a survey regarding today's visit. I encourage you to leave honest feed back as I do use this information to improve patient care. Thank you for seeing me today!   Management of Memory Problems   There are some general things you can do to help manage your memory problems.  Your memory may not in fact recover, but by using techniques and strategies you will be able to manage your memory difficulties better.   1)  Establish a routine. Try to establish and then stick to a regular routine.  By doing this, you will get used to what to expect and you will reduce the need to rely on your memory.  Also, try to do things at the same time of day, such as taking your medication or checking your calendar first thing in the morning. Think about think that you can do as a part of a regular routine and make a list.  Then enter them into a daily planner to remind you.  This will help you establish a routine.   2)  Organize your environment. Organize your environment so that it is uncluttered.  Decrease visual stimulation.  Place everyday items such as keys or cell phone in the same place every day (ie.  Basket next to front door) Use post it notes with a brief message to yourself (ie. Turn off light, lock the door) Use labels to indicate where things go (ie. Which cupboards are for food, dishes, etc.) Keep a notepad and pen by the telephone to take messages   3)  Memory Aids A diary or journal/notebook/daily planner Making a list (shopping list, chore list, to do list that needs to be  done) Using an alarm as a reminder (kitchen timer or cell phone alarm) Using cell phone to store information (Notes, Calendar, Reminders) Calendar/White board placed in a prominent position Post-it notes   In order for memory aids to be useful, you need to have good habits.  It's no good remembering to make a note in your journal if you don't remember to look in it.  Try setting aside a certain time of day to look in journal.   4)  Improving mood and managing fatigue. There may be other factors that contribute to memory difficulties.  Factors, such as anxiety, depression and tiredness can affect memory. Regular gentle exercise can help improve your mood and give you more energy. Exercise: there are short videos created by the [employer] on Health specially for older adults: https://bit.ly/2I30q97.  Mediterranean diet: which emphasizes fruits, vegetables, whole grains, legumes, fish, and other seafood; unsaturated fats such as olive oils; and low amounts of red meat, eggs, and sweets. A variation of this, called MIND (Mediterranean-DASH Intervention for Neurodegenerative Delay) incorporates the DASH (Dietary Approaches to Stop Hypertension) diet, which has been shown to lower high blood pressure, a risk factor for Alzheimer's disease. More information at: ExitMarketing.de.  Aerobic exercise that improve heart health is also good for the mind.  [employer] on Aging have short videos for exercises that you can do at home:  BlindWorkshop.com.pt Simple relaxation techniques may help relieve symptoms of anxiety Try to get back to completing activities or hobbies you enjoyed doing in the past. Learn to pace yourself through activities to decrease fatigue. Find out about some local support groups where you can share experiences with others. Try and achieve 7-8 hours of sleep at night.   Tasks to improve  attention/working memory 1. Good sleep hygiene (7-8 hrs of sleep) 2. Learning a new skill (Painting, Carpentry, Pottery, new language, Knitting). 3.Cognitive exercises (keep a daily journal, Puzzles) 4. Physical exercise and training  (30 min/day X 4 days week) 5. Being on Antidepressant if needed 6.Yoga, Meditation, Tai Chi 7. Decrease alcohol intake 8.Have a clear schedule and structure in daily routine   MIND Diet: The Mediterranean-DASH Diet Intervention for Neurodegenerative Delay, or MIND diet, targets the health of the aging brain. Research participants with the highest MIND diet scores had a significantly slower rate of cognitive decline compared with those with the lowest scores. The effects of the MIND diet on cognition showed greater effects than either the Mediterranean or the DASH diet alone.   The healthy items the MIND diet guidelines suggest include:   3+ servings a day of whole grains 1+ servings a day of vegetables (other than green leafy) 6+ servings a week of green leafy vegetables 5+ servings a week of nuts 4+ meals a week of beans 2+ servings a week of berries 2+ meals a week of poultry 1+ meals a week of fish Mainly olive oil if added fat is used   The unhealthy items, which are higher in saturated and trans fat, include: Less than 5 servings a week of pastries and sweets Less than 4 servings a week of red meat (including beef, pork, lamb, and products made from these meats) Less than one serving a week of cheese and fried foods Less than 1 tablespoon a day of butter/stick margarine

## 2024-02-28 ENCOUNTER — Encounter: Payer: Self-pay | Admitting: Family Medicine

## 2024-02-29 NOTE — Telephone Encounter (Signed)
 Put pt on wait list for Dr. Vear

## 2024-03-14 ENCOUNTER — Encounter: Payer: Self-pay | Admitting: Internal Medicine

## 2024-03-14 DIAGNOSIS — G4733 Obstructive sleep apnea (adult) (pediatric): Secondary | ICD-10-CM

## 2024-03-14 DIAGNOSIS — J849 Interstitial pulmonary disease, unspecified: Secondary | ICD-10-CM

## 2024-03-16 NOTE — Telephone Encounter (Signed)
 Please order overnight oximetry to be done while wearing CPAP. I know this won't be enough to qualify her for O2, but it would give me an idea how she is doing.

## 2024-03-16 NOTE — Telephone Encounter (Signed)
 Please advise I think pt is requesting possibly an ONO.

## 2024-03-21 ENCOUNTER — Other Ambulatory Visit: Payer: Self-pay | Admitting: *Deleted

## 2024-03-21 ENCOUNTER — Ambulatory Visit: Payer: Self-pay

## 2024-03-21 MED ORDER — PREDNISONE 10 MG PO TABS
ORAL_TABLET | ORAL | 0 refills | Status: DC
Start: 2024-03-21 — End: 2024-03-28

## 2024-03-21 MED ORDER — PREDNISONE 10 MG PO TABS: ORAL_TABLET | ORAL | 0 refills | Status: DC

## 2024-03-21 NOTE — Progress Notes (Deleted)
 Good morning, It looks like you had the sleep study through [facility].  Have they ordered you a CPAP machine?  Have you received the CPAP machine?  What durable medical supply company are you using?  We can have the same durable medical supply company do the overnight oximetry test.  Once we hear back from you we will order the test.  Thank you and have a good day.

## 2024-03-21 NOTE — Telephone Encounter (Signed)
 FYI Only or Action Required?: Action required by provider: clinical question for provider.  Patient is followed in Pulmonology for COPD, last seen on 08/01/2023 by Neysa Reggy BIRCH, MD.  Called Nurse Triage reporting Shortness of Breath.  Symptoms began today.  Interventions attempted: Other: pt went to UC today.  Symptoms are: unchanged.  Triage Disposition: See HCP Within 4 Hours (Or PCP Triage)  Patient/caregiver understands and will follow disposition?: No, wishes to speak with PCP   Pt went to UC in [place] today. They advised breathing treatments and prednisone . She states she can't do the breathing tx due to anxiety and told them she didn't want the prednisone . She states since returning home she feels like it is getting back to where it was the am. She states she doesn't feel like her inhaler is opening her up enough. She states she doesn't really want to drive back to [place]. She is asking if she should take a higher dose of prednisone  to help her through this.      Copied from CRM #[id number]. Topic: Clinical - Red Word Triage >> Mar 21, 2024  2:33 PM Rozanna MATSU wrote: Red Word that prompted transfer to Nurse Triage: wheezing, tightness in her chest Reason for Disposition  [1] Longstanding difficulty breathing (e.g., CHF, COPD, emphysema) AND [2] WORSE than normal  Answer Assessment - Initial Assessment Questions Wheezing in the throat, albuterol  and gerd, UC in [place],  COPD flare up, but couldn't do the treatment because it gives her anxiety Albuterol  inhaler every 6 hours, and offered prednisone  but pt declined  Felt like chest is tight again, no having wheezing currently    1. RESPIRATORY STATUS: Describe your breathing? (e.g., wheezing, shortness of breath, unable to speak, severe coughing)      Feels chest tightness 2. ONSET: When did this breathing problem begin?      This am  3. PATTERN Does the difficult breathing come and go, or has it  been constant since it started?      intermittent 4. SEVERITY: How bad is your breathing? (e.g., mild, moderate, severe)      Not real bad but uncomfortable.  5. RECURRENT SYMPTOM: Have you had difficulty breathing before? If Yes, ask: When was the last time? and What happened that time?      Yes but typically chest tightness  6. CARDIAC HISTORY: Do you have any history of heart disease? (e.g., heart attack, angina, bypass surgery, angioplasty)      no 7. LUNG HISTORY: Do you have any history of lung disease?  (e.g., pulmonary embolus, asthma, emphysema)     COPD 8. CAUSE: What do you think is causing the breathing problem?       9. OTHER SYMPTOMS: Do you have any other symptoms? (e.g., chest pain, cough, dizziness, fever, runny nose)     no 10. O2 SATURATION MONITOR:  Do you use an oxygen saturation monitor (pulse oximeter) at home? If Yes, ask: What is your reading (oxygen level) today? What is your usual oxygen saturation reading? (e.g., 95%)       Unable to check  Protocols used: Breathing Difficulty-A-AH

## 2024-03-21 NOTE — Telephone Encounter (Signed)
 RX has been printed and signed by Landry Ferrari, NP. I called and spoke to pt's husband, DPR. Husband asked if this could be sent to [facility] [address]. This has been faxed. NFN

## 2024-03-21 NOTE — Telephone Encounter (Signed)
 I can send in prednisone  taper for her

## 2024-03-21 NOTE — Telephone Encounter (Signed)
 Patient was seen at the [facility] earlier today and was diagnosed with a COPD exacerbation.  They wanted her to do a breathing treatment with Albuterol , but she declined because it gives her panic attacks.  They wanted to prescribe prednisone  and she declined that as well.  She decided she needed the prednisone  and tried to call [facility] back, but could not get anyone on the phone.  She was told to use her Albuterol  inhaler every 6 hours as needed for sob and use her Anoro as she has been using it at bedtime since she had declined the treatment recommended.  She called our office wanting prednisone  prescribed. I advised her that Dr. Neysa was not in office today, however I would send a message to the app of the day and once we heard back from them we would call her back.  Verbalized understanding.  Beth, See triage message.  Patient now requesting prednisone .  Please advise.

## 2024-03-28 ENCOUNTER — Other Ambulatory Visit: Payer: Self-pay | Admitting: *Deleted

## 2024-03-28 MED ORDER — PREDNISONE 10 MG PO TABS: ORAL_TABLET | ORAL | 0 refills | Status: AC

## 2024-04-04 ENCOUNTER — Encounter: Payer: Self-pay | Admitting: Internal Medicine

## 2024-04-05 NOTE — Telephone Encounter (Signed)
 Unless your breathing is really bad, I suggest you set the prednisone  aside for now and don't take it unless you really need it. I'm not clear what you are doing with the resin. Are you heating it or burning it so it smokes? I would think it might be ok to work with the resin if you can wear a mask and do it outside.

## 2024-04-05 NOTE — Telephone Encounter (Signed)
**Note De-identified  [place] Obfuscation** Please advise 

## 2024-04-09 NOTE — Telephone Encounter (Signed)
 Continue long-term maintenance prednisone  dose. If working with resin or anything irritating to your lungs, you need to be outside or where there is really good ventilation.

## 2024-04-11 ENCOUNTER — Encounter: Payer: Self-pay | Admitting: Internal Medicine

## 2024-04-12 NOTE — Telephone Encounter (Signed)
 Any recommendations?

## 2024-04-12 NOTE — Telephone Encounter (Signed)
**Note De-identified  [place] Obfuscation** Please advise 

## 2024-04-12 NOTE — Telephone Encounter (Signed)
 Don't take the prednisone  now if your breathing is fine. You can keep it for when you need it.  If you have a protective mask, then it may be a good idea to use it when you work with the resin.

## 2024-04-20 NOTE — Telephone Encounter (Signed)
 Regular surgical mask from drug store, etc, should be fine. More important is that work place be well-ventilated.

## 2024-05-08 ENCOUNTER — Telehealth: Payer: Self-pay | Admitting: Internal Medicine

## 2024-05-08 ENCOUNTER — Encounter: Payer: Self-pay | Admitting: Neurology

## 2024-05-08 NOTE — Telephone Encounter (Signed)
 Copied from CRM [id number]. Topic: General - Other >> May 08, 2024 12:45 PM Whitney O wrote: Reason for CRM: patient is calling because she wants dr young refer her to a pulmonary dr in [place] . Please give patient a call concerning a pulmonary doctor in [place]  [phone]

## 2024-05-09 ENCOUNTER — Other Ambulatory Visit: Payer: Self-pay | Admitting: Neurology

## 2024-05-09 MED ORDER — METHYLPHENIDATE HCL 10 MG PO TABS: ORAL_TABLET | ORAL | 0 refills | Status: DC

## 2024-05-09 NOTE — Telephone Encounter (Signed)
 Dr Neysa, is this okay?

## 2024-05-10 NOTE — Telephone Encounter (Signed)
 Certainly fine to refer. I don't know those doctors. If she doesn't have a specific request, then ok to call [facility] referral service and ask them to schedule.

## 2024-05-15 ENCOUNTER — Encounter: Payer: Self-pay | Admitting: Internal Medicine

## 2024-05-25 NOTE — Telephone Encounter (Signed)
 Called and spoke with the pt and advised of Dr. Neysa message. Pt states she needs medical info sent to [facility]. Routing to [place] to handle this as I am unsure of how this needs to be sent.

## 2024-06-03 ENCOUNTER — Encounter: Payer: Self-pay | Admitting: Internal Medicine

## 2024-06-05 NOTE — Telephone Encounter (Signed)
 After you eat do you feel heartburn/ acid? Do you feel as if food hang up and won't go down?  Suggest you try taking otc pepcid or omeprazole  twice daily- before breakfast and before supper, for 10 days. See if this helps the problem.

## 2024-06-05 NOTE — Telephone Encounter (Signed)
 Amy, Have you received the ONO results on this patient?  She is requesting the results, however I do not see that we have received them.  Thank you.

## 2024-06-06 ENCOUNTER — Telehealth: Payer: Self-pay

## 2024-06-06 NOTE — Telephone Encounter (Signed)
 I left a voicemail over to Dolanda with [employer]. Provided my direct line and email if she would like to send me the ono results.

## 2024-06-06 NOTE — Telephone Encounter (Addendum)
 Hello [facility] team!  Can you please check on status of ONO?  Patient is requesting results.  It looks like this was ordered on 03/16/2024.  Thank you.  DME should be [employer].

## 2024-06-15 NOTE — Telephone Encounter (Signed)
 Spoke to [place], he will email me the results. I'll send you the results through email aswell

## 2024-06-15 NOTE — Telephone Encounter (Signed)
 Hi!!  Could you check on the status of this ONO report?  Thank you!

## 2024-06-16 ENCOUNTER — Telehealth: Payer: Self-pay | Admitting: Emergency Medicine

## 2024-06-16 NOTE — Telephone Encounter (Signed)
 She began to have SOB starting last night - woke her from sleep, some mucous and irritation in her throat. She called EMS last night, was evaluated and did not have any desaturation. She was able to get back to sleep. Feels better but not back to her baseline. She wonders whether she had an episode of GERD with associated UA irritation. No cough, no wheeze. She is on Anoro, tried albuterol  without any effect.  She is on pantoprazole  for last few months, also on famotidine. She is on chronic prednisone .   I will hold off on any increase in Prednisone  for now. Increase her PPI to BID for 5 days, continue the famotidine. Have recommended that she sleep w her head elevated. Continue the Anoro.   If she worsens or has progressive sx then have recommended that she go to ED or call EMS.

## 2024-06-19 ENCOUNTER — Telehealth: Payer: Self-pay | Admitting: Neurology

## 2024-06-19 NOTE — Telephone Encounter (Signed)
 Amber from [facility] called ti follow up about request form for Pt to stop medication , that was faxed to our offcie  , Informed I do not see a note stating that   form was received , and requested for her to  refax form . Gave Fax number  to [facility] . Pt at Procedure on the 8th , However prior to  Procedure Pt is suppose to stop medication clopidogrel  (PLAVIX ) 75 MG tablet  for 4 days .   Callback number is  [phone] ex [phone] Fax-[phone]

## 2024-06-21 ENCOUNTER — Ambulatory Visit: Payer: Medicare (Managed Care) | Admitting: Adult Health

## 2024-06-21 NOTE — Telephone Encounter (Signed)
" °  Form was faxed to # listed confirmation received. "

## 2024-06-22 ENCOUNTER — Encounter: Payer: Self-pay | Admitting: Pulmonary Disease

## 2024-06-22 NOTE — Telephone Encounter (Signed)
 Received ONO report email test date 05/23/2024.  Printed report and gave to Dr. Neda for review.  Placed in Dr. Cathye review folder.

## 2024-06-22 NOTE — Telephone Encounter (Signed)
 Per Dr. Neda:  Neda Jennet LABOR, MD to [name]     06/22/24 12:28 PM Your overnight oximetry did not show any significant desaturations with your CPAP in place   No need for oxygen supplementation with CPAP in place   Called patient.  Reviewed all information per Dr. Neda.  Patient verbalized understanding.

## 2024-06-28 ENCOUNTER — Encounter: Payer: Self-pay | Admitting: Internal Medicine

## 2024-06-30 ENCOUNTER — Encounter: Payer: Self-pay | Admitting: Neurology

## 2024-07-10 MED ORDER — METHYLPHENIDATE HCL 10 MG PO TABS: ORAL_TABLET | ORAL | 0 refills | Status: AC

## 2024-07-10 NOTE — Telephone Encounter (Signed)
 Requested Prescriptions   Pending Prescriptions Disp Refills   methylphenidate  (RITALIN ) 10 MG tablet 60 tablet 0    Sig: One po qAM and one po 4 hours later   Last seen 02/23/24 Next appt 09/27/24 Dispenses   Dispensed Days Supply Quantity Provider Pharmacy  METHYLPHENIDATE  10 MG TABLET 05/09/2024 30 60 each Vear Charlie LABOR, MD [facility] [phone] - [address]  METHYLPHENIDATE  10 MG TABLET 07/14/2023 30 30 each Lomax, Amy, NP [facility] [phone] - [email or web address]

## 2024-09-27 ENCOUNTER — Ambulatory Visit: Payer: Medicare (Managed Care) | Admitting: Neurology
